# Patient Record
Sex: Female | Born: 1950 | Race: Black or African American | Hispanic: No | Smoking: Former smoker
Health system: Southern US, Community
[De-identification: ages and names within clinical notes are randomized; demographics above are authoritative.]

## PROBLEM LIST (undated history)

## (undated) DIAGNOSIS — E7800 Pure hypercholesterolemia, unspecified: Secondary | ICD-10-CM

## (undated) DIAGNOSIS — I6319 Cerebral infarction due to embolism of other precerebral artery: Secondary | ICD-10-CM

## (undated) DIAGNOSIS — IMO0001 Reserved for inherently not codable concepts without codable children: Secondary | ICD-10-CM

## (undated) DIAGNOSIS — N189 Chronic kidney disease, unspecified: Secondary | ICD-10-CM

## (undated) DIAGNOSIS — R609 Edema, unspecified: Secondary | ICD-10-CM

## (undated) DIAGNOSIS — I2510 Atherosclerotic heart disease of native coronary artery without angina pectoris: Secondary | ICD-10-CM

## (undated) DIAGNOSIS — N184 Chronic kidney disease, stage 4 (severe): Secondary | ICD-10-CM

## (undated) DIAGNOSIS — Z952 Presence of prosthetic heart valve: Secondary | ICD-10-CM

## (undated) DIAGNOSIS — I739 Peripheral vascular disease, unspecified: Secondary | ICD-10-CM

## (undated) DIAGNOSIS — I4821 Permanent atrial fibrillation: Secondary | ICD-10-CM

## (undated) DIAGNOSIS — F419 Anxiety disorder, unspecified: Secondary | ICD-10-CM

## (undated) DIAGNOSIS — I7143 Infrarenal abdominal aortic aneurysm, without rupture: Secondary | ICD-10-CM

## (undated) DIAGNOSIS — K219 Gastro-esophageal reflux disease without esophagitis: Secondary | ICD-10-CM

## (undated) DIAGNOSIS — Z8719 Personal history of other diseases of the digestive system: Secondary | ICD-10-CM

## (undated) DIAGNOSIS — Z923 Personal history of irradiation: Secondary | ICD-10-CM

## (undated) DIAGNOSIS — I719 Aortic aneurysm of unspecified site, without rupture: Secondary | ICD-10-CM

## (undated) DIAGNOSIS — D631 Anemia in chronic kidney disease: Secondary | ICD-10-CM

## (undated) DIAGNOSIS — G4730 Sleep apnea, unspecified: Secondary | ICD-10-CM

## (undated) DIAGNOSIS — M109 Gout, unspecified: Secondary | ICD-10-CM

## (undated) DIAGNOSIS — Z7901 Long term (current) use of anticoagulants: Secondary | ICD-10-CM

## (undated) DIAGNOSIS — R42 Dizziness and giddiness: Secondary | ICD-10-CM

## (undated) DIAGNOSIS — I351 Nonrheumatic aortic (valve) insufficiency: Secondary | ICD-10-CM

## (undated) DIAGNOSIS — I700 Atherosclerosis of aorta: Secondary | ICD-10-CM

## (undated) DIAGNOSIS — M1990 Unspecified osteoarthritis, unspecified site: Secondary | ICD-10-CM

## (undated) DIAGNOSIS — I10 Essential (primary) hypertension: Secondary | ICD-10-CM

## (undated) DIAGNOSIS — I714 Abdominal aortic aneurysm, without rupture: Secondary | ICD-10-CM

## (undated) DIAGNOSIS — I5030 Unspecified diastolic (congestive) heart failure: Secondary | ICD-10-CM

## (undated) DIAGNOSIS — I723 Aneurysm of iliac artery: Secondary | ICD-10-CM

## (undated) HISTORY — PX: REPLACEMENT TOTAL KNEE BILATERAL: SUR1225

## (undated) HISTORY — PX: ABDOMINAL HYSTERECTOMY: SHX81

## (undated) HISTORY — PX: CARDIAC CATHETERIZATION: SHX172

## (undated) HISTORY — DX: Chronic kidney disease, unspecified: N18.9

## (undated) HISTORY — PX: JOINT REPLACEMENT: SHX530

## (undated) HISTORY — DX: Anemia in chronic kidney disease: D63.1

## (undated) HISTORY — DX: Gastro-esophageal reflux disease without esophagitis: K21.9

## (undated) HISTORY — PX: CARDIAC VALVE REPLACEMENT: SHX585

## (undated) HISTORY — PX: AORTIC VALVE REPLACEMENT: SHX41

## (undated) SURGERY — CARDIOVERSION (CATH LAB)
Anesthesia: Monitor Anesthesia Care

---

## 2002-12-12 DIAGNOSIS — I7103 Dissection of thoracoabdominal aorta: Secondary | ICD-10-CM

## 2002-12-12 HISTORY — DX: Dissection of thoracoabdominal aorta: I71.03

## 2003-06-08 HISTORY — PX: REPAIR OF ACUTE ASCENDING THORACIC AORTIC DISSECTION: SHX6323

## 2003-09-19 HISTORY — PX: AORTIC VALVE REPLACEMENT: SHX41

## 2005-06-11 ENCOUNTER — Emergency Department: Payer: Self-pay | Admitting: Emergency Medicine

## 2005-09-09 ENCOUNTER — Ambulatory Visit: Payer: Self-pay | Admitting: Oncology

## 2005-11-23 ENCOUNTER — Ambulatory Visit: Payer: Self-pay | Admitting: Oncology

## 2006-03-31 ENCOUNTER — Ambulatory Visit: Payer: Self-pay | Admitting: *Deleted

## 2006-04-03 ENCOUNTER — Ambulatory Visit: Payer: Self-pay | Admitting: *Deleted

## 2006-06-25 ENCOUNTER — Emergency Department: Payer: Self-pay | Admitting: Internal Medicine

## 2007-04-24 ENCOUNTER — Ambulatory Visit: Payer: Self-pay | Admitting: Internal Medicine

## 2008-01-15 ENCOUNTER — Ambulatory Visit: Payer: Self-pay | Admitting: Internal Medicine

## 2008-05-29 ENCOUNTER — Ambulatory Visit: Payer: Self-pay | Admitting: Internal Medicine

## 2009-06-02 ENCOUNTER — Ambulatory Visit: Payer: Self-pay | Admitting: Internal Medicine

## 2010-07-28 ENCOUNTER — Ambulatory Visit: Payer: Self-pay | Admitting: Internal Medicine

## 2010-09-20 ENCOUNTER — Ambulatory Visit: Payer: Self-pay | Admitting: Specialist

## 2010-09-29 ENCOUNTER — Inpatient Hospital Stay: Payer: Self-pay | Admitting: Specialist

## 2010-11-10 ENCOUNTER — Encounter: Payer: Self-pay | Admitting: Specialist

## 2010-11-11 ENCOUNTER — Encounter: Payer: Self-pay | Admitting: Specialist

## 2010-12-12 ENCOUNTER — Encounter: Payer: Self-pay | Admitting: Specialist

## 2011-02-02 ENCOUNTER — Ambulatory Visit: Payer: Self-pay | Admitting: Gastroenterology

## 2011-09-13 ENCOUNTER — Ambulatory Visit: Payer: Self-pay | Admitting: Internal Medicine

## 2012-02-28 ENCOUNTER — Ambulatory Visit: Payer: Self-pay | Admitting: Specialist

## 2012-02-28 LAB — PROTIME-INR
INR: 1.8
Prothrombin Time: 21.6 secs — ABNORMAL HIGH (ref 11.5–14.7)

## 2012-02-28 LAB — BASIC METABOLIC PANEL
Anion Gap: 10 (ref 7–16)
BUN: 31 mg/dL — ABNORMAL HIGH (ref 7–18)
Calcium, Total: 9.4 mg/dL (ref 8.5–10.1)
Chloride: 107 mmol/L (ref 98–107)
Co2: 26 mmol/L (ref 21–32)
Creatinine: 1.23 mg/dL (ref 0.60–1.30)
EGFR (African American): 57 — ABNORMAL LOW

## 2012-02-28 LAB — URINALYSIS, COMPLETE
Blood: NEGATIVE
Ketone: NEGATIVE
Ph: 5 (ref 4.5–8.0)
Protein: NEGATIVE
RBC,UR: <1 /HPF (ref 0–5)
Specific Gravity: 1.014 (ref 1.003–1.030)
WBC UR: <1 /HPF (ref 0–5)

## 2012-02-28 LAB — CBC
HCT: 32.2 % — ABNORMAL LOW (ref 35.0–47.0)
HGB: 10.3 g/dL — ABNORMAL LOW (ref 12.0–16.0)
MCH: 27.7 pg (ref 26.0–34.0)
MCHC: 31.8 g/dL — ABNORMAL LOW (ref 32.0–36.0)
MCV: 87 fL (ref 80–100)
RBC: 3.7 10*6/uL — ABNORMAL LOW (ref 3.80–5.20)
WBC: 4.9 10*3/uL (ref 3.6–11.0)

## 2012-02-28 LAB — MRSA PCR SCREENING

## 2012-03-06 ENCOUNTER — Inpatient Hospital Stay: Payer: Self-pay | Admitting: Specialist

## 2012-03-07 LAB — URINALYSIS, COMPLETE
Bacteria: NONE SEEN
Ketone: NEGATIVE
Leukocyte Esterase: NEGATIVE
Ph: 5 (ref 4.5–8.0)
Protein: NEGATIVE
RBC,UR: 50 /HPF (ref 0–5)
Specific Gravity: 1.013 (ref 1.003–1.030)
Squamous Epithelial: NONE SEEN

## 2012-03-07 LAB — CBC WITH DIFFERENTIAL/PLATELET
Basophil %: 0.6 %
Eosinophil #: 0 10*3/uL (ref 0.0–0.7)
Eosinophil %: 0.2 %
Lymphocyte #: 0.9 10*3/uL — ABNORMAL LOW (ref 1.0–3.6)
MCH: 27.8 pg (ref 26.0–34.0)
MCV: 87 fL (ref 80–100)
Neutrophil #: 7.7 10*3/uL — ABNORMAL HIGH (ref 1.4–6.5)
Neutrophil %: 79.2 %
WBC: 9.7 10*3/uL (ref 3.6–11.0)

## 2012-03-07 LAB — BASIC METABOLIC PANEL
Anion Gap: 13 (ref 7–16)
Co2: 22 mmol/L (ref 21–32)
EGFR (African American): 27 — ABNORMAL LOW
Sodium: 140 mmol/L (ref 136–145)

## 2012-03-07 LAB — PROTIME-INR
INR: 1.1
Prothrombin Time: 14.4 secs (ref 11.5–14.7)

## 2012-03-08 LAB — HEMOGLOBIN: HGB: 7.4 g/dL — ABNORMAL LOW (ref 12.0–16.0)

## 2012-03-08 LAB — PROTIME-INR
INR: 1.2
Prothrombin Time: 16 secs — ABNORMAL HIGH (ref 11.5–14.7)

## 2012-03-08 LAB — BASIC METABOLIC PANEL
Anion Gap: 12 (ref 7–16)
BUN: 32 mg/dL — ABNORMAL HIGH (ref 7–18)
Calcium, Total: 8.2 mg/dL — ABNORMAL LOW (ref 8.5–10.1)
Chloride: 103 mmol/L (ref 98–107)
Glucose: 107 mg/dL — ABNORMAL HIGH (ref 65–99)
Osmolality: 279 (ref 275–301)
Potassium: 3.9 mmol/L (ref 3.5–5.1)
Sodium: 136 mmol/L (ref 136–145)

## 2012-03-09 LAB — BASIC METABOLIC PANEL
Anion Gap: 11 (ref 7–16)
BUN: 23 mg/dL — ABNORMAL HIGH (ref 7–18)
Calcium, Total: 8.6 mg/dL (ref 8.5–10.1)
Chloride: 103 mmol/L (ref 98–107)
Co2: 23 mmol/L (ref 21–32)
EGFR (African American): 51 — ABNORMAL LOW
Glucose: 94 mg/dL (ref 65–99)
Osmolality: 277 (ref 275–301)
Potassium: 4.1 mmol/L (ref 3.5–5.1)
Sodium: 137 mmol/L (ref 136–145)

## 2012-03-09 LAB — CBC WITH DIFFERENTIAL/PLATELET
Basophil #: 0 10*3/uL (ref 0.0–0.1)
Basophil %: 0.2 %
Eosinophil %: 0.1 %
HCT: 26.4 % — ABNORMAL LOW (ref 35.0–47.0)
Lymphocyte #: 0.7 10*3/uL — ABNORMAL LOW (ref 1.0–3.6)
Lymphocyte %: 5.5 %
MCH: 29.5 pg (ref 26.0–34.0)
Monocyte #: 1.9 10*3/uL — ABNORMAL HIGH (ref 0.0–0.7)
Neutrophil #: 10.9 10*3/uL — ABNORMAL HIGH (ref 1.4–6.5)
Neutrophil %: 80.2 %
Platelet: 142 10*3/uL — ABNORMAL LOW (ref 150–440)
RBC: 2.97 10*6/uL — ABNORMAL LOW (ref 3.80–5.20)
RDW: 15.5 % — ABNORMAL HIGH (ref 11.5–14.5)
WBC: 13.5 10*3/uL — ABNORMAL HIGH (ref 3.6–11.0)

## 2012-03-09 LAB — PROTIME-INR
INR: 1.3
Prothrombin Time: 16.6 secs — ABNORMAL HIGH (ref 11.5–14.7)

## 2012-03-10 LAB — CBC WITH DIFFERENTIAL/PLATELET
Basophil %: 0 %
Eosinophil #: 0 10*3/uL (ref 0.0–0.7)
HCT: 25.2 % — ABNORMAL LOW (ref 35.0–47.0)
HGB: 8.4 g/dL — ABNORMAL LOW (ref 12.0–16.0)
Lymphocyte %: 6.3 %
MCH: 29.3 pg (ref 26.0–34.0)
MCHC: 33.3 g/dL (ref 32.0–36.0)
Monocyte #: 1.2 10*3/uL — ABNORMAL HIGH (ref 0.0–0.7)
Monocyte %: 9.3 %
Platelet: 179 10*3/uL (ref 150–440)
RBC: 2.86 10*6/uL — ABNORMAL LOW (ref 3.80–5.20)
WBC: 13.2 10*3/uL — ABNORMAL HIGH (ref 3.6–11.0)

## 2012-03-10 LAB — BASIC METABOLIC PANEL
BUN: 23 mg/dL — ABNORMAL HIGH (ref 7–18)
Calcium, Total: 8.8 mg/dL (ref 8.5–10.1)
Co2: 25 mmol/L (ref 21–32)
Creatinine: 1.32 mg/dL — ABNORMAL HIGH (ref 0.60–1.30)
EGFR (Non-African Amer.): 43 — ABNORMAL LOW
Osmolality: 279 (ref 275–301)
Sodium: 138 mmol/L (ref 136–145)

## 2012-04-04 ENCOUNTER — Encounter: Payer: Self-pay | Admitting: Specialist

## 2012-04-11 ENCOUNTER — Encounter: Payer: Self-pay | Admitting: Specialist

## 2012-09-13 ENCOUNTER — Ambulatory Visit: Payer: Self-pay | Admitting: Internal Medicine

## 2013-01-15 ENCOUNTER — Ambulatory Visit: Payer: Self-pay | Admitting: Cardiovascular Disease

## 2013-01-15 LAB — PROTIME-INR
INR: 2.9
Prothrombin Time: 30.8 secs — ABNORMAL HIGH (ref 11.5–14.7)

## 2013-09-17 ENCOUNTER — Ambulatory Visit: Payer: Self-pay | Admitting: Internal Medicine

## 2013-10-04 ENCOUNTER — Ambulatory Visit: Payer: Self-pay | Admitting: Internal Medicine

## 2014-09-05 ENCOUNTER — Ambulatory Visit: Payer: Self-pay | Admitting: Internal Medicine

## 2014-09-11 ENCOUNTER — Ambulatory Visit: Payer: Self-pay | Admitting: Internal Medicine

## 2015-03-17 ENCOUNTER — Ambulatory Visit
Admit: 2015-03-17 | Disposition: A | Discharge status: Home / Self Care | Payer: Self-pay | Attending: Internal Medicine | Admitting: Internal Medicine

## 2015-04-05 NOTE — Discharge Summary (Signed)
PATIENT NAME:  Jeanette Romero, Jeanette Romero MR#:  [id number] DATE OF BIRTH:  June 07, 1951  DATE OF ADMISSION:  03/06/2012 DATE OF DISCHARGE:  03/11/2012  FINAL DIAGNOSES:  1. Osteoarthritis, right knee. 2. 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  3. Hypertension.  4. Lower extremity stent.  OPERATIONS: 03/06/2012 cemented DePuy rotating platform LCS total knee replacement.   COMPLICATIONS: None.   CONSULTATIONS: [facility] for low blood pressure postoperatively.   DISCHARGE MEDICATIONS:  1. Home medications as prior to admission except for Warfarin at 4 milligrams daily.  2. Norco 5/325 q.4-6h. p.r.n. pain   HISTORY OF PRESENT ILLNESS: The patient is a 64 year old female with advanced osteoarthritis of the right knee. She had a previous left total knee replacement. The arthritis progressed in the right knee despite bracing and injections to the point where she had constant daily pain which interfered with her activities greatly. She wished to proceed with knee replacement surgery. The patient's cardiologist Dr. Neoma Laming arrange for her to stop her Coumadin five days prior to surgery and placed her on Lovenox as a bridge prior to surgery.   PAST MEDICAL HISTORY: Illnesses as above.   MEDICATIONS: Prehospital medications included warfarin 5 mg daily, Vicodin for pain, Lipitor 40 mg daily, clonidine 0.3 mg t.i.d., Celebrex 200 mg orally daily, benazepril 1 tablet daily, atenolol 25 mg daily, 81 mg aspirin 1 daily, amlodipine 10 mg daily, alprazolam 0.25 mg b.i.d.   ALLERGIES: None.  REVIEW OF SYSTEMS: Unremarkable.   FAMILY HISTORY: Unremarkable.   SOCIAL HISTORY: The patient lives with her husband. She does not smoke or drink. She is unemployed.   PHYSICAL EXAMINATION: The patient was alert and cooperative. She had pain in the medial side of the right knee with motion from 5 to 95 degrees. She had minimal instability. Neurovascular status was good. The left knee showed  good motion without pain and good stability.   LABORATORY DATA: INR was 1.1 on the date of admission.   HOSPITAL COURSE: The patient underwent surgery on 03/06/2012 under general anesthesia. She had been given Lovenox within 12 hours of the surgery and anesthesia did not feel it was safe to perform a spinal. Post surgery she had no complications from surgery. Postoperatively she did well. She did have a somewhat low blood pressure and [facility]Doc saw her. She responded to increased fluids and remained stable. The BUN and creatinine remained stable and were 23 and 1.32 on discharge which was virtually the same as her preoperative testing. Her hemoglobin was 8.4 on discharge. She had 2 units of blood transfused during the hospitalization. Her Coumadin was restarted and her INR was 2.1 on the day of discharge so she does not need any Lovenox upon discharge. She is to get home physical therapy and be partial weight-bearing. She will be seen in my office in 10 days to two weeks.   ____________________________ Park Breed, MD hem:rbg D: 03/21/2012 16:05:14 ET T: 03/22/2012 11:05:45 ET JOB#: [id number]  cc: Park Breed, MD, <Dictator> [name], MD Park Breed MD ELECTRONICALLY SIGNED 03/22/2012 12:27

## 2015-04-05 NOTE — Consult Note (Signed)
PATIENT NAME:  Jeanette Romero, Jeanette Romero MR#:  [id number] DATE OF BIRTH:  [birth year]  DATE OF CONSULTATION:  03/07/2012  REFERRING PHYSICIAN:  Earnestine Leys, MD CONSULTING PHYSICIAN:  Mena Pauls, MD  PRIMARY CARE PHYSICIAN: Dr. Elijio Miles. CARDIOLOGIST: Dr. Neoma Laming.   REASON FOR CONSULTATION:  Low blood pressure, medical management.   CHIEF COMPLAINT:  "My right foot is numb."   HISTORY OF PRESENT ILLNESS: A 64 year old female who has history of hypertension, hyperlipidemia, and obesity. She had aortic valve replacement in 2004 with St. Jude aortic valve and repair of aortic dissection. She had a right total knee replacement done on 03/06/2012. Medicine consult was called in because her blood pressure has been running low in the range of 99 to 89 systolic. She usually has high blood pressure. Her usual blood pressure runs in the range of 132/70. She also has decreased urinary output, about 350 milliliters in the last twelve hours. She is mainly complaining of her right lower extremity being numb, but she denies any chest pain, shortness of breath, any palpitation, nausea, vomiting, or abdominal pain. She is also feeling thirsty. She was given a 500 milliliters normal saline bolus and her blood pressure has improved to 115/69. She denies any dizziness or lightheadedness.   REVIEW OF SYSTEMS: CONSTITUTIONAL: She denies any fever. HEENT: No acute change in vision. No headache. No dizziness. RESPIRATORY: No cough. No dyspnea. No chest pain. No palpitations or syncope. GASTROINTESTINAL: No nausea, vomiting, abdominal pain. No gastrointestinal bleed. No thyroid problems. HEMATOLOGIC: No anemia. INTEGUMENT: No rash. She mainly is complaining of knee pain because of postoperative knee pain. She has numbness of the right lower extremity. No anxiety or depression.   PAST MEDICAL HISTORY:  1. Hypertension. 2. Hyperlipidemia. 3. History of aortic valve replacement St. Jude aortic valve. 4. Repair of aortic  dissection in June 2004 at [facility]. 5. Aortic regurgitation.   PAST SURGICAL HISTORY:  1. Left total knee replacement in 2011, now a right total knee replacement. 2. Hysterectomy.  3. Aortic valve replacement and aortic dissection.   ALLERGIES TO MEDICATIONS: None.   MEDICATIONS:  1. Alprazolam 0.25 mg twice a day.  2. Amlodipine 10 mg daily.  3. Aspirin 81 mg daily.  4. Atenolol 25 mg daily, recently decreased. 5. Benazepril/hydrochlorothiazide 20/12.5 daily.  6. Celebrex 200 mg daily. 7. Clonidine 0.2 mg 3 times a day.  8. Lipitor 40 mg daily.  9. Warfarin 5 mg daily.   SOCIAL HISTORY: She lives with her husband. No smoking or alcohol use.   FAMILY HISTORY: Her brother has history of prostate cancer.   PHYSICAL EXAMINATION:  VITAL SIGNS: Temperature 99, heart rate 63, respiratory rate 20, blood pressure 86/55 to 98/60, saturating 98% on room air. Post bolus, her blood pressure is 107/72 to 115/69.   GENERAL: This is a middle-aged obese African American female who IS tolerating physical therapy at this time. No acute distress, not dizzy.   HEENT: Bilateral pupils are equal. Extraocular muscles intact. No scleral icterus. No conjunctivitis. Oral mucosa is slightly dry and pale.   NECK: No thyroid tenderness, enlargement or nodule. Neck is supple. No masses, nontender. No adenopathy. No JVD. No carotid bruit.   CHEST: Bilateral breath sounds are clear. No wheeze. Normal effort. No respiratory distress.   HEART: Heart sounds are mechanical. There is a murmur. Peripheral pulses 1+. There is lower extremity edema, more on the right side postoperatively.   ABDOMEN: Soft, nontender, and decreased bowel sounds. No hepatosplenomegaly. No bruit. No masses.  RECTAL: Deferred.   NEUROLOGIC: She is awake, alert, oriented to time, place, and person. Cranial nerves are intact. She is ambulating. She is status post right total knee placement. She has a drain there and she is complaining of  numbness of the right leg.   LABORATORY, RADIOLOGICAL AND DIAGNOSTIC DATA: White count 9.7, hemoglobin 8.5, platelet count 168,000. BMP: Sodium 140, potassium 4.5, BUN 33, creatinine 2.34, CO2 of 22. Her creatinine was normal at 1.23 on 02/28/2012. INR 1.1 today. The patient had a nuclear stress test preoperatively on 02/23/2012 which shows stress test with normal study, has a mild fixed anterior defect  with normal ejection fraction. She had an echocardiogram done which showed moderately dilated aortic root in ascending aorta, moderately dilated left and right atrium, normal LV function, normal wall motion, moderate left ventricular hypertrophy, status post aortic valve replacement functioning normally. Her EKG preoperatively was sinus bradycardia.   IMPRESSION:  1. Postoperative hypotension.  2. Acute renal failure, most likely prerenal with oliguria. 3. Postoperative anemia. 4. 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 5. Hyperlipidemia. 6. Obesity. 7. Status post right total knee replacement postoperative day one.   PLAN: This is a 64 year old female who is status post right total knee arthroplasty postoperative day one with history of hypertension and hyperlipidemia. She had history of aortic valve replacement. She was on Coumadin preoperatively that was stopped and she was getting a Lovenox bridge. Currently, she is started back on Coumadin, but her INR is subtherapeutic. She needs to be on therapeutic doses of Lovenox until her INR becomes therapeutic, so I am going to increase the Lovenox to a therapeutic dose. She is just on 30 b.i.d. which is her deep vein thrombosis prophylaxis dose. She is hypotensive postoperatively. Her blood pressure is running 90 systolic. Her baseline blood pressure is around 132/70. She is also oliguric. She is behind on fluids. I am going to hold all antihypertensives at this time. I have given her 500 mL normal saline bolus and I am  going to increase her fluid to half-normal saline at 125 an hour. Her urine output needs to be monitored. We can restart her blood pressure medications gradually if her blood pressure starts rising up. Her renal function has also worsened. Her creatinine went up to 2.3. She had a normal creatinine of 1.23 on 02/28/2012. We will adjust her Lovenox as per creatinine clearance. The patient had nuclear stress test which was negative preoperatively. Echo function showed a normal LV function with significant valve abnormality.   Thanks for the consultation. Will continue to follow the patient.   TIME SPENT WITH CONSULTATION: 50 minutes.     ____________________________ Mena Pauls, MD ag:ap D: 03/07/2012 15:10:34 ET T: 03/07/2012 16:19:14 ET JOB#: [id number]  cc: Mena Pauls, MD, <Dictator> Sheikh A. Elijio Miles, MD [name], MD Mena Pauls MD ELECTRONICALLY SIGNED 04/02/2012 12:09

## 2015-04-05 NOTE — Consult Note (Signed)
Brief Consult Note: Diagnosis: Post op hypotension with h/o HTN , post op Anemia, Acute renal failure, h/o aortic valve replacement with st judes valave, s/p right TKA  POD 1.   Patient was seen by consultant.   Consult note dictated.   Orders entered.   Comments: bp running around 90 systolic, will give [id number] ml NS bolus, post bolus bp improved, pt also oliguric, renal function worsened, had normal renal function on 3/19 now creat worsened to 2.34, hold all antihypertensives, advance fluids, monitor urine output,  pt has mechanical aortic valve, has been restarted on coumadin, needs therapeutic dose lovenox until INR therapeutic , increase lovenox to 1mg /kg daily dosed to creat clearance if bp improves, gradually reintroduce bp meds f/u Hb , bmp, check ua.  Electronic Signatures: Mena Pauls (MD)  (Signed 27-Mar-13 15:16)  Authored: Brief Consult Note   Last Updated: 27-Mar-13 15:16 by Mena Pauls (MD)

## 2015-04-05 NOTE — H&P (Signed)
    Subjective/Chief Complaint Right knee pain    History of Present Illness 64 year old female has had progressive osteoarthritis of the right knee for several years.  Has failed injections, exercise, rest and has dialy pain and difficulty with activities. On coumadin for aortic valve replacement.  Had successful left total knee replacement 2 years ago.  Requests surgery. Risks and benefits of surgery were discussed at length including but not limited to infection, non union, nerve or blood vessed damage, non union, need for repeat surgery, blood clots and lung emboli, and death.Jeanette Romero by her cardiologist, Dr Chancy Milroy.    Primary Physician Donne Anon   Past Med/Surgical Hx:  HTN:   aortic aneursm thoracic,states spilt:   leg stent:   artifical valve:   ALLERGIES:  No Known Allergies:   HOME MEDICATIONS: Medication Instructions Status  aspirin 81 mg oral enteric coated capsule 1  orally once a day (in the morning)  Active  clonidine 0.3 mg oral tablet 1 tab(s) orally 3 times a day  Active  Lipitor 40 mg oral tablet 1 tab(s) orally once a day (at bedtime)  Active  amlodipine 10 mg oral tablet 1 tab(s) orally once a day (in the morning)  Active  Celebrex 200 mg oral capsule 1 cap(s) orally once a day (in the morning)  Active  alprazolam 0.25 mg oral tablet 1 tab(s) orally 2 times a day  Active  warfarin 5 mg oral tablet 1 tab(s) orally once a day (in the evening)  Active  atenolol 25mg  1 tab(s) orally once a day (in the morning) Active  benazepril-hydrochlorothiazide 20 mg-12.5 mg oral tablet 1 tab(s) orally once a day (in the morning) Active   Family and Social History:   Family History Non-Contributory    Social History negative tobacco, negative ETOH    Place of Living Home   Review of Systems:   Fever/Chills No    Cough No    Sputum No    Abdominal Pain No   Physical Exam:   GEN WD, WN    HEENT pink conjunctivae    NECK supple    RESP normal resp effort    CARD  regular rate    ABD denies tenderness    LYMPH negative neck    EXTR negative edema, Right knee in varus with pain medial joint line.  range of motion 5-90*.  circulation/sensation/motor function good and skin intact.    SKIN normal to palpation    NEURO motor/sensory function intact    PSYCH alert, A+O to time, place, person     Assessment/Admission Diagnosis Advanced osteoarthritis right knee    Plan Right total knee replacement   Electronic Signatures: Park Breed (MD)  (Signed 25-Mar-13 18:58)  Authored: CHIEF COMPLAINT and HISTORY, PAST MEDICAL/SURGIAL HISTORY, ALLERGIES, HOME MEDICATIONS, FAMILY AND SOCIAL HISTORY, REVIEW OF SYSTEMS, PHYSICAL EXAM, ASSESSMENT AND PLAN   Last Updated: 25-Mar-13 18:58 by Park Breed (MD)

## 2015-04-05 NOTE — Op Note (Signed)
PATIENT NAME:  Jeanette Romero, Jeanette Romero MR#:  [id number] DATE OF BIRTH:  1951/09/28  DATE OF PROCEDURE:  03/06/2012  PREOPERATIVE DIAGNOSIS: Advanced osteoarthritis right knee with severe varus deformity.Marland Kitchen   POSTOPERATIVE DIAGNOSIS: Advanced osteoarthritis right knee with severe varus deformity.   PROCEDURE: Cemented [place] LCS rotating platform total knee replacement (standard plus femur/patella, #3 keeled tibia, 15- mm polyethylene spacer).   SURGEON: Park Breed, M.D.   ASSISTANT: Christophe Louis, M.D.   ANESTHESIA: General endotracheal.   COMPLICATIONS: None.   DRAINS: Two Autovac.   ESTIMATED BLOOD LOSS: Minimal.   REPLACEMENT: None.   DESCRIPTION OF PROCEDURE: The patient was brought to the operating room where she underwent satisfactory general endotracheal anesthesia in the supine position. A spinal was not done because she had received bridging Lovenox less than 24 hours previously. The right leg was prepped and draped in sterile fashion and an Esmarch applied. The tourniquet was inflated to 350 mmHg. Tourniquet time was 116 minutes. An anterior midline longitudinal incision was made and dissection carried out sharply through subcutaneous tissue. Medial arthrotomy was carried out and complete soft tissue resection was carried out. The proximal tibial alignment jig was inserted and pinned in place. The proximal tibial cut was made taking much more bone laterally than medially since she had erosive changes medially. The distal femur was measured as 2-3/4 inches and a standard plus prosthesis was chosen. The centering hole was made. The ligaments were balanced prior to inserting the initial femoral block. Medial release was carried out carefully.  The anterior cutting block was put back in place and the rotation guide inserted with a 5-mm shim. This provided good stability and alignment was excellent. The cutting blocks were pinned in place and the anterior and posterior cuts were made  without notching the femur at all. The distal femoral cutting guide, 4 degrees valgus, was inserted and pinned in place and the distal cuts made. This allowed for excellent extension gap of 15 mm and the flexion gap was quite stable at 15 mm. The finishing guide was applied to the femur and the cuts made. The tibia was drilled and the #3 trial inserted. The trial poly was put in place and the distal trial femur was put in place and the knee articulated very well. She had excellent motion and good stability. The patella was cut and drilled. Trial was inserted and this tracked well. The trials were removed while cement was mixed. The knee was thoroughly irrigated and final debridement carried out. The knee was dried thoroughly and a #3 keeled tibia was cemented in place along with a standard plus femur and standard plus patella. The 15-mm polyethylene was inserted and excess cement was removed. The cement was then allowed to harden for 10 minutes in extension. Final  irrigation was carried out. 0.5% Marcaine with epinephrine, Toradol, and morphine was injected into the soft tissues throughout the knee. Two Autovacs were inserted. Thrombin spray was applied to the tissues as well. The capsule was closed with #2 Ortho cord.  The subcutaneous tissue was closed with 0 and 2-0 Vicryl. The skin was closed with staples. Dry sterile dressing, Polar Care, and knee immobilizer were applied. The Autovac was activated. The tourniquet was deflated with good return of blood flow to the foot. Prior to applying dressing, TENS pads were also applied. The patient was awakened and taken to recovery in good condition.    ____________________________ Park Breed, MD hem:bjt D: 03/06/2012 10:36:23 ET T: 03/06/2012 11:44:31 ET  JOB#: [id number]  cc: Park Breed, MD, <Dictator> Park Breed MD ELECTRONICALLY SIGNED 03/07/2012 11:03

## 2015-05-05 ENCOUNTER — Ambulatory Visit: Payer: Medicare Other | Admitting: Anesthesiology

## 2015-05-05 ENCOUNTER — Inpatient Hospital Stay: Admission: RE | Admit: 2015-05-05 | Payer: Medicare Other | Source: Ambulatory Visit | Admitting: Cardiovascular Disease

## 2015-05-05 ENCOUNTER — Encounter: Payer: Self-pay | Admitting: *Deleted

## 2015-05-05 ENCOUNTER — Inpatient Hospital Stay
Admission: RE | Admit: 2015-05-05 | Discharge: 2015-05-05 | Disposition: A | Discharge status: Home / Self Care | Payer: Medicare Other | Source: Ambulatory Visit | Attending: Physician Assistant | Admitting: Physician Assistant

## 2015-05-05 ENCOUNTER — Encounter
Admission: RE | Disposition: A | Discharge status: Home / Self Care | Payer: Self-pay | Source: Ambulatory Visit | Attending: Internal Medicine

## 2015-05-05 ENCOUNTER — Inpatient Hospital Stay
Admission: RE | Admit: 2015-05-05 | Discharge: 2015-05-08 | DRG: 287 | Disposition: A | Discharge status: Home / Self Care | Payer: Medicare Other | Source: Ambulatory Visit | Attending: Internal Medicine | Admitting: Internal Medicine

## 2015-05-05 DIAGNOSIS — I129 Hypertensive chronic kidney disease with stage 1 through stage 4 chronic kidney disease, or unspecified chronic kidney disease: Secondary | ICD-10-CM | POA: Diagnosis present

## 2015-05-05 DIAGNOSIS — E780 Pure hypercholesterolemia: Secondary | ICD-10-CM | POA: Diagnosis present

## 2015-05-05 DIAGNOSIS — E785 Hyperlipidemia, unspecified: Secondary | ICD-10-CM | POA: Diagnosis present

## 2015-05-05 DIAGNOSIS — Z8249 Family history of ischemic heart disease and other diseases of the circulatory system: Secondary | ICD-10-CM

## 2015-05-05 DIAGNOSIS — Z7901 Long term (current) use of anticoagulants: Secondary | ICD-10-CM | POA: Diagnosis not present

## 2015-05-05 DIAGNOSIS — I482 Chronic atrial fibrillation: Secondary | ICD-10-CM | POA: Diagnosis present

## 2015-05-05 DIAGNOSIS — M109 Gout, unspecified: Secondary | ICD-10-CM | POA: Diagnosis present

## 2015-05-05 DIAGNOSIS — I351 Nonrheumatic aortic (valve) insufficiency: Secondary | ICD-10-CM | POA: Diagnosis present

## 2015-05-05 DIAGNOSIS — N183 Chronic kidney disease, stage 3 (moderate): Secondary | ICD-10-CM | POA: Diagnosis present

## 2015-05-05 DIAGNOSIS — Z952 Presence of prosthetic heart valve: Secondary | ICD-10-CM

## 2015-05-05 DIAGNOSIS — R600 Localized edema: Secondary | ICD-10-CM | POA: Diagnosis present

## 2015-05-05 DIAGNOSIS — I25110 Atherosclerotic heart disease of native coronary artery with unstable angina pectoris: Secondary | ICD-10-CM | POA: Diagnosis present

## 2015-05-05 DIAGNOSIS — Z87891 Personal history of nicotine dependence: Secondary | ICD-10-CM

## 2015-05-05 DIAGNOSIS — Z7982 Long term (current) use of aspirin: Secondary | ICD-10-CM | POA: Diagnosis not present

## 2015-05-05 DIAGNOSIS — R001 Bradycardia, unspecified: Principal | ICD-10-CM | POA: Diagnosis present

## 2015-05-05 HISTORY — DX: Atherosclerotic heart disease of native coronary artery without angina pectoris: I25.10

## 2015-05-05 HISTORY — DX: Chronic kidney disease, unspecified: N18.9

## 2015-05-05 HISTORY — DX: Anxiety disorder, unspecified: F41.9

## 2015-05-05 HISTORY — PX: ELECTROPHYSIOLOGIC STUDY: SHX172A

## 2015-05-05 HISTORY — DX: Nonrheumatic aortic (valve) insufficiency: I35.1

## 2015-05-05 HISTORY — DX: Pure hypercholesterolemia, unspecified: E78.00

## 2015-05-05 HISTORY — DX: Reserved for inherently not codable concepts without codable children: IMO0001

## 2015-05-05 HISTORY — DX: Edema, unspecified: R60.9

## 2015-05-05 HISTORY — DX: Dizziness and giddiness: R42

## 2015-05-05 HISTORY — DX: Gout, unspecified: M10.9

## 2015-05-05 HISTORY — DX: Essential (primary) hypertension: I10

## 2015-05-05 LAB — TSH: TSH: 2.397 u[IU]/mL (ref 0.350–4.500)

## 2015-05-05 LAB — TROPONIN I
Troponin I: <0.03 ng/mL (ref <0.031)
Troponin I: <0.03 ng/mL (ref <0.031)

## 2015-05-05 LAB — PROTIME-INR
INR: 2.34
PROTHROMBIN TIME: 25.8 s — AB (ref 11.4–15.0)

## 2015-05-05 SURGERY — CARDIOVERSION (CATH LAB)
Anesthesia: General

## 2015-05-05 MED ORDER — ATORVASTATIN CALCIUM 20 MG PO TABS
40.0000 mg | ORAL_TABLET | Freq: Every day | ORAL | Status: DC
  Administered 2015-05-05 – 2015-05-07 (×3): 40 mg via ORAL
  Filled 2015-05-05 (×5): qty 2

## 2015-05-05 MED ORDER — HYDRALAZINE HCL 50 MG PO TABS
50.0000 mg | ORAL_TABLET | Freq: Two times a day (BID) | ORAL | Status: DC
  Administered 2015-05-05 – 2015-05-08 (×5): 50 mg via ORAL
  Filled 2015-05-05 (×6): qty 1

## 2015-05-05 MED ORDER — TRAZODONE HCL 50 MG PO TABS
50.0000 mg | ORAL_TABLET | Freq: Every day | ORAL | Status: DC
  Administered 2015-05-05: 50 mg via ORAL
  Filled 2015-05-05 (×3): qty 1

## 2015-05-05 MED ORDER — PROPOFOL 10 MG/ML IV BOLUS
INTRAVENOUS | Status: DC | PRN
  Administered 2015-05-05: 90 mg via INTRAVENOUS

## 2015-05-05 MED ORDER — ONDANSETRON HCL 4 MG/2ML IJ SOLN: 4.0000 mg | Freq: Four times a day (QID) | INTRAMUSCULAR | Status: DC | PRN

## 2015-05-05 MED ORDER — SODIUM CHLORIDE 0.9 % IV SOLN
INTRAVENOUS | Status: DC
  Administered 2015-05-05 (×2): via INTRAVENOUS

## 2015-05-05 MED ORDER — AMLODIPINE BESYLATE 10 MG PO TABS
10.0000 mg | ORAL_TABLET | Freq: Every day | ORAL | Status: DC
  Administered 2015-05-06 – 2015-05-08 (×3): 10 mg via ORAL
  Filled 2015-05-05 (×3): qty 1

## 2015-05-05 MED ORDER — ACETAMINOPHEN 325 MG PO TABS: 650.0000 mg | ORAL_TABLET | Freq: Four times a day (QID) | ORAL | Status: DC | PRN

## 2015-05-05 MED ORDER — SODIUM CHLORIDE 0.9 % IV SOLN
INTRAVENOUS | Status: DC
  Administered 2015-05-05 – 2015-05-07 (×3): via INTRAVENOUS

## 2015-05-05 MED ORDER — SODIUM CHLORIDE 0.9 % IJ SOLN
3.0000 mL | Freq: Two times a day (BID) | INTRAMUSCULAR | Status: DC
  Administered 2015-05-06: 3 mL via INTRAVENOUS

## 2015-05-05 MED ORDER — HYDROCODONE-ACETAMINOPHEN 5-325 MG PO TABS: 1.0000 | ORAL_TABLET | ORAL | Status: DC | PRN

## 2015-05-05 MED ORDER — ONDANSETRON HCL 4 MG PO TABS: 4.0000 mg | ORAL_TABLET | Freq: Four times a day (QID) | ORAL | Status: DC | PRN

## 2015-05-05 MED ORDER — ASPIRIN 81 MG PO CHEW
81.0000 mg | CHEWABLE_TABLET | Freq: Every day | ORAL | Status: DC
  Administered 2015-05-06 – 2015-05-08 (×3): 81 mg via ORAL
  Filled 2015-05-05 (×3): qty 1

## 2015-05-05 MED ORDER — ALPRAZOLAM 0.25 MG PO TABS
0.2500 mg | ORAL_TABLET | Freq: Two times a day (BID) | ORAL | Status: DC
  Administered 2015-05-05 – 2015-05-08 (×6): 0.25 mg via ORAL
  Filled 2015-05-05 (×6): qty 1

## 2015-05-05 MED ORDER — ACETAMINOPHEN 650 MG RE SUPP: 650.0000 mg | Freq: Four times a day (QID) | RECTAL | Status: DC | PRN

## 2015-05-05 MED ORDER — ENOXAPARIN SODIUM 100 MG/ML ~~LOC~~ SOLN
1.0000 mg/kg | Freq: Two times a day (BID) | SUBCUTANEOUS | Status: AC
  Administered 2015-05-05 – 2015-05-06 (×4): 100 mg via SUBCUTANEOUS
  Filled 2015-05-05 (×8): qty 1

## 2015-05-05 MED ORDER — BENAZEPRIL HCL 20 MG PO TABS
40.0000 mg | ORAL_TABLET | Freq: Every day | ORAL | Status: DC
  Administered 2015-05-06 – 2015-05-08 (×3): 40 mg via ORAL
  Filled 2015-05-05 (×3): qty 2

## 2015-05-05 NOTE — Progress Notes (Signed)
Pt awake,drowsy, resting post cardioversion, rate being 34-38, although pt asymptomatic, Dr Humphrey Rolls here and aware of heart rate, with decision to admit pt for telemetry. Pt denies shortness of breath, nor chest pain, family present and aware of pt to be admitted, vss. Post ekg done,

## 2015-05-05 NOTE — Progress Notes (Signed)
Jeanette Romero is a 64 y.o. female  [id number]  Primary Cardiologist: Neoma Laming Reason for Consultation: bradycardia  HPI: Patient had severe bradycardia in the 38-47/min after cardiovesion this morning. She was seen yesterday in office and was having chest pains and is schedulalled for cath Thursday. Coumadin is on hold and is to get lovenox, since has mechanicle aortic valve (St. Jude), along with type A aortic dissection repair. If heart rate remain low will get Dr. Lavera Guise to place PPM.   Review of Systems: no dizziness or syncope   Past Medical History  Diagnosis Date  . Chronic kidney disease   . Coronary artery disease   . Shortness of breath dyspnea   . Dizziness   . Edema   . Hypertension   . Anxiety   . Gout   . Hypercholesterolemia   . Aortic valve regurgitation     Medications Prior to Admission  Medication Sig Dispense Refill  . ALPRAZolam (XANAX) 0.25 MG tablet Take 0.25 mg by mouth 2 (two) times daily.    Marland Kitchen amiodarone (PACERONE) 200 MG tablet Take 200 mg by mouth daily.    Marland Kitchen amLODipine (NORVASC) 10 MG tablet Take 10 mg by mouth daily.    Marland Kitchen aspirin EC 81 MG tablet Take 81 mg by mouth daily.    Marland Kitchen atorvastatin (LIPITOR) 40 MG tablet Take 40 mg by mouth at bedtime.     . benazepril (LOTENSIN) 40 MG tablet Take 40 mg by mouth daily.    . cloNIDine (CATAPRES) 0.3 MG tablet Take 0.3 mg by mouth 3 (three) times daily.    . diphenhydrAMINE (BENADRYL) 25 MG tablet Take 25 mg by mouth at bedtime as needed for sleep.    . hydrALAZINE (APRESOLINE) 25 MG tablet Take 25 mg by mouth 2 (two) times daily.    Marland Kitchen warfarin (COUMADIN) 1 MG tablet Take 0.5 mg by mouth at bedtime.    Marland Kitchen warfarin (COUMADIN) 3 MG tablet Take 3 mg by mouth at bedtime.       . ALPRAZolam  0.25 mg Oral BID  . amLODipine  10 mg Oral Daily  . aspirin  81 mg Oral Daily  . atorvastatin  40 mg Oral Daily  . benazepril  40 mg Oral Daily  . enoxaparin (LOVENOX) injection  1 mg/kg Subcutaneous Q12H   . hydrALAZINE  50 mg Oral BID  . sodium chloride  3 mL Intravenous Q12H  . traZODone  50 mg Oral QHS    Infusions: . sodium chloride      No Known Allergies  History   Social History  . Marital Status: Married    Spouse Name: N/A  . Number of Children: N/A  . Years of Education: N/A   Occupational History  . Not on file.   Social History Main Topics  . Smoking status: Former Research scientist (life sciences)  . Smokeless tobacco: Former Systems developer  . Alcohol Use: No  . Drug Use: Not on file  . Sexual Activity: Not on file   Other Topics Concern  . Not on file   Social History Narrative    Family History  Problem Relation Age of Onset  . Hypertension Mother     PHYSICAL EXAM: Filed Vitals:   05/05/15 1340  BP: 152/59  Pulse: 42  Temp: 97.9 F (36.6 C)  Resp: 18     Intake/Output Summary (Last 24 hours) at 05/05/15 1446 Last data filed at 05/05/15 0847  Gross per 24 hour  Intake  300 ml  Output      0 ml  Net    300 ml    General:  Well appearing. No respiratory difficulty HEENT: normal Neck: supple. no JVD. Carotids 2+ bilat; no bruits. No lymphadenopathy or thryomegaly appreciated. Cor: PMI nondisplaced. Regular rate & rhythm. No rubs, gallops or murmurs. Lungs: clear Abdomen: soft, nontender, nondistended. No hepatosplenomegaly. No bruits or masses. Good bowel sounds. Extremities: no cyanosis, clubbing, rash, edema Neuro: alert & oriented x 3, cranial nerves grossly intact. moves all 4 extremities w/o difficulty. Affect pleasant.  [id number] bradycardia post cath 40/min  Results for orders placed or performed during the hospital encounter of 05/05/15 (from the past 24 hour(s))  Protime-INR     Status: Abnormal   Collection Time: 05/05/15  7:51 AM  Result Value Ref Range   Prothrombin Time 25.8 (H) 11.4 - 15.0 seconds   INR 2.34    No results found.   ASSESSMENT AND PLAN: Unstable angina/Sinus bradycardia post cardioversion for Afib. Hold amiodrone and wean off  clonidine to see if heart rate improves, otherwise get Dr. Lavera Guise to place PPM. Give lovenox and keep coumadin on hold, and get INR daily.  Dawon Troop A

## 2015-05-05 NOTE — Progress Notes (Signed)
Pt resting at this time, Dr Posey Pronto by to see pt for admission, family at bedside. Taking po's without difficulty

## 2015-05-05 NOTE — Progress Notes (Signed)
Alert and oriented. Heart rate has consistently been in the high 30's to 40's. For around one hour the patient's heart rate was in the 60's, currently back in the low 40's. Per specials RN, both cardiology and primary MD aware of heart rate. Patient is asymptomatic and has no complaints. Patient stated she bit her tongue during the cardioversion and requested warm salt water to gargle. Will continue to monitor.

## 2015-05-05 NOTE — Op Note (Signed)
  NAME:  TIFFANYE BOURBEAU   MRN: [id number] DOB:  29-Aug-1951   ADMIT DATE: 05/05/2015  Procedure: Electrical Cardioversion Indications:  Atrial Fibrillation  Procedure Details:  Time Out: Verified patient identification, verified procedure, site/side was marked, verified correct patient position, special equipment/implants available, medications/allergies/relevent history reviewed, required imaging and test results available.    Patient placed on cardiac monitor, pulse oximetry, supplemental oxygen as necessary.  Sedation given: Etomidate Pacer pads placed anterior and posterior chest.  Cardioverted 1 time(s).  Cardioverted at 120J.  Evaluation: Findings: Post procedure EKG shows: NSR Complications: None Patient did tolerate procedure well.  Time Spent Directly with the Patient:  30minutes   [name], M.D. [facility]   05/05/2015 9:02 AM

## 2015-05-05 NOTE — Anesthesia Preprocedure Evaluation (Addendum)
Anesthesia Evaluation  Patient identified by MRN, date of birth, ID band Patient awake    Reviewed: H&P , NPO status , Patient's Chart, lab work & pertinent test results  History of Anesthesia Complications Negative for: history of anesthetic complications  Airway Mallampati: II  TM Distance: >3 FB Neck ROM: full    Dental no notable dental hx. (+) Teeth Intact   Pulmonary shortness of breath and with exertion, former smoker,  Quit in 2004   Pulmonary exam normal       Cardiovascular hypertension, + CAD Normal cardiovascular exam    Neuro/Psych negative neurological ROS  negative psych ROS   GI/Hepatic negative GI ROS, Neg liver ROS,   Endo/Other  negative endocrine ROS  Renal/GU Renal disease  negative genitourinary   Musculoskeletal   Abdominal   Peds negative pediatric ROS (+)  Hematology negative hematology ROS (+)   Anesthesia Other Findings   Reproductive/Obstetrics negative OB ROS                            Anesthesia Physical Anesthesia Plan  ASA: III  Anesthesia Plan: General   Post-op Pain Management:    Induction:   Airway Management Planned:   Additional Equipment:   Intra-op Plan:   Post-operative Plan:   Informed Consent: I have reviewed the patients History and Physical, chart, labs and discussed the procedure including the risks, benefits and alternatives for the proposed anesthesia with the patient or authorized representative who has indicated his/her understanding and acceptance.     Plan Discussed with: Anesthesiologist  Anesthesia Plan Comments:         Anesthesia Quick Evaluation

## 2015-05-05 NOTE — Progress Notes (Signed)
  SUBJECTIVE: Pt tolerated cardiac cath well.    Filed Vitals:   05/07/15 0526 05/07/15 0620 05/07/15 0720 05/07/15 1001  BP: 170/79 139/115 168/76 138/81  Pulse: 103   76  Temp: 98.3 F (36.8 C)     TempSrc: Oral     Resp: 28  20 15   Height:      Weight: 96.48 kg (212 lb 11.2 oz)     SpO2: 100%  98% 96%    Intake/Output Summary (Last 24 hours) at 05/07/15 1014 Last data filed at 05/07/15 0700  Gross per 24 hour  Intake   1200 ml  Output   1800 ml  Net   -600 ml    LABS: Basic Metabolic Panel:  Recent Labs  05/06/15 0359 05/07/15 0349  NA 141 142  K 3.6 3.5  CL 111 110  CO2 24 24  GLUCOSE 110* 90  BUN 21* 14  CREATININE 1.68* 1.35*  CALCIUM 9.1 9.5   Liver Function Tests: No results for input(s): AST, ALT, ALKPHOS, BILITOT, PROT, ALBUMIN in the last 72 hours. No results for input(s): LIPASE, AMYLASE in the last 72 hours. CBC:  Recent Labs  05/06/15 0359  WBC 4.8  HGB 9.8*  HCT 31.7*  MCV 86.9  PLT 190   Cardiac Enzymes:  Recent Labs  05/05/15 1419 05/05/15 1936 05/06/15 0145  TROPONINI <0.03 <0.03 <0.03   BNP: Invalid input(s): POCBNP D-Dimer: No results for input(s): DDIMER in the last 72 hours. Hemoglobin A1C: No results for input(s): HGBA1C in the last 72 hours. Fasting Lipid Panel: No results for input(s): CHOL, HDL, LDLCALC, TRIG, CHOLHDL, LDLDIRECT in the last 72 hours. Thyroid Function Tests:  Recent Labs  05/05/15 1419  TSH 2.397   Anemia Panel: No results for input(s): VITAMINB12, FOLATE, FERRITIN, TIBC, IRON, RETICCTPCT in the last 72 hours.   PHYSICAL EXAM General: Well developed, well nourished, in no acute distress HEENT:  Normocephalic and atramatic Neck:  No JVD.  Lungs: Clear bilaterally to auscultation and percussion. Heart: HRRR . Normal S1 and S2 without gallops or murmurs.  Abdomen: Bowel sounds are positive, abdomen soft and non-tender  Msk:  Back normal, normal gait. Normal strength and tone for  age. Extremities: No clubbing, cyanosis or edema.   Neuro: Alert and oriented X 3. Psych:  Good affect, responds appropriately  TELEMETRY: NSR 90 BPM  ASSESSMENT AND PLAN: Cardiac catheterization showed moderate disease in LAD, RCA and LCX. Advise continuation of pts home lipitor 40mg  daily, asa 81mg . Advise restarting pts amiodarone 200mg  daily and coumadin (pharmacy consult to get pts PT/INR therapeutic). Also advise adding ranexa to pts current regimen.  Active Problems:   Bradycardia    Teri Legacy A, MD, [facility] 05/07/2015 10:14 AM

## 2015-05-05 NOTE — Anesthesia Procedure Notes (Signed)
Procedure Name: MAC Date/Time: 05/05/2015 8:30 AM Performed by: Doreen Salvage Pre-anesthesia Checklist: Patient identified, Suction available, Patient being monitored and Emergency Drugs available Patient Re-evaluated:Patient Re-evaluated prior to inductionOxygen Delivery Method: Nasal cannula

## 2015-05-05 NOTE — H&P (Signed)
[facility] [place] NAME: Jeanette Romero    MR#:  [id number]  DATE OF BIRTH:  June 10, 1951  DATE OF ADMISSION:  05/05/2015  PRIMARY CARE PHYSICIAN: Volanda Napoleon, MD   REQUESTING/REFERRING PHYSICIAN:   CHIEF COMPLAINT: Bradycardia  HISTORY OF PRESENT ILLNESS: Jeanette Romero  is a 64 y.o. female with a known history of  atrial fibrillation, aortic aneurysm, aortic valve replacement was on chronic anticoagulation therapy. Who had an elective cardioversion scheduled today by Dr. Chancy Milroy. Patient in reviewing patient's records she does have history of bradycardia looking at the notes from his office. Patient was noted to be A. fib prior to the cardioversion after the cardioversion her heart rate is dropped into the 30s, therefore cardiology recommended admission to the hospital for further monitoring. Patient also has been having chest pain and is planned to have a cardiac catheter on Thursday. She is not on any beta blockers only medication that she that may be playing a role in her bradycardia is amiodarone as well as clonidine. Patient also has been having intermittent chest pain as well.  She also has chronic lower extremity swelling as well.       PAST MEDICAL HISTORY:   Past Medical History  Diagnosis Date  . Chronic kidney disease   . Coronary artery disease   . Shortness of breath dyspnea   . Dizziness   . Edema   . Hypertension   . Anxiety   . Gout   . Hypercholesterolemia   . Aortic valve regurgitation     PAST SURGICAL HISTORY:  Past Surgical History  Procedure Laterality Date  . Aortic valve replacement    . Cardiac catheterization    . Cardiac valve replacement      SOCIAL HISTORY:  History  Substance Use Topics  . Smoking status: Former Research scientist (life sciences)  . Smokeless tobacco: Former Systems developer  . Alcohol Use: No    FAMILY HISTORY:  Family History  Problem Relation Age of Onset  . Hypertension Mother     DRUG  ALLERGIES: No Known Allergies  REVIEW OF SYSTEMS:   CONSTITUTIONAL: No fever, fatigue or positive weakness.  EYES: No blurred or double vision.  EARS, NOSE, AND THROAT: No tinnitus or ear pain.  RESPIRATORY: No cough, positive shortness of breath, wheezing or hemoptysis.  CARDIOVASCULAR: Positive chest pain, orthopnea, positive edema.  GASTROINTESTINAL: No nausea, vomiting, diarrhea or abdominal pain.  GENITOURINARY: No dysuria, hematuria.  ENDOCRINE: No polyuria, nocturia,  HEMATOLOGY: No anemia, easy bruising or bleeding SKIN: No rash or lesion. MUSCULOSKELETAL: No joint pain or arthritis.   NEUROLOGIC: No tingling, numbness, weakness.  PSYCHIATRY: No anxiety or depression.   MEDICATIONS AT HOME:  Prior to Admission medications   Medication Sig Start Date End Date Taking? Authorizing Provider  ALPRAZolam (XANAX) 0.25 MG tablet Take 0.25 mg by mouth 2 (two) times daily.   Yes Historical Provider, MD  amiodarone (PACERONE) 200 MG tablet Take 200 mg by mouth daily.   Yes Historical Provider, MD  amLODipine (NORVASC) 10 MG tablet Take 10 mg by mouth daily.   Yes Historical Provider, MD  aspirin 81 MG tablet Take 81 mg by mouth daily.   Yes Historical Provider, MD  atorvastatin (LIPITOR) 40 MG tablet Take 40 mg by mouth daily.   Yes Historical Provider, MD  benazepril (LOTENSIN) 40 MG tablet Take 40 mg by mouth daily.   Yes Historical Provider, MD  cloNIDine (CATAPRES) 0.3 MG tablet Take 0.3 mg  by mouth 3 (three) times daily.   Yes Historical Provider, MD  hydrALAZINE (APRESOLINE) 50 MG tablet Take 50 mg by mouth 2 (two) times daily.   Yes Historical Provider, MD  traZODone (DESYREL) 50 MG tablet Take 50 mg by mouth at bedtime.   Yes Historical Provider, MD  warfarin (COUMADIN) 4 MG tablet Take 4 mg by mouth daily.   Yes Historical Provider, MD      PHYSICAL EXAMINATION:   VITAL SIGNS: Blood pressure 135/93, pulse 40, temperature 98 F (36.7 C), temperature source Oral, resp. rate  16, height 5\' 2"  (1.575 m), weight 99.791 kg (220 lb), SpO2 95 %.  GENERAL:  64 y.o.-year-old patient lying in the bed with no acute distress.  EYES: Pupils equal, round, reactive to light and accommodation. No scleral icterus. Extraocular muscles intact.  HEENT: Head atraumatic, normocephalic. Oropharynx and nasopharynx clear.  NECK:  Supple, no jugular venous distention. No thyroid enlargement, no tenderness.  LUNGS: Normal breath sounds bilaterally, no wheezing, rales,rhonchi or crepitation. No use of accessory muscles of respiration.  CARDIOVASCULAR: S1, S2 normal. Positive systolic murmur with a click , rubs, or gallops.  ABDOMEN: Soft, nontender, nondistended. Bowel sounds present. No organomegaly or mass.  EXTREMITIES: 2+ pedal edema, cyanosis, or clubbing.  NEUROLOGIC: Cranial nerves II through XII are intact. Muscle strength 5/5 in all extremities. Sensation intact. Gait not checked.  PSYCHIATRIC: The patient is alert and oriented x 3.  SKIN: No obvious rash, lesion, or ulcer.   LABORATORY PANEL:   CBC No results for input(s): WBC, HGB, HCT, PLT, MCV, MCH, MCHC, RDW, LYMPHSABS, MONOABS, EOSABS, BASOSABS, BANDABS in the last 168 hours.  Invalid input(s): NEUTRABS, BANDSABD ------------------------------------------------------------------------------------------------------------------  Chemistries  No results for input(s): NA, K, CL, CO2, GLUCOSE, BUN, CREATININE, CALCIUM, MG, AST, ALT, ALKPHOS, BILITOT in the last 168 hours.  Invalid input(s): GFRCGP ------------------------------------------------------------------------------------------------------------------ CrCl cannot be calculated (Patient has no serum creatinine result on file.). ------------------------------------------------------------------------------------------------------------------ No results for input(s): TSH, T4TOTAL, T3FREE, THYROIDAB in the last 72 hours.  Invalid input(s): FREET3   Coagulation  profile  Recent Labs Lab 05/05/15 0751  INR 2.34   ------------------------------------------------------------------------------------------------------------------- No results for input(s): DDIMER in the last 72 hours. -------------------------------------------------------------------------------------------------------------------  Cardiac Enzymes No results for input(s): CKMB, TROPONINI, MYOGLOBIN in the last 168 hours.  Invalid input(s): CK ------------------------------------------------------------------------------------------------------------------ Invalid input(s): POCBNP  ---------------------------------------------------------------------------------------------------------------  Urinalysis No results found for: COLORURINE, APPEARANCEUR, LABSPEC, PHURINE, GLUCOSEU, HGBUR, BILIRUBINUR, KETONESUR, PROTEINUR, UROBILINOGEN, NITRITE, LEUKOCYTESUR   RADIOLOGY: No results found.  EKG: Orders placed or performed during the hospital encounter of 05/05/15  . EKG 12-Lead pre-cardioversion  . EKG 12-Lead  . EKG 12-Lead pre-cardioversion  . EKG 12-Lead    IMPRESSION AND PLAN: Patient is a 64 year old African-American female with history of atrial fibrillation which appears to have chronic bradycardia status post cardioversion now with worsening bradycardia.  1. Bradycardia- possibly related to amiodarone therapy which we will hold, clonidine also can cause bradycardia therefore I will hold this as well. Patient is currently asymptomatic. We'll monitor her blood pressure as well as any symptoms from bradycardia.  2 . Chest pain- plan was for cardiac catheterization as outpatient however with patient's bradycardia cardiology is planning to do a cardiac catheterization on Thursday. Her Coumadin will be held she will be on Lovenox for bridging purposes  3.  Aortic valve replacement- Coumadin will be held will be on full dose Lovenox.  4. A. Fib- status post cardioversion,  due to bradycardia hold amiodarone.  5. Hypertension- I will hold  her clonidine and continue hydralazine.  6 . Hyperlipidemia- continue Lipitor as taking at home.        CODE STATUS: Full    TOTAL TIME TAKING CARE OF THIS PATIENT: 55 minutes.    Dustin Flock M.D on 05/05/2015 at 10:06 AM  Between 7am to 6pm - Pager - [phone]  After 6pm go to [email or web address] - password [email or web address] [facility]  Office  [phone]  CC: Primary care physician; Volanda Napoleon, MD

## 2015-05-05 NOTE — Progress Notes (Signed)
Pt remains totally asymptomatic regarding sinus brady 34-36, post cardioversion,bp and vitals remain stable at this time, for hearrt cath on Thursday. Family present. Report given to care nurse on 235 telemetry prior to transfer.

## 2015-05-05 NOTE — Progress Notes (Addendum)
Orders placed for cardiac cath with 5/24 date. Called Dr. Humphrey Rolls to confirm that cardiac cath is to be done on Thursday. MD confirmed and stated to either change the date of the cath orders or discontinue orders and MD will put orders back in for correct date.  MD unable to put new orders at this time, discontinued current cath orders and they will be replaced by MD for Thursday 5/26.

## 2015-05-05 NOTE — Transfer of Care (Signed)
  Immediate Anesthesia Transfer of Care Note  Patient: Jeanette Romero  Procedure(s) Performed: Procedure(s): Cardioversion (N/A)  Patient Location: PACU and Short Stay  Anesthesia Type:General  Level of Consciousness: awake, alert  and oriented  Airway & Oxygen Therapy: Patient Spontanous Breathing and Patient connected to nasal cannula oxygen  Post-op Assessment: Report given to RN and Post -op Vital signs reviewed and stable  Post vital signs: Reviewed and stable  Last Vitals:  Filed Vitals:   05/05/15 0847  BP: 116/57  Pulse:   Temp:   Resp: 16    Complications: No apparent anesthesia complications

## 2015-05-06 ENCOUNTER — Encounter: Payer: Self-pay | Admitting: Cardiovascular Disease

## 2015-05-06 LAB — PROTIME-INR
INR: 2.11
Prothrombin Time: 23.8 seconds — ABNORMAL HIGH (ref 11.4–15.0)

## 2015-05-06 LAB — CBC
HEMATOCRIT: 31.7 % — AB (ref 35.0–47.0)
Hemoglobin: 9.8 g/dL — ABNORMAL LOW (ref 12.0–16.0)
MCH: 26.9 pg (ref 26.0–34.0)
MCHC: 31 g/dL — ABNORMAL LOW (ref 32.0–36.0)
MCV: 86.9 fL (ref 80.0–100.0)
Platelets: 190 10*3/uL (ref 150–440)
RBC: 3.65 MIL/uL — ABNORMAL LOW (ref 3.80–5.20)
RDW: 16.1 % — ABNORMAL HIGH (ref 11.5–14.5)
WBC: 4.8 10*3/uL (ref 3.6–11.0)

## 2015-05-06 LAB — BASIC METABOLIC PANEL
Anion gap: 6 (ref 5–15)
BUN: 21 mg/dL — ABNORMAL HIGH (ref 6–20)
CO2: 24 mmol/L (ref 22–32)
CREATININE: 1.68 mg/dL — AB (ref 0.44–1.00)
Calcium: 9.1 mg/dL (ref 8.9–10.3)
Chloride: 111 mmol/L (ref 101–111)
GFR, EST AFRICAN AMERICAN: 36 mL/min — AB (ref >60)
GFR, EST NON AFRICAN AMERICAN: 31 mL/min — AB (ref >60)
GLUCOSE: 110 mg/dL — AB (ref 65–99)
POTASSIUM: 3.6 mmol/L (ref 3.5–5.1)
Sodium: 141 mmol/L (ref 135–145)

## 2015-05-06 LAB — TROPONIN I

## 2015-05-06 MED ORDER — ASPIRIN 81 MG PO CHEW
81.0000 mg | CHEWABLE_TABLET | ORAL | Status: AC
  Administered 2015-05-07: 81 mg via ORAL
  Filled 2015-05-06: qty 1

## 2015-05-06 MED ORDER — AMIODARONE HCL 200 MG PO TABS
200.0000 mg | ORAL_TABLET | Freq: Every day | ORAL | Status: DC
  Administered 2015-05-06 – 2015-05-08 (×3): 200 mg via ORAL
  Filled 2015-05-06 (×3): qty 1

## 2015-05-06 MED ORDER — MAGIC MOUTHWASH
5.0000 mL | Freq: Three times a day (TID) | ORAL | Status: DC
  Administered 2015-05-06: 5 mL via ORAL
  Filled 2015-05-06: qty 5
  Filled 2015-05-06 (×2): qty 10
  Filled 2015-05-06: qty 5
  Filled 2015-05-06: qty 10
  Filled 2015-05-06: qty 5
  Filled 2015-05-06 (×3): qty 10

## 2015-05-06 MED ORDER — SODIUM CHLORIDE 0.9 % IV SOLN: INTRAVENOUS | Status: DC

## 2015-05-06 MED ORDER — VITAMIN K1 10 MG/ML IJ SOLN
10.0000 mg | Freq: Once | INTRAMUSCULAR | Status: AC
  Administered 2015-05-06: 10 mg via SUBCUTANEOUS
  Filled 2015-05-06: qty 1

## 2015-05-06 MED ORDER — LIDOCAINE VISCOUS 2 % MT SOLN
15.0000 mL | OROMUCOSAL | Status: DC | PRN
  Administered 2015-05-06: 15 mL via OROMUCOSAL
  Filled 2015-05-06 (×3): qty 15

## 2015-05-06 NOTE — Progress Notes (Signed)
VSS. Pt has not reported pain except on her tongue and a mouth wash was ordered. IV was replaced in her R hand. Room air. NSR. Cath scheduled for 5/26. BUN and Cr elevated. INR high so SQ vit K given. Trop neg. Family at the bedside. Pt has no further concerns at this time.

## 2015-05-06 NOTE — Progress Notes (Addendum)
[place] [facility]                                                                                                                                                                                            Patient Demographics   Jeanette Romero, is a 64 y.o. female, DOB - 05/14/1951, [id number]  Admit date - 05/05/2015   Admitting Physician [name], MD  Outpatient Primary MD for the patient is TEJAN-SIE, Clotilde Dieter, MD  LOS - 1  Patient presented admitted with bradycardia, heart rate is improved today. Denies any weakness some shortness of breath.      Review of Systems:   CONSTITUTIONAL: No documented fever. No fatigue, weakness. No weight gain, no weight loss.  EYES: No blurry or double vision.  ENT: No tinnitus. No postnasal drip. No redness of the oropharynx.  RESPIRATORY: No cough, no wheeze, no hemoptysis. Positive dyspnea.  CARDIOVASCULAR: Positive chest pain. No orthopnea. No palpitations. No syncope.  GASTROINTESTINAL: No nausea, no vomiting or diarrhea. No abdominal pain. No melena or hematochezia.  GENITOURINARY: No dysuria or hematuria.  ENDOCRINE: No polyuria or nocturia. No heat or cold intolerance.  HEMATOLOGY: No anemia. No bruising. No bleeding.  INTEGUMENTARY: No rashes. No lesions.  MUSCULOSKELETAL: No arthritis. No swelling. No gout.  NEUROLOGIC: No numbness, tingling, or ataxia. No seizure-type activity.  PSYCHIATRIC: No anxiety. No insomnia. No ADD.    Vitals:   Filed Vitals:   05/05/15 2108 05/06/15 0546 05/06/15 0721 05/06/15 1112  BP: 136/53 135/50 144/68 131/60  Pulse: 61 77 69 57  Temp:    98.5 F (36.9 C)  TempSrc:    Oral  Resp: 18 18 18 16   Height:      Weight:      SpO2: 99% 94% 100% 97%    Wt Readings from Last 3 Encounters:  05/05/15 101.016 kg (222 lb 11.2 oz)     Intake/Output Summary (Last 24 hours) at 05/06/15 1147 Last data filed at 05/06/15 1024  Gross per 24 hour   Intake 1083.34 ml  Output   1800 ml  Net -716.66 ml    Physical Exam:   GENERAL: Pleasant-appearing in no apparent distress.  HEAD, EYES, EARS, NOSE AND THROAT: Atraumatic, normocephalic. Extraocular muscles are intact. Pupils equal and reactive to light. Sclerae anicteric. No conjunctival injection. No oro-pharyngeal erythema.  NECK: Supple. There is no jugular venous distention. No bruits, no lymphadenopathy, no thyromegaly.  HEART: Regular rate and rhythm, tachycardic. No murmurs, no rubs, no clicks.  LUNGS: Clear to auscultation bilaterally. No rales or rhonchi. No wheezes.  ABDOMEN: Soft,  flat, nontender, nondistended. Has good bowel sounds. No hepatosplenomegaly appreciated.  EXTREMITIES: No evidence of any cyanosis, clubbing, or peripheral edema.  +2 pedal and radial pulses bilaterally.  NEUROLOGIC: The patient is alert, awake, and oriented x3 with no focal motor or sensory deficits appreciated bilaterally.  SKIN: Moist and warm with no rashes appreciated.  Psych: Not anxious, depressed LN: No inguinal LN enlargement    Antibiotics   Anti-infectives    None      Medications   Scheduled Meds: . ALPRAZolam  0.25 mg Oral BID  . amiodarone  200 mg Oral Daily  . amLODipine  10 mg Oral Daily  . aspirin  81 mg Oral Daily  . atorvastatin  40 mg Oral Daily  . benazepril  40 mg Oral Daily  . enoxaparin (LOVENOX) injection  1 mg/kg Subcutaneous Q12H  . hydrALAZINE  50 mg Oral BID  . magic mouthwash  5 mL Oral TID  . sodium chloride  3 mL Intravenous Q12H  . traZODone  50 mg Oral QHS   Continuous Infusions: . sodium chloride 50 mL/hr at 05/05/15 2105   PRN Meds:.acetaminophen **OR** acetaminophen, HYDROcodone-acetaminophen, lidocaine, ondansetron **OR** ondansetron (ZOFRAN) IV   Data Review:   Micro Results No results found for this or any previous visit (from the past 240 hour(s)).  Radiology Reports No results found.   CBC  Recent Labs Lab 05/06/15 0359   WBC 4.8  HGB 9.8*  HCT 31.7*  PLT 190  MCV 86.9  MCH 26.9  MCHC 31.0*  RDW 16.1*    Chemistries   Recent Labs Lab 05/06/15 0359  NA 141  K 3.6  CL 111  CO2 24  GLUCOSE 110*  BUN 21*  CREATININE 1.68*  CALCIUM 9.1   ------------------------------------------------------------------------------------------------------------------ estimated creatinine clearance is 37.7 mL/min (by C-G formula based on Cr of 1.68). ------------------------------------------------------------------------------------------------------------------ No results for input(s): HGBA1C in the last 72 hours. ------------------------------------------------------------------------------------------------------------------ No results for input(s): CHOL, HDL, LDLCALC, TRIG, CHOLHDL, LDLDIRECT in the last 72 hours. ------------------------------------------------------------------------------------------------------------------  Recent Labs  05/05/15 1419  TSH 2.397   ------------------------------------------------------------------------------------------------------------------ No results for input(s): VITAMINB12, FOLATE, FERRITIN, TIBC, IRON, RETICCTPCT in the last 72 hours.  Coagulation profile  Recent Labs Lab 05/05/15 0751 05/06/15 1014  INR 2.34 2.11    No results for input(s): DDIMER in the last 72 hours.  Cardiac Enzymes  Recent Labs Lab 05/05/15 1419 05/05/15 1936 05/06/15 0145  TROPONINI <0.03 <0.03 <0.03   ------------------------------------------------------------------------------------------------------------------ Invalid input(s): POCBNP    Assessment & Plan   IMPRESSION AND PLAN: Patient is a 63 year old African-American female with history of atrial fibrillation which appears to have chronic bradycardia status post cardioversion now with worsening bradycardia.  1. Bradycardia-  her heart rate is improved on being off amiodarone and clonidine. She is being  followed by cardiology if heart rate continues to be a issue may need a pacemaker  2 . Chest pain- plan was for cardiac catheterization tomorrow   3.  HTN- continue benazepril and amlodipine as well as hydralazine, clonidine on hold  4.  . Hyperlipidemia- continue Lipitor as taking at home.   5.   chronic A. fib /status post aortic valve replacement - Coumadin on hold due to cath tomm. On full dose bridging Lovenox   6. CKDIII, monitor for now     Code Status Orders        Start     Ordered   05/05/15 1341  Full code   Continuous     05/05/15 1340  Family Communication: Husband   dispositioon Plan:    Home   Procedures     cardioversion   Consults   Cardiology  DVT Prophylaxis  Lovenox   Lab Results  Component Value Date   PLT 190 05/06/2015     Time Spent in minutes   35 minutes Dustin Flock M.D on 05/06/2015 at 11:47 AM  Between 7am to 6pm - Pager - [phone]  After 6pm go to [email or web address] - password [email or web address] [facility]   Office  [phone]

## 2015-05-06 NOTE — Progress Notes (Signed)
   SUBJECTIVE: Patient feeling much better this morning, no fatigue or malaise, no chest pain pressure or tightness, palpitations or shortness of breath.   Filed Vitals:   05/05/15 1406 05/05/15 2108 05/06/15 0546 05/06/15 0721  BP:  136/53 135/50 144/68  Pulse:  61 77 69  Temp:      TempSrc:      Resp:  18 18 18   Height:      Weight: 101.016 kg (222 lb 11.2 oz)     SpO2:  99% 94% 100%    Intake/Output Summary (Last 24 hours) at 05/06/15 0918 Last data filed at 05/06/15 0737  Gross per 24 hour  Intake 843.34 ml  Output   1400 ml  Net -556.66 ml    LABS: Basic Metabolic Panel:  Recent Labs  05/06/15 0359  NA 141  K 3.6  CL 111  CO2 24  GLUCOSE 110*  BUN 21*  CREATININE 1.68*  CALCIUM 9.1   Liver Function Tests: No results for input(s): AST, ALT, ALKPHOS, BILITOT, PROT, ALBUMIN in the last 72 hours. No results for input(s): LIPASE, AMYLASE in the last 72 hours. CBC:  Recent Labs  05/06/15 0359  WBC 4.8  HGB 9.8*  HCT 31.7*  MCV 86.9  PLT 190   Cardiac Enzymes:  Recent Labs  05/05/15 1419 05/05/15 1936 05/06/15 0145  TROPONINI <0.03 <0.03 <0.03   BNP: Invalid input(s): POCBNP D-Dimer: No results for input(s): DDIMER in the last 72 hours. Hemoglobin A1C: No results for input(s): HGBA1C in the last 72 hours. Fasting Lipid Panel: No results for input(s): CHOL, HDL, LDLCALC, TRIG, CHOLHDL, LDLDIRECT in the last 72 hours. Thyroid Function Tests:  Recent Labs  05/05/15 1419  TSH 2.397   Anemia Panel: No results for input(s): VITAMINB12, FOLATE, FERRITIN, TIBC, IRON, RETICCTPCT in the last 72 hours.   PHYSICAL EXAM General: Well developed, well nourished, in no acute distress HEENT:  Normocephalic and atramatic Neck:  No JVD.  Lungs: Clear bilaterally to auscultation and percussion. Heart: HRRR . Normal S1 and S2 without gallops or murmurs.  Abdomen: Bowel sounds are positive, abdomen soft and non-tender  Msk:  Back normal, normal gait.  Normal strength and tone for age. Extremities: No clubbing, cyanosis or edema.   Neuro: Alert and oriented X 3. Psych:  Good affect, responds appropriately  TELEMETRY: Reviewed telemetry pt in normal sinus rhythm:  ASSESSMENT AND PLAN: Heart rate improved with holding amiodarone. No need to consult Dr. Rebecka Apley for PPM placement at this time. Plan for cardiac catheterization tomorrow, initiate renal caution fluids, will proceed with If renal function will tolerate it. Patient states she bit her tongue yesterday during electrocardioversion, advised 15 mL and viscous lidocaine oral rinse solution every 4.   Patient and plan discussed with supervising provider, Dr. Neoma Laming, who agrees with above findings.   Jeanette Romero, [place]  05/06/2015 9:18 AM

## 2015-05-07 ENCOUNTER — Encounter
Admission: RE | Disposition: A | Discharge status: Home / Self Care | Payer: Self-pay | Source: Ambulatory Visit | Attending: Internal Medicine

## 2015-05-07 HISTORY — PX: CARDIAC CATHETERIZATION: SHX172

## 2015-05-07 LAB — BASIC METABOLIC PANEL
ANION GAP: 8 (ref 5–15)
BUN: 14 mg/dL (ref 6–20)
CO2: 24 mmol/L (ref 22–32)
CREATININE: 1.35 mg/dL — AB (ref 0.44–1.00)
Calcium: 9.5 mg/dL (ref 8.9–10.3)
Chloride: 110 mmol/L (ref 101–111)
GFR calc Af Amer: 47 mL/min — ABNORMAL LOW (ref >60)
GFR, EST NON AFRICAN AMERICAN: 41 mL/min — AB (ref >60)
GLUCOSE: 90 mg/dL (ref 65–99)
POTASSIUM: 3.5 mmol/L (ref 3.5–5.1)
SODIUM: 142 mmol/L (ref 135–145)

## 2015-05-07 LAB — PROTIME-INR
INR: 1.6
Prothrombin Time: 19.2 seconds — ABNORMAL HIGH (ref 11.4–15.0)

## 2015-05-07 SURGERY — LEFT HEART CATH
Anesthesia: Moderate Sedation

## 2015-05-07 SURGERY — LEFT HEART CATH AND CORONARY ANGIOGRAPHY
Anesthesia: Moderate Sedation

## 2015-05-07 MED ORDER — FENTANYL CITRATE (PF) 100 MCG/2ML IJ SOLN
INTRAMUSCULAR | Status: DC | PRN
  Administered 2015-05-07 (×4): 50 ug via INTRAVENOUS

## 2015-05-07 MED ORDER — SODIUM CHLORIDE 0.9 % IJ SOLN: 3.0000 mL | Freq: Two times a day (BID) | INTRAMUSCULAR | Status: DC

## 2015-05-07 MED ORDER — MIDAZOLAM HCL 2 MG/2ML IJ SOLN
INTRAMUSCULAR | Status: AC
  Filled 2015-05-07: qty 2

## 2015-05-07 MED ORDER — FENTANYL CITRATE (PF) 100 MCG/2ML IJ SOLN
INTRAMUSCULAR | Status: AC
  Filled 2015-05-07: qty 2

## 2015-05-07 MED ORDER — IOHEXOL 300 MG/ML  SOLN
INTRAMUSCULAR | Status: DC | PRN
  Administered 2015-05-07: 130 mL via INTRAVENOUS

## 2015-05-07 MED ORDER — AMIODARONE HCL 150 MG/3ML IV SOLN
INTRAVENOUS | Status: DC | PRN
  Administered 2015-05-07: 150 mg via INTRAVENOUS

## 2015-05-07 MED ORDER — ACETAMINOPHEN 325 MG PO TABS: 650.0000 mg | ORAL_TABLET | ORAL | Status: DC | PRN

## 2015-05-07 MED ORDER — ONDANSETRON HCL 4 MG/2ML IJ SOLN: 4.0000 mg | Freq: Four times a day (QID) | INTRAMUSCULAR | Status: DC | PRN

## 2015-05-07 MED ORDER — HEPARIN (PORCINE) IN NACL 2-0.9 UNIT/ML-% IJ SOLN
INTRAMUSCULAR | Status: AC
  Filled 2015-05-07: qty 1000

## 2015-05-07 MED ORDER — SODIUM CHLORIDE 0.9 % IJ SOLN: 3.0000 mL | INTRAMUSCULAR | Status: DC | PRN

## 2015-05-07 MED ORDER — WARFARIN SODIUM 2.5 MG PO TABS
10.0000 mg | ORAL_TABLET | Freq: Once | ORAL | Status: AC
  Administered 2015-05-07: 10 mg via ORAL
  Filled 2015-05-07: qty 1

## 2015-05-07 MED ORDER — SODIUM BICARBONATE 8.4 % IV SOLN
INTRAVENOUS | Status: AC
  Filled 2015-05-07: qty 850

## 2015-05-07 MED ORDER — ENOXAPARIN SODIUM 100 MG/ML ~~LOC~~ SOLN
1.0000 mg/kg | Freq: Two times a day (BID) | SUBCUTANEOUS | Status: DC
  Administered 2015-05-07 – 2015-05-08 (×2): 95 mg via SUBCUTANEOUS
  Filled 2015-05-07 (×2): qty 1

## 2015-05-07 MED ORDER — AMIODARONE HCL 150 MG/3ML IV SOLN
INTRAVENOUS | Status: AC
  Filled 2015-05-07: qty 3

## 2015-05-07 MED ORDER — SODIUM BICARBONATE BOLUS VIA INFUSION
Freq: Once | INTRAVENOUS | Status: AC
  Administered 2015-05-07: 08:00:00 via INTRAVENOUS
  Filled 2015-05-07: qty 1

## 2015-05-07 MED ORDER — SODIUM CHLORIDE 0.9 % IV SOLN: 250.0000 mL | INTRAVENOUS | Status: DC | PRN

## 2015-05-07 MED ORDER — CLONIDINE HCL 0.1 MG PO TABS
0.1000 mg | ORAL_TABLET | Freq: Two times a day (BID) | ORAL | Status: DC
  Administered 2015-05-07 – 2015-05-08 (×3): 0.1 mg via ORAL
  Filled 2015-05-07 (×3): qty 1

## 2015-05-07 MED ORDER — SODIUM CHLORIDE 0.9 % WEIGHT BASED INFUSION
1.0000 mL/kg/h | INTRAVENOUS | Status: AC
  Administered 2015-05-07: 1 mL/kg/h via INTRAVENOUS

## 2015-05-07 MED ORDER — RANOLAZINE ER 500 MG PO TB12
500.0000 mg | ORAL_TABLET | Freq: Two times a day (BID) | ORAL | Status: DC
  Administered 2015-05-07 – 2015-05-08 (×2): 500 mg via ORAL
  Filled 2015-05-07 (×2): qty 1

## 2015-05-07 MED ORDER — MIDAZOLAM HCL 2 MG/2ML IJ SOLN
INTRAMUSCULAR | Status: DC | PRN
  Administered 2015-05-07 (×4): 1 mg via INTRAVENOUS

## 2015-05-07 SURGICAL SUPPLY — 13 items
CATH INFINITI 5 FR 3DRC (CATHETERS) ×2 IMPLANT
CATH INFINITI 5FR JL4 (CATHETERS) ×2 IMPLANT
CATH INFINITI 5FR JL5 (CATHETERS) ×2 IMPLANT
CATH INFINITI JR4 5F (CATHETERS) ×2 IMPLANT
DEVICE CLOSURE MYNXGRIP 5F (Vascular Products) ×2 IMPLANT
GUIDEWIRE VERSACORE 175CM (WIRE) ×4 IMPLANT
KIT MANI 3VAL PERCEP (MISCELLANEOUS) ×2 IMPLANT
NDL PERC 18GX7CM (NEEDLE) IMPLANT
NEEDLE PERC 18GX7CM (NEEDLE) ×3 IMPLANT
PACK CARDIAC CATH (CUSTOM PROCEDURE TRAY) ×2 IMPLANT
SHEATH PINNACLE 5F 10CM (SHEATH) ×4 IMPLANT
WIRE EMERALD 3MM-J .035X150CM (WIRE) ×2 IMPLANT
WIRE EMERALD 3MM-J .035X260CM (WIRE) ×4 IMPLANT

## 2015-05-07 NOTE — Progress Notes (Signed)
Dr Darvin Neighbours notified about BP of 139/115. Ordered to monitor and give hydralazine earlier.

## 2015-05-07 NOTE — Progress Notes (Signed)
ANTICOAGULATION CONSULT NOTE - Initial Consult  Pharmacy Consult for Warfarin Indication: chest pain/ACS  No Known Allergies  Patient Measurements: Height: 5\' 2"  (157.5 cm) Weight: 212 lb 11.2 oz (96.48 kg) IBW/kg (Calculated) : 50.1  Vital Signs: Temp: 98.3 F (36.8 C) (05/26 0526) Temp Source: Oral (05/26 0526) BP: 168/76 mmHg (05/26 0720) Pulse Rate: 103 (05/26 0526)  Labs:  Recent Labs  05/05/15 0751 05/05/15 1419 05/05/15 1936 05/06/15 0145 05/06/15 0359 05/06/15 1014 05/07/15 0349  HGB  --   --   --   --  9.8*  --   --   HCT  --   --   --   --  31.7*  --   --   PLT  --   --   --   --  190  --   --   LABPROT 25.8*  --   --   --   --  23.8* 19.2*  INR 2.34  --   --   --   --  2.11 1.60  CREATININE  --   --   --   --  1.68*  --  1.35*  TROPONINI  --  <0.03 <0.03 <0.03  --   --   --     Estimated Creatinine Clearance: 45.7 mL/min (by C-G formula based on Cr of 1.35).   Medical History: Past Medical History  Diagnosis Date  . Chronic kidney disease   . Coronary artery disease   . Shortness of breath dyspnea   . Dizziness   . Edema   . Hypertension   . Anxiety   . Gout   . Hypercholesterolemia   . Aortic valve regurgitation     Medications:  Scheduled:  . ALPRAZolam  0.25 mg Oral BID  . amiodarone  200 mg Oral Daily  . amLODipine  10 mg Oral Daily  . aspirin  81 mg Oral Daily  . atorvastatin  40 mg Oral Daily  . benazepril  40 mg Oral Daily  . hydrALAZINE  50 mg Oral BID  . magic mouthwash  5 mL Oral TID  . sodium bicarbonate (CIN) infusion   Intravenous Pre-Cath  . sodium chloride  3 mL Intravenous Q12H  . sodium chloride  3 mL Intravenous Q12H  . traZODone  50 mg Oral QHS  . warfarin  10 mg Oral ONCE-1800    Assessment: Patient with unstable angina/ACS/Type A dissection repair, cardiac cath today; on treatment dose lovenox 100mg  [place] q12h to start warfarin 10 mg today per cardiology. Pt has mechanical valve.  Patient's home warfarin dose  PTA was 3.5 mg daily at bedtime.  Goal of Therapy:  INR 2-3   Plan:  Per cardiology note, will continue lovenox 100 mg [place] bid until INR 2.5. Will order warfarin 10 mg PO x 1 today @1800 . Will order follow-up INR for tomorrow AM.  Joen Laura 05/07/2015,9:58 AM

## 2015-05-07 NOTE — Progress Notes (Signed)
Report called to  tammy     Check right groin for bleeding or hematoma.  Patient will be on bedrest for 2 hours post sheath pull---out of bed at now.  Bilateral pulses are dopplered.

## 2015-05-07 NOTE — Progress Notes (Signed)
[place] at [facility]                                                                                                                                                                                            Patient Demographics   Jeanette Romero, is a 64 y.o. female, DOB - 1951-07-08, [id number]  Admit date - 05/05/2015   Admitting Physician Dustin Flock, MD  Outpatient Primary MD for the patient is TEJAN-SIE, Clotilde Dieter, MD  LOS - 2  Patient had a cardiac catheter which shows moderate LAD disease. Currently no chest pain   Review of Systems:   CONSTITUTIONAL: No documented fever. No fatigue, weakness. No weight gain, no weight loss.  EYES: No blurry or double vision.  ENT: No tinnitus. No postnasal drip. No redness of the oropharynx.  RESPIRATORY: No cough, no wheeze, no hemoptysis. Positive dyspnea.  CARDIOVASCULAR: Positive chest pain. No orthopnea. No palpitations. No syncope.  GASTROINTESTINAL: No nausea, no vomiting or diarrhea. No abdominal pain. No melena or hematochezia.  GENITOURINARY: No dysuria or hematuria.  ENDOCRINE: No polyuria or nocturia. No heat or cold intolerance.  HEMATOLOGY: No anemia. No bruising. No bleeding.  INTEGUMENTARY: No rashes. No lesions.  MUSCULOSKELETAL: No arthritis. No swelling. No gout.  NEUROLOGIC: No numbness, tingling, or ataxia. No seizure-type activity.  PSYCHIATRIC: No anxiety. No insomnia. No ADD.    Vitals:   Filed Vitals:   05/07/15 1115 05/07/15 1130 05/07/15 1145 05/07/15 1200  BP:  149/80  135/75  Pulse: 84 80 85 89  Temp:      TempSrc:      Resp: 21 17 24 18   Height:      Weight:      SpO2: 96% 97% 97% 98%    Wt Readings from Last 3 Encounters:  05/07/15 96.48 kg (212 lb 11.2 oz)     Intake/Output Summary (Last 24 hours) at 05/07/15 1237 Last data filed at 05/07/15 1000  Gross per 24 hour  Intake   1200 ml  Output   1800 ml  Net   -600 ml    Physical Exam:    GENERAL: Pleasant-appearing in no apparent distress.  HEAD, EYES, EARS, NOSE AND THROAT: Atraumatic, normocephalic. Extraocular muscles are intact. Pupils equal and reactive to light. Sclerae anicteric. No conjunctival injection. No oro-pharyngeal erythema.  NECK: Supple. There is no jugular venous distention. No bruits, no lymphadenopathy, no thyromegaly.  HEART: Regular rate and rhythm, tachycardic. No murmurs, no rubs, no clicks.  LUNGS: Clear to auscultation bilaterally. No rales or rhonchi. No wheezes.  ABDOMEN: Soft, flat, nontender, nondistended. Has good bowel  sounds. No hepatosplenomegaly appreciated.  EXTREMITIES: No evidence of any cyanosis, clubbing, or peripheral edema.  +2 pedal and radial pulses bilaterally.  NEUROLOGIC: The patient is alert, awake, and oriented x3 with no focal motor or sensory deficits appreciated bilaterally.  SKIN: Moist and warm with no rashes appreciated.  Psych: Not anxious, depressed LN: No inguinal LN enlargement    Antibiotics   Anti-infectives    None      Medications   Scheduled Meds: . ALPRAZolam  0.25 mg Oral BID  . amiodarone  200 mg Oral Daily  . amLODipine  10 mg Oral Daily  . aspirin  81 mg Oral Daily  . atorvastatin  40 mg Oral Daily  . benazepril  40 mg Oral Daily  . cloNIDine  0.1 mg Oral BID  . hydrALAZINE  50 mg Oral BID  . magic mouthwash  5 mL Oral TID  . ranolazine  500 mg Oral BID  . sodium bicarbonate (CIN) infusion   Intravenous Pre-Cath  . sodium chloride  3 mL Intravenous Q12H  . sodium chloride  3 mL Intravenous Q12H  . traZODone  50 mg Oral QHS  . warfarin  10 mg Oral ONCE-1800   Continuous Infusions: . sodium chloride 50 mL/hr at 05/06/15 1900  . sodium chloride     PRN Meds:.sodium chloride, acetaminophen, HYDROcodone-acetaminophen, lidocaine, ondansetron **OR** ondansetron (ZOFRAN) IV, sodium chloride   Data Review:   Micro Results No results found for this or any previous visit (from the past 240  hour(s)).  Radiology Reports No results found.   CBC  Recent Labs Lab 05/06/15 0359  WBC 4.8  HGB 9.8*  HCT 31.7*  PLT 190  MCV 86.9  MCH 26.9  MCHC 31.0*  RDW 16.1*    Chemistries   Recent Labs Lab 05/06/15 0359 05/07/15 0349  NA 141 142  K 3.6 3.5  CL 111 110  CO2 24 24  GLUCOSE 110* 90  BUN 21* 14  CREATININE 1.68* 1.35*  CALCIUM 9.1 9.5   ------------------------------------------------------------------------------------------------------------------ estimated creatinine clearance is 45.7 mL/min (by C-G formula based on Cr of 1.35). ------------------------------------------------------------------------------------------------------------------ No results for input(s): HGBA1C in the last 72 hours. ------------------------------------------------------------------------------------------------------------------ No results for input(s): CHOL, HDL, LDLCALC, TRIG, CHOLHDL, LDLDIRECT in the last 72 hours. ------------------------------------------------------------------------------------------------------------------  Recent Labs  05/05/15 1419  TSH 2.397   ------------------------------------------------------------------------------------------------------------------ No results for input(s): VITAMINB12, FOLATE, FERRITIN, TIBC, IRON, RETICCTPCT in the last 72 hours.  Coagulation profile  Recent Labs Lab 05/05/15 0751 05/06/15 1014 05/07/15 0349  INR 2.34 2.11 1.60    No results for input(s): DDIMER in the last 72 hours.  Cardiac Enzymes  Recent Labs Lab 05/05/15 1419 05/05/15 1936 05/06/15 0145  TROPONINI <0.03 <0.03 <0.03   ------------------------------------------------------------------------------------------------------------------ Invalid input(s): POCBNP    Assessment & Plan   IMPRESSION AND PLAN: Patient is a 64 year old African-American female with history of atrial fibrillation which appears to have chronic bradycardia  status post cardioversion now with worsening bradycardia.  1. Bradycardia-  her heart rate is improved on being off amiodarone and clonidine. Heart rate stable   2 . Chest pain- status post cardiac catheter with moderate LAD disease, medical management recommended  3.  HTN- continue benazepril and amlodipine as well as hydralazine, clonidine on hold  4.  . Hyperlipidemia- continue Lipitor as taking at home.   5.   chronic A. fib /status post aortic valve replacement - Coumadin resumed today. On full dose bridging Lovenox   6. CKDIII, monitor for now  Code Status Orders        Start     Ordered   05/05/15 1341  Full code   Continuous     05/05/15 1340      Family Communication: Husband   dispositioon Plan:    Home tomorrow   Procedures     cardioversion   Consults   Cardiology  DVT Prophylaxis  Lovenox   Lab Results  Component Value Date   PLT 190 05/06/2015     Time Spent in minutes   35 minutes Dustin Flock M.D on 05/07/2015 at 12:37 PM  Between 7am to 6pm - Pager - [phone]  After 6pm go to [email or web address] - password [email or web address] [facility]   Office  [phone]

## 2015-05-07 NOTE — Care Management (Signed)
Patient presents from home.  Independent in all alds.  Coumadin held prior to cardiac cath and is now requiring bridge with Lovenox. INR needs to be 2.5 before Lovenox can be discontinued.  She has performed lovenox injections in the past.  States that she will be able to purchase the medication.   Printed some coupons from [employer] for four 100mg  syringes (patient is currently on 95mg  bid).  No discharge needs.

## 2015-05-07 NOTE — Clinical Documentation Improvement (Signed)
MD's, NP's, and PA's    Noted Creatinine elevated (1.35, 1.68) in patient with GFR ( 47, 36) if appropriate for this admission please document significance of lab values.  Thank you   Possible Clinical Conditions?  Hypertensive Chronic Kidney disease (stg 1-5)  Acute renal Insufficiency    CKD Stage II - GFR 60-80  CKD Stage III - GFR 30-59             Other Condition                  Cannot Clinically Determine    Risk Factors: Hypertension, Atrial fibrillation, bradycardia  Treatment: evaluated with BMET, IVF @ 50 then 100 ml/hr   Thank You, Ree Kida ,RN Clinical Documentation Specialist:  [facility]

## 2015-05-07 NOTE — Progress Notes (Signed)
SUBJECTIVE: Had chest pain this morning  Filed Vitals:   05/06/15 1937 05/07/15 0526 05/07/15 0620 05/07/15 0720  BP: 151/81 170/79 139/115 168/76  Pulse: 61 103    Temp: 98.2 F (36.8 C) 98.3 F (36.8 C)    TempSrc: Oral Oral    Resp: 18 28  20   Height:      Weight:  96.48 kg (212 lb 11.2 oz)    SpO2: 100% 100%  98%    Intake/Output Summary (Last 24 hours) at 05/07/15 0931 Last data filed at 05/07/15 0700  Gross per 24 hour  Intake   1200 ml  Output   1800 ml  Net   -600 ml    LABS: Basic Metabolic Panel:  Recent Labs  05/06/15 0359 05/07/15 0349  NA 141 142  K 3.6 3.5  CL 111 110  CO2 24 24  GLUCOSE 110* 90  BUN 21* 14  CREATININE 1.68* 1.35*  CALCIUM 9.1 9.5   Liver Function Tests: No results for input(s): AST, ALT, ALKPHOS, BILITOT, PROT, ALBUMIN in the last 72 hours. No results for input(s): LIPASE, AMYLASE in the last 72 hours. CBC:  Recent Labs  05/06/15 0359  WBC 4.8  HGB 9.8*  HCT 31.7*  MCV 86.9  PLT 190   Cardiac Enzymes:  Recent Labs  05/05/15 1419 05/05/15 1936 05/06/15 0145  TROPONINI <0.03 <0.03 <0.03   BNP: Invalid input(s): POCBNP D-Dimer: No results for input(s): DDIMER in the last 72 hours. Hemoglobin A1C: No results for input(s): HGBA1C in the last 72 hours. Fasting Lipid Panel: No results for input(s): CHOL, HDL, LDLCALC, TRIG, CHOLHDL, LDLDIRECT in the last 72 hours. Thyroid Function Tests:  Recent Labs  05/05/15 1419  TSH 2.397   Anemia Panel: No results for input(s): VITAMINB12, FOLATE, FERRITIN, TIBC, IRON, RETICCTPCT in the last 72 hours.   PHYSICAL EXAM General: Well developed, well nourished, in no acute distress HEENT:  Normocephalic and atramatic Neck:  No JVD.  Lungs: Clear bilaterally to auscultation and percussion. Heart: HRRR . Normal S1 and S2 without gallops or murmurs.  Abdomen: Bowel sounds are positive, abdomen soft and non-tender  Msk:  Back normal, normal gait. Normal strength and tone  for age. Extremities: No clubbing, cyanosis or edema.   Neuro: Alert and oriented X 3. Psych:  Good affect, responds appropriately  TELEMETRY: sinus at 90/min  ASSESSMENT AND PLAN: Unstable angina/ACS/Type A dissection repair: Cardiac cath today had moderate disease in mid LAD, LCX and distal RCA. LVEF normal on echo. Advise IV amiodrone  Bolus as HR 90/min. May go home tomorrow and give lovenox today and coumadin 10 mg. She needs to take lovenox until INR 2.5 Active Problems:   Bradycardia    [name], MD, [facility] 05/07/2015 9:31 AM

## 2015-05-07 NOTE — Progress Notes (Signed)
ANTICOAGULATION CONSULT NOTE - Initial Consult  Pharmacy Consult for Lovenox Indication: chest pain/ACS  No Known Allergies  Patient Measurements: Height: 5\' 2"  (157.5 cm) Weight: 212 lb 11.2 oz (96.48 kg) IBW/kg (Calculated) : 50.1   Vital Signs: Temp: 98.3 F (36.8 C) (05/26 0526) Temp Source: Oral (05/26 0526) BP: 160/74 mmHg (05/26 1345) Pulse Rate: 89 (05/26 1345)  Labs:  Recent Labs  05/05/15 0751 05/05/15 1419 05/05/15 1936 05/06/15 0145 05/06/15 0359 05/06/15 1014 05/07/15 0349  HGB  --   --   --   --  9.8*  --   --   HCT  --   --   --   --  31.7*  --   --   PLT  --   --   --   --  190  --   --   LABPROT 25.8*  --   --   --   --  23.8* 19.2*  INR 2.34  --   --   --   --  2.11 1.60  CREATININE  --   --   --   --  1.68*  --  1.35*  TROPONINI  --  <0.03 <0.03 <0.03  --   --   --     Estimated Creatinine Clearance: 45.7 mL/min (by C-G formula based on Cr of 1.35).   Medical History: Past Medical History  Diagnosis Date  . Chronic kidney disease   . Coronary artery disease   . Shortness of breath dyspnea   . Dizziness   . Edema   . Hypertension   . Anxiety   . Gout   . Hypercholesterolemia   . Aortic valve regurgitation     Medications:  Scheduled:  . ALPRAZolam  0.25 mg Oral BID  . amiodarone  200 mg Oral Daily  . amLODipine  10 mg Oral Daily  . aspirin  81 mg Oral Daily  . atorvastatin  40 mg Oral Daily  . benazepril  40 mg Oral Daily  . cloNIDine  0.1 mg Oral BID  . enoxaparin (LOVENOX) injection  1 mg/kg Subcutaneous Q12H  . hydrALAZINE  50 mg Oral BID  . magic mouthwash  5 mL Oral TID  . ranolazine  500 mg Oral BID  . sodium bicarbonate (CIN) infusion   Intravenous Pre-Cath  . sodium chloride  3 mL Intravenous Q12H  . sodium chloride  3 mL Intravenous Q12H  . traZODone  50 mg Oral QHS  . warfarin  10 mg Oral ONCE-1800   Infusions:  . sodium chloride 50 mL/hr at 05/06/15 1900    Assessment: Patient with a h/o a mechanical valve  and ACS on Coumadin with Lovenox bridge. Per cardiology note, patient needs to be on Lovenox until INR 2.5.     Plan:  Will continue Lovenox 1 mg/kg q 12 h until INR 2.5.   Ulice Dash D 05/07/2015,3:57 PM

## 2015-05-08 ENCOUNTER — Encounter: Payer: Self-pay | Admitting: Cardiovascular Disease

## 2015-05-08 LAB — BASIC METABOLIC PANEL
ANION GAP: 8 (ref 5–15)
BUN: 13 mg/dL (ref 6–20)
CO2: 28 mmol/L (ref 22–32)
CREATININE: 1.41 mg/dL — AB (ref 0.44–1.00)
Calcium: 8.8 mg/dL — ABNORMAL LOW (ref 8.9–10.3)
Chloride: 105 mmol/L (ref 101–111)
GFR calc Af Amer: 45 mL/min — ABNORMAL LOW (ref >60)
GFR, EST NON AFRICAN AMERICAN: 38 mL/min — AB (ref >60)
Glucose, Bld: 103 mg/dL — ABNORMAL HIGH (ref 65–99)
Potassium: 3.4 mmol/L — ABNORMAL LOW (ref 3.5–5.1)
SODIUM: 141 mmol/L (ref 135–145)

## 2015-05-08 LAB — PROTIME-INR
INR: 1.29
PROTHROMBIN TIME: 16.3 s — AB (ref 11.4–15.0)

## 2015-05-08 MED ORDER — WARFARIN SODIUM 2.5 MG PO TABS: 5.0000 mg | ORAL_TABLET | Freq: Every day | ORAL | Status: DC

## 2015-05-08 MED ORDER — RANOLAZINE ER 500 MG PO TB12
500.0000 mg | ORAL_TABLET | Freq: Two times a day (BID) | ORAL | Status: DC
Start: 2015-05-08 — End: 2017-10-31

## 2015-05-08 MED ORDER — WARFARIN - PHARMACIST DOSING INPATIENT: Freq: Every day | Status: DC

## 2015-05-08 MED ORDER — ENOXAPARIN SODIUM 150 MG/ML ~~LOC~~ SOLN: 1.0000 mg/kg | Freq: Two times a day (BID) | SUBCUTANEOUS | Status: DC

## 2015-05-08 MED ORDER — CLONIDINE HCL 0.2 MG PO TABS: 0.2000 mg | ORAL_TABLET | Freq: Three times a day (TID) | ORAL | Status: DC

## 2015-05-08 MED ORDER — WARFARIN SODIUM 10 MG PO TABS: 10.0000 mg | ORAL_TABLET | Freq: Every day | ORAL | Status: DC

## 2015-05-08 NOTE — Progress Notes (Signed)
Dr Sandria Bales notified about Low BP 113/54, questioning about scheduled hydralazine. Order received to hold Hydralazine.

## 2015-05-08 NOTE — Progress Notes (Signed)
ANTICOAGULATION CONSULT NOTE - Initial Consult  Pharmacy Consult for Warfarin Indication: chest pain/ACS  No Known Allergies  Patient Measurements: Height: 5\' 2"  (157.5 cm) Weight: 212 lb 11.2 oz (96.48 kg) IBW/kg (Calculated) : 50.1  Vital Signs: Temp: 97.8 F (36.6 C) (05/27 0604) BP: 127/62 mmHg (05/27 0604) Pulse Rate: 77 (05/27 0604)  Labs:  Recent Labs  05/05/15 1419 05/05/15 1936 05/06/15 0145 05/06/15 0359 05/06/15 1014 05/07/15 0349 05/08/15 0520  HGB  --   --   --  9.8*  --   --   --   HCT  --   --   --  31.7*  --   --   --   PLT  --   --   --  190  --   --   --   LABPROT  --   --   --   --  23.8* 19.2* 16.3*  INR  --   --   --   --  2.11 1.60 1.29  CREATININE  --   --   --  1.68*  --  1.35* 1.41*  TROPONINI <0.03 <0.03 <0.03  --   --   --   --     Estimated Creatinine Clearance: 43.7 mL/min (by C-G formula based on Cr of 1.41).   Medical History: Past Medical History  Diagnosis Date  . Chronic kidney disease   . Coronary artery disease   . Shortness of breath dyspnea   . Dizziness   . Edema   . Hypertension   . Anxiety   . Gout   . Hypercholesterolemia   . Aortic valve regurgitation     Medications:  Scheduled:  . ALPRAZolam  0.25 mg Oral BID  . amiodarone  200 mg Oral Daily  . amLODipine  10 mg Oral Daily  . aspirin  81 mg Oral Daily  . atorvastatin  40 mg Oral Daily  . benazepril  40 mg Oral Daily  . cloNIDine  0.1 mg Oral BID  . enoxaparin (LOVENOX) injection  1 mg/kg Subcutaneous Q12H  . hydrALAZINE  50 mg Oral BID  . magic mouthwash  5 mL Oral TID  . ranolazine  500 mg Oral BID  . sodium chloride  3 mL Intravenous Q12H  . sodium chloride  3 mL Intravenous Q12H  . traZODone  50 mg Oral QHS  . warfarin  5 mg Oral q1800  . Warfarin - Pharmacist Dosing Inpatient   Does not apply q1800    Assessment: Patient with unstable angina/ACS/Type A dissection repair, cardiac cath today; on treatment dose lovenox 100mg  [place] q12h to start  warfarin 10 mg today per cardiology. Pt has mechanical valve.  Patient's home warfarin dose PTA was 3.5 mg daily at bedtime. Current INR: 1.29 and decreasing.   Goal of Therapy:  INR 2-3   Plan:  Per cardiology note, will continue lovenox 100 mg  bid until INR 2.5. Current INR is 1.29. Patient received 10mg  on 5/26. Will order warfarin 5mg  daily and continue to follow.   Jeanette Romero 05/08/2015,6:15 AM

## 2015-05-08 NOTE — Progress Notes (Signed)
Phlebotomist unable to collect blood from Pt this morning. Blood collected by this writer at 0520, one attempt, pt tolerated well.

## 2015-05-08 NOTE — Progress Notes (Signed)
   SUBJECTIVE: pt states she is feeling much better this morning, no CP.    Filed Vitals:   05/07/15 2228 05/08/15 0004 05/08/15 0604 05/08/15 0819  BP: 154/66 133/57 127/62 158/96  Pulse:   77   Temp:   97.8 F (36.6 C)   TempSrc:      Resp:   18   Height:      Weight:      SpO2:   100%     Intake/Output Summary (Last 24 hours) at 05/08/15 1103 Last data filed at 05/08/15 0720  Gross per 24 hour  Intake    600 ml  Output   2025 ml  Net  -1425 ml    LABS: Basic Metabolic Panel:  Recent Labs  05/07/15 0349 05/08/15 0520  NA 142 141  K 3.5 3.4*  CL 110 105  CO2 24 28  GLUCOSE 90 103*  BUN 14 13  CREATININE 1.35* 1.41*  CALCIUM 9.5 8.8*   Liver Function Tests: No results for input(s): AST, ALT, ALKPHOS, BILITOT, PROT, ALBUMIN in the last 72 hours. No results for input(s): LIPASE, AMYLASE in the last 72 hours. CBC:  Recent Labs  05/06/15 0359  WBC 4.8  HGB 9.8*  HCT 31.7*  MCV 86.9  PLT 190   Cardiac Enzymes:  Recent Labs  05/05/15 1419 05/05/15 1936 05/06/15 0145  TROPONINI <0.03 <0.03 <0.03   BNP: Invalid input(s): POCBNP D-Dimer: No results for input(s): DDIMER in the last 72 hours. Hemoglobin A1C: No results for input(s): HGBA1C in the last 72 hours. Fasting Lipid Panel: No results for input(s): CHOL, HDL, LDLCALC, TRIG, CHOLHDL, LDLDIRECT in the last 72 hours. Thyroid Function Tests:  Recent Labs  05/05/15 1419  TSH 2.397   Anemia Panel: No results for input(s): VITAMINB12, FOLATE, FERRITIN, TIBC, IRON, RETICCTPCT in the last 72 hours.   PHYSICAL EXAM General: Well developed, well nourished, in no acute distress HEENT:  Normocephalic and atramatic Neck:  No JVD.  Lungs: Clear bilaterally to auscultation and percussion. Heart: HRRR . Normal S1 and S2 without gallops or murmurs.  Abdomen: Bowel sounds are positive, abdomen soft and non-tender  Msk:  Back normal, normal gait. Normal strength and tone for age. Extremities: No  clubbing, cyanosis or edema.   Neuro: Alert and oriented X 3. Psych:  Good affect, responds appropriately  TELEMETRY: Reviewed telemetry pt in NSR 90 BPM  ASSESSMENT AND PLAN: Pt can go home with lovenox and will f/u in office Monday at 2pm for EKG and PT/INR check. Advise restarting amiodarone 200mg  daily and clonidine 0.2 TID.    Patient and plan discussed with supervising provider, Dr. Neoma Laming, who agrees with above findings.   Kelby Fam [place], [place]  05/08/2015 11:03 AM

## 2015-05-08 NOTE — Discharge Summary (Signed)
Jeanette Romero, 64 y.o., DOB [birth year], MRN [id number]. Admission date: 05/05/2015 Discharge Date 05/08/2015 Primary MD Volanda Napoleon, MD Admitting Physician Dustin Flock, MD  Admission Diagnosis  Unstable angina   CAD [place] unstable angina  Discharge Diagnosis   Active Problems:   Bradycardia   Past Medical History  Diagnosis Date  . Chronic kidney disease   . Coronary artery disease   . Shortness of breath dyspnea   . Dizziness   . Edema   . Hypertension   . Anxiety   . Gout   . Hypercholesterolemia   . Aortic valve regurgitation     Past Surgical History  Procedure Laterality Date  . Aortic valve replacement    . Cardiac catheterization    . Cardiac valve replacement    . Electrophysiologic study N/A 05/05/2015    Procedure: Cardioversion;  Surgeon: [name], MD;  Location: [facility];  Service: Cardiovascular;  Laterality: N/A;  . Cardiac catheterization N/A 05/07/2015    Procedure: Left Heart Cath;  Surgeon: [name], MD;  Location: [facility];  Service: Cardiovascular;  Laterality: N/A;      Hospital Course   64 year old African-American female, who had an elective cardioversion for atrial fibrillation. Patient's heart rate was noted to be low before and after the cardioversion. Into the 30s. Due to that she was hospitalized. Her amiodarone and clonidine were held. Patient remained in sinus rhythm. She was having trouble with chest pain as outpatient and was supposed to have a cardiac catheter, which she had done during this hospitalization. The cardiac catheterization she did show moderate heart disease. Medical management was recommended. Patient also has a history of aortic valve replacement and is on chronic anticoagulation, her Coumadin was held and she received vitamin K per cardiology for her cardiac catheterization. Her INR remains subtherapeutic therefore she started [facility] on Monday to have her INR checked.  Her heart rate and blood  pressure started increasing therefore she is resume back on the amiodarone. An lower dose clonidine per cardiology.    Consults  cardiology  Significant Tests:  See full reports for all details   Cardiac cath No results found.     Today   Subjective:   Feels well, and currently has no chest pain is very anxious to go home. Objective:   Blood pressure 158/96, pulse 77, temperature 97.8 F (36.6 C), temperature source Oral, resp. rate 18, height 5\' 2"  (1.575 m), weight 96.48 kg (212 lb 11.2 oz), SpO2 100 %.  .  Intake/Output Summary (Last 24 hours) at 05/08/15 1339 Last data filed at 05/08/15 0720  Gross per 24 hour  Intake    600 ml  Output   2025 ml  Net  -1425 ml    Exam VITAL SIGNS: Blood pressure 158/96, pulse 77, temperature 97.8 F (36.6 C), temperature source Oral, resp. rate 18, height 5\' 2"  (1.575 m), weight 96.48 kg (212 lb 11.2 oz), SpO2 100 %.  GENERAL:  64 y.o.-year-old patient lying in the bed with no acute distress.  EYES: Pupils equal, round, reactive to light and accommodation. No scleral icterus. Extraocular muscles intact.  HEENT: Head atraumatic, normocephalic. Oropharynx and nasopharynx clear.  NECK:  Supple, no jugular venous distention. No thyroid enlargement, no tenderness.  LUNGS: Normal breath sounds bilaterally, no wheezing, rales,rhonchi or crepitation. No use of accessory muscles of respiration.  CARDIOVASCULAR: S1, S2 normal. No murmurs, rubs, or gallops.  ABDOMEN: Soft, nontender, nondistended. Bowel sounds present. No organomegaly or  mass.  EXTREMITIES: No pedal edema, cyanosis, or clubbing.  NEUROLOGIC: Cranial nerves II through XII are intact. Muscle strength 5/5 in all extremities. Sensation intact. Gait not checked.  PSYCHIATRIC: The patient is alert and oriented x 3.  SKIN: No obvious rash, lesion, or ulcer.   Data Review    Cultures -   CBC w Diff: Lab Results  Component Value Date   WBC 4.8 05/06/2015   WBC 13.2* 03/10/2012    HGB 9.8* 05/06/2015   HGB 8.4* 03/10/2012   HCT 31.7* 05/06/2015   HCT 25.2* 03/10/2012   PLT 190 05/06/2015   PLT 179 03/10/2012   LYMPHOPCT 6.3 03/10/2012   MONOPCT 9.3 03/10/2012   EOSPCT 0.4 03/10/2012   BASOPCT 0.0 03/10/2012   CMP: Lab Results  Component Value Date   NA 141 05/08/2015   NA 138 03/10/2012   K 3.4* 05/08/2015   K 4.0 03/10/2012   CL 105 05/08/2015   CL 103 03/10/2012   CO2 28 05/08/2015   CO2 25 03/10/2012   BUN 13 05/08/2015   BUN 23* 03/10/2012   CREATININE 1.41* 05/08/2015   CREATININE 1.32* 03/10/2012  .  Micro Results No results found for this or any previous visit (from the past 240 hour(s)).      Code Status Orders        Start     Ordered   05/07/15 0834  Full code   Continuous     05/07/15 0834          Follow-up Information    Follow up with Neoma Laming A, MD On 05/18/2015.   Specialty:  Cardiology   Why:  at 1:45pm.    Contact information:   [place] [address] [phone]       Discharge Medications     Medication List    TAKE these medications        ALPRAZolam 0.25 MG tablet  Commonly known as:  XANAX  Take 0.25 mg by mouth 2 (two) times daily.     amiodarone 200 MG tablet  Commonly known as:  PACERONE  Take 200 mg by mouth daily.     amLODipine 10 MG tablet  Commonly known as:  NORVASC  Take 10 mg by mouth daily.     aspirin EC 81 MG tablet  Take 81 mg by mouth daily.     atorvastatin 40 MG tablet  Commonly known as:  LIPITOR  Take 40 mg by mouth at bedtime.     benazepril 40 MG tablet  Commonly known as:  LOTENSIN  Take 40 mg by mouth daily.     cloNIDine 0.2 MG tablet  Commonly known as:  CATAPRES  Take 1 tablet (0.2 mg total) by mouth 3 (three) times daily.     diphenhydrAMINE 25 MG tablet  Commonly known as:  BENADRYL  Take 25 mg by mouth at bedtime as needed for sleep.     enoxaparin 150 MG/ML injection  Commonly known as:  LOVENOX  Inject 0.64 mLs (95 mg total)  into the skin every 12 (twelve) hours.     hydrALAZINE 25 MG tablet  Commonly known as:  APRESOLINE  Take 25 mg by mouth 2 (two) times daily.     ranolazine 500 MG 12 hr tablet  Commonly known as:  RANEXA  Take 1 tablet (500 mg total) by mouth 2 (two) times daily.     warfarin 10 MG tablet  Commonly known as:  COUMADIN  Take 1  tablet (10 mg total) by mouth daily.         Total Time in preparing paper work, data evaluation and todays exam - 35 minutes  Dustin Flock M.D on 05/08/2015 at 1:39 PM  [facility]   Office  [phone]

## 2015-05-08 NOTE — Care Management (Addendum)
Lovenox 95 mg BID SQ for 5 days no refill per Dr. Dustin Flock called in to [facility] [phone]. Cost $165.29. Patient states concern with this price. RNCM checking with [facility] [phone] and will report back to patient. Follow up with Cardiology for PT/INRs per Dr. Posey Pronto. Patient states she understands how to administer injections and wasting amount for desired dose. Patient has opted to go to [facility] pharmacy to check price herself; she is eager to discharge. Case closed.

## 2015-05-08 NOTE — Progress Notes (Signed)
Discharge instructions given. Education handouts given to patient and husband: angina, bradycardia, vascular site management, how to administer lovenox injections, and ranexa medication. Patient has no further questions and will follow up with cardiology and instructed to call PCP for coumadin dosing. IV and tele discontinued. Patient's husband is taking patient home.

## 2015-05-08 NOTE — Discharge Instructions (Signed)
°  DIET:  °Cardiac diet ° °DISCHARGE CONDITION:  °Good ° °ACTIVITY:  °Activity as tolerated ° °OXYGEN:  °Home Oxygen: No. °  °Oxygen Delivery: room air ° °DISCHARGE LOCATION:  °home  ° °If you experience worsening of your admission symptoms, develop shortness of breath, life threatening emergency, suicidal or homicidal thoughts you must seek medical attention immediately by calling 911 or calling your MD immediately  if symptoms less severe. ° °You Must read complete instructions/literature along with all the possible adverse reactions/side effects for all the Medicines you take and that have been prescribed to you. Take any new Medicines after you have completely understood and accpet all the possible adverse reactions/side effects.  ° °Please note ° °You were cared for by a hospitalist during your hospital stay. If you have any questions about your discharge medications or the care you received while you were in the hospital after you are discharged, you can call the unit and asked to speak with the hospitalist on call if the hospitalist that took care of you is not available. Once you are discharged, your primary care physician will handle any further medical issues. Please note that NO REFILLS for any discharge medications will be authorized once you are discharged, as it is imperative that you return to your primary care physician (or establish a relationship with a primary care physician if you do not have one) for your aftercare needs so that they can reassess your need for medications and monitor your lab values. ° ° ° °

## 2015-05-13 NOTE — Anesthesia Postprocedure Evaluation (Signed)
  Anesthesia Post-op Note  Patient: Jeanette Romero  Procedure(s) Performed: Procedure(s): Cardioversion (N/A)  Anesthesia type:General  Patient location: PACU  Post pain: Pain level controlled  Post assessment: Post-op Vital signs reviewed, Patient's Cardiovascular Status Stable, Respiratory Function Stable, Patent Airway and No signs of Nausea or vomiting  Post vital signs: Reviewed and stable  Last Vitals:  Filed Vitals:   05/08/15 0819  BP: 158/96  Pulse:   Temp:   Resp:     Level of consciousness: awake, alert  and patient cooperative  Complications: No apparent anesthesia complications

## 2015-05-14 SURGERY — LEFT HEART CATH AND CORONARY ANGIOGRAPHY
Anesthesia: Moderate Sedation | Laterality: Left

## 2016-11-10 ENCOUNTER — Other Ambulatory Visit: Payer: Self-pay | Admitting: Internal Medicine

## 2016-11-10 DIAGNOSIS — Z1231 Encounter for screening mammogram for malignant neoplasm of breast: Secondary | ICD-10-CM

## 2016-12-19 ENCOUNTER — Other Ambulatory Visit: Payer: Self-pay | Admitting: Internal Medicine

## 2016-12-19 ENCOUNTER — Ambulatory Visit
Admission: RE | Admit: 2016-12-19 | Discharge: 2016-12-19 | Disposition: A | Discharge status: Home / Self Care | Payer: Medicare Other | Source: Ambulatory Visit | Attending: Internal Medicine | Admitting: Internal Medicine

## 2016-12-19 DIAGNOSIS — Z1231 Encounter for screening mammogram for malignant neoplasm of breast: Secondary | ICD-10-CM

## 2017-01-08 ENCOUNTER — Ambulatory Visit
Admission: EM | Admit: 2017-01-08 | Discharge: 2017-01-08 | Disposition: A | Discharge status: Home / Self Care | Payer: Medicare Other | Attending: Family Medicine | Admitting: Family Medicine

## 2017-01-08 DIAGNOSIS — S29012A Strain of muscle and tendon of back wall of thorax, initial encounter: Secondary | ICD-10-CM

## 2017-01-08 DIAGNOSIS — S161XXA Strain of muscle, fascia and tendon at neck level, initial encounter: Secondary | ICD-10-CM | POA: Diagnosis not present

## 2017-01-08 MED ORDER — CYCLOBENZAPRINE HCL 10 MG PO TABS: 10.0000 mg | ORAL_TABLET | Freq: Three times a day (TID) | ORAL | 0 refills | Status: DC | PRN

## 2017-01-08 MED ORDER — HYDROCODONE-ACETAMINOPHEN 5-325 MG PO TABS: ORAL_TABLET | ORAL | 0 refills | Status: DC

## 2017-01-08 NOTE — ED Triage Notes (Addendum)
Pt reports awaking yesterday with neck pain, soreness/stiffness across upper back and shoulders. Muscles are tender to touch. Able to lift arms but pain to shoulder and neck are worse on left side. 10/10. Denies recent URI, denies recent injury.

## 2017-01-08 NOTE — ED Provider Notes (Signed)
[facility]    CSN: [id number] Arrival date & time: 01/08/17  0912     History   Chief Complaint Chief Complaint  Patient presents with  . Muscle Pain    HPI Jeanette Romero is a 66 y.o. female.   66 yo female with a c/o 2 days of left sided upper back and neck pain. States she woke up in the morning with pain. Denies any trauma, injury, falls, fevers, chills, weakness, numbness/tingling. Pain is worse with movement and radiates down the left arm.     Muscle Pain     Past Medical History:  Diagnosis Date  . Anxiety   . Aortic valve regurgitation   . Chronic kidney disease   . Coronary artery disease   . Dizziness   . Edema   . Gout   . Hypercholesterolemia   . Hypertension   . Shortness of breath dyspnea     Patient Active Problem List   Diagnosis Date Noted  . Bradycardia 05/05/2015    Past Surgical History:  Procedure Laterality Date  . AORTIC VALVE REPLACEMENT    . CARDIAC CATHETERIZATION    . CARDIAC CATHETERIZATION N/A 05/07/2015   Procedure: Left Heart Cath;  Surgeon: [name], MD;  Location: [facility];  Service: Cardiovascular;  Laterality: N/A;  . CARDIAC VALVE REPLACEMENT    . ELECTROPHYSIOLOGIC STUDY N/A 05/05/2015   Procedure: Cardioversion;  Surgeon: [name], MD;  Location: [facility];  Service: Cardiovascular;  Laterality: N/A;    OB History    No data available       Home Medications    Prior to Admission medications   Medication Sig Start Date End Date Taking? Authorizing Provider  ALPRAZolam (XANAX) 0.25 MG tablet Take 0.25 mg by mouth 2 (two) times daily.    Historical Provider, MD  amiodarone (PACERONE) 200 MG tablet Take 200 mg by mouth daily.    Historical Provider, MD  amLODipine (NORVASC) 10 MG tablet Take 10 mg by mouth daily.    Historical Provider, MD  aspirin EC 81 MG tablet Take 81 mg by mouth daily.    Historical Provider, MD  atorvastatin (LIPITOR) 40 MG tablet Take 40 mg by mouth at  bedtime.     Historical Provider, MD  benazepril (LOTENSIN) 40 MG tablet Take 40 mg by mouth daily.    Historical Provider, MD  cloNIDine (CATAPRES) 0.2 MG tablet Take 1 tablet (0.2 mg total) by mouth 3 (three) times daily. Patient taking differently: Take 0.3 mg by mouth 3 (three) times daily. O.3mg  05/08/15   Dustin Flock, MD  cyclobenzaprine (FLEXERIL) 10 MG tablet Take 1 tablet (10 mg total) by mouth 3 (three) times daily as needed for muscle spasms. 01/08/17   Norval Gable, MD  diphenhydrAMINE (BENADRYL) 25 MG tablet Take 25 mg by mouth at bedtime as needed for sleep.    Historical Provider, MD  enoxaparin (LOVENOX) 150 MG/ML injection Inject 0.64 mLs (95 mg total) into the skin every 12 (twelve) hours. 05/08/15   Dustin Flock, MD  hydrALAZINE (APRESOLINE) 25 MG tablet Take 25 mg by mouth 2 (two) times daily.    Historical Provider, MD  HYDROcodone-acetaminophen (NORCO/VICODIN) 5-325 MG tablet 1-2 tabs po qd prn severe pain 01/08/17   Norval Gable, MD  ranolazine (RANEXA) 500 MG 12 hr tablet Take 1 tablet (500 mg total) by mouth 2 (two) times daily. 05/08/15   Dustin Flock, MD  warfarin (COUMADIN) 10 MG tablet Take 1 tablet (  10 mg total) by mouth daily. Patient taking differently: Take 10 mg by mouth daily. 3mg  4 days a week and 4mg  3 days a week 05/08/15   Dustin Flock, MD    Family History Family History  Problem Relation Age of Onset  . Hypertension Mother   . Breast cancer Neg Hx     Social History Social History  Substance Use Topics  . Smoking status: Former Research scientist (life sciences)  . Smokeless tobacco: Former Systems developer  . Alcohol use 0.0 oz/week     Comment: rare     Allergies   Patient has no known allergies.   Review of Systems Review of Systems   Physical Exam Triage Vital Signs ED Triage Vitals  Enc Vitals Group     BP 01/08/17 0925 (!) 143/77     Pulse Rate 01/08/17 0925 82     Resp 01/08/17 0925 18     Temp 01/08/17 0925 98 F (36.7 C)     Temp Source 01/08/17 0925  Oral     SpO2 01/08/17 0925 99 %     Weight 01/08/17 0925 210 lb (95.3 kg)     Height 01/08/17 0925 5' 2.5" (1.588 m)     Head Circumference --      Peak Flow --      Pain Score 01/08/17 0931 10     Pain Loc --      Pain Edu? --      Excl. in [place]? --    No data found.   Updated Vital Signs BP (!) 143/77 (BP Location: Right Arm)   Pulse 82   Temp 98 F (36.7 C) (Oral)   Resp 18   Ht 5' 2.5" (1.588 m)   Wt 210 lb (95.3 kg)   SpO2 99%   BMI 37.80 kg/m   Visual Acuity Right Eye Distance:   Left Eye Distance:   Bilateral Distance:    Right Eye Near:   Left Eye Near:    Bilateral Near:     Physical Exam  Constitutional: She is oriented to person, place, and time. She appears well-developed and well-nourished. No distress.  HENT:  Head: Normocephalic and atraumatic.  Eyes: EOM are normal. Pupils are equal, round, and reactive to light.  Neck: Normal range of motion. Neck supple. No tracheal deviation present. No thyromegaly present.  Musculoskeletal: She exhibits no edema.       Cervical back: She exhibits tenderness (over the left trapezius muscle) and spasm. She exhibits normal range of motion, no bony tenderness, no swelling, no edema, no deformity, no laceration and normal pulse.       Back:  Lymphadenopathy:    She has no cervical adenopathy.  Neurological: She is alert and oriented to person, place, and time. She has normal reflexes. No cranial nerve deficit. She exhibits normal muscle tone. Coordination normal.  Skin: She is not diaphoretic.  Nursing note and vitals reviewed.    UC Treatments / Results  Labs (all labs ordered are listed, but only abnormal results are displayed) Labs Reviewed - No data to display  EKG  EKG Interpretation None       Radiology No results found.  Procedures Procedures (including critical care time)  Medications Ordered in UC Medications - No data to display   Initial Impression / Assessment and Plan / UC Course    I have reviewed the triage vital signs and the nursing notes.  Pertinent labs & imaging results that were available during my care of the  patient were reviewed by me and considered in my medical decision making (see chart for details).       Final Clinical Impressions(s) / UC Diagnoses   Final diagnoses:  Upper back strain, initial encounter  Strain of neck muscle, initial encounter    New Prescriptions Discharge Medication List as of 01/08/2017  9:55 AM    START taking these medications   Details  cyclobenzaprine (FLEXERIL) 10 MG tablet Take 1 tablet (10 mg total) by mouth 3 (three) times daily as needed for muscle spasms., Starting Sun 01/08/2017, Normal    HYDROcodone-acetaminophen (NORCO/VICODIN) 5-325 MG tablet 1-2 tabs po qd prn severe pain, Print       1. diagnosis reviewed with patient 2. rx as per orders above; reviewed possible side effects, interactions, risks and benefits  3. Recommend supportive treatment with gentle stretching, heat 4. Follow-up prn if symptoms worsen or don't improve   Norval Gable, MD 01/08/17 1042

## 2017-08-30 ENCOUNTER — Other Ambulatory Visit: Payer: Self-pay | Admitting: Internal Medicine

## 2017-08-30 DIAGNOSIS — R7989 Other specified abnormal findings of blood chemistry: Secondary | ICD-10-CM

## 2017-09-04 ENCOUNTER — Ambulatory Visit
Admission: RE | Admit: 2017-09-04 | Discharge: 2017-09-04 | Disposition: A | Discharge status: Home / Self Care | Payer: Medicare Other | Source: Ambulatory Visit | Attending: Internal Medicine | Admitting: Internal Medicine

## 2017-09-04 DIAGNOSIS — R7989 Other specified abnormal findings of blood chemistry: Secondary | ICD-10-CM | POA: Diagnosis present

## 2017-10-31 ENCOUNTER — Other Ambulatory Visit: Payer: Self-pay

## 2017-10-31 ENCOUNTER — Encounter (INDEPENDENT_AMBULATORY_CARE_PROVIDER_SITE_OTHER): Payer: Self-pay

## 2017-10-31 ENCOUNTER — Inpatient Hospital Stay: Payer: Medicare Other

## 2017-10-31 ENCOUNTER — Encounter: Payer: Self-pay | Admitting: Oncology

## 2017-10-31 ENCOUNTER — Inpatient Hospital Stay: Payer: Medicare Other | Attending: Oncology | Admitting: Oncology

## 2017-10-31 VITALS — BP 127/61 | HR 54 | Temp 95.6°F | Wt 198.1 lb

## 2017-10-31 DIAGNOSIS — Z7901 Long term (current) use of anticoagulants: Secondary | ICD-10-CM | POA: Insufficient documentation

## 2017-10-31 DIAGNOSIS — M109 Gout, unspecified: Secondary | ICD-10-CM | POA: Diagnosis not present

## 2017-10-31 DIAGNOSIS — D649 Anemia, unspecified: Secondary | ICD-10-CM

## 2017-10-31 DIAGNOSIS — F419 Anxiety disorder, unspecified: Secondary | ICD-10-CM

## 2017-10-31 DIAGNOSIS — Z7982 Long term (current) use of aspirin: Secondary | ICD-10-CM | POA: Diagnosis not present

## 2017-10-31 DIAGNOSIS — R5383 Other fatigue: Secondary | ICD-10-CM | POA: Diagnosis not present

## 2017-10-31 DIAGNOSIS — Z79899 Other long term (current) drug therapy: Secondary | ICD-10-CM | POA: Diagnosis not present

## 2017-10-31 DIAGNOSIS — R002 Palpitations: Secondary | ICD-10-CM

## 2017-10-31 DIAGNOSIS — Z952 Presence of prosthetic heart valve: Secondary | ICD-10-CM | POA: Diagnosis not present

## 2017-10-31 DIAGNOSIS — R791 Abnormal coagulation profile: Secondary | ICD-10-CM

## 2017-10-31 DIAGNOSIS — E7800 Pure hypercholesterolemia, unspecified: Secondary | ICD-10-CM

## 2017-10-31 DIAGNOSIS — R0602 Shortness of breath: Secondary | ICD-10-CM | POA: Diagnosis not present

## 2017-10-31 DIAGNOSIS — R531 Weakness: Secondary | ICD-10-CM | POA: Diagnosis not present

## 2017-10-31 DIAGNOSIS — F329 Major depressive disorder, single episode, unspecified: Secondary | ICD-10-CM

## 2017-10-31 DIAGNOSIS — I10 Essential (primary) hypertension: Secondary | ICD-10-CM | POA: Diagnosis not present

## 2017-10-31 DIAGNOSIS — E0500 Thyrotoxicosis with diffuse goiter without thyrotoxic crisis or storm: Secondary | ICD-10-CM | POA: Diagnosis not present

## 2017-10-31 DIAGNOSIS — I2510 Atherosclerotic heart disease of native coronary artery without angina pectoris: Secondary | ICD-10-CM

## 2017-10-31 DIAGNOSIS — F32A Depression, unspecified: Secondary | ICD-10-CM | POA: Insufficient documentation

## 2017-10-31 DIAGNOSIS — I480 Paroxysmal atrial fibrillation: Secondary | ICD-10-CM

## 2017-10-31 DIAGNOSIS — D529 Folate deficiency anemia, unspecified: Secondary | ICD-10-CM | POA: Diagnosis not present

## 2017-10-31 DIAGNOSIS — E0590 Thyrotoxicosis, unspecified without thyrotoxic crisis or storm: Secondary | ICD-10-CM | POA: Insufficient documentation

## 2017-10-31 DIAGNOSIS — Z87891 Personal history of nicotine dependence: Secondary | ICD-10-CM

## 2017-10-31 LAB — COMPREHENSIVE METABOLIC PANEL
ALBUMIN: 3.3 g/dL — AB (ref 3.5–5.0)
ALK PHOS: 84 U/L (ref 38–126)
ALT: 63 U/L — AB (ref 14–54)
AST: 35 U/L (ref 15–41)
Anion gap: 8 (ref 5–15)
BUN: 68 mg/dL — AB (ref 6–20)
CHLORIDE: 107 mmol/L (ref 101–111)
CO2: 24 mmol/L (ref 22–32)
CREATININE: 2.13 mg/dL — AB (ref 0.44–1.00)
Calcium: 9.3 mg/dL (ref 8.9–10.3)
GFR calc Af Amer: 27 mL/min — ABNORMAL LOW (ref >60)
GFR calc non Af Amer: 23 mL/min — ABNORMAL LOW (ref >60)
GLUCOSE: 130 mg/dL — AB (ref 65–99)
Potassium: 4.9 mmol/L (ref 3.5–5.1)
SODIUM: 139 mmol/L (ref 135–145)
Total Bilirubin: 0.7 mg/dL (ref 0.3–1.2)
Total Protein: 6.4 g/dL — ABNORMAL LOW (ref 6.5–8.1)

## 2017-10-31 LAB — RETICULOCYTES
RBC.: 3 MIL/uL — AB (ref 3.80–5.20)
Retic Count, Absolute: 147 10*3/uL (ref 19.0–183.0)
Retic Ct Pct: 4.9 % — ABNORMAL HIGH (ref 0.4–3.1)

## 2017-10-31 LAB — CBC WITH DIFFERENTIAL/PLATELET
Basophils Absolute: 0 10*3/uL (ref 0–0.1)
Basophils Relative: 0 %
EOS ABS: 0 10*3/uL (ref 0–0.7)
EOS PCT: 0 %
HCT: 24.2 % — ABNORMAL LOW (ref 35.0–47.0)
HEMOGLOBIN: 7.8 g/dL — AB (ref 12.0–16.0)
LYMPHS ABS: 0.3 10*3/uL — AB (ref 1.0–3.6)
Lymphocytes Relative: 4 %
MCH: 27.2 pg (ref 26.0–34.0)
MCHC: 32.1 g/dL (ref 32.0–36.0)
MCV: 84.9 fL (ref 80.0–100.0)
MONO ABS: 0.4 10*3/uL (ref 0.2–0.9)
MONOS PCT: 5 %
NEUTROS PCT: 91 %
Neutro Abs: 7.5 10*3/uL — ABNORMAL HIGH (ref 1.4–6.5)
Platelets: 175 10*3/uL (ref 150–440)
RBC: 2.86 MIL/uL — ABNORMAL LOW (ref 3.80–5.20)
RDW: 17.7 % — AB (ref 11.5–14.5)
WBC: 8.2 10*3/uL (ref 3.6–11.0)

## 2017-10-31 LAB — IRON AND TIBC
Iron: 47 ug/dL (ref 28–170)
Saturation Ratios: 17 % (ref 10.4–31.8)
TIBC: 285 ug/dL (ref 250–450)
UIBC: 238 ug/dL

## 2017-10-31 LAB — FERRITIN: Ferritin: 145 ng/mL (ref 11–307)

## 2017-10-31 LAB — FOLATE: Folate: 8.2 ng/mL (ref >5.9)

## 2017-10-31 NOTE — Progress Notes (Signed)
Patient here today as a new patient for anemia  

## 2017-10-31 NOTE — Progress Notes (Signed)
Hematology/Oncology Consult note [facility] Telephone:([phone] Fax:([phone]   Patient Care Team: [name], MD as PCP - General (Internal Medicine)  REFERRING PROVIDER: [name], MD CHIEF COMPLAINTS/PURPOSE OF CONSULTATION:  Evaluation for labile INR  HISTORY OF PRESENTING ILLNESS:  Jeanette Romero is a  66 y.o.  female with PMH listed below who was referred to me for evaluation of labile INR. She has extensive cardiac comorbidities including history of aortic mechanic valve replacement, aneurysm of aorta, PAF, Coronary artery disease. She was on Amiodarone and also had drug induced hyperthyroidism.  She was sent to me for evaluation of labile INR.  Extensive review of her medical records from primary care physician's office showed  10/27/2017 INR 5.9,  10/24/2017 INR 4.9 10/12/2017 INR 1.2 10/07/2017 INR 6  10/04/2017 INR 8.1  08/11/2017 INR 7.5  05/24/2017 INR 3.4 04/26/2017 INR 3.5 03/15/2017 INR 3.4 01/31/2017 INR 2.7  08/29/2017 Folate 2.8,  B12 825 and the patient was started on folic acid supplementation. 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 Amiodarone was stopped. And patient was referred to endocrinologist for evaluation. She was found out to have Graves' disease and was started on prednisone. Last TSH that was done in the [facility] system showed a TSH of 4.8. Patient reports feeling profoundly fatigued, lack of interest of doing anything. She may be depressed. Denies any suicidal ideation. She still frustrated that she has to go from one doctor to another doctor. She is a hard stick and she feels frustrated that she has to have some any labs done. She makes phone calls during the visit. She is accompanied by her daughter in law to  today's clinic visit. Last colonoscopy was about 4 years ago per patient and was normal. She denies seeing any blood in the stool or melena. Denies any bleeding tendency or easy bruising. He also reports shortness of breath. She did not have good appetite lately until after she was put on prednisone, she started it better.   Review of Systems  Constitutional: Positive for malaise/fatigue. Negative for chills.  HENT: Negative for hearing loss.   Eyes: Negative for blurred vision.  Respiratory: Positive for shortness of breath. Negative for cough.   Cardiovascular: Positive for palpitations and leg swelling. Negative for chest pain.  Gastrointestinal: Negative for abdominal pain, blood in stool, heartburn, nausea and vomiting.  Genitourinary: Negative for dysuria, frequency and hematuria.  Musculoskeletal: Negative for myalgias.  Skin: Negative for rash.  Neurological: Negative for dizziness and headaches.  Endo/Heme/Allergies: Negative for environmental allergies. Does not bruise/bleed easily.  Psychiatric/Behavioral: Positive for depression. Negative for suicidal ideas.    MEDICAL HISTORY:  Past Medical History:  Diagnosis Date  . Anxiety   . Aortic valve regurgitation   . Chronic kidney disease   . Coronary artery disease   . Dizziness   . Edema   . Gout   . Hypercholesterolemia   . Hypertension   . Shortness of breath dyspnea     SURGICAL HISTORY: Past Surgical History:  Procedure Laterality Date  . AORTIC VALVE REPLACEMENT    . CARDIAC CATHETERIZATION    . CARDIAC VALVE REPLACEMENT    . Cardioversion N/A 05/05/2015   Performed by [name], MD at [facility] ORS  . Left Heart Cath N/A 05/07/2015   Performed by Humphrey Rolls,  Emmit Pomfret, MD at [facility] INVASIVE CV LAB    SOCIAL HISTORY: Social History   Socioeconomic History  . Marital status: Married    Spouse name: Not on file  . Number of children: Not on file  . Years of education: Not on file  . Highest education level:  Not on file  Social Needs  . Financial resource strain: Not on file  . Food insecurity - worry: Not on file  . Food insecurity - inability: Not on file  . Transportation needs - medical: Not on file  . Transportation needs - non-medical: Not on file  Occupational History  . Not on file  Tobacco Use  . Smoking status: Former Research scientist (life sciences)  . Smokeless tobacco: Former Network engineer and Sexual Activity  . Alcohol use: Yes    Alcohol/week: 0.0 oz    Comment: rare  . Drug use: No  . Sexual activity: Not on file  Other Topics Concern  . Not on file  Social History Narrative  . Not on file    FAMILY HISTORY: Family History  Problem Relation Age of Onset  . Hypertension Mother   . Breast cancer Neg Hx     ALLERGIES:  has No Known Allergies.  MEDICATIONS:  Current Outpatient Medications  Medication Sig Dispense Refill  . ALPRAZolam (XANAX) 0.25 MG tablet Take 0.25 mg by mouth 2 (two) times daily.    Marland Kitchen aspirin EC 81 MG tablet Take 81 mg by mouth daily.    . benazepril (LOTENSIN) 40 MG tablet Take 40 mg by mouth daily.    . cloNIDine (CATAPRES) 0.2 MG tablet Take 1 tablet (0.2 mg total) by mouth 3 (three) times daily. (Patient taking differently: Take 0.3 mg by mouth 3 (three) times daily. O.3mg ) 30 tablet 0   No current facility-administered medications for this visit.      PHYSICAL EXAMINATION: ECOG PERFORMANCE STATUS: 1 - Symptomatic but completely ambulatory Vitals:   10/31/17 0916  BP: 127/61  Pulse: (!) 54  Temp: (!) 95.6 F (35.3 C)   Filed Weights   10/31/17 0916  Weight: 198 lb 2 oz (89.9 kg)    GENERAL: No distress, well nourished.  SKIN:  No rashes or significant lesions  HEAD: Normocephalic, No masses, lesions, tenderness or abnormalities  EYES: Conjunctiva are pale, non icteric ENT: External ears normal ,lips , buccal mucosa, and tongue normal and mucous membranes are moist  LYMPH: No palpable cervical and axillary lymphadenopathy  LUNGS: Clear to  auscultation, no crackles or wheezes HEART:  Loud systolic murmurs, 2+ bilateral lower extremity edema ABDOMEN: Abdomen soft, non-tender, normal bowel sounds, I did not appreciate any  masses or organomegaly  MUSCULOSKELETAL: No CVA tenderness and no tenderness on percussion of the back or rib cage.  EXTREMITIES: No edema, no skin discoloration or tenderness NEURO: Alert & oriented, no focal motor/sensory deficits.    LABORATORY DATA:  I have reviewed the data as listed Lab Results  Component Value Date   WBC 4.8 05/06/2015   HGB 9.8 (L) 05/06/2015   HCT 31.7 (L) 05/06/2015   MCV 86.9 05/06/2015   PLT 190 05/06/2015   Recent Labs    10/31/17 0950  NA 139  K 4.9  CL 107  CO2 24  GLUCOSE 130*  BUN 68*  CREATININE 2.13*  CALCIUM 9.3  GFRNONAA 23*  GFRAA 27*  PROT 6.4*  ALBUMIN 3.3*  AST 35  ALT 63*  ALKPHOS 84  BILITOT 0.7  ASSESSMENT & PLAN:  1. Deviation of international normalized ratio (INR) from target range   2. Normocytic anemia   3. Fatigue associated with anemia   4. Depression, unspecified depression type   5. Hyperthyroidism    # Discussed with patient and her daughter-in-law. Patient has mechanical valve replacement and she needs chronic anticoagulation and warfarin is the best option. I think lately her labile INRs secondary to changes of her thyroid function status. I anticipate once her thyroid function stabilized, her INR readings will be more stabilized. Currently she is on Coumadin 3 mg daily.  #Regarding anemia, we'll check CBC, CMP, reticulocyte count, SPEP, free light chain ratio, immunofixation, iron TIBC and ferritin level. She reports just having stool OCCULT cards done for 3 times and submitted yesterday. She has not heard back from her primary care physician yet. She also had folate deficiency anemia and is currently on folic acid supplement. We'll recheck folate level. We discussed little bit that if her workup is negative for  monoclonal gammopathy, or vitamin deficiencies, the most likely her chronic anemia secondary to CKD and she may benefit from Procrit. Side effects of Procrit including DVT, heart attack, stroke discussed with patient. We will have a more detailed discussion if she needs it.  # During the encounter, patient appears frustrated,she says why have to have labs done again again I just have labs done. Explained to patient that today's lab work up included some detailed workup of her anemia which were not done previously. She voices understanding but still feel unhappy about that. I asked her if she felt depressed, she did not answer and does not want to discuss any details with me.. Her daughter-in-law says she is being frustrated lately because of different physician visits. Patient is made aware that if she needs help she should let us know.   All questions were answered. The patient knows to call the clinic with any problems questions or concerns. Return of visit: 2 weeks to discuss about lab results.  Thank you for this kind referral and the opportunity to participate in the care of this patient. A copy of today's note is routed to referring provider    Earlie Server, MD, PhD Hematology Oncology [facility] Pager- [phone] 10/31/2017

## 2017-11-01 LAB — PROTEIN ELECTROPHORESIS, SERUM
A/G RATIO SPE: 1.5 (ref 0.7–1.7)
ALBUMIN ELP: 3.5 g/dL (ref 2.9–4.4)
ALPHA-1-GLOBULIN: 0.2 g/dL (ref 0.0–0.4)
ALPHA-2-GLOBULIN: 0.5 g/dL (ref 0.4–1.0)
Beta Globulin: 0.9 g/dL (ref 0.7–1.3)
GLOBULIN, TOTAL: 2.4 g/dL (ref 2.2–3.9)
Gamma Globulin: 0.7 g/dL (ref 0.4–1.8)
Total Protein ELP: 5.9 g/dL — ABNORMAL LOW (ref 6.0–8.5)

## 2017-11-01 LAB — IMMUNOFIXATION ELECTROPHORESIS
IGM (IMMUNOGLOBULIN M), SRM: 44 mg/dL (ref 26–217)
IgA: 243 mg/dL (ref 87–352)
IgG (Immunoglobin G), Serum: 793 mg/dL (ref 700–1600)
TOTAL PROTEIN ELP: 5.8 g/dL — AB (ref 6.0–8.5)

## 2017-11-01 LAB — KAPPA/LAMBDA LIGHT CHAINS
KAPPA, LAMDA LIGHT CHAIN RATIO: 1.06 (ref 0.26–1.65)
Kappa free light chain: 24 mg/L — ABNORMAL HIGH (ref 3.3–19.4)
LAMDA FREE LIGHT CHAINS: 22.6 mg/L (ref 5.7–26.3)

## 2017-11-13 NOTE — Progress Notes (Addendum)
Hematology/Oncology Follow up note [facility] Telephone:([phone] Fax:([phone]) [phone]   Patient Care Team: [name], MD as PCP - General (Internal Medicine)  REFERRING PROVIDER: [name], MD CHIEF COMPLAINTS/PURPOSE OF CONSULTATION:  Evaluation for labile INR  HISTORY OF PRESENTING ILLNESS:  Jeanette Romero is a  66 y.o.  female with PMH listed below who was referred to me for evaluation of labile INR. She has extensive cardiac comorbidities including history of aortic mechanic valve replacement, aneurysm of aorta, PAF, Coronary artery disease. She was on Amiodarone and also had drug induced hyperthyroidism.  She was sent to me for evaluation of labile INR.  Extensive review of her medical records from primary care physician's office showed  10/27/2017 INR 5.9,  10/24/2017 INR 4.9 10/12/2017 INR 1.2 10/07/2017 INR 6  10/04/2017 INR 8.1   08/11/2017 INR 7.5  05/24/2017 INR 3.4 04/26/2017 INR 3.5 03/15/2017 INR 3.4 01/31/2017 INR 2.7  08/29/2017 Folate 2.8,  B12 825 and the patient was started on folic acid supplementation.  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 Amiodarone was stopped. And patient was referred to endocrinologist for evaluation. She was found out to have Graves' disease and was started on prednisone. Last TSH that was done in the [facility] system showed a TSH of 4.8.  INTERVAL HISTORY Today patient presents for follow-up to discuss lab result and the further management for her anemia. She reports that she has just had INR checked and she was informed to continue to take Coumadin 3 mg daily. She reports feeling little better today although still feelings frustrating that she has so many doctors visits. Denies chest pain, cough,  shortness of breath, abdominal pain she has chronic leg swelling which are at baseline. Denies any fever or chills..   Review of Systems  Constitutional: Positive for malaise/fatigue. Negative for chills and fever.  HENT: Negative for hearing loss and tinnitus.   Eyes: Negative for blurred vision and double vision.  Respiratory: Positive for shortness of breath. Negative for cough.   Cardiovascular: Positive for palpitations and leg swelling. Negative for chest pain and orthopnea.  Gastrointestinal: Negative for blood in stool, nausea and vomiting.  Genitourinary: Negative for dysuria.  Musculoskeletal: Negative for myalgias.  Skin: Negative for itching and rash.  Neurological: Negative for dizziness.  Endo/Heme/Allergies: Negative for environmental allergies. Does not bruise/bleed easily.  Psychiatric/Behavioral: Positive for depression. Negative for suicidal ideas.    MEDICAL HISTORY:  Past Medical History:  Diagnosis Date  . Anxiety   . Aortic valve regurgitation   . Chronic kidney disease   . Coronary artery disease   . Dizziness   . Edema   . GERD (gastroesophageal reflux disease)   . Gout   . Hypercholesterolemia   . Hypertension   . Shortness of breath dyspnea     SURGICAL HISTORY: Past Surgical History:  Procedure Laterality Date  . ABDOMINAL HYSTERECTOMY    . AORTIC VALVE REPLACEMENT    . CARDIAC CATHETERIZATION    . CARDIAC CATHETERIZATION N/A 05/07/2015   Procedure: Left Heart Cath;  Surgeon: [name], MD;  Location: [facility] CV LAB;  Service: Cardiovascular;  Laterality: N/A;  . CARDIAC VALVE REPLACEMENT    . ELECTROPHYSIOLOGIC STUDY N/A 05/05/2015   Procedure: Cardioversion;  Surgeon: [name], MD;  Location: [facility] ORS;  Service:  Cardiovascular;  Laterality: N/A;  . REPLACEMENT TOTAL KNEE BILATERAL      SOCIAL HISTORY: Social History   Socioeconomic History  . Marital status: Married    Spouse name: Not on file  . Number of children: Not  on file  . Years of education: Not on file  . Highest education level: Not on file  Social Needs  . Financial resource strain: Not on file  . Food insecurity - worry: Not on file  . Food insecurity - inability: Not on file  . Transportation needs - medical: Not on file  . Transportation needs - non-medical: Not on file  Occupational History  . Not on file  Tobacco Use  . Smoking status: Former Smoker    Packs/day: 0.50    Years: 20.00    Pack years: 10.00    Types: Cigarettes    Last attempt to quit: 2004    Years since quitting: 14.9  . Smokeless tobacco: Never Used  Substance and Sexual Activity  . Alcohol use: Yes    Alcohol/week: 0.0 oz    Comment: rare  . Drug use: No  . Sexual activity: Not on file  Other Topics Concern  . Not on file  Social History Narrative  . Not on file    FAMILY HISTORY: Family History  Problem Relation Age of Onset  . Hypertension Mother   . Colon cancer Mother   . Prostate cancer Father   . Kidney cancer Brother   . Prostate cancer Brother   . Prostate cancer Brother   . Breast cancer Neg Hx     ALLERGIES:  has No Known Allergies.  MEDICATIONS:  Current Outpatient Medications  Medication Sig Dispense Refill  . ALPRAZolam (XANAX) 0.25 MG tablet Take 0.25 mg by mouth 2 (two) times daily.    Marland Kitchen amLODipine (NORVASC) 10 MG tablet Take 10 mg by mouth daily.    Marland Kitchen aspirin EC 81 MG tablet Take 81 mg by mouth daily.    . benazepril (LOTENSIN) 40 MG tablet Take 40 mg by mouth daily.    . cloNIDine (CATAPRES) 0.3 MG tablet Take 0.3 mg 3 (three) times daily by mouth.  2  . folic acid (FOLVITE) 1 MG tablet Take 1 mg daily by mouth.  0  . furosemide (LASIX) 40 MG tablet Take 40 mg by mouth 2 (two) times daily.     . hydrALAZINE (APRESOLINE) 100 MG tablet Take 100 mg 3 (three) times daily by mouth.    . methimazole (TAPAZOLE) 10 MG tablet Take 20 mg daily by mouth.    . predniSONE (DELTASONE) 10 MG tablet Take 40 mg daily by mouth.    . SODIUM  BICARBONATE, ANTACID, PO Take 10 mg by mouth.    . warfarin (COUMADIN) 2.5 MG tablet Take 2.5 mg by mouth daily.  0  . prazosin (MINIPRESS) 1 MG capsule Take 1 mg at bedtime by mouth.     No current facility-administered medications for this visit.      PHYSICAL EXAMINATION: ECOG PERFORMANCE STATUS: 1 - Symptomatic but completely ambulatory Vitals:   11/14/17 0929  BP: 135/65  Pulse: (!) 52  Temp: (!) 96.6 F (35.9 C)   Filed Weights   11/14/17 0929  Weight: 186 lb (84.4 kg)    Physical Exam  Constitutional: She is oriented to person, place, and time and well-developed, well-nourished, and in no distress. No distress.  HENT:  Head: Normocephalic and atraumatic.  Eyes: EOM are normal. Pupils are equal, round,  and reactive to light. No scleral icterus.  Neck: Normal range of motion. Neck supple.  Cardiovascular: Normal rate and regular rhythm.  Murmur heard. Pulmonary/Chest: Effort normal and breath sounds normal. No respiratory distress.  Abdominal: Soft. Bowel sounds are normal. She exhibits no mass.  Musculoskeletal: Normal range of motion. She exhibits no edema.  Lymphadenopathy:    She has no cervical adenopathy.  Neurological: She is alert and oriented to person, place, and time. Gait normal.  Skin: Skin is warm and dry.  Psychiatric: Affect normal.     LABORATORY DATA:  I have reviewed the data as listed Lab Results  Component Value Date   WBC 8.2 10/31/2017   HGB 7.8 (L) 10/31/2017   HCT 24.2 (L) 10/31/2017   MCV 84.9 10/31/2017   PLT 175 10/31/2017   Recent Labs    10/31/17 0950  NA 139  K 4.9  CL 107  CO2 24  GLUCOSE 130*  BUN 68*  CREATININE 2.13*  CALCIUM 9.3  GFRNONAA 23*  GFRAA 27*  PROT 6.4*  ALBUMIN 3.3*  AST 35  ALT 63*  ALKPHOS 84  BILITOT 0.7       ASSESSMENT & PLAN:  1. Normocytic anemia   2. Fatigue associated with anemia   3. Chronic anticoagulation   4. Anemia in stage 4 chronic kidney disease ([place])    # Discussed  with patient and her daughter-in-law.  regarding her mechanical valve replacement she needs to be on chronic anticoagulation with INR goal between 2.5-3.5. As her thyroid function normalized, anticipate her INR will be less fluctuated. She should continue follow-up with primary care for Coumadin dosing.   # Normocytic anemia likely secondary to chronic kidney disease. Monoclonal gammopathy workup negative. Iron panel reviewed.. I suggest IV iron infusion to further increase ferritin level to be above 300 before we utilize Procrit.  Plan IV iron with Venofer 200mg  weekly 3 doses. Allergy reactions/infusion reaction including anaphylactic reaction discussed with patient. Patient voices understanding and willing to proceed. Side effects of procrit including thrombosis risk DVT, heart attack, stroke discussed with patient and she is willing to proceed.   # Repeat folate level showed improvement. I suggest patient to continue take folic acid 1 mg daily given that mechanic heart valves potentially cause RBC destructions which may lead to folate deficiency. All questions were answered. The patient knows to call the clinic with any problems questions or concerns.  Return of visit: 4 weeks, lab, MD and possible venofer or possible procrit.   Earlie Server, MD, PhD Hematology Oncology [facility] Pager- [phone]

## 2017-11-14 ENCOUNTER — Encounter: Payer: Self-pay | Admitting: Oncology

## 2017-11-14 ENCOUNTER — Other Ambulatory Visit: Payer: Self-pay

## 2017-11-14 ENCOUNTER — Other Ambulatory Visit: Payer: Self-pay | Admitting: Internal Medicine

## 2017-11-14 ENCOUNTER — Inpatient Hospital Stay: Payer: Medicare Other | Attending: Oncology | Admitting: Oncology

## 2017-11-14 VITALS — BP 135/65 | HR 52 | Temp 96.6°F | Wt 186.0 lb

## 2017-11-14 DIAGNOSIS — Z8042 Family history of malignant neoplasm of prostate: Secondary | ICD-10-CM | POA: Diagnosis not present

## 2017-11-14 DIAGNOSIS — D631 Anemia in chronic kidney disease: Secondary | ICD-10-CM | POA: Insufficient documentation

## 2017-11-14 DIAGNOSIS — Z1231 Encounter for screening mammogram for malignant neoplasm of breast: Secondary | ICD-10-CM

## 2017-11-14 DIAGNOSIS — Z7901 Long term (current) use of anticoagulants: Secondary | ICD-10-CM | POA: Insufficient documentation

## 2017-11-14 DIAGNOSIS — R0602 Shortness of breath: Secondary | ICD-10-CM | POA: Insufficient documentation

## 2017-11-14 DIAGNOSIS — I2510 Atherosclerotic heart disease of native coronary artery without angina pectoris: Secondary | ICD-10-CM | POA: Diagnosis not present

## 2017-11-14 DIAGNOSIS — K219 Gastro-esophageal reflux disease without esophagitis: Secondary | ICD-10-CM | POA: Diagnosis not present

## 2017-11-14 DIAGNOSIS — N184 Chronic kidney disease, stage 4 (severe): Secondary | ICD-10-CM | POA: Diagnosis not present

## 2017-11-14 DIAGNOSIS — N189 Chronic kidney disease, unspecified: Secondary | ICD-10-CM

## 2017-11-14 DIAGNOSIS — M109 Gout, unspecified: Secondary | ICD-10-CM | POA: Diagnosis not present

## 2017-11-14 DIAGNOSIS — R5383 Other fatigue: Secondary | ICD-10-CM | POA: Diagnosis not present

## 2017-11-14 DIAGNOSIS — Z952 Presence of prosthetic heart valve: Secondary | ICD-10-CM | POA: Insufficient documentation

## 2017-11-14 DIAGNOSIS — Z800 Family history of malignant neoplasm of digestive organs: Secondary | ICD-10-CM | POA: Diagnosis not present

## 2017-11-14 DIAGNOSIS — Z79899 Other long term (current) drug therapy: Secondary | ICD-10-CM

## 2017-11-14 DIAGNOSIS — R531 Weakness: Secondary | ICD-10-CM

## 2017-11-14 DIAGNOSIS — E7800 Pure hypercholesterolemia, unspecified: Secondary | ICD-10-CM | POA: Diagnosis not present

## 2017-11-14 DIAGNOSIS — Z87891 Personal history of nicotine dependence: Secondary | ICD-10-CM | POA: Diagnosis not present

## 2017-11-14 DIAGNOSIS — I129 Hypertensive chronic kidney disease with stage 1 through stage 4 chronic kidney disease, or unspecified chronic kidney disease: Secondary | ICD-10-CM | POA: Insufficient documentation

## 2017-11-14 DIAGNOSIS — Z7982 Long term (current) use of aspirin: Secondary | ICD-10-CM | POA: Insufficient documentation

## 2017-11-14 DIAGNOSIS — I480 Paroxysmal atrial fibrillation: Secondary | ICD-10-CM | POA: Diagnosis not present

## 2017-11-14 DIAGNOSIS — D649 Anemia, unspecified: Secondary | ICD-10-CM

## 2017-11-14 HISTORY — DX: Chronic kidney disease, unspecified: N18.9

## 2017-11-14 HISTORY — DX: Anemia in chronic kidney disease: D63.1

## 2017-11-14 MED ORDER — FOLIC ACID 1 MG PO TABS: 1.0000 mg | ORAL_TABLET | Freq: Every day | ORAL | 3 refills | Status: DC

## 2017-11-14 NOTE — Addendum Note (Signed)
Addended by: Earlie Server on: 11/14/2017 03:16 PM   Modules accepted: Orders

## 2017-11-14 NOTE — Progress Notes (Signed)
Patient here today for follow up.  Patient states no new concerns today  

## 2017-11-16 ENCOUNTER — Other Ambulatory Visit: Payer: Self-pay

## 2017-11-16 ENCOUNTER — Inpatient Hospital Stay: Payer: Medicare Other

## 2017-11-16 ENCOUNTER — Emergency Department
Admission: EM | Admit: 2017-11-16 | Discharge: 2017-11-16 | Disposition: A | Discharge status: Home / Self Care | Payer: Medicare Other | Attending: Emergency Medicine | Admitting: Emergency Medicine

## 2017-11-16 DIAGNOSIS — Z79899 Other long term (current) drug therapy: Secondary | ICD-10-CM | POA: Insufficient documentation

## 2017-11-16 DIAGNOSIS — I129 Hypertensive chronic kidney disease with stage 1 through stage 4 chronic kidney disease, or unspecified chronic kidney disease: Secondary | ICD-10-CM | POA: Insufficient documentation

## 2017-11-16 DIAGNOSIS — D631 Anemia in chronic kidney disease: Secondary | ICD-10-CM

## 2017-11-16 DIAGNOSIS — N189 Chronic kidney disease, unspecified: Secondary | ICD-10-CM | POA: Insufficient documentation

## 2017-11-16 DIAGNOSIS — Z7982 Long term (current) use of aspirin: Secondary | ICD-10-CM | POA: Diagnosis not present

## 2017-11-16 DIAGNOSIS — Z7901 Long term (current) use of anticoagulants: Secondary | ICD-10-CM | POA: Insufficient documentation

## 2017-11-16 DIAGNOSIS — Z87891 Personal history of nicotine dependence: Secondary | ICD-10-CM | POA: Insufficient documentation

## 2017-11-16 DIAGNOSIS — R040 Epistaxis: Secondary | ICD-10-CM | POA: Diagnosis present

## 2017-11-16 DIAGNOSIS — N185 Chronic kidney disease, stage 5: Principal | ICD-10-CM

## 2017-11-16 DIAGNOSIS — I2510 Atherosclerotic heart disease of native coronary artery without angina pectoris: Secondary | ICD-10-CM | POA: Diagnosis not present

## 2017-11-16 LAB — CBC
HEMATOCRIT: 25.1 % — AB (ref 35.0–47.0)
HEMOGLOBIN: 8 g/dL — AB (ref 12.0–16.0)
MCH: 27 pg (ref 26.0–34.0)
MCHC: 31.8 g/dL — ABNORMAL LOW (ref 32.0–36.0)
MCV: 84.7 fL (ref 80.0–100.0)
PLATELETS: 171 10*3/uL (ref 150–440)
RBC: 2.96 MIL/uL — ABNORMAL LOW (ref 3.80–5.20)
RDW: 17.1 % — ABNORMAL HIGH (ref 11.5–14.5)
WBC: 7.9 10*3/uL (ref 3.6–11.0)

## 2017-11-16 LAB — PROTIME-INR
INR: 2.95
Prothrombin Time: 30.5 seconds — ABNORMAL HIGH (ref 11.4–15.2)

## 2017-11-16 MED ORDER — OXYMETAZOLINE HCL 0.05 % NA SOLN: 1.0000 | Freq: Once | NASAL | Status: DC

## 2017-11-16 MED ORDER — SODIUM CHLORIDE 0.9 % IV SOLN
Freq: Once | INTRAVENOUS | Status: AC
  Administered 2017-11-16: 12:00:00 via INTRAVENOUS
  Filled 2017-11-16: qty 1000

## 2017-11-16 MED ORDER — IRON SUCROSE 20 MG/ML IV SOLN
200.0000 mg | Freq: Once | INTRAVENOUS | Status: AC
  Administered 2017-11-16: 200 mg via INTRAVENOUS
  Filled 2017-11-16: qty 10

## 2017-11-16 NOTE — ED Provider Notes (Signed)
[facility] Emergency Department Provider Note  ____________________________________________   First MD Initiated Contact with Patient 11/16/17 1630     (approximate)  I have reviewed the triage vital signs and the nursing notes.   HISTORY  Chief Complaint Epistaxis    HPI Jeanette Romero is a 66 y.o. female complains of a nosebleed from the left nare, she states it started this morning, she states it stopped after she got here and they had applied the clamps, she is on Coumadin for an artificial valve, but she states she wears a CPAP machine at night and feels like her nose is getting dried out, she denies any injury, she denies blood in the back of her throat, she denies fever or chills  Past Medical History:  Diagnosis Date  . Anemia in chronic kidney disease (CKD) 11/14/2017  . Anxiety   . Aortic valve regurgitation   . Chronic kidney disease   . Coronary artery disease   . Dizziness   . Edema   . GERD (gastroesophageal reflux disease)   . Gout   . Hypercholesterolemia   . Hypertension   . Shortness of breath dyspnea     Patient Active Problem List   Diagnosis Date Noted  . Anemia in chronic kidney disease (CKD) 11/14/2017  . Normocytic anemia 10/31/2017  . Fatigue associated with anemia 10/31/2017  . Depression 10/31/2017  . Hyperthyroidism 10/31/2017  . Chronic anticoagulation 10/31/2017  . Bradycardia 05/05/2015    Past Surgical History:  Procedure Laterality Date  . ABDOMINAL HYSTERECTOMY    . AORTIC VALVE REPLACEMENT    . CARDIAC CATHETERIZATION    . CARDIAC CATHETERIZATION N/A 05/07/2015   Procedure: Left Heart Cath;  Surgeon: [name], MD;  Location: [facility];  Service: Cardiovascular;  Laterality: N/A;  . CARDIAC VALVE REPLACEMENT    . ELECTROPHYSIOLOGIC STUDY N/A 05/05/2015   Procedure: Cardioversion;  Surgeon: [name], MD;  Location: [facility];  Service: Cardiovascular;  Laterality: N/A;  .  REPLACEMENT TOTAL KNEE BILATERAL      Prior to Admission medications   Medication Sig Start Date End Date Taking? Authorizing Provider  ALPRAZolam (XANAX) 0.25 MG tablet Take 0.25 mg by mouth 2 (two) times daily.    [provider]  amLODipine (NORVASC) 10 MG tablet Take 10 mg by mouth daily.    [provider]  aspirin EC 81 MG tablet Take 81 mg by mouth daily.    [provider]  benazepril (LOTENSIN) 40 MG tablet Take 40 mg by mouth daily.    [provider]  cloNIDine (CATAPRES) 0.3 MG tablet Take 0.3 mg 3 (three) times daily by mouth. 10/14/17   [provider]  folic acid (FOLVITE) 1 MG tablet Take 1 tablet (1 mg total) by mouth daily. 11/14/17   Earlie Server, MD  furosemide (LASIX) 40 MG tablet Take 40 mg by mouth 2 (two) times daily.  10/17/17 10/17/18  [provider]  hydrALAZINE (APRESOLINE) 100 MG tablet Take 100 mg 3 (three) times daily by mouth.    [provider]  methimazole (TAPAZOLE) 10 MG tablet Take 20 mg daily by mouth. 10/27/17 12/26/17  [provider]  prazosin (MINIPRESS) 1 MG capsule Take 1 mg at bedtime by mouth. 10/17/17 10/17/18  [provider]  predniSONE (DELTASONE) 10 MG tablet Take 40 mg daily by mouth. 10/21/17 11/20/17  [provider]  SODIUM BICARBONATE, ANTACID, PO Take 10 mg by mouth.  [provider]  warfarin (COUMADIN) 2.5 MG tablet Take 2.5 mg by mouth daily. 11/07/17   [provider]    Allergies Patient has no known allergies.  Family History  Problem Relation Age of Onset  . Hypertension Mother   . Colon cancer Mother   . Prostate cancer Father   . Kidney cancer Brother   . Prostate cancer Brother   . Prostate cancer Brother   . Breast cancer Neg Hx     Social History Social History   Tobacco Use  . Smoking status: Former Smoker    Packs/day: 0.50    Years: 20.00    Pack years: 10.00    Types: Cigarettes    Last attempt to quit:  2004    Years since quitting: 14.9  . Smokeless tobacco: Never Used  Substance Use Topics  . Alcohol use: Yes    Alcohol/week: 0.0 oz    Comment: rare  . Drug use: No    Review of Systems  Constitutional: No fever/chills Eyes: No visual changes. ENT: No sore throat.  Positive for nosebleeds Respiratory: Denies cough Genitourinary: Negative for dysuria. Musculoskeletal: Negative for back pain. Skin: Negative for rash.    ____________________________________________   PHYSICAL EXAM:  VITAL SIGNS: ED Triage Vitals  Enc Vitals Group     BP 11/16/17 1450 (!) 150/73     Pulse Rate 11/16/17 1450 77     Resp 11/16/17 1450 16     Temp 11/16/17 1450 98.4 F (36.9 C)     Temp Source 11/16/17 1450 Oral     SpO2 11/16/17 1450 98 %     Weight 11/16/17 1451 185 lb (83.9 kg)     Height 11/16/17 1451 5\' 2"  (1.575 m)     Head Circumference --      Peak Flow --      Pain Score 11/16/17 1450 0     Pain Loc --      Pain Edu? --      Excl. in [place]? --     Constitutional: Alert and oriented. Well appearing and in no acute distress. Eyes: Conjunctivae are normal.  Head: Atraumatic. Nose: No congestion/rhinnorhea.  Positive for dried blood in the left nare, patient was instructed to blow her nose gently, no clot was removed, examined and there afterwards and there is no active bleeding Mouth/Throat: Mucous membranes are moist.  No blood is noticed in the posterior of the throat Cardiovascular: Normal rate, regular rhythm. Respiratory: Normal respiratory effort.  No retractions, lungs are clear to auscultation GU: deferred Musculoskeletal: FROM all extremities, warm and well perfused Neurologic:  Normal speech and language.  Skin:  Skin is warm, dry and intact. No rash noted.  No bruising noted Psychiatric: Mood and affect are normal. Speech and behavior are normal.  ____________________________________________   LABS (all labs ordered are listed, but only abnormal results are  displayed)  Labs Reviewed  CBC - Abnormal; Notable for the following components:      Result Value   RBC 2.96 (*)    Hemoglobin 8.0 (*)    HCT 25.1 (*)    MCHC 31.8 (*)    RDW 17.1 (*)    All other components within normal limits  PROTIME-INR - Abnormal; Notable for the following components:   Prothrombin Time 30.5 (*)    All other components within normal limits   ____________________________________________   ____________________________________________  RADIOLOGY    ____________________________________________   PROCEDURES  Procedure(s) performed: No  ____________________________________________   INITIAL IMPRESSION / ASSESSMENT AND PLAN / ED COURSE  Pertinent labs & imaging results that were available during my care of the patient were reviewed by me and considered in my medical decision making (see chart for details).  Patient is a 66 year old female with a nosebleed from the left nare, she is on Coumadin, her INR was 2.9, her blood cell counts are similar to her previous results, her platelets are normal, on physical exam the left nare does not show any active bleeding, the patient states the bleeding stopped after the clamps were applied, patient was instructed to apply Vaseline in the nare especially at night when she uses her CPAP machine, she is to check her CPAP machine to make sure the humidifier is working, if she continues to have nosebleeds she should see her regular doctor, if the nosebleed lasts more than 20 minutes she should return to the emergency department, if she is worsening she should return to the emergency department, patient and her family state they understand and agree with treatment plan      ____________________________________________   FINAL CLINICAL IMPRESSION(S) / ED DIAGNOSES  Final diagnoses:  Left-sided epistaxis      NEW MEDICATIONS STARTED DURING THIS VISIT:  This SmartLink is deprecated. Use AVSMEDLIST instead  to display the medication list for a patient.   Note:  This document was prepared using Dragon voice recognition software and may include unintentional dictation errors.    Versie Starks, PA 11/16/17 1644    Harvest Dark, MD 11/16/17 Despina Pole

## 2017-11-16 NOTE — ED Triage Notes (Signed)
Pt c/o having nose bleed from the left nare this since this morning, states it stopped for a little while, while at [facility] getting a routine iron infusion and then it started back again after. States she is on coumadin. No bleeding at this time.

## 2017-11-16 NOTE — Discharge Instructions (Signed)
Follow-up with your regular doctor if you have any more problems, blow gently when you are blowing your nose, use Vaseline in the nares at night, make sure your humidifier is working on your CPAP, if your nose starts to bleed use the clamps and applied pressure for at least 20 minutes, if the bleeding does not stop please return to the emergency room , return to the emergency room if you are worsening

## 2017-11-16 NOTE — ED Notes (Signed)
AFrin discontinued by Ashok Cordia PA

## 2017-11-17 ENCOUNTER — Ambulatory Visit: Payer: Medicare Other

## 2017-11-24 ENCOUNTER — Inpatient Hospital Stay: Payer: Medicare Other

## 2017-11-24 VITALS — BP 130/83 | HR 82 | Temp 97.9°F | Resp 16 | Wt 184.7 lb

## 2017-11-24 DIAGNOSIS — D631 Anemia in chronic kidney disease: Secondary | ICD-10-CM

## 2017-11-24 DIAGNOSIS — N185 Chronic kidney disease, stage 5: Principal | ICD-10-CM

## 2017-11-24 DIAGNOSIS — I129 Hypertensive chronic kidney disease with stage 1 through stage 4 chronic kidney disease, or unspecified chronic kidney disease: Secondary | ICD-10-CM | POA: Diagnosis not present

## 2017-11-24 MED ORDER — SODIUM CHLORIDE 0.9 % IV SOLN
Freq: Once | INTRAVENOUS | Status: AC
  Administered 2017-11-24: 12:00:00 via INTRAVENOUS
  Filled 2017-11-24: qty 1000

## 2017-11-24 MED ORDER — IRON SUCROSE 20 MG/ML IV SOLN
200.0000 mg | Freq: Once | INTRAVENOUS | Status: AC
Start: 2017-11-24 — End: 2017-11-24
  Administered 2017-11-24: 200 mg via INTRAVENOUS
  Filled 2017-11-24: qty 10

## 2017-12-01 ENCOUNTER — Inpatient Hospital Stay: Payer: Medicare Other

## 2017-12-01 VITALS — BP 101/63 | HR 61 | Temp 97.7°F | Resp 20

## 2017-12-01 DIAGNOSIS — I129 Hypertensive chronic kidney disease with stage 1 through stage 4 chronic kidney disease, or unspecified chronic kidney disease: Secondary | ICD-10-CM | POA: Diagnosis not present

## 2017-12-01 DIAGNOSIS — N185 Chronic kidney disease, stage 5: Principal | ICD-10-CM

## 2017-12-01 DIAGNOSIS — D631 Anemia in chronic kidney disease: Secondary | ICD-10-CM

## 2017-12-01 MED ORDER — IRON SUCROSE 20 MG/ML IV SOLN
200.0000 mg | Freq: Once | INTRAVENOUS | Status: AC
  Administered 2017-12-01: 200 mg via INTRAVENOUS
  Filled 2017-12-01: qty 10

## 2017-12-01 MED ORDER — SODIUM CHLORIDE 0.9 % IV SOLN
Freq: Once | INTRAVENOUS | Status: AC
  Administered 2017-12-01: 12:00:00 via INTRAVENOUS
  Filled 2017-12-01: qty 1000

## 2017-12-13 ENCOUNTER — Inpatient Hospital Stay: Payer: Medicare Other | Attending: Oncology

## 2017-12-13 DIAGNOSIS — N184 Chronic kidney disease, stage 4 (severe): Secondary | ICD-10-CM | POA: Insufficient documentation

## 2017-12-13 DIAGNOSIS — I129 Hypertensive chronic kidney disease with stage 1 through stage 4 chronic kidney disease, or unspecified chronic kidney disease: Secondary | ICD-10-CM | POA: Insufficient documentation

## 2017-12-13 DIAGNOSIS — D649 Anemia, unspecified: Secondary | ICD-10-CM

## 2017-12-13 DIAGNOSIS — D631 Anemia in chronic kidney disease: Secondary | ICD-10-CM | POA: Insufficient documentation

## 2017-12-13 DIAGNOSIS — Z7901 Long term (current) use of anticoagulants: Secondary | ICD-10-CM | POA: Diagnosis not present

## 2017-12-13 LAB — CBC WITH DIFFERENTIAL/PLATELET
Basophils Absolute: 0 10*3/uL (ref 0–0.1)
Basophils Relative: 1 %
Eosinophils Absolute: 0 10*3/uL (ref 0–0.7)
Eosinophils Relative: 1 %
HEMATOCRIT: 27.2 % — AB (ref 35.0–47.0)
HEMOGLOBIN: 8.7 g/dL — AB (ref 12.0–16.0)
LYMPHS ABS: 0.6 10*3/uL — AB (ref 1.0–3.6)
Lymphocytes Relative: 9 %
MCH: 28.8 pg (ref 26.0–34.0)
MCHC: 32.1 g/dL (ref 32.0–36.0)
MCV: 89.5 fL (ref 80.0–100.0)
Monocytes Absolute: 0.6 10*3/uL (ref 0.2–0.9)
Monocytes Relative: 9 %
NEUTROS ABS: 5.1 10*3/uL (ref 1.4–6.5)
NEUTROS PCT: 80 %
Platelets: 119 10*3/uL — ABNORMAL LOW (ref 150–440)
RBC: 3.04 MIL/uL — ABNORMAL LOW (ref 3.80–5.20)
RDW: 18.2 % — ABNORMAL HIGH (ref 11.5–14.5)
WBC: 6.3 10*3/uL (ref 3.6–11.0)

## 2017-12-13 LAB — IRON AND TIBC
Iron: 52 ug/dL (ref 28–170)
Saturation Ratios: 19 % (ref 10.4–31.8)
TIBC: 271 ug/dL (ref 250–450)
UIBC: 219 ug/dL

## 2017-12-13 LAB — FERRITIN: Ferritin: 344 ng/mL — ABNORMAL HIGH (ref 11–307)

## 2017-12-14 ENCOUNTER — Other Ambulatory Visit: Payer: Self-pay | Admitting: Oncology

## 2017-12-15 ENCOUNTER — Inpatient Hospital Stay: Payer: Medicare Other | Admitting: Oncology

## 2017-12-15 ENCOUNTER — Inpatient Hospital Stay: Payer: Medicare Other

## 2017-12-17 NOTE — Progress Notes (Signed)
Hematology/Oncology Follow up note [facility] [phone] [phone]   Patient Care Team: [name], MD as PCP - General (Internal Medicine)  REFERRING PROVIDER: [name], MD CHIEF COMPLAINTS/PURPOSE OF CONSULTATION:  Evaluation for labile INR  HISTORY OF PRESENTING ILLNESS:  Jeanette Romero is a  67 y.o.  female with PMH listed below who was referred to me for evaluation of labile INR. She has extensive cardiac comorbidities including history of aortic mechanic valve replacement, aneurysm of aorta, PAF, Coronary artery disease. She was on Amiodarone and also had drug induced hyperthyroidism.  She was sent to me for evaluation of labile INR.  Extensive review of her medical records from primary care physician's office showed  10/27/2017 INR 5.9,  10/24/2017 INR 4.9 10/12/2017 INR 1.2 10/07/2017 INR 6  10/04/2017 INR 8.1   08/11/2017 INR 7.5  05/24/2017 INR 3.4 04/26/2017 INR 3.5 03/15/2017 INR 3.4 01/31/2017 INR 2.7  08/29/2017 Folate 2.8,  B12 825 and the patient was started on folic acid supplementation.  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 Amiodarone was stopped. And patient was referred to endocrinologist for evaluation. She was found out to have Graves' disease and was started on prednisone. Last TSH that was done in the [facility] system showed a TSH of 4.8.  INTERVAL HISTORY Today patient presents for follow-up to discuss lab result and the further management for her anemia. During the interval, patient had got IV iron infusion. Patient reports subjectively she is feeling much better. She continues follow up for INR checks with primary care physician and endocrinologist for titrating her thyroid function.   she has chronic  leg swelling which are at baseline. Mode also improved. Denies any fever or chills.. Today she denies any chest pain, shortness of breath, abdominal pain.   Review of Systems  Constitutional: Negative for chills, fever and malaise/fatigue.  HENT: Negative for hearing loss and tinnitus.   Eyes: Negative for blurred vision and double vision.  Respiratory: Negative for cough and shortness of breath.   Cardiovascular: Positive for leg swelling. Negative for chest pain, palpitations and orthopnea.  Gastrointestinal: Negative for blood in stool, nausea and vomiting.  Genitourinary: Negative for dysuria.  Musculoskeletal: Negative for myalgias.  Skin: Negative for itching and rash.  Neurological: Negative for dizziness.  Endo/Heme/Allergies: Negative for environmental allergies. Does not bruise/bleed easily.  Psychiatric/Behavioral: Negative for depression.    MEDICAL HISTORY:  Past Medical History:  Diagnosis Date  . Anemia in chronic kidney disease (CKD) 11/14/2017  . Anxiety   . Aortic valve regurgitation   . Chronic kidney disease   . Coronary artery disease   . Dizziness   . Edema   . GERD (gastroesophageal reflux disease)   . Gout   . Hypercholesterolemia   . Hypertension   . Shortness of breath dyspnea     SURGICAL HISTORY: Past Surgical History:  Procedure Laterality Date  . ABDOMINAL HYSTERECTOMY    . AORTIC VALVE REPLACEMENT    . CARDIAC CATHETERIZATION    . CARDIAC CATHETERIZATION N/A 05/07/2015   Procedure: Left Heart Cath;  Surgeon: [name], MD;  Location: [facility];  Service: Cardiovascular;  Laterality: N/A;  . CARDIAC VALVE REPLACEMENT    . ELECTROPHYSIOLOGIC STUDY N/A 05/05/2015   Procedure: Cardioversion;  Surgeon: [name], MD;  Location: [facility];  Service: Cardiovascular;  Laterality: N/A;  . REPLACEMENT TOTAL KNEE BILATERAL      SOCIAL HISTORY: Social History   Socioeconomic History  . Marital status: Married    Spouse name: Not  on file  . Number of children: Not on file  . Years of education: Not on file  . Highest education level: Not on file  Social Needs  . Financial resource strain: Not on file  . Food insecurity - worry: Not on file  . Food insecurity - inability: Not on file  . Transportation needs - medical: Not on file  . Transportation needs - non-medical: Not on file  Occupational History  . Not on file  Tobacco Use  . Smoking status: Former Smoker    Packs/day: 0.50    Years: 20.00    Pack years: 10.00    Types: Cigarettes    Last attempt to quit: 2004    Years since quitting: 15.0  . Smokeless tobacco: Never Used  Substance and Sexual Activity  . Alcohol use: Yes    Alcohol/week: 0.0 oz    Comment: rare  . Drug use: No  . Sexual activity: Not on file  Other Topics Concern  . Not on file  Social History Narrative  . Not on file    FAMILY HISTORY: Family History  Problem Relation Age of Onset  . Hypertension Mother   . Colon cancer Mother   . Prostate cancer Father   . Kidney cancer Brother   . Prostate cancer Brother   . Prostate cancer Brother   . Breast cancer Neg Hx     ALLERGIES:  has No Known Allergies.  MEDICATIONS:  Current Outpatient Medications  Medication Sig Dispense Refill  . ALPRAZolam (XANAX) 0.25 MG tablet Take 0.25 mg by mouth 2 (two) times daily.    Marland Kitchen amLODipine (NORVASC) 10 MG tablet Take 10 mg by mouth daily.    Marland Kitchen aspirin EC 81 MG tablet Take 81 mg by mouth daily.    . benazepril (LOTENSIN) 40 MG tablet Take 40 mg by mouth daily.    . cloNIDine (CATAPRES) 0.3 MG tablet Take 0.3 mg 3 (three) times daily by mouth.  2  . folic acid (FOLVITE) 1 MG tablet Take 1 tablet (1 mg total) by mouth daily. 30 tablet 3  . furosemide (LASIX) 40 MG tablet Take 40 mg by mouth 2 (two) times daily.     . hydrALAZINE (APRESOLINE) 100 MG tablet Take 100 mg 3 (three) times daily by mouth.    . methimazole (TAPAZOLE) 10 MG tablet Take 20 mg daily by mouth.    . prazosin  (MINIPRESS) 1 MG capsule Take 1 mg at bedtime by mouth.    . SODIUM BICARBONATE, ANTACID, PO Take 10 mg by mouth.    . warfarin (COUMADIN) 2.5 MG tablet Take 2.5 mg by mouth daily.  0   No current facility-administered medications for this visit.      PHYSICAL EXAMINATION: ECOG PERFORMANCE STATUS: 1 - Symptomatic but completely ambulatory Vitals:   12/18/17 1407 12/18/17 1411  BP:  (!) 157/74  Pulse:  (!) 52  Resp: 16   Temp:  (!) 97.2 F (36.2 C)   Filed Weights   12/18/17 1407  Weight: 188 lb 6.4 oz (85.5 kg)    Physical Exam  Constitutional: She is oriented to person, place, and time and well-developed, well-nourished, and in no distress. No distress.  HENT:  Head: Normocephalic and atraumatic.  Mouth/Throat: No  oropharyngeal exudate.  Eyes: EOM are normal. Pupils are equal, round, and reactive to light. Left eye exhibits no discharge. No scleral icterus.  Neck: Normal range of motion. Neck supple. No JVD present.  Cardiovascular: Normal rate and regular rhythm.  Murmur heard. Pulmonary/Chest: Effort normal and breath sounds normal. No respiratory distress. She exhibits no tenderness.  Abdominal: Soft. Bowel sounds are normal. She exhibits no distension and no mass. There is no tenderness.  Musculoskeletal: Normal range of motion. She exhibits no edema or tenderness.  Lymphadenopathy:    She has no cervical adenopathy.  Neurological: She is alert and oriented to person, place, and time. Gait normal.  Skin: Skin is warm and dry. No erythema.  Psychiatric: Affect and judgment normal.     LABORATORY DATA:  I have reviewed the data as listed Lab Results  Component Value Date   WBC 6.3 12/13/2017   HGB 8.7 (L) 12/13/2017   HCT 27.2 (L) 12/13/2017   MCV 89.5 12/13/2017   PLT 119 (L) 12/13/2017   Recent Labs    10/31/17 0950  NA 139  K 4.9  CL 107  CO2 24  GLUCOSE 130*  BUN 68*  CREATININE 2.13*  CALCIUM 9.3  GFRNONAA 23*  GFRAA 27*  PROT 6.4*  ALBUMIN  3.3*  AST 35  ALT 63*  ALKPHOS 84  BILITOT 0.7   Iron/TIBC/Ferritin/ %Sat    Component Value Date/Time   IRON 52 12/13/2017 0809   TIBC 271 12/13/2017 0809   FERRITIN 344 (H) 12/13/2017 0809   IRONPCTSAT 19 12/13/2017 0809      ASSESSMENT & PLAN:  1. Anemia in stage 4 chronic kidney disease ([place])   2. Normocytic anemia   3. Fatigue associated with anemia   4. Chronic anticoagulation    # The patient appears feeling better after IV iron infusion. Her hemoglobin improved slightly, still less than 10. Discussed with patient about Procrit. Rationale of using Procrit and potential side effects in including life-threatening thrombosis were discussed with patient. She voices understanding is willing to proceed with Procrit. We'll start her on low-dose today and reevaluate if any hematological response in 2-3 weeks.  #  continue following up with primary care physician for INR monitoring. Agree with long-term anticoagulation with INR goal between 2.5-3.5.    # Repeat folate level showed improvemencontinued folic acid 1 mg daily.  All questions were answered. The patient knows to call the clinic with any problems questions or concerns.  Return of visit: 3 weeks, lab, MD and possible  procrit.   Jeanette Server, MD, PhD Hematology Oncology [facility] Pager- [phone]

## 2017-12-18 ENCOUNTER — Inpatient Hospital Stay (HOSPITAL_BASED_OUTPATIENT_CLINIC_OR_DEPARTMENT_OTHER): Payer: Medicare Other | Admitting: Oncology

## 2017-12-18 ENCOUNTER — Other Ambulatory Visit: Payer: Self-pay

## 2017-12-18 ENCOUNTER — Inpatient Hospital Stay: Payer: Medicare Other

## 2017-12-18 ENCOUNTER — Encounter: Payer: Self-pay | Admitting: Oncology

## 2017-12-18 VITALS — BP 157/74 | HR 52 | Temp 97.2°F | Resp 16 | Ht 62.5 in | Wt 188.4 lb

## 2017-12-18 DIAGNOSIS — N184 Chronic kidney disease, stage 4 (severe): Secondary | ICD-10-CM | POA: Diagnosis not present

## 2017-12-18 DIAGNOSIS — Z87891 Personal history of nicotine dependence: Secondary | ICD-10-CM

## 2017-12-18 DIAGNOSIS — R5383 Other fatigue: Secondary | ICD-10-CM | POA: Diagnosis not present

## 2017-12-18 DIAGNOSIS — N185 Chronic kidney disease, stage 5: Principal | ICD-10-CM

## 2017-12-18 DIAGNOSIS — D631 Anemia in chronic kidney disease: Secondary | ICD-10-CM | POA: Diagnosis not present

## 2017-12-18 DIAGNOSIS — D649 Anemia, unspecified: Secondary | ICD-10-CM

## 2017-12-18 DIAGNOSIS — M7989 Other specified soft tissue disorders: Secondary | ICD-10-CM

## 2017-12-18 DIAGNOSIS — Z7901 Long term (current) use of anticoagulants: Secondary | ICD-10-CM

## 2017-12-18 MED ORDER — EPOETIN ALFA 20000 UNIT/ML IJ SOLN
20000.0000 [IU] | Freq: Once | INTRAMUSCULAR | Status: AC
  Administered 2017-12-18: 20000 [IU] via SUBCUTANEOUS
  Filled 2017-12-18: qty 1

## 2017-12-18 NOTE — Progress Notes (Signed)
Patient here for pre treatment check. She states she feels better now after the infusions.

## 2017-12-21 ENCOUNTER — Ambulatory Visit
Admission: RE | Admit: 2017-12-21 | Discharge: 2017-12-21 | Disposition: A | Discharge status: Home / Self Care | Payer: Medicare Other | Source: Ambulatory Visit | Attending: Internal Medicine | Admitting: Internal Medicine

## 2017-12-21 DIAGNOSIS — Z1231 Encounter for screening mammogram for malignant neoplasm of breast: Secondary | ICD-10-CM | POA: Insufficient documentation

## 2018-01-05 ENCOUNTER — Other Ambulatory Visit: Payer: Self-pay

## 2018-01-05 ENCOUNTER — Inpatient Hospital Stay: Payer: Medicare Other

## 2018-01-05 DIAGNOSIS — D649 Anemia, unspecified: Secondary | ICD-10-CM

## 2018-01-05 DIAGNOSIS — N184 Chronic kidney disease, stage 4 (severe): Secondary | ICD-10-CM | POA: Diagnosis not present

## 2018-01-05 LAB — CBC
HEMATOCRIT: 26.5 % — AB (ref 35.0–47.0)
Hemoglobin: 8.5 g/dL — ABNORMAL LOW (ref 12.0–16.0)
MCH: 28.8 pg (ref 26.0–34.0)
MCHC: 32.2 g/dL (ref 32.0–36.0)
MCV: 89.3 fL (ref 80.0–100.0)
PLATELETS: 168 10*3/uL (ref 150–440)
RBC: 2.97 MIL/uL — ABNORMAL LOW (ref 3.80–5.20)
RDW: 17.5 % — AB (ref 11.5–14.5)
WBC: 7.6 10*3/uL (ref 3.6–11.0)

## 2018-01-07 NOTE — Progress Notes (Signed)
Hematology/Oncology Follow up note [facility] Telephone:(336) [phone] Fax:(336) [phone]   Patient Care Team: [name], MD as PCP - General (Internal Medicine)  REFERRING PROVIDER: [name], MD CHIEF COMPLAINTS/PURPOSE OF CONSULTATION:  Evaluation for labile INR  HISTORY OF PRESENTING ILLNESS:  Jeanette Romero is a  67 y.o.  female with PMH listed below who was referred to me for evaluation of labile INR. She has extensive cardiac comorbidities including history of aortic mechanic valve replacement, aneurysm of aorta, PAF, Coronary artery disease. She was on Amiodarone and also had drug induced hyperthyroidism.  She was sent to me for evaluation of labile INR.  Extensive review of her medical records from primary care physician's office showed  10/27/2017 INR 5.9,  10/24/2017 INR 4.9 10/12/2017 INR 1.2 10/07/2017 INR 6  10/04/2017 INR 8.1   08/11/2017 INR 7.5  05/24/2017 INR 3.4 04/26/2017 INR 3.5 03/15/2017 INR 3.4 01/31/2017 INR 2.7  08/29/2017 Folate 2.8,  B12 825 and the patient was started on folic acid supplementation.  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 Amiodarone was stopped. And patient was referred to endocrinologist for evaluation. She was found out to have Graves' disease and was started on prednisone. Last TSH that was done in the [facility] system showed a TSH of 4.8.  INTERVAL HISTORY Today patient presents for follow-up for management for her anemia. She has received procrit 20,000 units a few weeks ago. Her hemoglobin has not responded much, and has slightly decreased.  Patient reports that her shortness of breath has been completely resolved since her thyroid function was corrected.  However 2 days ago she started to not feeling  well and yesterday she started to feel more shortness of breath with exertion. She has established care with nephrologist [place].  She was instructed to stop Lasix 40 mg daily as it was not considered to be beneficial.  Her lower extremity edema is considered to be secondary to the anasarca due to proteinuria.  She stopped Lasix about 4-5 days ago. Denies any cough, hemoptysis, chest pain.  She has chronic lower extremity edema.   Review of Systems  Constitutional: Negative for chills, fever and malaise/fatigue.  HENT: Negative for congestion, hearing loss and tinnitus.   Eyes: Negative for blurred vision and double vision.  Respiratory: Positive for shortness of breath. Negative for cough.   Cardiovascular: Positive for leg swelling. Negative for chest pain, palpitations and orthopnea.  Gastrointestinal: Negative for blood in stool and vomiting.  Musculoskeletal: Negative for myalgias.  Skin: Negative for itching and rash.  Neurological: Negative for dizziness.  Endo/Heme/Allergies: Does not bruise/bleed easily.  Psychiatric/Behavioral: The patient is not nervous/anxious.     MEDICAL HISTORY:  Past Medical History:  Diagnosis Date  . Anemia in chronic kidney disease (CKD) 11/14/2017  . Anxiety   . Aortic valve regurgitation   . Chronic kidney disease   . Coronary artery disease   . Dizziness   . Edema   . GERD (gastroesophageal reflux disease)   . Gout   . Hypercholesterolemia   . Hypertension   . Shortness of breath dyspnea     SURGICAL HISTORY: Past Surgical History:  Procedure Laterality Date  . ABDOMINAL HYSTERECTOMY    . AORTIC VALVE REPLACEMENT    . CARDIAC CATHETERIZATION    . CARDIAC CATHETERIZATION N/A 05/07/2015  Procedure: Left Heart Cath;  Surgeon: [name], MD;  Location: [facility] [place] [facility];  Service: Cardiovascular;  Laterality: N/A;  . CARDIAC VALVE REPLACEMENT    . ELECTROPHYSIOLOGIC STUDY N/A 05/05/2015   Procedure: Cardioversion;  Surgeon:  [name], MD;  Location: [facility];  Service: Cardiovascular;  Laterality: N/A;  . REPLACEMENT TOTAL KNEE BILATERAL      SOCIAL HISTORY: Social History   Socioeconomic History  . Marital status: Married    Spouse name: Not on file  . Number of children: Not on file  . Years of education: Not on file  . Highest education level: Not on file  Social Needs  . Financial resource strain: Not on file  . Food insecurity - worry: Not on file  . Food insecurity - inability: Not on file  . Transportation needs - medical: Not on file  . Transportation needs - non-medical: Not on file  Occupational History  . Not on file  Tobacco Use  . Smoking status: Former Smoker    Packs/day: 0.50    Years: 20.00    Pack years: 10.00    Types: Cigarettes    Last attempt to quit: 2004    Years since quitting: 15.0  . Smokeless tobacco: Never Used  Substance and Sexual Activity  . Alcohol use: Yes    Alcohol/week: 0.0 oz    Comment: rare  . Drug use: No  . Sexual activity: Not on file  Other Topics Concern  . Not on file  Social History Narrative  . Not on file    FAMILY HISTORY: Family History  Problem Relation Age of Onset  . Hypertension Mother   . Colon cancer Mother   . Prostate cancer Father   . Kidney cancer Brother   . Prostate cancer Brother   . Prostate cancer Brother   . Breast cancer Neg Hx     ALLERGIES:  has No Known Allergies.  MEDICATIONS:  Current Outpatient Medications  Medication Sig Dispense Refill  . ALPRAZolam (XANAX) 0.25 MG tablet Take 0.25 mg by mouth 2 (two) times daily.    Marland Kitchen amLODipine (NORVASC) 10 MG tablet Take 5 mg by mouth daily.     Marland Kitchen aspirin EC 81 MG tablet Take 81 mg by mouth daily.    . benazepril (LOTENSIN) 40 MG tablet Take 40 mg by mouth daily.    . cloNIDine (CATAPRES) 0.3 MG tablet Take 0.3 mg 3 (three) times daily by mouth.  2  . folic acid (FOLVITE) 1 MG tablet Take 1 tablet (1 mg total) by mouth daily. 30 tablet 3  . furosemide  (LASIX) 40 MG tablet Take 40 mg by mouth 2 (two) times daily.     . hydrALAZINE (APRESOLINE) 100 MG tablet Take 100 mg 3 (three) times daily by mouth.    . prazosin (MINIPRESS) 1 MG capsule Take 1 mg at bedtime by mouth.    . SODIUM BICARBONATE, ANTACID, PO Take 10 mg by mouth.    . Vitamin D, Ergocalciferol, (DRISDOL) 50000 units CAPS capsule Take 50,000 Units by mouth once a week.    . warfarin (COUMADIN) 2.5 MG tablet Take 3 mg by mouth daily.   0  . methimazole (TAPAZOLE) 10 MG tablet Take 20 mg daily by mouth.    . predniSONE (DELTASONE) 10 MG tablet Take 10 mg by mouth daily.  1   No current facility-administered medications for this visit.      PHYSICAL EXAMINATION: ECOG PERFORMANCE STATUS: 1 -  Symptomatic but completely ambulatory Vitals:   01/08/18 1149 01/08/18 1223  BP: (!) 157/73   Pulse: (!) 55   Temp: 98.7 F (37.1 C)   SpO2:  97%   Filed Weights   01/08/18 1149  Weight: 195 lb 8 oz (88.7 kg)    Physical Exam  Constitutional: She is oriented to person, place, and time and well-developed, well-nourished, and in no distress. No distress.  HENT:  Head: Normocephalic and atraumatic.  Mouth/Throat: Oropharynx is clear and moist. No oropharyngeal exudate.  Eyes: EOM are normal. Pupils are equal, round, and reactive to light. Left eye exhibits no discharge. No scleral icterus.  Neck: Normal range of motion. Neck supple. No JVD present.  Cardiovascular: Normal rate and regular rhythm.  Murmur heard. Pulmonary/Chest: Effort normal. No respiratory distress. She has no wheezes. She exhibits no tenderness.  Right lower lobe crackles.  Abdominal: Soft. Bowel sounds are normal. She exhibits no distension and no mass. There is no tenderness.  Musculoskeletal: Normal range of motion. She exhibits edema. She exhibits no tenderness.  Lymphadenopathy:    She has no cervical adenopathy.  Neurological: She is alert and oriented to person, place, and time. Gait normal.  Skin: Skin  is warm and dry. She is not diaphoretic. No erythema.  Psychiatric: Judgment normal.  Flat affect     LABORATORY DATA:  I have reviewed the data as listed Lab Results  Component Value Date   WBC 7.6 01/05/2018   HGB 8.5 (L) 01/05/2018   HCT 26.5 (L) 01/05/2018   MCV 89.3 01/05/2018   PLT 168 01/05/2018   Recent Labs    10/31/17 0950  NA 139  K 4.9  CL 107  CO2 24  GLUCOSE 130*  BUN 68*  CREATININE 2.13*  CALCIUM 9.3  GFRNONAA 23*  GFRAA 27*  PROT 6.4*  ALBUMIN 3.3*  AST 35  ALT 63*  ALKPHOS 84  BILITOT 0.7   Iron/TIBC/Ferritin/ %Sat    Component Value Date/Time   IRON 52 12/13/2017 0809   TIBC 271 12/13/2017 0809   FERRITIN 344 (H) 12/13/2017 0809   IRONPCTSAT 19 12/13/2017 0809      ASSESSMENT & PLAN:  1. Anemia in stage 4 chronic kidney disease ([place])   2. Normocytic anemia   3. Fatigue associated with anemia   4. Chronic anticoagulation   5. SOB (shortness of breath)   #We will proceed with Procrit 40,000 units today. #Etiology of shortness of breath is multifactorial.  Possibly secondary to recent discontinuation of Lasix.  Will obtain chest x-ray. Shortness of breath or count also be secondary to anemia. Advised patient to communicate with nephrologist to see if she needs to be back on Lasix, and any fluid restriction.  #  continue following up with primary care physician for INR monitoring.  She needs to have long-term anticoagulation with Coumadin and with INR goal between 2.5-3.5. New generation anticoagulation options are not suitable for her  # continued folic acid 1 mg daily.  All questions were answered. The patient knows to call the clinic with any problems questions or concerns.  Return of visit: 4 weeks, lab, MD and possible  procrit.   Earlie Server, MD, PhD Hematology Oncology [facility] at [facility] Pager- [phone] 01/08/18

## 2018-01-08 ENCOUNTER — Ambulatory Visit
Admission: RE | Admit: 2018-01-08 | Discharge: 2018-01-08 | Disposition: A | Discharge status: Home / Self Care | Payer: Medicare Other | Source: Ambulatory Visit | Attending: Oncology | Admitting: Oncology

## 2018-01-08 ENCOUNTER — Inpatient Hospital Stay: Payer: Medicare Other

## 2018-01-08 ENCOUNTER — Other Ambulatory Visit: Payer: Self-pay

## 2018-01-08 ENCOUNTER — Inpatient Hospital Stay (HOSPITAL_BASED_OUTPATIENT_CLINIC_OR_DEPARTMENT_OTHER): Payer: Medicare Other | Admitting: Oncology

## 2018-01-08 ENCOUNTER — Encounter: Payer: Self-pay | Admitting: Oncology

## 2018-01-08 VITALS — BP 157/73 | HR 55 | Temp 98.7°F | Wt 195.5 lb

## 2018-01-08 DIAGNOSIS — R0602 Shortness of breath: Secondary | ICD-10-CM

## 2018-01-08 DIAGNOSIS — I77810 Thoracic aortic ectasia: Secondary | ICD-10-CM | POA: Diagnosis not present

## 2018-01-08 DIAGNOSIS — D631 Anemia in chronic kidney disease: Secondary | ICD-10-CM | POA: Diagnosis not present

## 2018-01-08 DIAGNOSIS — N184 Chronic kidney disease, stage 4 (severe): Secondary | ICD-10-CM | POA: Diagnosis not present

## 2018-01-08 DIAGNOSIS — I517 Cardiomegaly: Secondary | ICD-10-CM | POA: Insufficient documentation

## 2018-01-08 DIAGNOSIS — Z7901 Long term (current) use of anticoagulants: Secondary | ICD-10-CM

## 2018-01-08 DIAGNOSIS — D649 Anemia, unspecified: Secondary | ICD-10-CM

## 2018-01-08 DIAGNOSIS — N185 Chronic kidney disease, stage 5: Principal | ICD-10-CM

## 2018-01-08 MED ORDER — EPOETIN ALFA 40000 UNIT/ML IJ SOLN
40000.0000 [IU] | Freq: Once | INTRAMUSCULAR | Status: AC
  Administered 2018-01-08: 40000 [IU] via SUBCUTANEOUS
  Filled 2018-01-08: qty 1

## 2018-01-08 NOTE — Progress Notes (Signed)
Patient here today for follow up.   

## 2018-02-04 NOTE — Progress Notes (Deleted)
Hematology/Oncology Follow up note [facility] Telephone:(336) [phone] Fax:(336) [phone]   Patient Care Team: [name], MD as PCP - General (Internal Medicine)  REFERRING PROVIDER: [name], MD CHIEF COMPLAINTS/PURPOSE OF CONSULTATION:  Evaluation for labile INR  HISTORY OF PRESENTING ILLNESS:  Jeanette Romero is a  67 y.o.  female with PMH listed below who was referred to me for evaluation of labile INR. She has extensive cardiac comorbidities including history of aortic mechanic valve replacement, aneurysm of aorta, PAF, Coronary artery disease. She was on Amiodarone and also had drug induced hyperthyroidism.  She was sent to me for evaluation of labile INR.  Extensive review of her medical records from primary care physician's office showed  10/27/2017 INR 5.9,  10/24/2017 INR 4.9 10/12/2017 INR 1.2 10/07/2017 INR 6  10/04/2017 INR 8.1   08/11/2017 INR 7.5  05/24/2017 INR 3.4 04/26/2017 INR 3.5 03/15/2017 INR 3.4 01/31/2017 INR 2.7  08/29/2017 Folate 2.8,  B12 825 and the patient was started on folic acid supplementation.  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 Amiodarone was stopped. And patient was referred to endocrinologist for evaluation. She was found out to have Graves' disease and was started on prednisone. Last TSH that was done in the [facility] system showed a TSH of 4.8.  INTERVAL HISTORY Today patient presents for follow-up for management for her anemia. She has received procrit 20,000 units a few weeks ago. Her hemoglobin has not responded much, and has slightly decreased.  Patient reports that her shortness of breath has been completely resolved since her thyroid function was corrected.  However 2 days ago she started to not feeling  well and yesterday she started to feel more shortness of breath with exertion. She has established care with nephrologist [place].  She was instructed to stop Lasix 40 mg daily as it was not considered to be beneficial.  Her lower extremity edema is considered to be secondary to the anasarca due to proteinuria.  She stopped Lasix about 4-5 days ago. Denies any cough, hemoptysis, chest pain.  She has chronic lower extremity edema.   Review of Systems  Constitutional: Negative for chills, fever and malaise/fatigue.  HENT: Negative for congestion, hearing loss and tinnitus.   Eyes: Negative for blurred vision and double vision.  Respiratory: Positive for shortness of breath. Negative for cough.   Cardiovascular: Positive for leg swelling. Negative for chest pain, palpitations and orthopnea.  Gastrointestinal: Negative for blood in stool and vomiting.  Musculoskeletal: Negative for myalgias.  Skin: Negative for itching and rash.  Neurological: Negative for dizziness.  Endo/Heme/Allergies: Does not bruise/bleed easily.  Psychiatric/Behavioral: The patient is not nervous/anxious.     MEDICAL HISTORY:  Past Medical History:  Diagnosis Date  . Anemia in chronic kidney disease (CKD) 11/14/2017  . Anxiety   . Aortic valve regurgitation   . Chronic kidney disease   . Coronary artery disease   . Dizziness   . Edema   . GERD (gastroesophageal reflux disease)   . Gout   . Hypercholesterolemia   . Hypertension   . Shortness of breath dyspnea     SURGICAL HISTORY: Past Surgical History:  Procedure Laterality Date  . ABDOMINAL HYSTERECTOMY    . AORTIC VALVE REPLACEMENT    . CARDIAC CATHETERIZATION    . CARDIAC CATHETERIZATION N/A 05/07/2015  Procedure: Left Heart Cath;  Surgeon: [name], MD;  Location: [place] [facility];  Service: Cardiovascular;  Laterality: N/A;  . CARDIAC VALVE REPLACEMENT    . ELECTROPHYSIOLOGIC STUDY N/A 05/05/2015   Procedure: Cardioversion;  Surgeon:  [name], MD;  Location: [facility] OR[facility];  Service: Cardiovascular;  Laterality: N/A;  . REPLACEMENT TOTAL KNEE BILATERAL      SOCIAL HISTORY: Social History   Socioeconomic History  . Marital status: Married    Spouse name: Not on file  . Number of children: Not on file  . Years of education: Not on file  . Highest education level: Not on file  Social Needs  . Financial resource strain: Not on file  . Food insecurity - worry: Not on file  . Food insecurity - inability: Not on file  . Transportation needs - medical: Not on file  . Transportation needs - non-medical: Not on file  Occupational History  . Not on file  Tobacco Use  . Smoking status: Former Smoker    Packs/day: 0.50    Years: 20.00    Pack years: 10.00    Types: Cigarettes    Last attempt to quit: 2004    Years since quitting: 15.1  . Smokeless tobacco: Never Used  Substance and Sexual Activity  . Alcohol use: Yes    Alcohol/week: 0.0 oz    Comment: rare  . Drug use: No  . Sexual activity: Not on file  Other Topics Concern  . Not on file  Social History Narrative  . Not on file    FAMILY HISTORY: Family History  Problem Relation Age of Onset  . Hypertension Mother   . Colon cancer Mother   . Prostate cancer Father   . Kidney cancer Brother   . Prostate cancer Brother   . Prostate cancer Brother   . Breast cancer Neg Hx     ALLERGIES:  is allergic to atorvastatin.  MEDICATIONS:  Current Outpatient Medications  Medication Sig Dispense Refill  . ALPRAZolam (XANAX) 0.25 MG tablet Take 0.25 mg by mouth 2 (two) times daily.    Marland Kitchen amLODipine (NORVASC) 10 MG tablet Take 5 mg by mouth daily.     Marland Kitchen aspirin EC 81 MG tablet Take 81 mg by mouth daily.    . benazepril (LOTENSIN) 40 MG tablet Take 40 mg by mouth daily.    . cloNIDine (CATAPRES) 0.3 MG tablet Take 0.3 mg 3 (three) times daily by mouth.  2  . folic acid (FOLVITE) 1 MG tablet Take 1 tablet (1 mg total) by mouth daily. 30 tablet 3  . furosemide  (LASIX) 40 MG tablet Take 40 mg by mouth 2 (two) times daily.     . hydrALAZINE (APRESOLINE) 100 MG tablet Take 100 mg 3 (three) times daily by mouth.    . methimazole (TAPAZOLE) 10 MG tablet Take 20 mg daily by mouth.    . prazosin (MINIPRESS) 1 MG capsule Take 1 mg at bedtime by mouth.    . predniSONE (DELTASONE) 10 MG tablet Take 10 mg by mouth daily.  1  . SODIUM BICARBONATE, ANTACID, PO Take 10 mg by mouth.    . Vitamin D, Ergocalciferol, (DRISDOL) 50000 units CAPS capsule Take 50,000 Units by mouth once a week.    . warfarin (COUMADIN) 2.5 MG tablet Take 3 mg by mouth daily.   0   No current facility-administered medications for this visit.      PHYSICAL EXAMINATION: ECOG PERFORMANCE STATUS: 1 -  Symptomatic but completely ambulatory There were no vitals filed for this visit. There were no vitals filed for this visit.  Physical Exam  Constitutional: She is oriented to person, place, and time and well-developed, well-nourished, and in no distress. No distress.  HENT:  Head: Normocephalic and atraumatic.  Mouth/Throat: Oropharynx is clear and moist. No oropharyngeal exudate.  Eyes: EOM are normal. Pupils are equal, round, and reactive to light. Left eye exhibits no discharge. No scleral icterus.  Neck: Normal range of motion. Neck supple. No JVD present.  Cardiovascular: Normal rate and regular rhythm.  Murmur heard. Pulmonary/Chest: Effort normal. No respiratory distress. She has no wheezes. She exhibits no tenderness.  Right lower lobe crackles.  Abdominal: Soft. Bowel sounds are normal. She exhibits no distension and no mass. There is no tenderness.  Musculoskeletal: Normal range of motion. She exhibits edema. She exhibits no tenderness.  Lymphadenopathy:    She has no cervical adenopathy.  Neurological: She is alert and oriented to person, place, and time. Gait normal.  Skin: Skin is warm and dry. She is not diaphoretic. No erythema.  Psychiatric: Judgment normal.  Flat  affect     LABORATORY DATA:  I have reviewed the data as listed Lab Results  Component Value Date   WBC 7.6 01/05/2018   HGB 8.5 (L) 01/05/2018   HCT 26.5 (L) 01/05/2018   MCV 89.3 01/05/2018   PLT 168 01/05/2018   Recent Labs    10/31/17 0950  NA 139  K 4.9  CL 107  CO2 24  GLUCOSE 130*  BUN 68*  CREATININE 2.13*  CALCIUM 9.3  GFRNONAA 23*  GFRAA 27*  PROT 6.4*  ALBUMIN 3.3*  AST 35  ALT 63*  ALKPHOS 84  BILITOT 0.7   Iron/TIBC/Ferritin/ %Sat    Component Value Date/Time   IRON 52 12/13/2017 0809   TIBC 271 12/13/2017 0809   FERRITIN 344 (H) 12/13/2017 0809   IRONPCTSAT 19 12/13/2017 0809      ASSESSMENT & PLAN:  1. Anemia in stage 4 chronic kidney disease ([place])   2. Normocytic anemia   3. Fatigue associated with anemia   4. Chronic anticoagulation   #We will proceed with Procrit 40,000 units today. #Etiology of shortness of breath is multifactorial.  Possibly secondary to recent discontinuation of Lasix.  Will obtain chest x-ray. Shortness of breath or count also be secondary to anemia. Advised patient to communicate with nephrologist to see if she needs to be back on Lasix, and any fluid restriction.  #  continue following up with primary care physician for INR monitoring.  She needs to have long-term anticoagulation with Coumadin and with INR goal between 2.5-3.5. New generation anticoagulation options are not suitable for her  # continued folic acid 1 mg daily.  All questions were answered. The patient knows to call the clinic with any problems questions or concerns.  Return of visit: 4 weeks, lab, MD and possible  procrit.   Earlie Server, MD, PhD Hematology Oncology [facility] at [facility] Pager- [phone] 02/04/18

## 2018-02-05 ENCOUNTER — Inpatient Hospital Stay: Payer: Medicare Other | Admitting: Oncology

## 2018-02-05 ENCOUNTER — Inpatient Hospital Stay: Payer: Medicare Other

## 2018-02-13 ENCOUNTER — Inpatient Hospital Stay: Payer: Medicare Other | Attending: Oncology

## 2018-02-13 ENCOUNTER — Other Ambulatory Visit: Payer: Self-pay

## 2018-02-13 ENCOUNTER — Inpatient Hospital Stay (HOSPITAL_BASED_OUTPATIENT_CLINIC_OR_DEPARTMENT_OTHER): Payer: Medicare Other | Admitting: Oncology

## 2018-02-13 ENCOUNTER — Inpatient Hospital Stay: Payer: Medicare Other

## 2018-02-13 ENCOUNTER — Encounter: Payer: Self-pay | Admitting: Oncology

## 2018-02-13 VITALS — BP 146/85 | HR 46 | Temp 96.6°F | Wt 196.4 lb

## 2018-02-13 DIAGNOSIS — D649 Anemia, unspecified: Secondary | ICD-10-CM

## 2018-02-13 DIAGNOSIS — D631 Anemia in chronic kidney disease: Secondary | ICD-10-CM | POA: Insufficient documentation

## 2018-02-13 DIAGNOSIS — N184 Chronic kidney disease, stage 4 (severe): Secondary | ICD-10-CM | POA: Diagnosis not present

## 2018-02-13 DIAGNOSIS — N185 Chronic kidney disease, stage 5: Principal | ICD-10-CM

## 2018-02-13 DIAGNOSIS — D529 Folate deficiency anemia, unspecified: Secondary | ICD-10-CM

## 2018-02-13 LAB — CBC
HEMATOCRIT: 26 % — AB (ref 35.0–47.0)
Hemoglobin: 8.3 g/dL — ABNORMAL LOW (ref 12.0–16.0)
MCH: 28.2 pg (ref 26.0–34.0)
MCHC: 31.8 g/dL — ABNORMAL LOW (ref 32.0–36.0)
MCV: 88.9 fL (ref 80.0–100.0)
Platelets: 247 10*3/uL (ref 150–440)
RBC: 2.92 MIL/uL — ABNORMAL LOW (ref 3.80–5.20)
RDW: 18.1 % — AB (ref 11.5–14.5)
WBC: 9.3 10*3/uL (ref 3.6–11.0)

## 2018-02-13 MED ORDER — EPOETIN ALFA 10000 UNIT/ML IJ SOLN
10000.0000 [IU] | Freq: Once | INTRAMUSCULAR | Status: AC
  Administered 2018-02-13: 10000 [IU] via SUBCUTANEOUS
  Filled 2018-02-13: qty 2

## 2018-02-13 MED ORDER — EPOETIN ALFA 20000 UNIT/ML IJ SOLN: 50000.0000 [IU] | Freq: Once | INTRAMUSCULAR | Status: DC

## 2018-02-13 MED ORDER — EPOETIN ALFA 40000 UNIT/ML IJ SOLN
40000.0000 [IU] | Freq: Once | INTRAMUSCULAR | Status: AC
  Administered 2018-02-13: 40000 [IU] via SUBCUTANEOUS
  Filled 2018-02-13: qty 1

## 2018-02-13 NOTE — Progress Notes (Signed)
Hematology/Oncology Follow up note [facility] Telephone:(336) [phone] [phone]   Patient Care Team: [name], MD as PCP - General (Internal Medicine)  REFERRING PROVIDER: [name], MD CHIEF COMPLAINTS/PURPOSE OF CONSULTATION:  Evaluation for labile INR  HISTORY OF PRESENTING ILLNESS:  Jeanette Romero is a  67 y.o.  female with PMH listed below who was referred to me for evaluation of labile INR. She has extensive cardiac comorbidities including history of aortic mechanic valve replacement, aneurysm of aorta, PAF, Coronary artery disease. She was on Amiodarone and also had drug induced hyperthyroidism.  She was sent to me for evaluation of labile INR.  Extensive review of her medical records from primary care physician's office showed  10/27/2017 INR 5.9,  10/24/2017 INR 4.9 10/12/2017 INR 1.2 10/07/2017 INR 6  10/04/2017 INR 8.1   08/11/2017 INR 7.5  05/24/2017 INR 3.4 04/26/2017 INR 3.5 03/15/2017 INR 3.4 01/31/2017 INR 2.7  08/29/2017 Folate 2.8,  B12 825 and the patient was started on folic acid supplementation.  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 Amiodarone was stopped. And patient was referred to endocrinologist for evaluation. She was found out to have Graves' disease and was started on prednisone. Last TSH that was done in the [place] system showed a TSH of 4.8.  INTERVAL HISTORY Today patient presents for follow-up for management for her anemia. She has received procrit 40,000 units a few weeks ago. Her hemoglobin has not responded much, and has slightly decreased.  She reports that overall she feels fatigue level is the same.  She takes Coumadin 4 mg daily with titration according to her INR.  She just had an INR tested this  morning and she does not know her results yet. Denies any chest pain, shortness of breath. Denies any cough, hemoptysis, chest pain.  She has chronic lower extremity edema.   Review of Systems  Constitutional: Negative for chills and fever.  HENT: Negative for congestion, hearing loss and tinnitus.   Eyes: Negative for blurred vision, double vision and pain.  Respiratory: Positive for shortness of breath. Negative for cough and sputum production.   Cardiovascular: Positive for leg swelling. Negative for chest pain, palpitations and orthopnea.  Gastrointestinal: Negative for blood in stool, heartburn, nausea and vomiting.  Genitourinary: Negative for dysuria.  Musculoskeletal: Negative for myalgias.  Skin: Negative for itching and rash.  Neurological: Negative for dizziness and tremors.  Endo/Heme/Allergies: Does not bruise/bleed easily.  Psychiatric/Behavioral: Negative for substance abuse. The patient is not nervous/anxious.     MEDICAL HISTORY:  Past Medical History:  Diagnosis Date  . Anemia in chronic kidney disease (CKD) 11/14/2017  . Anxiety   . Aortic valve regurgitation   . Chronic kidney disease   . Coronary artery disease   . Dizziness   . Edema   . GERD (gastroesophageal reflux disease)   . Gout   . Hypercholesterolemia   . Hypertension   . Shortness of breath dyspnea     SURGICAL HISTORY: Past Surgical History:  Procedure Laterality Date  . ABDOMINAL HYSTERECTOMY    . AORTIC VALVE REPLACEMENT    . CARDIAC CATHETERIZATION    . CARDIAC CATHETERIZATION N/A 05/07/2015   Procedure: Left Heart Cath;  Surgeon: [name], MD;  Location: [place] [facility];  Service: Cardiovascular;  Laterality: N/A;  .  CARDIAC VALVE REPLACEMENT    . ELECTROPHYSIOLOGIC STUDY N/A 05/05/2015   Procedure: Cardioversion;  Surgeon: [name], MD;  Location: [facility] OR[facility];  Service: Cardiovascular;  Laterality: N/A;  . REPLACEMENT TOTAL KNEE BILATERAL      SOCIAL HISTORY: Social  History   Socioeconomic History  . Marital status: Married    Spouse name: Not on file  . Number of children: Not on file  . Years of education: Not on file  . Highest education level: Not on file  Social Needs  . Financial resource strain: Not on file  . Food insecurity - worry: Not on file  . Food insecurity - inability: Not on file  . Transportation needs - medical: Not on file  . Transportation needs - non-medical: Not on file  Occupational History  . Not on file  Tobacco Use  . Smoking status: Former Smoker    Packs/day: 0.50    Years: 20.00    Pack years: 10.00    Types: Cigarettes    Last attempt to quit: 2004    Years since quitting: 15.1  . Smokeless tobacco: Never Used  Substance and Sexual Activity  . Alcohol use: Yes    Alcohol/week: 0.0 oz    Comment: rare  . Drug use: No  . Sexual activity: Not on file  Other Topics Concern  . Not on file  Social History Narrative  . Not on file    FAMILY HISTORY: Family History  Problem Relation Age of Onset  . Hypertension Mother   . Colon cancer Mother   . Prostate cancer Father   . Kidney cancer Brother   . Prostate cancer Brother   . Prostate cancer Brother   . Breast cancer Neg Hx     ALLERGIES:  is allergic to atorvastatin.  MEDICATIONS:  Current Outpatient Medications  Medication Sig Dispense Refill  . ALPRAZolam (XANAX) 0.25 MG tablet Take 0.25 mg by mouth 2 (two) times daily.    Marland Kitchen amLODipine (NORVASC) 10 MG tablet Take 5 mg by mouth daily.     Marland Kitchen aspirin EC 81 MG tablet Take 81 mg by mouth daily.    . benazepril (LOTENSIN) 40 MG tablet Take 40 mg by mouth daily.    . cloNIDine (CATAPRES) 0.3 MG tablet Take 0.3 mg 3 (three) times daily by mouth.  2  . folic acid (FOLVITE) 1 MG tablet Take 1 tablet (1 mg total) by mouth daily. 30 tablet 3  . furosemide (LASIX) 40 MG tablet Take 40 mg by mouth 2 (two) times daily.     . hydrALAZINE (APRESOLINE) 100 MG tablet Take 100 mg 3 (three) times daily by mouth.     . methylPREDNISolone (MEDROL DOSEPAK) 4 MG TBPK tablet Take 4 mg by mouth.    . prazosin (MINIPRESS) 1 MG capsule Take 1 mg at bedtime by mouth.    . predniSONE (DELTASONE) 10 MG tablet Take 10 mg by mouth daily.  1  . SODIUM BICARBONATE, ANTACID, PO Take 10 mg by mouth.    . Vitamin D, Ergocalciferol, (DRISDOL) 50000 units CAPS capsule Take 50,000 Units by mouth once a week.    . warfarin (COUMADIN) 2 MG tablet Take 4 mg by mouth daily.    . methimazole (TAPAZOLE) 10 MG tablet Take 20 mg daily by mouth.     No current facility-administered medications for this visit.      PHYSICAL EXAMINATION: ECOG PERFORMANCE STATUS: 1 - Symptomatic but completely ambulatory Vitals:   02/13/18 1100  BP: (!) 146/85  Pulse: (!) 46  Temp: (!) 96.6 F (35.9 C)   Filed Weights   02/13/18 1100  Weight: 196 lb 7 oz (89.1 kg)    Physical Exam  Constitutional: She is oriented to person, place, and time and well-developed, well-nourished, and in no distress. No distress.  HENT:  Head: Normocephalic and atraumatic.  Mouth/Throat: Oropharynx is clear and moist. No oropharyngeal exudate.  Eyes: EOM are normal. Pupils are equal, round, and reactive to light. Left eye exhibits no discharge. No scleral icterus.  Neck: Normal range of motion. Neck supple. No JVD present.  Cardiovascular: Normal rate and regular rhythm.  Murmur heard. Pulmonary/Chest: Effort normal. No respiratory distress. She has no wheezes. She exhibits no tenderness.  Right lower lobe crackles.  Abdominal: Soft. Bowel sounds are normal. She exhibits no distension and no mass. There is no tenderness.  Musculoskeletal: Normal range of motion. She exhibits edema. She exhibits no tenderness.  Lymphadenopathy:    She has no cervical adenopathy.  Neurological: She is alert and oriented to person, place, and time. No cranial nerve deficit. Gait normal.  Skin: Skin is warm and dry. She is not diaphoretic. No erythema.  Psychiatric: Judgment  normal.  Flat affect     LABORATORY DATA:  I have reviewed the data as listed Lab Results  Component Value Date   WBC 9.3 02/13/2018   HGB 8.3 (L) 02/13/2018   HCT 26.0 (L) 02/13/2018   MCV 88.9 02/13/2018   PLT 247 02/13/2018   Recent Labs    10/31/17 0950  NA 139  K 4.9  CL 107  CO2 24  GLUCOSE 130*  BUN 68*  CREATININE 2.13*  CALCIUM 9.3  GFRNONAA 23*  GFRAA 27*  PROT 6.4*  ALBUMIN 3.3*  AST 35  ALT 63*  ALKPHOS 84  BILITOT 0.7   Iron/TIBC/Ferritin/ %Sat    Component Value Date/Time   IRON 52 12/13/2017 0809   TIBC 271 12/13/2017 0809   FERRITIN 344 (H) 12/13/2017 0809   IRONPCTSAT 19 12/13/2017 0809    08/29/2017 Folate 2.8,  B12 825               01/02/2018 TSH 3.05  ASSESSMENT & PLAN:  1. Normocytic anemia   2. Fatigue associated with anemia   3. Anemia due to folic acid deficiency, unspecified deficiency type     #Patient's hemoglobin has not improved after Procrit 40,000 units, will increase to 25% and proceed with Procrit 50,000 units today.  #  continue following up with primary care physician for INR monitoring.  She needs to have long-term anticoagulation with Coumadin and with INR goal between 2.5-3.5. New generation anticoagulation options are not suitable for her  # continued folic acid 1 mg daily. Will recheck folate level at next visit.   # Patient appears to be always in a depressed mood. After being told that her hemoglobin has not respond to the  treatment, she says" so I have just waste my time coming here and getting treatments! ". Explain to patient that her procrit dose will be adjusted today and hopefully be more effective. And there is always an adjusting/titrating process. She appears not interested to discuss more.   Check cbc, folate, vitamin B12, iron and ferritin All questions were answered. The patient knows to call the clinic with any problems questions or concerns.  Return of visit: 4 weeks, lab, MD and possible  procrit.   Earlie Server, MD, PhD Hematology Oncology [facility] at [facility] Pager- [phone] 02/13/18

## 2018-03-07 ENCOUNTER — Other Ambulatory Visit: Payer: Self-pay | Admitting: Oncology

## 2018-03-08 ENCOUNTER — Other Ambulatory Visit: Payer: Self-pay | Admitting: Oncology

## 2018-03-13 ENCOUNTER — Inpatient Hospital Stay: Payer: Medicare Other

## 2018-03-13 ENCOUNTER — Inpatient Hospital Stay: Payer: Medicare Other | Attending: Oncology

## 2018-03-13 ENCOUNTER — Inpatient Hospital Stay (HOSPITAL_BASED_OUTPATIENT_CLINIC_OR_DEPARTMENT_OTHER): Payer: Medicare Other | Admitting: Oncology

## 2018-03-13 ENCOUNTER — Encounter: Payer: Self-pay | Admitting: Oncology

## 2018-03-13 VITALS — BP 187/84 | HR 57 | Temp 97.9°F | Resp 18 | Wt 197.0 lb

## 2018-03-13 DIAGNOSIS — I129 Hypertensive chronic kidney disease with stage 1 through stage 4 chronic kidney disease, or unspecified chronic kidney disease: Secondary | ICD-10-CM

## 2018-03-13 DIAGNOSIS — N184 Chronic kidney disease, stage 4 (severe): Secondary | ICD-10-CM

## 2018-03-13 DIAGNOSIS — D529 Folate deficiency anemia, unspecified: Secondary | ICD-10-CM

## 2018-03-13 DIAGNOSIS — D631 Anemia in chronic kidney disease: Secondary | ICD-10-CM | POA: Diagnosis not present

## 2018-03-13 DIAGNOSIS — Z7901 Long term (current) use of anticoagulants: Secondary | ICD-10-CM | POA: Diagnosis not present

## 2018-03-13 DIAGNOSIS — D649 Anemia, unspecified: Secondary | ICD-10-CM

## 2018-03-13 LAB — COMPREHENSIVE METABOLIC PANEL
ALT: 9 U/L — ABNORMAL LOW (ref 14–54)
AST: 14 U/L — AB (ref 15–41)
Albumin: 3.2 g/dL — ABNORMAL LOW (ref 3.5–5.0)
Alkaline Phosphatase: 93 U/L (ref 38–126)
Anion gap: 10 (ref 5–15)
BUN: 18 mg/dL (ref 6–20)
CHLORIDE: 108 mmol/L (ref 101–111)
CO2: 23 mmol/L (ref 22–32)
Calcium: 9.2 mg/dL (ref 8.9–10.3)
Creatinine, Ser: 2.18 mg/dL — ABNORMAL HIGH (ref 0.44–1.00)
GFR calc Af Amer: 26 mL/min — ABNORMAL LOW (ref >60)
GFR, EST NON AFRICAN AMERICAN: 22 mL/min — AB (ref >60)
Glucose, Bld: 102 mg/dL — ABNORMAL HIGH (ref 65–99)
POTASSIUM: 3.4 mmol/L — AB (ref 3.5–5.1)
SODIUM: 141 mmol/L (ref 135–145)
Total Bilirubin: 0.6 mg/dL (ref 0.3–1.2)
Total Protein: 6.5 g/dL (ref 6.5–8.1)

## 2018-03-13 LAB — CBC
HEMATOCRIT: 25.3 % — AB (ref 35.0–47.0)
Hemoglobin: 8.1 g/dL — ABNORMAL LOW (ref 12.0–16.0)
MCH: 27.7 pg (ref 26.0–34.0)
MCHC: 31.9 g/dL — ABNORMAL LOW (ref 32.0–36.0)
MCV: 86.8 fL (ref 80.0–100.0)
Platelets: 248 10*3/uL (ref 150–440)
RBC: 2.92 MIL/uL — AB (ref 3.80–5.20)
RDW: 18.1 % — AB (ref 11.5–14.5)
WBC: 5 10*3/uL (ref 3.6–11.0)

## 2018-03-13 LAB — FERRITIN: Ferritin: 150 ng/mL (ref 11–307)

## 2018-03-13 LAB — IRON AND TIBC
Iron: 27 ug/dL — ABNORMAL LOW (ref 28–170)
SATURATION RATIOS: 11 % (ref 10.4–31.8)
TIBC: 248 ug/dL — ABNORMAL LOW (ref 250–450)
UIBC: 221 ug/dL

## 2018-03-13 LAB — VITAMIN B12: VITAMIN B 12: 496 pg/mL (ref 180–914)

## 2018-03-13 LAB — FOLATE: FOLATE: 87 ng/mL (ref >5.9)

## 2018-03-13 NOTE — Progress Notes (Signed)
Hematology/Oncology Follow up note [facility] Telephone:(336) [phone] Fax:(336) [phone]   Patient Care Team: [name], MD as PCP - General (Internal Medicine)  REFERRING PROVIDER: [name], MD CHIEF COMPLAINTS/PURPOSE OF CONSULTATION:  Evaluation for labile INR  HISTORY OF PRESENTING ILLNESS:  Jeanette Romero is a  67 y.o.  female with PMH listed below who was referred to me for evaluation of labile INR. She has extensive cardiac comorbidities including history of aortic mechanic valve replacement, aneurysm of aorta, PAF, Coronary artery disease. She was on Amiodarone and also had drug induced hyperthyroidism.  She was sent to me for evaluation of labile INR.  Extensive review of her medical records from primary care physician's office showed  10/27/2017 INR 5.9,  10/24/2017 INR 4.9 10/12/2017 INR 1.2 10/07/2017 INR 6  10/04/2017 INR 8.1   08/11/2017 INR 7.5  05/24/2017 INR 3.4 04/26/2017 INR 3.5 03/15/2017 INR 3.4 01/31/2017 INR 2.7  08/29/2017 Folate 2.8,  B12 825 and the patient was started on folic acid supplementation.  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 Amiodarone was stopped. And patient was referred to endocrinologist for evaluation. She was found out to have Graves' disease and was started on prednisone. Last TSH that was done in the [place] system showed a TSH of 4.8.  INTERVAL HISTORY Today patient presents for follow-up for management for her anemia. She has received procrit 50,000 units 4 weeks ago.  Her hemoglobin has not responded much, and has slightly decreased.  Reports feeling fatigue and congested today.  Denies any fever or chills.  Blood pressure has been elevated despite patient reports being compliant with her medication.   Denies any double vision or headache..  She takes Coumadin daily with titration according to her INR.  Denies any cough, hemoptysis, chest pain.  Denies seeing any blood in the stool or having black tarry stool.  Reports that she has had a colonoscopy 3 years ago with [facility] (no records in [place]), and reports having normal results.  Review of Systems  Constitutional: Negative for chills and fever.  HENT: Negative for congestion, hearing loss and tinnitus.        Nasal congestion.   Eyes: Negative for blurred vision, double vision and pain.  Respiratory: Positive for shortness of breath. Negative for cough and sputum production.   Cardiovascular: Negative for chest pain, palpitations, orthopnea and leg swelling.  Gastrointestinal: Negative for blood in stool, heartburn, nausea and vomiting.  Genitourinary: Negative for dysuria.  Musculoskeletal: Negative for myalgias.  Skin: Negative for itching and rash.  Neurological: Negative for dizziness, tremors and speech change.  Endo/Heme/Allergies: Does not bruise/bleed easily.  Psychiatric/Behavioral: Negative for substance abuse. The patient is not nervous/anxious.     MEDICAL HISTORY:  Past Medical History:  Diagnosis Date  . Anemia in chronic kidney disease (CKD) 11/14/2017  . Anxiety   . Aortic valve regurgitation   . Chronic kidney disease   . Coronary artery disease   . Dizziness   . Edema   . GERD (gastroesophageal reflux disease)   . Gout   . Hypercholesterolemia   . Hypertension   . Shortness of breath dyspnea     SURGICAL HISTORY: Past Surgical History:  Procedure Laterality Date  . ABDOMINAL HYSTERECTOMY    . AORTIC VALVE REPLACEMENT    . CARDIAC  CATHETERIZATION    . CARDIAC CATHETERIZATION N/A 05/07/2015   Procedure: Left Heart Cath;  Surgeon: [name], MD;  Location: [place] [facility];  Service: Cardiovascular;  Laterality: N/A;  . CARDIAC VALVE REPLACEMENT    . ELECTROPHYSIOLOGIC STUDY  N/A 05/05/2015   Procedure: Cardioversion;  Surgeon: [name], MD;  Location: [facility] ORS;  Service: Cardiovascular;  Laterality: N/A;  . REPLACEMENT TOTAL KNEE BILATERAL      SOCIAL HISTORY: Social History   Socioeconomic History  . Marital status: Married    Spouse name: Not on file  . Number of children: Not on file  . Years of education: Not on file  . Highest education level: Not on file  Occupational History  . Not on file  Social Needs  . Financial resource strain: Not on file  . Food insecurity:    Worry: Not on file    Inability: Not on file  . Transportation needs:    Medical: Not on file    Non-medical: Not on file  Tobacco Use  . Smoking status: Former Smoker    Packs/day: 0.50    Years: 20.00    Pack years: 10.00    Types: Cigarettes    Last attempt to quit: 2004    Years since quitting: 15.2  . Smokeless tobacco: Never Used  Substance and Sexual Activity  . Alcohol use: Yes    Alcohol/week: 0.0 oz    Comment: rare  . Drug use: No  . Sexual activity: Not on file  Lifestyle  . Physical activity:    Days per week: Not on file    Minutes per session: Not on file  . Stress: Not on file  Relationships  . Social connections:    Talks on phone: Not on file    Gets together: Not on file    Attends religious service: Not on file    Active member of club or organization: Not on file    Attends meetings of clubs or organizations: Not on file    Relationship status: Not on file  . Intimate partner violence:    Fear of current or ex partner: Not on file    Emotionally abused: Not on file    Physically abused: Not on file    Forced sexual activity: Not on file  Other Topics Concern  . Not on file  Social History Narrative  . Not on file    FAMILY HISTORY: Family History  Problem Relation Age of Onset  . Hypertension Mother   . Colon cancer Mother   . Prostate cancer Father   . Kidney cancer Brother   . Prostate cancer Brother   . Prostate cancer  Brother   . Breast cancer Neg Hx     ALLERGIES:  is allergic to atorvastatin.  MEDICATIONS:  Current Outpatient Medications  Medication Sig Dispense Refill  . ALPRAZolam (XANAX) 0.25 MG tablet Take 0.25 mg by mouth 2 (two) times daily.    Marland Kitchen amLODipine (NORVASC) 10 MG tablet Take 5 mg by mouth daily.     Marland Kitchen aspirin EC 81 MG tablet Take 81 mg by mouth daily.    . benazepril (LOTENSIN) 40 MG tablet Take 40 mg by mouth daily.    . cloNIDine (CATAPRES) 0.3 MG tablet Take 0.3 mg 3 (three) times daily by mouth.  2  . Dextromethorphan-guaiFENesin (CORICIDIN HBP CONGESTION/COUGH PO) Take by mouth.    . folic acid (FOLVITE) 1 MG tablet TAKE 1 TABLET(1 MG) BY MOUTH  DAILY 30 tablet 2  . folic acid (FOLVITE) 1 MG tablet TAKE 1 TABLET(1 MG) BY MOUTH DAILY 30 tablet 0  . furosemide (LASIX) 40 MG tablet Take 40 mg by mouth 2 (two) times daily.     . hydrALAZINE (APRESOLINE) 100 MG tablet Take 100 mg 3 (three) times daily by mouth.    . methylPREDNISolone (MEDROL DOSEPAK) 4 MG TBPK tablet Take 4 mg by mouth.    . prazosin (MINIPRESS) 1 MG capsule Take 1 mg at bedtime by mouth.    . predniSONE (DELTASONE) 10 MG tablet Take 10 mg by mouth daily.  1  . SODIUM BICARBONATE, ANTACID, PO Take 10 mg by mouth.    . warfarin (COUMADIN) 2 MG tablet Take 4 mg by mouth daily.    . methimazole (TAPAZOLE) 10 MG tablet Take 20 mg daily by mouth.     No current facility-administered medications for this visit.      PHYSICAL EXAMINATION: ECOG PERFORMANCE STATUS: 1 - Symptomatic but completely ambulatory Vitals:   03/13/18 1055 03/13/18 1102  BP: (!) 187/84   Pulse:  (!) 57  Resp: 18   Temp: 97.9 F (36.6 C)    Filed Weights   03/13/18 1055  Weight: 197 lb (89.4 kg)    Physical Exam  Constitutional: She is oriented to person, place, and time and well-developed, well-nourished, and in no distress. No distress.  HENT:  Head: Normocephalic and atraumatic.  Mouth/Throat: Oropharynx is clear and moist. No  oropharyngeal exudate.  Eyes: Pupils are equal, round, and reactive to light. EOM are normal. Left eye exhibits no discharge. No scleral icterus.  pallor  Neck: Normal range of motion. Neck supple. No JVD present.  Cardiovascular: Normal rate and regular rhythm.  Murmur heard. Pulmonary/Chest: Effort normal. No respiratory distress. She has no wheezes. She exhibits no tenderness.  Right lower lobe crackles.  Abdominal: Soft. Bowel sounds are normal. She exhibits no distension and no mass. There is no tenderness.  Musculoskeletal: Normal range of motion. She exhibits no edema or tenderness.  Lymphadenopathy:    She has no cervical adenopathy.  Neurological: She is alert and oriented to person, place, and time. No cranial nerve deficit. Gait normal.  Skin: Skin is warm and dry. She is not diaphoretic. No erythema.  Psychiatric: Affect and judgment normal.  Flat affect     LABORATORY DATA:  I have reviewed the data as listed Lab Results  Component Value Date   WBC 5.0 03/13/2018   HGB 8.1 (L) 03/13/2018   HCT 25.3 (L) 03/13/2018   MCV 86.8 03/13/2018   PLT 248 03/13/2018   Recent Labs    10/31/17 0950 03/13/18 1040  NA 139 141  K 4.9 3.4*  CL 107 108  CO2 24 23  GLUCOSE 130* 102*  BUN 68* 18  CREATININE 2.13* 2.18*  CALCIUM 9.3 9.2  GFRNONAA 23* 22*  GFRAA 27* 26*  PROT 6.4* 6.5  ALBUMIN 3.3* 3.2*  AST 35 14*  ALT 63* 9*  ALKPHOS 84 93  BILITOT 0.7 0.6   Iron/TIBC/Ferritin/ %Sat    Component Value Date/Time   IRON 27 (L) 03/13/2018 1040   TIBC 248 (L) 03/13/2018 1040   FERRITIN 150 03/13/2018 1040   IRONPCTSAT 11 03/13/2018 1040    08/29/2017 Folate 2.8,  B12 825               01/02/2018 TSH 3.05  ASSESSMENT & PLAN:  1. Anemia in stage 4 chronic kidney disease (HCC)     #Patient's hemoglobin has not improved after Procrit 50,000 units, plan increase to 25% and proceed with Procrit 60,000 units.  #Today patient's blood pressure is not well controlled.  He is compliant with patient that she can come another day and have blood pressure measured and if it is acceptable we will proceed to give her Procrit shot 60,000 units.  Holding parameters will retain the Procrit order  #  continue following up with primary care physician for INR monitoring.  She needs to have long-term anticoagulation with Coumadin and with INR goal between 2.5-3.5. New generation anticoagulation options are not suitable for her  # continued folic acid 1 mg daily.  She has normal folate acid level. #Patient's ferritin improved after IV iron however gradually decrease, currently at 150.  Obtain occult stool card x3.  Need to rule out chronic blood loss from GI tract.  Recommend patient to follow-up with gastroenterologist for further evaluation.  All questions were answered. The patient knows to call the clinic with any problems questions or concerns.  Return of visit: 4 weeks,  Earlie Server, MD, PhD Hematology Oncology [facility] at [facility] Pager- [phone] 03/13/18

## 2018-03-23 ENCOUNTER — Inpatient Hospital Stay: Payer: Medicare Other

## 2018-03-23 VITALS — BP 155/76

## 2018-03-23 DIAGNOSIS — D631 Anemia in chronic kidney disease: Secondary | ICD-10-CM

## 2018-03-23 DIAGNOSIS — N185 Chronic kidney disease, stage 5: Principal | ICD-10-CM

## 2018-03-23 DIAGNOSIS — N184 Chronic kidney disease, stage 4 (severe): Secondary | ICD-10-CM | POA: Diagnosis not present

## 2018-03-23 MED ORDER — EPOETIN ALFA 40000 UNIT/ML IJ SOLN
40000.0000 [IU] | Freq: Once | INTRAMUSCULAR | Status: AC
  Administered 2018-03-23: 40000 [IU] via SUBCUTANEOUS

## 2018-03-23 MED ORDER — EPOETIN ALFA 20000 UNIT/ML IJ SOLN
20000.0000 [IU] | Freq: Once | INTRAMUSCULAR | Status: AC
  Administered 2018-03-23: 20000 [IU] via SUBCUTANEOUS

## 2018-03-23 MED ORDER — EPOETIN ALFA 20000 UNIT/ML IJ SOLN: 60000.0000 [IU] | Freq: Once | INTRAMUSCULAR | Status: DC

## 2018-03-23 NOTE — Progress Notes (Unsigned)
Patient here for procrit injection Checked bp 166/78 rechecked 155/76

## 2018-03-26 ENCOUNTER — Other Ambulatory Visit: Payer: Self-pay | Admitting: Oncology

## 2018-04-01 ENCOUNTER — Emergency Department
Admission: EM | Admit: 2018-04-01 | Discharge: 2018-04-01 | Disposition: A | Discharge status: Other Acute Inpatient Hospital | Payer: Medicare Other | Attending: Emergency Medicine | Admitting: Emergency Medicine

## 2018-04-01 DIAGNOSIS — T2220XA Burn of second degree of shoulder and upper limb, except wrist and hand, unspecified site, initial encounter: Secondary | ICD-10-CM

## 2018-04-01 DIAGNOSIS — Y929 Unspecified place or not applicable: Secondary | ICD-10-CM | POA: Diagnosis not present

## 2018-04-01 DIAGNOSIS — Y999 Unspecified external cause status: Secondary | ICD-10-CM | POA: Diagnosis not present

## 2018-04-01 DIAGNOSIS — I2510 Atherosclerotic heart disease of native coronary artery without angina pectoris: Secondary | ICD-10-CM | POA: Diagnosis not present

## 2018-04-01 DIAGNOSIS — Z7982 Long term (current) use of aspirin: Secondary | ICD-10-CM | POA: Insufficient documentation

## 2018-04-01 DIAGNOSIS — T22031A Burn of unspecified degree of right upper arm, initial encounter: Secondary | ICD-10-CM | POA: Diagnosis present

## 2018-04-01 DIAGNOSIS — I129 Hypertensive chronic kidney disease with stage 1 through stage 4 chronic kidney disease, or unspecified chronic kidney disease: Secondary | ICD-10-CM | POA: Insufficient documentation

## 2018-04-01 DIAGNOSIS — T22291A Burn of second degree of multiple sites of right shoulder and upper limb, except wrist and hand, initial encounter: Secondary | ICD-10-CM | POA: Insufficient documentation

## 2018-04-01 DIAGNOSIS — Z79899 Other long term (current) drug therapy: Secondary | ICD-10-CM | POA: Diagnosis not present

## 2018-04-01 DIAGNOSIS — Z23 Encounter for immunization: Secondary | ICD-10-CM | POA: Diagnosis not present

## 2018-04-01 DIAGNOSIS — Z7901 Long term (current) use of anticoagulants: Secondary | ICD-10-CM | POA: Diagnosis not present

## 2018-04-01 DIAGNOSIS — X102XXA Contact with fats and cooking oils, initial encounter: Secondary | ICD-10-CM | POA: Insufficient documentation

## 2018-04-01 DIAGNOSIS — N189 Chronic kidney disease, unspecified: Secondary | ICD-10-CM | POA: Insufficient documentation

## 2018-04-01 DIAGNOSIS — Y93G3 Activity, cooking and baking: Secondary | ICD-10-CM | POA: Diagnosis not present

## 2018-04-01 LAB — CBC WITH DIFFERENTIAL/PLATELET
Basophils Absolute: 0.1 10*3/uL (ref 0–0.1)
Basophils Relative: 1 %
Eosinophils Absolute: 0 10*3/uL (ref 0–0.7)
Eosinophils Relative: 0 %
HCT: 26.5 % — ABNORMAL LOW (ref 35.0–47.0)
Hemoglobin: 8.5 g/dL — ABNORMAL LOW (ref 12.0–16.0)
Lymphocytes Relative: 9 %
Lymphs Abs: 0.6 10*3/uL — ABNORMAL LOW (ref 1.0–3.6)
MCH: 27.4 pg (ref 26.0–34.0)
MCHC: 32.2 g/dL (ref 32.0–36.0)
MCV: 85.2 fL (ref 80.0–100.0)
Monocytes Absolute: 0.5 10*3/uL (ref 0.2–0.9)
Monocytes Relative: 8 %
Neutro Abs: 5.1 10*3/uL (ref 1.4–6.5)
Neutrophils Relative %: 82 %
Platelets: 261 10*3/uL (ref 150–440)
RBC: 3.11 MIL/uL — ABNORMAL LOW (ref 3.80–5.20)
RDW: 18.5 % — ABNORMAL HIGH (ref 11.5–14.5)
WBC: 6.2 10*3/uL (ref 3.6–11.0)

## 2018-04-01 LAB — PROTIME-INR
INR: 1.7
Prothrombin Time: 19.8 seconds — ABNORMAL HIGH (ref 11.4–15.2)

## 2018-04-01 LAB — BASIC METABOLIC PANEL
Anion gap: 9 (ref 5–15)
BUN: 21 mg/dL — ABNORMAL HIGH (ref 6–20)
CALCIUM: 9.6 mg/dL (ref 8.9–10.3)
CHLORIDE: 105 mmol/L (ref 101–111)
CO2: 26 mmol/L (ref 22–32)
CREATININE: 1.94 mg/dL — AB (ref 0.44–1.00)
GFR calc Af Amer: 30 mL/min — ABNORMAL LOW (ref >60)
GFR calc non Af Amer: 26 mL/min — ABNORMAL LOW (ref >60)
GLUCOSE: 99 mg/dL (ref 65–99)
Potassium: 3 mmol/L — ABNORMAL LOW (ref 3.5–5.1)
Sodium: 140 mmol/L (ref 135–145)

## 2018-04-01 MED ORDER — SILVER SULFADIAZINE 1 % EX CREA: TOPICAL_CREAM | Freq: Once | CUTANEOUS | Status: DC

## 2018-04-01 MED ORDER — OXYCODONE-ACETAMINOPHEN 5-325 MG PO TABS: 1.0000 | ORAL_TABLET | Freq: Once | ORAL | Status: DC

## 2018-04-01 MED ORDER — TETANUS-DIPHTH-ACELL PERTUSSIS 5-2.5-18.5 LF-MCG/0.5 IM SUSP
0.5000 mL | Freq: Once | INTRAMUSCULAR | Status: AC
  Administered 2018-04-01: 0.5 mL via INTRAMUSCULAR
  Filled 2018-04-01: qty 0.5

## 2018-04-01 NOTE — Discharge Instructions (Addendum)
Go directly to [address] [facility] neuroscience building, [place].

## 2018-04-01 NOTE — ED Notes (Signed)
emtala reviewed. Ok to transfer.

## 2018-04-01 NOTE — ED Notes (Signed)
Dry dressing applied by this RN to pt's chest and R arm.

## 2018-04-01 NOTE — ED Notes (Signed)
Report to megan, rn.  
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
## 2018-04-20 ENCOUNTER — Other Ambulatory Visit: Payer: Self-pay

## 2018-04-20 ENCOUNTER — Encounter: Payer: Self-pay | Admitting: Oncology

## 2018-04-20 ENCOUNTER — Inpatient Hospital Stay (HOSPITAL_BASED_OUTPATIENT_CLINIC_OR_DEPARTMENT_OTHER): Payer: Medicare Other | Admitting: Oncology

## 2018-04-20 ENCOUNTER — Inpatient Hospital Stay: Payer: Medicare Other

## 2018-04-20 ENCOUNTER — Inpatient Hospital Stay: Payer: Medicare Other | Attending: Oncology

## 2018-04-20 VITALS — BP 159/82 | HR 54 | Temp 96.1°F | Wt 191.5 lb

## 2018-04-20 DIAGNOSIS — D631 Anemia in chronic kidney disease: Secondary | ICD-10-CM | POA: Diagnosis not present

## 2018-04-20 DIAGNOSIS — N184 Chronic kidney disease, stage 4 (severe): Secondary | ICD-10-CM

## 2018-04-20 DIAGNOSIS — R5383 Other fatigue: Secondary | ICD-10-CM | POA: Diagnosis not present

## 2018-04-20 DIAGNOSIS — N185 Chronic kidney disease, stage 5: Principal | ICD-10-CM

## 2018-04-20 DIAGNOSIS — Z7901 Long term (current) use of anticoagulants: Secondary | ICD-10-CM

## 2018-04-20 DIAGNOSIS — D649 Anemia, unspecified: Secondary | ICD-10-CM

## 2018-04-20 LAB — CBC
HEMATOCRIT: 29.6 % — AB (ref 35.0–47.0)
HEMOGLOBIN: 9.7 g/dL — AB (ref 12.0–16.0)
MCH: 29 pg (ref 26.0–34.0)
MCHC: 32.7 g/dL (ref 32.0–36.0)
MCV: 88.8 fL (ref 80.0–100.0)
Platelets: 259 10*3/uL (ref 150–440)
RBC: 3.33 MIL/uL — ABNORMAL LOW (ref 3.80–5.20)
RDW: 18.1 % — ABNORMAL HIGH (ref 11.5–14.5)
WBC: 6.6 10*3/uL (ref 3.6–11.0)

## 2018-04-20 MED ORDER — EPOETIN ALFA 40000 UNIT/ML IJ SOLN
40000.0000 [IU] | Freq: Once | INTRAMUSCULAR | Status: AC
  Administered 2018-04-20: 40000 [IU] via SUBCUTANEOUS
  Filled 2018-04-20: qty 1

## 2018-04-20 MED ORDER — EPOETIN ALFA 20000 UNIT/ML IJ SOLN: 60000.0000 [IU] | Freq: Once | INTRAMUSCULAR | Status: DC

## 2018-04-20 MED ORDER — EPOETIN ALFA 20000 UNIT/ML IJ SOLN
20000.0000 [IU] | Freq: Once | INTRAMUSCULAR | Status: AC
  Administered 2018-04-20: 20000 [IU] via SUBCUTANEOUS
  Filled 2018-04-20: qty 1
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
## 2018-04-24 ENCOUNTER — Other Ambulatory Visit: Payer: Self-pay | Admitting: Oncology

## 2018-05-18 ENCOUNTER — Ambulatory Visit: Payer: Medicare Other | Admitting: Oncology

## 2018-05-18 ENCOUNTER — Ambulatory Visit: Payer: Medicare Other

## 2018-05-18 ENCOUNTER — Other Ambulatory Visit: Payer: Medicare Other

## 2018-05-21 ENCOUNTER — Inpatient Hospital Stay: Payer: Medicare Other

## 2018-05-21 ENCOUNTER — Other Ambulatory Visit: Payer: Self-pay

## 2018-05-21 ENCOUNTER — Inpatient Hospital Stay: Payer: Medicare Other | Attending: Oncology | Admitting: Oncology

## 2018-05-21 ENCOUNTER — Encounter: Payer: Self-pay | Admitting: Oncology

## 2018-05-21 VITALS — BP 150/73 | HR 59 | Temp 96.6°F | Resp 18 | Wt 181.4 lb

## 2018-05-21 DIAGNOSIS — E538 Deficiency of other specified B group vitamins: Secondary | ICD-10-CM | POA: Insufficient documentation

## 2018-05-21 DIAGNOSIS — D509 Iron deficiency anemia, unspecified: Secondary | ICD-10-CM

## 2018-05-21 DIAGNOSIS — D631 Anemia in chronic kidney disease: Secondary | ICD-10-CM

## 2018-05-21 DIAGNOSIS — N185 Chronic kidney disease, stage 5: Principal | ICD-10-CM

## 2018-05-21 DIAGNOSIS — R5382 Chronic fatigue, unspecified: Secondary | ICD-10-CM | POA: Insufficient documentation

## 2018-05-21 DIAGNOSIS — D649 Anemia, unspecified: Secondary | ICD-10-CM

## 2018-05-21 DIAGNOSIS — Z7901 Long term (current) use of anticoagulants: Secondary | ICD-10-CM

## 2018-05-21 DIAGNOSIS — Z87891 Personal history of nicotine dependence: Secondary | ICD-10-CM

## 2018-05-21 DIAGNOSIS — E638 Other specified nutritional deficiencies: Secondary | ICD-10-CM

## 2018-05-21 DIAGNOSIS — F329 Major depressive disorder, single episode, unspecified: Secondary | ICD-10-CM | POA: Diagnosis not present

## 2018-05-21 DIAGNOSIS — R5383 Other fatigue: Secondary | ICD-10-CM

## 2018-05-21 DIAGNOSIS — F32A Depression, unspecified: Secondary | ICD-10-CM

## 2018-05-21 DIAGNOSIS — E611 Iron deficiency: Secondary | ICD-10-CM | POA: Insufficient documentation

## 2018-05-21 DIAGNOSIS — N184 Chronic kidney disease, stage 4 (severe): Secondary | ICD-10-CM

## 2018-05-21 LAB — CBC
HEMATOCRIT: 27.7 % — AB (ref 35.0–47.0)
Hemoglobin: 9.1 g/dL — ABNORMAL LOW (ref 12.0–16.0)
MCH: 28.2 pg (ref 26.0–34.0)
MCHC: 32.8 g/dL (ref 32.0–36.0)
MCV: 86.2 fL (ref 80.0–100.0)
Platelets: 249 10*3/uL (ref 150–440)
RBC: 3.21 MIL/uL — ABNORMAL LOW (ref 3.80–5.20)
RDW: 16.7 % — ABNORMAL HIGH (ref 11.5–14.5)
WBC: 6.4 10*3/uL (ref 3.6–11.0)

## 2018-05-21 MED ORDER — EPOETIN ALFA 40000 UNIT/ML IJ SOLN
40000.0000 [IU] | Freq: Once | INTRAMUSCULAR | Status: AC
  Administered 2018-05-21: 40000 [IU] via SUBCUTANEOUS
  Filled 2018-05-21: qty 1

## 2018-05-21 MED ORDER — EPOETIN ALFA 20000 UNIT/ML IJ SOLN
20000.0000 [IU] | Freq: Once | INTRAMUSCULAR | Status: AC
  Administered 2018-05-21: 20000 [IU] via SUBCUTANEOUS
  Filled 2018-05-21: qty 1

## 2018-05-21 MED ORDER — EPOETIN ALFA 20000 UNIT/ML IJ SOLN
60000.0000 [IU] | Freq: Once | INTRAMUSCULAR | Status: DC
  Filled 2018-05-21: qty 3
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
## 2018-05-21 NOTE — Progress Notes (Signed)
Patient here today for follow up.   

## 2018-05-24 ENCOUNTER — Other Ambulatory Visit: Payer: Self-pay | Admitting: Oncology

## 2018-05-28 ENCOUNTER — Inpatient Hospital Stay: Payer: Medicare Other

## 2018-05-28 VITALS — BP 137/72 | HR 58 | Temp 97.7°F | Resp 18

## 2018-05-28 DIAGNOSIS — N185 Chronic kidney disease, stage 5: Principal | ICD-10-CM

## 2018-05-28 DIAGNOSIS — N184 Chronic kidney disease, stage 4 (severe): Secondary | ICD-10-CM | POA: Diagnosis not present

## 2018-05-28 DIAGNOSIS — D631 Anemia in chronic kidney disease: Secondary | ICD-10-CM

## 2018-05-28 MED ORDER — IRON SUCROSE 20 MG/ML IV SOLN
200.0000 mg | Freq: Once | INTRAVENOUS | Status: AC
  Administered 2018-05-28: 200 mg via INTRAVENOUS
  Filled 2018-05-28: qty 10

## 2018-05-28 MED ORDER — SODIUM CHLORIDE 0.9 % IV SOLN
Freq: Once | INTRAVENOUS | Status: AC
  Administered 2018-05-28: 14:00:00 via INTRAVENOUS
  Filled 2018-05-28: qty 1000

## 2018-06-04 ENCOUNTER — Inpatient Hospital Stay: Payer: Medicare Other

## 2018-06-04 VITALS — BP 116/52 | HR 53 | Temp 97.3°F | Resp 20

## 2018-06-04 DIAGNOSIS — D631 Anemia in chronic kidney disease: Secondary | ICD-10-CM

## 2018-06-04 DIAGNOSIS — N184 Chronic kidney disease, stage 4 (severe): Secondary | ICD-10-CM | POA: Diagnosis not present

## 2018-06-04 DIAGNOSIS — N185 Chronic kidney disease, stage 5: Principal | ICD-10-CM

## 2018-06-04 MED ORDER — SODIUM CHLORIDE 0.9 % IV SOLN
INTRAVENOUS | Status: DC
  Administered 2018-06-04: 14:00:00 via INTRAVENOUS
  Filled 2018-06-04: qty 1000

## 2018-06-04 MED ORDER — IRON SUCROSE 20 MG/ML IV SOLN
200.0000 mg | Freq: Once | INTRAVENOUS | Status: AC
  Administered 2018-06-04: 200 mg via INTRAVENOUS
  Filled 2018-06-04: qty 10

## 2018-06-11 ENCOUNTER — Inpatient Hospital Stay: Payer: Medicare Other | Attending: Oncology

## 2018-06-11 VITALS — BP 145/72 | HR 53 | Temp 97.8°F | Resp 18

## 2018-06-11 DIAGNOSIS — N184 Chronic kidney disease, stage 4 (severe): Secondary | ICD-10-CM | POA: Insufficient documentation

## 2018-06-11 DIAGNOSIS — D649 Anemia, unspecified: Secondary | ICD-10-CM | POA: Diagnosis not present

## 2018-06-11 DIAGNOSIS — Z7901 Long term (current) use of anticoagulants: Secondary | ICD-10-CM | POA: Diagnosis not present

## 2018-06-11 DIAGNOSIS — Z87891 Personal history of nicotine dependence: Secondary | ICD-10-CM | POA: Diagnosis not present

## 2018-06-11 DIAGNOSIS — D631 Anemia in chronic kidney disease: Secondary | ICD-10-CM | POA: Diagnosis present

## 2018-06-11 DIAGNOSIS — R5382 Chronic fatigue, unspecified: Secondary | ICD-10-CM | POA: Diagnosis not present

## 2018-06-11 DIAGNOSIS — F329 Major depressive disorder, single episode, unspecified: Secondary | ICD-10-CM | POA: Diagnosis not present

## 2018-06-11 DIAGNOSIS — E538 Deficiency of other specified B group vitamins: Secondary | ICD-10-CM | POA: Diagnosis not present

## 2018-06-11 DIAGNOSIS — N185 Chronic kidney disease, stage 5: Secondary | ICD-10-CM

## 2018-06-11 MED ORDER — IRON SUCROSE 20 MG/ML IV SOLN
200.0000 mg | Freq: Once | INTRAVENOUS | Status: AC
  Administered 2018-06-11: 200 mg via INTRAVENOUS
  Filled 2018-06-11: qty 10

## 2018-06-11 MED ORDER — SODIUM CHLORIDE 0.9 % IV SOLN
Freq: Once | INTRAVENOUS | Status: AC
  Administered 2018-06-11: 14:00:00 via INTRAVENOUS
  Filled 2018-06-11: qty 1000

## 2018-06-15 ENCOUNTER — Inpatient Hospital Stay: Payer: Medicare Other

## 2018-06-15 DIAGNOSIS — D649 Anemia, unspecified: Secondary | ICD-10-CM

## 2018-06-15 DIAGNOSIS — F32A Depression, unspecified: Secondary | ICD-10-CM

## 2018-06-15 DIAGNOSIS — N184 Chronic kidney disease, stage 4 (severe): Secondary | ICD-10-CM | POA: Diagnosis not present

## 2018-06-15 DIAGNOSIS — Z7901 Long term (current) use of anticoagulants: Secondary | ICD-10-CM

## 2018-06-15 DIAGNOSIS — D631 Anemia in chronic kidney disease: Secondary | ICD-10-CM

## 2018-06-15 DIAGNOSIS — F329 Major depressive disorder, single episode, unspecified: Secondary | ICD-10-CM

## 2018-06-15 LAB — FERRITIN: Ferritin: 467 ng/mL — ABNORMAL HIGH (ref 11–307)

## 2018-06-15 LAB — CBC
HCT: 26.7 % — ABNORMAL LOW (ref 35.0–47.0)
Hemoglobin: 8.7 g/dL — ABNORMAL LOW (ref 12.0–16.0)
MCH: 28.2 pg (ref 26.0–34.0)
MCHC: 32.6 g/dL (ref 32.0–36.0)
MCV: 86.4 fL (ref 80.0–100.0)
PLATELETS: 234 10*3/uL (ref 150–440)
RBC: 3.09 MIL/uL — AB (ref 3.80–5.20)
RDW: 16.3 % — ABNORMAL HIGH (ref 11.5–14.5)
WBC: 6.2 10*3/uL (ref 3.6–11.0)

## 2018-06-15 LAB — IRON AND TIBC
Iron: 45 ug/dL (ref 28–170)
Saturation Ratios: 16 % (ref 10.4–31.8)
TIBC: 274 ug/dL (ref 250–450)
UIBC: 229 ug/dL

## 2018-06-18 ENCOUNTER — Inpatient Hospital Stay: Payer: Medicare Other

## 2018-06-18 ENCOUNTER — Inpatient Hospital Stay (HOSPITAL_BASED_OUTPATIENT_CLINIC_OR_DEPARTMENT_OTHER): Payer: Medicare Other | Admitting: Oncology

## 2018-06-18 ENCOUNTER — Encounter: Payer: Self-pay | Admitting: Oncology

## 2018-06-18 VITALS — BP 160/78 | HR 62 | Temp 96.3°F | Resp 18 | Wt 175.1 lb

## 2018-06-18 DIAGNOSIS — F329 Major depressive disorder, single episode, unspecified: Secondary | ICD-10-CM

## 2018-06-18 DIAGNOSIS — D631 Anemia in chronic kidney disease: Secondary | ICD-10-CM

## 2018-06-18 DIAGNOSIS — Z87891 Personal history of nicotine dependence: Secondary | ICD-10-CM

## 2018-06-18 DIAGNOSIS — F32A Depression, unspecified: Secondary | ICD-10-CM

## 2018-06-18 DIAGNOSIS — R5382 Chronic fatigue, unspecified: Secondary | ICD-10-CM | POA: Diagnosis not present

## 2018-06-18 DIAGNOSIS — Z7901 Long term (current) use of anticoagulants: Secondary | ICD-10-CM | POA: Diagnosis not present

## 2018-06-18 DIAGNOSIS — N184 Chronic kidney disease, stage 4 (severe): Secondary | ICD-10-CM

## 2018-06-18 DIAGNOSIS — N185 Chronic kidney disease, stage 5: Principal | ICD-10-CM

## 2018-06-18 DIAGNOSIS — E538 Deficiency of other specified B group vitamins: Secondary | ICD-10-CM

## 2018-06-18 DIAGNOSIS — D649 Anemia, unspecified: Secondary | ICD-10-CM

## 2018-06-18 MED ORDER — EPOETIN ALFA 40000 UNIT/ML IJ SOLN
40000.0000 [IU] | Freq: Once | INTRAMUSCULAR | Status: AC
  Administered 2018-06-18: 40000 [IU] via SUBCUTANEOUS
  Filled 2018-06-18: qty 1

## 2018-06-18 MED ORDER — EPOETIN ALFA 20000 UNIT/ML IJ SOLN: 60000.0000 [IU] | Freq: Once | INTRAMUSCULAR | Status: DC

## 2018-06-18 MED ORDER — EPOETIN ALFA 20000 UNIT/ML IJ SOLN
20000.0000 [IU] | Freq: Once | INTRAMUSCULAR | Status: AC
  Administered 2018-06-18: 20000 [IU] via SUBCUTANEOUS
  Filled 2018-06-18: qty 1

## 2018-06-18 NOTE — Progress Notes (Signed)
Hematology/Oncology Follow up note [facility] Telephone:([phone] Fax:([phone]) [phone]   Patient Care Team: [name], MD as PCP - General (Internal Medicine)  REFERRING PROVIDER: [name], MD  REASON FOR VISIT Follow up for treatment of anemia of chronic kidney disease.  HISTORY OF PRESENTING ILLNESS:  Jeanette Romero is a  67 y.o.  female with PMH listed below who was referred to me for evaluation of labile INR. She has extensive cardiac comorbidities including history of aortic mechanic valve replacement, aneurysm of aorta, PAF, Coronary artery disease. She was on Amiodarone and also had drug induced hyperthyroidism.  She was sent to me for evaluation of labile INR.  Extensive review of her medical records from primary care physician's office showed  10/27/2017 INR 5.9,  10/24/2017 INR 4.9 10/12/2017 INR 1.2 10/07/2017 INR 6  10/04/2017 INR 8.1   08/11/2017 INR 7.5  05/24/2017 INR 3.4 04/26/2017 INR 3.5 03/15/2017 INR 3.4 01/31/2017 INR 2.7  08/29/2017 Folate 2.8,  B12 825 and the patient was started on folic acid supplementation.  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 Amiodarone was stopped. And patient was referred to endocrinologist for evaluation. She was found out to have Graves' disease and was started on prednisone. Last TSH that was done in the [facility] system showed a TSH of 4.8.  INTERVAL HISTORY Today patient presents for follow-up for management of anemia secondary to chronic kidney disease.  #For her anemia, she has received Procrit 60,000 units 4 weeks ago.  Hemoglobin slightly decreased to 8.7. #Fatigue level, chronic.  Report feeling at baseline today.    management for her anemia.  Of chronic kidney disease.  #For her  anemia, she has received Procrit 60,000 units 4 weeks ago.  Denies any cough swelling, shortness of breath more than her chronic baseline..  Hemoglobin stable at 9.1. #Depressive mood: Today she appears to be in a better mood.  There is some eye interactions with me during the conversation.  She previously declined referral to see psychiatrist.  No suicide ideation.  Review of Systems  Constitutional: Positive for malaise/fatigue. Negative for chills, fever and weight loss.  HENT: Negative for congestion, ear discharge, ear pain, hearing loss, nosebleeds, sinus pain, sore throat and tinnitus.   Eyes: Negative for blurred vision, double vision, photophobia, pain, discharge and redness.  Respiratory: Positive for shortness of breath. Negative for cough, hemoptysis, sputum production and wheezing.   Cardiovascular: Negative for chest pain, palpitations, orthopnea, claudication and leg swelling.  Gastrointestinal: Negative for abdominal pain, blood in stool, constipation, diarrhea, heartburn, melena, nausea and vomiting.  Genitourinary: Negative for dysuria, flank pain, frequency and hematuria.  Musculoskeletal: Negative for back pain, myalgias and neck pain.  Skin: Negative for itching and rash.  Neurological: Negative for dizziness, tingling, tremors, speech change, focal weakness, weakness and headaches.  Endo/Heme/Allergies: Negative for environmental allergies. Does not bruise/bleed easily.  Psychiatric/Behavioral: Positive for depression. Negative for hallucinations and substance abuse. The patient is not nervous/anxious.     MEDICAL HISTORY:  Past Medical History:  Diagnosis Date  . Anemia in chronic kidney disease (CKD) 11/14/2017  . Anxiety   . Aortic valve regurgitation   . Chronic kidney disease   . Coronary artery disease   . Dizziness   . Edema   . GERD (gastroesophageal reflux disease)   .  Gout   . Hypercholesterolemia   . Hypertension   . Shortness of breath dyspnea      SURGICAL HISTORY: Past Surgical History:  Procedure Laterality Date  . ABDOMINAL HYSTERECTOMY    . AORTIC VALVE REPLACEMENT    . CARDIAC CATHETERIZATION    . CARDIAC CATHETERIZATION N/A 05/07/2015   Procedure: Left Heart Cath;  Surgeon: [name], MD;  Location: [place] [facility];  Service: Cardiovascular;  Laterality: N/A;  . CARDIAC VALVE REPLACEMENT    . ELECTROPHYSIOLOGIC STUDY N/A 05/05/2015   Procedure: Cardioversion;  Surgeon: [name], MD;  Location: [facility] ORS;  Service: Cardiovascular;  Laterality: N/A;  . REPLACEMENT TOTAL KNEE BILATERAL      SOCIAL HISTORY: Social History   Socioeconomic History  . Marital status: Married    Spouse name: Not on file  . Number of children: Not on file  . Years of education: Not on file  . Highest education level: Not on file  Occupational History  . Not on file  Social Needs  . Financial resource strain: Not on file  . Food insecurity:    Worry: Not on file    Inability: Not on file  . Transportation needs:    Medical: Not on file    Non-medical: Not on file  Tobacco Use  . Smoking status: Former Smoker    Packs/day: 0.50    Years: 20.00    Pack years: 10.00    Types: Cigarettes    Last attempt to quit: 2004    Years since quitting: 15.5  . Smokeless tobacco: Never Used  Substance and Sexual Activity  . Alcohol use: Yes    Alcohol/week: 0.0 oz    Comment: rare  . Drug use: No  . Sexual activity: Not on file  Lifestyle  . Physical activity:    Days per week: Not on file    Minutes per session: Not on file  . Stress: Not on file  Relationships  . Social connections:    Talks on phone: Not on file    Gets together: Not on file    Attends religious service: Not on file    Active member of club or organization: Not on file    Attends meetings of clubs or organizations: Not on file    Relationship status: Not on file  . Intimate partner violence:    Fear of current or ex partner: Not on file     Emotionally abused: Not on file    Physically abused: Not on file    Forced sexual activity: Not on file  Other Topics Concern  . Not on file  Social History Narrative  . Not on file    FAMILY HISTORY: Family History  Problem Relation Age of Onset  . Hypertension Mother   . Colon cancer Mother   . Prostate cancer Father   . Kidney cancer Brother   . Prostate cancer Brother   . Prostate cancer Brother   . Breast cancer Neg Hx     ALLERGIES:  is allergic to atorvastatin.  MEDICATIONS:  Current Outpatient Medications  Medication Sig Dispense Refill  . ALPRAZolam (XANAX) 0.25 MG tablet Take 0.25 mg by mouth 2 (two) times daily.    Marland Kitchen amLODipine (NORVASC) 10 MG tablet Take 5 mg by mouth daily.     Marland Kitchen aspirin EC 81 MG tablet Take 81 mg by mouth daily.    . benazepril (LOTENSIN) 40 MG tablet Take 40 mg by mouth daily.    Marland Kitchen  cloNIDine (CATAPRES) 0.3 MG tablet Take 0.3 mg 3 (three) times daily by mouth.  2  . folic acid (FOLVITE) 1 MG tablet TAKE 1 TABLET(1 MG) BY MOUTH DAILY 30 tablet 0  . hydrALAZINE (APRESOLINE) 100 MG tablet Take 100 mg 3 (three) times daily by mouth.    . potassium chloride (K-DUR) 10 MEQ tablet Take by mouth.    . rosuvastatin (CRESTOR) 5 MG tablet Take 5 mg by mouth at bedtime.  2  . SODIUM BICARBONATE, ANTACID, PO Take 10 mg by mouth.    . Vitamin D, Ergocalciferol, (DRISDOL) 50000 units CAPS capsule Take 50,000 Units by mouth once a week.    . warfarin (COUMADIN) 2 MG tablet Take 4 mg by mouth daily. Taking 4mg  x 3 days this week 06/18/18    . methimazole (TAPAZOLE) 10 MG tablet Take 20 mg daily by mouth.    . oxyCODONE (OXY IR/ROXICODONE) 5 MG immediate release tablet Take 5 mg by mouth every 4 (four) hours as needed.     No current facility-administered medications for this visit.      PHYSICAL EXAMINATION: ECOG PERFORMANCE STATUS: 1 - Symptomatic but completely ambulatory Vitals:   06/18/18 1044  BP: (!) 160/78  Pulse: 62  Resp: 18  Temp: (!) 96.3 F  (35.7 C)  SpO2: 99%   Filed Weights   06/18/18 1044  Weight: 175 lb 1 oz (79.4 kg)    Physical Exam  Constitutional: She is oriented to person, place, and time and well-developed, well-nourished, and in no distress. No distress.  HENT:  Head: Normocephalic and atraumatic.  Nose: Nose normal.  Mouth/Throat: Oropharynx is clear and moist. No oropharyngeal exudate.  Eyes: Pupils are equal, round, and reactive to light. EOM are normal. Left eye exhibits no discharge. No scleral icterus.  pallor  Neck: Normal range of motion. Neck supple. No JVD present.  Cardiovascular: Normal rate and regular rhythm.  Murmur heard. Pulmonary/Chest: Effort normal. No respiratory distress. She has no wheezes. She has no rales. She exhibits no tenderness.  Decreased breath sounds bilaterally.  No crackles  Abdominal: Soft. Bowel sounds are normal. She exhibits no distension and no mass. There is no tenderness. There is no rebound.  Musculoskeletal: Normal range of motion. She exhibits edema. She exhibits no tenderness.  Lymphadenopathy:    She has no cervical adenopathy.  Neurological: She is alert and oriented to person, place, and time. No cranial nerve deficit. She exhibits normal muscle tone. Gait normal. Coordination normal.  Skin: Skin is warm and dry. No rash noted. She is not diaphoretic. No erythema.  Psychiatric: Affect and judgment normal.  Patient appears to have a depressed mood at every visits.  She has improved eye interaction during our conversation.       LABORATORY DATA:  I have reviewed the data as listed Lab Results  Component Value Date   WBC 6.2 06/15/2018   HGB 8.7 (L) 06/15/2018   HCT 26.7 (L) 06/15/2018   MCV 86.4 06/15/2018   PLT 234 06/15/2018   Recent Labs    10/31/17 0950 03/13/18 1040 04/01/18 0831  NA 139 141 140  K 4.9 3.4* 3.0*  CL 107 108 105  CO2 24 23 26   GLUCOSE 130* 102* 99  BUN 68* 18 21*  CREATININE 2.13* 2.18* 1.94*  CALCIUM 9.3 9.2 9.6    GFRNONAA 23* 22* 26*  GFRAA 27* 26* 30*  PROT 6.4* 6.5  --   ALBUMIN 3.3* 3.2*  --   AST 35  14*  --   ALT 63* 9*  --   ALKPHOS 84 93  --   BILITOT 0.7 0.6  --    Iron/TIBC/Ferritin/ %Sat    Component Value Date/Time   IRON 45 06/15/2018 1101   TIBC 274 06/15/2018 1101   FERRITIN 467 (H) 06/15/2018 1101   IRONPCTSAT 16 06/15/2018 1101    08/29/2017 Folate 2.8,  B12 825               01/02/2018 TSH 3.05                                                                                                                                                                                                                                                                                                                                                                                                                                                                                                                 ASSESSMENT & PLAN:  1. Normocytic anemia   2. Fatigue associated with anemia   3. 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 Chronic anticoagulation   5. Depression, unspecified depression type    #Anemia of chronic kidney disease.  Hemoglobin slightly decreased at 8.7.  Tolerates Procrit 60,000 units every 4 weeks.  Proceed with today's dose.  If hemoglobin continues to drop, will increase Procrit and next visit.  # Functional iron deficiency: Improved.  Iron levels reviewed and discussed with patient.  Status post Venofer weekly x4.  Her iron saturation is 16, which is acceptable.  Ferritin is above 400s which can be secondary to chronic inflammatory process in CKD.  Continue monitor iron panel.  She may need additional IV iron in the future.  #Given that she is on chronic anticoagulation and recommend patient to follow-up with her GI doctor for assessment if she has ongoing GI blood loss.  Patient said that she is going to discuss with primary care physician and to reestablish care with  GI physician.  #History of folic acid deficiency: Continue folic acid 1 mg daily.  #Chronic anticoagulation, follow-up with PCP for Coumadin titrating. #Depression:   She denies any suicide ideation.  Previously declined psychiatry evaluation.  Clinically appear better today.  Has some eye interaction with me during the conversation.  All questions were answered. The patient knows to call the clinic with any problems questions or concerns.  Return of visit: 4 weeks for Procrit and repeat CBC, iron TIBC ferritin.  Earlie Server, MD, PhD Hematology Oncology [facility] at [facility] Pager- [phone] 06/18/18

## 2018-06-18 NOTE — Progress Notes (Signed)
Pt in for follow up states "doing well'.  Denies any concerns today .

## 2018-06-23 ENCOUNTER — Other Ambulatory Visit: Payer: Self-pay | Admitting: Oncology

## 2018-06-28 ENCOUNTER — Other Ambulatory Visit: Payer: Self-pay

## 2018-06-28 ENCOUNTER — Emergency Department
Admission: EM | Admit: 2018-06-28 | Discharge: 2018-06-28 | Disposition: A | Discharge status: Home / Self Care | Payer: Medicare Other | Attending: Emergency Medicine | Admitting: Emergency Medicine

## 2018-06-28 ENCOUNTER — Emergency Department: Payer: Medicare Other

## 2018-06-28 DIAGNOSIS — Z87891 Personal history of nicotine dependence: Secondary | ICD-10-CM | POA: Diagnosis not present

## 2018-06-28 DIAGNOSIS — I2510 Atherosclerotic heart disease of native coronary artery without angina pectoris: Secondary | ICD-10-CM | POA: Diagnosis not present

## 2018-06-28 DIAGNOSIS — Z7982 Long term (current) use of aspirin: Secondary | ICD-10-CM | POA: Insufficient documentation

## 2018-06-28 DIAGNOSIS — Z7901 Long term (current) use of anticoagulants: Secondary | ICD-10-CM | POA: Diagnosis not present

## 2018-06-28 DIAGNOSIS — R222 Localized swelling, mass and lump, trunk: Secondary | ICD-10-CM | POA: Diagnosis present

## 2018-06-28 DIAGNOSIS — R1903 Right lower quadrant abdominal swelling, mass and lump: Secondary | ICD-10-CM | POA: Insufficient documentation

## 2018-06-28 DIAGNOSIS — N189 Chronic kidney disease, unspecified: Secondary | ICD-10-CM | POA: Diagnosis not present

## 2018-06-28 DIAGNOSIS — Z79899 Other long term (current) drug therapy: Secondary | ICD-10-CM | POA: Diagnosis not present

## 2018-06-28 DIAGNOSIS — E0590 Thyrotoxicosis, unspecified without thyrotoxic crisis or storm: Secondary | ICD-10-CM | POA: Insufficient documentation

## 2018-06-28 DIAGNOSIS — I129 Hypertensive chronic kidney disease with stage 1 through stage 4 chronic kidney disease, or unspecified chronic kidney disease: Secondary | ICD-10-CM | POA: Diagnosis not present

## 2018-06-28 LAB — COMPREHENSIVE METABOLIC PANEL
ALBUMIN: 3.9 g/dL (ref 3.5–5.0)
ALT: 10 U/L (ref 0–44)
AST: 18 U/L (ref 15–41)
Alkaline Phosphatase: 156 U/L — ABNORMAL HIGH (ref 38–126)
Anion gap: 10 (ref 5–15)
BUN: 30 mg/dL — AB (ref 8–23)
CHLORIDE: 110 mmol/L (ref 98–111)
CO2: 24 mmol/L (ref 22–32)
CREATININE: 2.08 mg/dL — AB (ref 0.44–1.00)
Calcium: 10.1 mg/dL (ref 8.9–10.3)
GFR calc Af Amer: 27 mL/min — ABNORMAL LOW (ref >60)
GFR, EST NON AFRICAN AMERICAN: 24 mL/min — AB (ref >60)
GLUCOSE: 92 mg/dL (ref 70–99)
Potassium: 4.5 mmol/L (ref 3.5–5.1)
SODIUM: 144 mmol/L (ref 135–145)
Total Bilirubin: 0.5 mg/dL (ref 0.3–1.2)
Total Protein: 7.5 g/dL (ref 6.5–8.1)

## 2018-06-28 LAB — CBC WITH DIFFERENTIAL/PLATELET
BASOS PCT: 1 %
Basophils Absolute: 0 10*3/uL (ref 0–0.1)
EOS PCT: 0 %
Eosinophils Absolute: 0 10*3/uL (ref 0–0.7)
HCT: 31 % — ABNORMAL LOW (ref 35.0–47.0)
HEMOGLOBIN: 9.8 g/dL — AB (ref 12.0–16.0)
Lymphocytes Relative: 15 %
Lymphs Abs: 0.8 10*3/uL — ABNORMAL LOW (ref 1.0–3.6)
MCH: 27.7 pg (ref 26.0–34.0)
MCHC: 31.4 g/dL — ABNORMAL LOW (ref 32.0–36.0)
MCV: 88.2 fL (ref 80.0–100.0)
Monocytes Absolute: 0.4 10*3/uL (ref 0.2–0.9)
Monocytes Relative: 8 %
NEUTROS PCT: 76 %
Neutro Abs: 4 10*3/uL (ref 1.4–6.5)
PLATELETS: 222 10*3/uL (ref 150–440)
RBC: 3.52 MIL/uL — AB (ref 3.80–5.20)
RDW: 17.7 % — ABNORMAL HIGH (ref 11.5–14.5)
WBC: 5.3 10*3/uL (ref 3.6–11.0)

## 2018-06-28 LAB — PROTIME-INR
INR: 1.35
PROTHROMBIN TIME: 16.6 s — AB (ref 11.4–15.2)

## 2018-06-28 LAB — LIPASE, BLOOD: LIPASE: 50 U/L (ref 11–51)

## 2018-06-28 MED ORDER — SODIUM CHLORIDE 0.9 % IV BOLUS: 1000.0000 mL | Freq: Once | INTRAVENOUS | Status: DC

## 2018-06-28 NOTE — ED Notes (Addendum)
Report off to [employer]

## 2018-06-28 NOTE — ED Notes (Signed)
This RN went into room to start IV for Protime INR and give fluids however pt and family declined this and would like an update from [address], [place] notified and states will follow up.

## 2018-06-28 NOTE — ED Notes (Signed)
Pt reports she has swelling to right side of abdomen where a drain is from an aneurysm.  Pt is concerned the swelling is from the drain.  No abd pain.  No n/v/d.  Pt alert  Speech clear.

## 2018-06-28 NOTE — ED Notes (Signed)
Per EDP, no IV at this time

## 2018-06-28 NOTE — ED Provider Notes (Addendum)
[facility] Emergency Department Provider Note ____________________________________________   First MD Initiated Contact with Patient 06/28/18 1725     (approximate)  I have reviewed the triage vital signs and the nursing notes.   HISTORY  Chief Complaint Abdominal Pain   HPI Jeanette Romero is a 67 y.o. 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  Says that she can usually feel a graft to the right flank but she has now developed a mass over the right hip which he thinks may be related to her stenting.  Otherwise, she denies any complaints.  No weakness or numbness to the bilateral lower extremities.  No nausea vomiting or diarrhea.  No abdominal pain. Past Medical History:  Diagnosis Date  . Anemia in chronic kidney disease (CKD) 11/14/2017  . Anxiety   . Aortic valve regurgitation   . Chronic kidney disease   . Coronary artery disease   . Dizziness   . Edema   . GERD (gastroesophageal reflux disease)   . Gout   . Hypercholesterolemia   . Hypertension   . Shortness of breath dyspnea     Patient Active Problem List   Diagnosis Date Noted  . Anemia in chronic kidney disease (CKD) 11/14/2017  . Normocytic anemia 10/31/2017  . Fatigue associated with anemia 10/31/2017  . Depression 10/31/2017  . Hyperthyroidism 10/31/2017  . Chronic anticoagulation 10/31/2017  . Bradycardia 05/05/2015    Past Surgical History:  Procedure Laterality Date  . ABDOMINAL HYSTERECTOMY    . AORTIC VALVE REPLACEMENT    . CARDIAC CATHETERIZATION    . CARDIAC CATHETERIZATION N/A 05/07/2015   Procedure: Left Heart Cath;  Surgeon: Dionisio Archie Shea, MD;  Location: [place] [facility];  Service: Cardiovascular;  Laterality: N/A;  . CARDIAC VALVE REPLACEMENT    . ELECTROPHYSIOLOGIC STUDY N/A 05/05/2015   Procedure:  Cardioversion;  Surgeon: Dionisio Rayan Dyal, MD;  Location: [facility] ORS;  Service: Cardiovascular;  Laterality: N/A;  . REPLACEMENT TOTAL KNEE BILATERAL      Prior to Admission medications   Medication Sig Start Date End Date Taking? Authorizing Provider  ALPRAZolam (XANAX) 0.25 MG tablet Take 0.25 mg by mouth 2 (two) times daily.    [provider]  amLODipine (NORVASC) 10 MG tablet Take 5 mg by mouth daily.     [provider]  aspirin EC 81 MG tablet Take 81 mg by mouth daily.    [provider]  benazepril (LOTENSIN) 40 MG tablet Take 40 mg by mouth daily.    [provider]  cloNIDine (CATAPRES) 0.3 MG tablet Take 0.3 mg 3 (three) times daily by mouth. 10/14/17   [provider]  folic acid (FOLVITE) 1 MG tablet TAKE 1 TABLET(1 MG) BY MOUTH DAILY 03/11/18   Earlie Server, MD  folic acid (FOLVITE) 1 MG tablet TAKE 1 TABLET(1 MG) BY MOUTH DAILY 06/28/18   Earlie Server, MD  hydrALAZINE (APRESOLINE) 100 MG tablet Take 100 mg 3 (three) times daily by mouth.    [provider]  methimazole (TAPAZOLE) 10 MG tablet Take 20 mg daily by mouth. 10/27/17 12/26/17  [provider]  oxyCODONE (OXY IR/ROXICODONE) 5 MG immediate release tablet Take 5 mg by mouth every 4 (four) hours as needed. 04/15/18   [provider]  potassium chloride (K-DUR) 10 MEQ tablet Take by mouth. 05/04/18  05/04/19  [provider]  rosuvastatin (CRESTOR) 5 MG tablet Take 5 mg by mouth at bedtime. 02/20/18   [provider]  SODIUM BICARBONATE, ANTACID, PO Take 10 mg by mouth.    [provider]  Vitamin D, Ergocalciferol, (DRISDOL) 50000 units CAPS capsule Take 50,000 Units by mouth once a week. 03/01/18   [provider]  warfarin (COUMADIN) 2 MG tablet Take 4 mg by mouth daily. Taking 4mg  x 3 days this week 06/18/18    [provider]    Allergies Atorvastatin  Family History  Problem Relation Age of Onset  . Hypertension Mother     . Colon cancer Mother   . Prostate cancer Father   . Kidney cancer Brother   . Prostate cancer Brother   . Prostate cancer Brother   . Breast cancer Neg Hx     Social History Social History   Tobacco Use  . Smoking status: Former Smoker    Packs/day: 0.50    Years: 20.00    Pack years: 10.00    Types: Cigarettes    Last attempt to quit: 2004    Years since quitting: 15.5  . Smokeless tobacco: Never Used  Substance Use Topics  . Alcohol use: Yes    Alcohol/week: 0.0 oz    Comment: rare  . Drug use: No    Review of Systems  Constitutional: No fever/chills Eyes: No visual changes. ENT: No sore throat. Cardiovascular: Denies chest pain. Respiratory: Denies shortness of breath. Gastrointestinal: No abdominal pain.  No nausea, no vomiting.  No diarrhea.  No constipation. Genitourinary: Negative for dysuria. Musculoskeletal: Negative for back pain. Skin: Negative for rash. Neurological: Negative for headaches, focal weakness or numbness.   ____________________________________________   PHYSICAL EXAM:  VITAL SIGNS: ED Triage Vitals [06/28/18 1615]  Enc Vitals Group     BP (!) 149/69     Pulse Rate 62     Resp 18     Temp 98 F (36.7 C)     Temp Source Oral     SpO2 99 %     Weight 176 lb (79.8 kg)     Height 5' 2.5" (1.588 m)     Head Circumference      Peak Flow      Pain Score 0     Pain Loc      Pain Edu?      Excl. in [place]?     Constitutional: Alert and oriented. Well appearing and in no acute distress. Eyes: Conjunctivae are normal.  Head: Atraumatic. Nose: No congestion/rhinnorhea. Mouth/Throat: Mucous membranes are moist.  Neck: No stridor.   Cardiovascular: Normal rate, regular rhythm. Grossly normal heart sounds.  Good peripheral circulation with equal and bilateral dorsalis pedis pulses. Respiratory: Normal respiratory effort.  No retractions. Lungs CTAB. Gastrointestinal: Soft and nontender. No distention. No CVA tenderness.  Just lateral  to the right ASIS there is an approximately 4 cm, spherical mass palpated.  It appears to be connected to the patient's palpable graft, more proximal in the flank.  No pulsatile mass.  Musculoskeletal: No lower extremity tenderness nor edema.  No joint effusions. Neurologic:  Normal speech and language. No gross focal neurologic deficits are appreciated. Skin:  Skin is warm, dry and intact. No rash noted. Psychiatric: Mood and affect are normal. Speech and behavior are normal.  ____________________________________________   LABS (all labs ordered are listed, but only abnormal results are displayed)  Labs Reviewed  CBC WITH DIFFERENTIAL/PLATELET - Abnormal; Notable  for the following components:      Result Value   RBC 3.52 (*)    Hemoglobin 9.8 (*)    HCT 31.0 (*)    MCHC 31.4 (*)    RDW 17.7 (*)    Lymphs Abs 0.8 (*)    All other components within normal limits  COMPREHENSIVE METABOLIC PANEL - Abnormal; Notable for the following components:   BUN 30 (*)    Creatinine, Ser 2.08 (*)    Alkaline Phosphatase 156 (*)    GFR calc non Af Amer 24 (*)    GFR calc Af Amer 27 (*)    All other components within normal limits  LIPASE, BLOOD  PROTIME-INR   ____________________________________________  EKG   ____________________________________________  RADIOLOGY  Extensive vascular disease with evidence of chronic calcified dissection in the visualized distal thoracic aorta.  Femorofemoral crossover graft and right axillofemoral bypass graft noted.  Rounded fluid collection surrounds the distal aspect of the axillofemoral bypass graft.  Presumably postoperative seroma.  2 cm soft tissue nodule in the left posterior mediastinum.  Recommend repeat CT in 6 months to assess stability. ____________________________________________   PROCEDURES  Procedure(s) performed:   Procedures  Critical Care performed:   ____________________________________________   INITIAL IMPRESSION /  ASSESSMENT AND PLAN / ED COURSE  Pertinent labs & imaging results that were available during my care of the patient were reviewed by me and considered in my medical decision making (see chart for details).  DDX: Displaced hardware, abdominal mass, medical screening exam As part of my medical decision making, I reviewed the following data within the [place] note from urgent care earlier today.  ----------------------------------------- 9:07 PM on 06/28/2018 -----------------------------------------  Discussed the case with [facility] radiology.  He states that the Hounsfield units are much lower than what would be found if this was blood.  Also discussed the case with Dr. Lucky Cowboy of vascular surgery.  He says it would be highly unlikely for this to be a leak the bypass graft.  He recommends that be followed in the office with ultrasound and possibly drained a later time.  I discussed the CT results with the patient as well as family.  We also reviewed the CT images on the computer.  Patient is saying that the mass has not increased in size over the course of the day.  I reexamined it and there is no ecchymosis.  She is amenable to the plan to follow-up with vascular at [facility] where she has been seen in the past.  We discussed return precautions to come back immediately for any increase in size or evidence of bruising over the mass.  She is understanding of the diagnosis as well as treatment plan willing to comply. ____________________________________________   FINAL CLINICAL IMPRESSION(S) / ED DIAGNOSES  Abdominal wall mass.   NEW MEDICATIONS STARTED DURING THIS VISIT:  New Prescriptions   No medications on file     Note:  This document was prepared using Dragon voice recognition software and may include unintentional dictation errors.     Bianney Rockwood, Randall An, MD 06/28/18 2109  I called the patient on the phone to update her about the INR of 1.35.  She is  also aware of the soft tissue nodule in the need for six-month follow-up.  She is understanding of these diagnoses and treatment plans willing to comply.    Orbie Pyo, MD 06/28/18 2214

## 2018-06-28 NOTE — ED Notes (Signed)
Patient transported to CT 

## 2018-06-28 NOTE — ED Triage Notes (Addendum)
Pt comes via POV with c/o right sided knot that are not painful. Pt states she noticed it this morning.   Pt denies pain, fever, chills, N/V. Pt is alert and oriented.

## 2018-06-28 NOTE — ED Notes (Signed)

## 2018-07-13 ENCOUNTER — Inpatient Hospital Stay: Payer: Medicare Other | Attending: Oncology

## 2018-07-13 ENCOUNTER — Other Ambulatory Visit: Payer: Medicare Other

## 2018-07-13 DIAGNOSIS — E538 Deficiency of other specified B group vitamins: Secondary | ICD-10-CM | POA: Diagnosis not present

## 2018-07-13 DIAGNOSIS — D649 Anemia, unspecified: Secondary | ICD-10-CM

## 2018-07-13 DIAGNOSIS — N185 Chronic kidney disease, stage 5: Secondary | ICD-10-CM | POA: Insufficient documentation

## 2018-07-13 DIAGNOSIS — Z87891 Personal history of nicotine dependence: Secondary | ICD-10-CM | POA: Diagnosis not present

## 2018-07-13 DIAGNOSIS — Z7901 Long term (current) use of anticoagulants: Secondary | ICD-10-CM | POA: Diagnosis not present

## 2018-07-13 DIAGNOSIS — D509 Iron deficiency anemia, unspecified: Secondary | ICD-10-CM | POA: Insufficient documentation

## 2018-07-13 DIAGNOSIS — D631 Anemia in chronic kidney disease: Secondary | ICD-10-CM | POA: Diagnosis present

## 2018-07-13 DIAGNOSIS — F329 Major depressive disorder, single episode, unspecified: Secondary | ICD-10-CM | POA: Diagnosis not present

## 2018-07-13 LAB — CBC
HEMATOCRIT: 27.3 % — AB (ref 35.0–47.0)
Hemoglobin: 8.8 g/dL — ABNORMAL LOW (ref 12.0–16.0)
MCH: 27.5 pg (ref 26.0–34.0)
MCHC: 32.5 g/dL (ref 32.0–36.0)
MCV: 84.8 fL (ref 80.0–100.0)
Platelets: 215 10*3/uL (ref 150–440)
RBC: 3.21 MIL/uL — ABNORMAL LOW (ref 3.80–5.20)
RDW: 16.2 % — AB (ref 11.5–14.5)
WBC: 4.7 10*3/uL (ref 3.6–11.0)

## 2018-07-16 ENCOUNTER — Other Ambulatory Visit: Payer: Self-pay

## 2018-07-16 ENCOUNTER — Inpatient Hospital Stay: Payer: Medicare Other

## 2018-07-16 ENCOUNTER — Inpatient Hospital Stay (HOSPITAL_BASED_OUTPATIENT_CLINIC_OR_DEPARTMENT_OTHER): Payer: Medicare Other | Admitting: Oncology

## 2018-07-16 ENCOUNTER — Ambulatory Visit: Payer: Medicare Other | Admitting: Oncology

## 2018-07-16 ENCOUNTER — Ambulatory Visit: Payer: Medicare Other

## 2018-07-16 ENCOUNTER — Encounter: Payer: Self-pay | Admitting: Oncology

## 2018-07-16 VITALS — BP 147/67 | HR 54 | Temp 97.7°F | Resp 18 | Wt 175.5 lb

## 2018-07-16 DIAGNOSIS — D509 Iron deficiency anemia, unspecified: Secondary | ICD-10-CM | POA: Diagnosis not present

## 2018-07-16 DIAGNOSIS — E538 Deficiency of other specified B group vitamins: Secondary | ICD-10-CM | POA: Diagnosis not present

## 2018-07-16 DIAGNOSIS — D631 Anemia in chronic kidney disease: Secondary | ICD-10-CM

## 2018-07-16 DIAGNOSIS — D649 Anemia, unspecified: Secondary | ICD-10-CM

## 2018-07-16 DIAGNOSIS — R5382 Chronic fatigue, unspecified: Secondary | ICD-10-CM

## 2018-07-16 DIAGNOSIS — N185 Chronic kidney disease, stage 5: Secondary | ICD-10-CM

## 2018-07-16 DIAGNOSIS — Z87891 Personal history of nicotine dependence: Secondary | ICD-10-CM

## 2018-07-16 DIAGNOSIS — F329 Major depressive disorder, single episode, unspecified: Secondary | ICD-10-CM

## 2018-07-16 DIAGNOSIS — F32A Depression, unspecified: Secondary | ICD-10-CM

## 2018-07-16 DIAGNOSIS — Z7901 Long term (current) use of anticoagulants: Secondary | ICD-10-CM

## 2018-07-16 MED ORDER — EPOETIN ALFA 20000 UNIT/ML IJ SOLN
20000.0000 [IU] | Freq: Once | INTRAMUSCULAR | Status: AC
  Administered 2018-07-16: 20000 [IU] via SUBCUTANEOUS
  Filled 2018-07-16: qty 1

## 2018-07-16 MED ORDER — EPOETIN ALFA 40000 UNIT/ML IJ SOLN
40000.0000 [IU] | Freq: Once | INTRAMUSCULAR | Status: AC
  Administered 2018-07-16: 40000 [IU] via SUBCUTANEOUS
  Filled 2018-07-16: qty 1

## 2018-07-16 MED ORDER — EPOETIN ALFA 20000 UNIT/ML IJ SOLN: 60000.0000 [IU] | Freq: Once | INTRAMUSCULAR | Status: DC

## 2018-07-16 NOTE — Progress Notes (Signed)
Hematology/Oncology Follow up note [name] Telephone:([phone]) [phone] Fax:([phone]) [phone]   Patient Care Team: [name], MD as PCP - General (Internal Medicine)  REFERRING PROVIDER: [name], MD  REASON FOR VISIT Follow up for treatment of anemia of chronic kidney disease.  HISTORY OF PRESENTING ILLNESS:  Jeanette Romero is a  67 y.o.  female with PMH listed below who was referred to me for evaluation of labile INR. She has extensive cardiac comorbidities including history of aortic mechanic valve replacement, aneurysm of aorta, PAF, Coronary artery disease. She was on Amiodarone and also had drug induced hyperthyroidism.  She was sent to me for evaluation of labile INR.  Extensive review of her medical records from primary care physician's office showed  10/27/2017 INR 5.9,  10/24/2017 INR 4.9 10/12/2017 INR 1.2 10/07/2017 INR 6  10/04/2017 INR 8.1   08/11/2017 INR 7.5  05/24/2017 INR 3.4 04/26/2017 INR 3.5 03/15/2017 INR 3.4 01/31/2017 INR 2.7  08/29/2017 Folate 2.8,  B12 825 and the patient was started on folic acid supplementation.  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 Amiodarone was stopped. And patient was referred to endocrinologist for evaluation. She was found out to have Graves' disease and was started on prednisone. Last TSH that was done in the [place] system showed a TSH of 4.8.  INTERVAL HISTORY Today patient presents for follow-up for management of anemia secondary to chronic kidney disease. She has been received Procrit 60,000 every 4 weeks.  Hemoglobin stable between 8.529. Chronic fatigue, report feeling at baseline today.  #Depressive mood: Today she appears to be good mood.  There is eye interaction with me during  conversation.  She previously declined referral to see psychiatrist.  No suicidal ideation.   Review of Systems  Constitutional: Positive for malaise/fatigue. Negative for chills, fever and weight loss.  HENT: Negative for congestion, ear discharge, ear pain, hearing loss, nosebleeds, sinus pain, sore throat and tinnitus.   Eyes: Negative for blurred vision, double vision, photophobia, pain, discharge and redness.  Respiratory: Positive for shortness of breath. Negative for cough, hemoptysis, sputum production and wheezing.   Cardiovascular: Negative for chest pain, palpitations, orthopnea, claudication and leg swelling.  Gastrointestinal: Negative for abdominal pain, blood in stool, constipation, diarrhea, heartburn, melena, nausea and vomiting.  Genitourinary: Negative for dysuria, flank pain, frequency and hematuria.  Musculoskeletal: Negative for back pain, myalgias and neck pain.  Skin: Negative for itching and rash.  Neurological: Negative for dizziness, tingling, tremors, speech change, focal weakness, weakness and headaches.  Endo/Heme/Allergies: Negative for environmental allergies. Does not bruise/bleed easily.  Psychiatric/Behavioral: Positive for depression. Negative for hallucinations and substance abuse. The patient is not nervous/anxious.     MEDICAL HISTORY:  Past Medical History:  Diagnosis Date  . Anemia in chronic kidney disease (CKD) 11/14/2017  . Anxiety   . Aortic valve regurgitation   . Chronic kidney disease   . Coronary artery disease   . Dizziness   . Edema   . GERD (gastroesophageal reflux disease)   . Gout   . Hypercholesterolemia   . Hypertension   . Shortness of breath dyspnea     SURGICAL HISTORY: Past Surgical History:  Procedure Laterality Date  . ABDOMINAL HYSTERECTOMY    . AORTIC VALVE REPLACEMENT    . CARDIAC CATHETERIZATION    . CARDIAC CATHETERIZATION  N/A 05/07/2015   Procedure: Left Heart Cath;  Surgeon: [name], MD;  Location:  [facility];  Service: Cardiovascular;  Laterality: N/A;  . CARDIAC VALVE REPLACEMENT    . ELECTROPHYSIOLOGIC STUDY N/A 05/05/2015   Procedure: Cardioversion;  Surgeon: [name], MD;  Location: [facility];  Service: Cardiovascular;  Laterality: N/A;  . REPLACEMENT TOTAL KNEE BILATERAL      SOCIAL HISTORY: Social History   Socioeconomic History  . Marital status: Married    Spouse name: Not on file  . Number of children: Not on file  . Years of education: Not on file  . Highest education level: Not on file  Occupational History  . Not on file  Social Needs  . Financial resource strain: Not on file  . Food insecurity:    Worry: Not on file    Inability: Not on file  . Transportation needs:    Medical: Not on file    Non-medical: Not on file  Tobacco Use  . Smoking status: Former Smoker    Packs/day: 0.50    Years: 20.00    Pack years: 10.00    Types: Cigarettes    Last attempt to quit: 2004    Years since quitting: 15.6  . Smokeless tobacco: Never Used  Substance and Sexual Activity  . Alcohol use: Yes    Alcohol/week: 0.0 oz    Comment: rare  . Drug use: No  . Sexual activity: Not on file  Lifestyle  . Physical activity:    Days per week: Not on file    Minutes per session: Not on file  . Stress: Not on file  Relationships  . Social connections:    Talks on phone: Not on file    Gets together: Not on file    Attends religious service: Not on file    Active member of club or organization: Not on file    Attends meetings of clubs or organizations: Not on file    Relationship status: Not on file  . Intimate partner violence:    Fear of current or ex partner: Not on file    Emotionally abused: Not on file    Physically abused: Not on file    Forced sexual activity: Not on file  Other Topics Concern  . Not on file  Social History Narrative  . Not on file    FAMILY HISTORY: Family History  Problem Relation Age of Onset  . Hypertension Mother     . Colon cancer Mother   . Prostate cancer Father   . Kidney cancer Brother   . Prostate cancer Brother   . Prostate cancer Brother   . Breast cancer Neg Hx     ALLERGIES:  is allergic to atorvastatin.  MEDICATIONS:  Current Outpatient Medications  Medication Sig Dispense Refill  . ALPRAZolam (XANAX) 0.25 MG tablet Take 0.25 mg by mouth 2 (two) times daily.    Marland Kitchen amLODipine (NORVASC) 10 MG tablet Take 5 mg by mouth daily.     Marland Kitchen aspirin EC 81 MG tablet Take 81 mg by mouth daily.    . benazepril (LOTENSIN) 40 MG tablet Take 40 mg by mouth daily.    . cloNIDine (CATAPRES) 0.3 MG tablet Take 0.3 mg 3 (three) times daily by mouth.  2  . folic acid (FOLVITE) 1 MG tablet TAKE 1 TABLET(1 MG) BY MOUTH DAILY 30 tablet 0  . hydrALAZINE (APRESOLINE) 100 MG tablet Take 100 mg 3 (three) times daily  by mouth.    . methimazole (TAPAZOLE) 5 MG tablet   11  . potassium chloride (K-DUR) 10 MEQ tablet Take by mouth.    . rosuvastatin (CRESTOR) 5 MG tablet Take 5 mg by mouth at bedtime.  2  . SODIUM BICARBONATE, ANTACID, PO Take 10 mg by mouth.    . warfarin (COUMADIN) 2 MG tablet Take 4 mg by mouth daily. Taking 4mg  x 3 days this week 06/18/18    . folic acid (FOLVITE) 1 MG tablet TAKE 1 TABLET(1 MG) BY MOUTH DAILY (Patient not taking: Reported on 07/16/2018) 30 tablet 0  . methimazole (TAPAZOLE) 10 MG tablet Take 20 mg daily by mouth.    . oxyCODONE (OXY IR/ROXICODONE) 5 MG immediate release tablet Take 5 mg by mouth every 4 (four) hours as needed.    . Vitamin D, Ergocalciferol, (DRISDOL) 50000 units CAPS capsule Take 50,000 Units by mouth once a week.     No current facility-administered medications for this visit.      PHYSICAL EXAMINATION: ECOG PERFORMANCE STATUS: 1 - Symptomatic but completely ambulatory Vitals:   07/16/18 0942  BP: (!) 147/67  Pulse: (!) 54  Resp: 18  Temp: 97.7 F (36.5 C)  SpO2: 98%   Filed Weights   07/16/18 0942  Weight: 175 lb 8 oz (79.6 kg)    Physical Exam   Constitutional: She is oriented to person, place, and time and well-developed, well-nourished, and in no distress. No distress.  HENT:  Head: Normocephalic and atraumatic.  Nose: Nose normal.  Mouth/Throat: Oropharynx is clear and moist. No oropharyngeal exudate.  Eyes: Pupils are equal, round, and reactive to light. EOM are normal. Left eye exhibits no discharge. No scleral icterus.  pallor  Neck: Normal range of motion. Neck supple. No JVD present.  Cardiovascular: Normal rate and regular rhythm.  Murmur heard. Pulmonary/Chest: Effort normal. No respiratory distress. She has no wheezes. She has no rales. She exhibits no tenderness.  Decreased breath sounds bilaterally.  No crackles  Abdominal: Soft. Bowel sounds are normal. She exhibits no distension and no mass. There is no tenderness. There is no rebound.  Musculoskeletal: Normal range of motion. She exhibits edema. She exhibits no tenderness.  Lymphadenopathy:    She has no cervical adenopathy.  Neurological: She is alert and oriented to person, place, and time. No cranial nerve deficit. She exhibits normal muscle tone. Gait normal. Coordination normal.  Skin: Skin is warm and dry. No rash noted. She is not diaphoretic. No erythema.  Psychiatric: Affect and judgment normal.  Patient appears to have a depressed mood at every visits.  She has improved eye interaction during our conversation.       LABORATORY DATA:  I have reviewed the data as listed Lab Results  Component Value Date   WBC 4.7 07/13/2018   HGB 8.8 (L) 07/13/2018   HCT 27.3 (L) 07/13/2018   MCV 84.8 07/13/2018   PLT 215 07/13/2018   Recent Labs    10/31/17 0950 03/13/18 1040 04/01/18 0831 06/28/18 1841  NA 139 141 140 144  K 4.9 3.4* 3.0* 4.5  CL 107 108 105 110  CO2 24 23 26 24   GLUCOSE 130* 102* 99 92  BUN 68* 18 21* 30*  CREATININE 2.13* 2.18* 1.94* 2.08*  CALCIUM 9.3 9.2 9.6 10.1  GFRNONAA 23* 22* 26* 24*  GFRAA 27* 26* 30* 27*  PROT 6.4* 6.5   --  7.5  ALBUMIN 3.3* 3.2*  --  3.9  AST 35 14*  --  18  ALT 63* 9*  --  10  ALKPHOS 84 93  --  156*  BILITOT 0.7 0.6  --  0.5   Iron/TIBC/Ferritin/ %Sat    Component Value Date/Time   IRON 45 06/15/2018 1101   TIBC 274 06/15/2018 1101   FERRITIN 467 (H) 06/15/2018 1101   IRONPCTSAT 16 06/15/2018 1101    08/29/2017 Folate 2.8,  B12 825               01/02/2018 TSH 3.05                                                                                                                                                                                                                                                                                                                                                                                                                                                                                                                 ASSESSMENT & PLAN:  1. Anemia in stage 5 chronic kidney disease, not on chronic dialysis ([place])  2. Normocytic anemia   3. Fatigue associated with anemia   4. Chronic anticoagulation   5. Depression, unspecified depression type   6. Iron deficiency anemia, unspecified iron deficiency anemia type    #Anemia of chronic kidney disease.  Hemoglobin stable at 8.8, Labs reviewed and discussed with patient proceed with Procrit 60,000 unit every 4 weeks.  Proceed with today's dose. I have discussed with patient to increase Procrit injection to every 2 weeks.  Patient reports that she has lot of doctor's appointment and she also want to keep her job.  Prefers not to increase frequencies at this moment.  #Functional iron deficiency.  Ferritin 467, saturation 16.  Patient is on chronic anticoagulation may have functional iron deficiency  given that patient has chronic kidney disease, with borderline iron saturation of 16.  We will proceed with Venofer 200 mg x 1 before next visit.    #Given that she is on chronic anticoagulation: I have  previously discussed with patient for GI referral.  Patient prefers to talk to primary care doctor for further discussion. #History of folic acid deficiency: Continue folic acid 1 mg daily. #Depression:   Symptoms stable.  Previously declined psychiatry referral.  All questions were answered. The patient knows to call the clinic with any problems questions or concerns. Orders Placed This Encounter  Procedures  . Iron and TIBC    Standing Status:   Future    Standing Expiration Date:   07/17/2019  . Ferritin    Standing Status:   Future    Standing Expiration Date:   07/17/2019    Return of visit: 4 weeks for Procrit and repeat CBC, iron TIBC ferritin.  Earlie Server, MD, PhD Hematology Oncology [facility] at [facility] Pager- [phone] 07/16/18

## 2018-07-23 ENCOUNTER — Other Ambulatory Visit: Payer: Self-pay | Admitting: Oncology

## 2018-08-17 ENCOUNTER — Other Ambulatory Visit: Payer: Self-pay

## 2018-08-17 ENCOUNTER — Inpatient Hospital Stay: Payer: Medicare Other | Attending: Oncology

## 2018-08-17 ENCOUNTER — Inpatient Hospital Stay: Payer: Medicare Other

## 2018-08-17 VITALS — BP 124/72 | HR 62 | Temp 97.0°F | Resp 20

## 2018-08-17 DIAGNOSIS — D631 Anemia in chronic kidney disease: Secondary | ICD-10-CM

## 2018-08-17 DIAGNOSIS — N185 Chronic kidney disease, stage 5: Secondary | ICD-10-CM | POA: Insufficient documentation

## 2018-08-17 DIAGNOSIS — F329 Major depressive disorder, single episode, unspecified: Secondary | ICD-10-CM | POA: Diagnosis not present

## 2018-08-17 DIAGNOSIS — D649 Anemia, unspecified: Secondary | ICD-10-CM | POA: Insufficient documentation

## 2018-08-17 DIAGNOSIS — Z7901 Long term (current) use of anticoagulants: Secondary | ICD-10-CM | POA: Diagnosis not present

## 2018-08-17 DIAGNOSIS — R5382 Chronic fatigue, unspecified: Secondary | ICD-10-CM | POA: Diagnosis not present

## 2018-08-17 DIAGNOSIS — Z87891 Personal history of nicotine dependence: Secondary | ICD-10-CM | POA: Insufficient documentation

## 2018-08-17 DIAGNOSIS — D509 Iron deficiency anemia, unspecified: Secondary | ICD-10-CM | POA: Diagnosis not present

## 2018-08-17 LAB — CBC
HEMATOCRIT: 25.8 % — AB (ref 35.0–47.0)
Hemoglobin: 8.1 g/dL — ABNORMAL LOW (ref 12.0–16.0)
MCH: 26.6 pg (ref 26.0–34.0)
MCHC: 31.5 g/dL — AB (ref 32.0–36.0)
MCV: 84.5 fL (ref 80.0–100.0)
Platelets: 240 10*3/uL (ref 150–440)
RBC: 3.05 MIL/uL — ABNORMAL LOW (ref 3.80–5.20)
RDW: 17.6 % — AB (ref 11.5–14.5)
WBC: 5.2 10*3/uL (ref 3.6–11.0)

## 2018-08-17 LAB — IRON AND TIBC
IRON: 35 ug/dL (ref 28–170)
Saturation Ratios: 13 % (ref 10.4–31.8)
TIBC: 276 ug/dL (ref 250–450)
UIBC: 241 ug/dL

## 2018-08-17 LAB — FERRITIN: FERRITIN: 207 ng/mL (ref 11–307)

## 2018-08-17 MED ORDER — IRON SUCROSE 20 MG/ML IV SOLN
200.0000 mg | Freq: Once | INTRAVENOUS | Status: AC
  Administered 2018-08-17: 200 mg via INTRAVENOUS
  Filled 2018-08-17: qty 10

## 2018-08-17 MED ORDER — SODIUM CHLORIDE 0.9 % IV SOLN
INTRAVENOUS | Status: DC
  Administered 2018-08-17: 14:00:00 via INTRAVENOUS
  Filled 2018-08-17: qty 250

## 2018-08-17 MED ORDER — SODIUM CHLORIDE 0.9 % IV SOLN: 200.0000 mg | Freq: Once | INTRAVENOUS | Status: DC

## 2018-08-20 ENCOUNTER — Inpatient Hospital Stay: Payer: Medicare Other

## 2018-08-20 ENCOUNTER — Encounter: Payer: Self-pay | Admitting: Oncology

## 2018-08-20 ENCOUNTER — Inpatient Hospital Stay (HOSPITAL_BASED_OUTPATIENT_CLINIC_OR_DEPARTMENT_OTHER): Payer: Medicare Other | Admitting: Oncology

## 2018-08-20 ENCOUNTER — Telehealth: Payer: Self-pay | Admitting: *Deleted

## 2018-08-20 ENCOUNTER — Other Ambulatory Visit: Payer: Self-pay | Admitting: *Deleted

## 2018-08-20 ENCOUNTER — Other Ambulatory Visit: Payer: Self-pay

## 2018-08-20 VITALS — BP 133/67 | HR 51 | Temp 95.9°F | Resp 16 | Wt 175.2 lb

## 2018-08-20 DIAGNOSIS — N185 Chronic kidney disease, stage 5: Principal | ICD-10-CM

## 2018-08-20 DIAGNOSIS — F329 Major depressive disorder, single episode, unspecified: Secondary | ICD-10-CM

## 2018-08-20 DIAGNOSIS — D631 Anemia in chronic kidney disease: Secondary | ICD-10-CM | POA: Diagnosis not present

## 2018-08-20 DIAGNOSIS — D509 Iron deficiency anemia, unspecified: Secondary | ICD-10-CM | POA: Diagnosis not present

## 2018-08-20 DIAGNOSIS — D649 Anemia, unspecified: Secondary | ICD-10-CM

## 2018-08-20 DIAGNOSIS — Z7901 Long term (current) use of anticoagulants: Secondary | ICD-10-CM

## 2018-08-20 DIAGNOSIS — Z87891 Personal history of nicotine dependence: Secondary | ICD-10-CM

## 2018-08-20 MED ORDER — EPOETIN ALFA 20000 UNIT/ML IJ SOLN
60000.0000 [IU] | Freq: Once | INTRAMUSCULAR | Status: DC
  Filled 2018-08-20: qty 3

## 2018-08-20 MED ORDER — EPOETIN ALFA 20000 UNIT/ML IJ SOLN
20000.0000 [IU] | Freq: Once | INTRAMUSCULAR | Status: DC
  Filled 2018-08-20: qty 1

## 2018-08-20 MED ORDER — EPOETIN ALFA 40000 UNIT/ML IJ SOLN
40000.0000 [IU] | Freq: Once | INTRAMUSCULAR | Status: DC
  Filled 2018-08-20: qty 1

## 2018-08-20 NOTE — Progress Notes (Signed)
Patient here today for follow up and procrit injection

## 2018-08-20 NOTE — Telephone Encounter (Signed)
Jeanette Romero claimed POA called asking for return call [phone] to discuss lab values and need for blood transfusion

## 2018-08-20 NOTE — Telephone Encounter (Signed)
Attempted to call pt's daughter, Domingo Sep, no answer and vm is full, unable to leave a message.

## 2018-08-20 NOTE — Telephone Encounter (Signed)
This is a Dr. Tasia Catchings patient.

## 2018-08-20 NOTE — Progress Notes (Signed)
Hematology/Oncology Follow up note [facility] [phone] [phone]   Patient Care Team: [name], MD as PCP - General (Internal Medicine)  REFERRING PROVIDER: [name], MD  REASON FOR VISIT Follow up for treatment of anemia of chronic kidney disease.  HISTORY OF PRESENTING ILLNESS:  Jeanette Romero is a  67 y.o.  female with PMH listed below who was referred to me for evaluation of labile INR. She has extensive cardiac comorbidities including history of aortic mechanic valve replacement, aneurysm of aorta, PAF, Coronary artery disease. She was on Amiodarone and also had drug induced hyperthyroidism.  She was sent to me for evaluation of labile INR.  Extensive review of her medical records from primary care physician's office showed  10/27/2017 INR 5.9,  10/24/2017 INR 4.9 10/12/2017 INR 1.2 10/07/2017 INR 6  10/04/2017 INR 8.1   08/11/2017 INR 7.5  05/24/2017 INR 3.4 04/26/2017 INR 3.5 03/15/2017 INR 3.4 01/31/2017 INR 2.7  08/29/2017 Folate 2.8,  B12 825 and the patient was started on folic acid supplementation.  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 Amiodarone was stopped. And patient was referred to endocrinologist for evaluation. She was found out to have Graves' disease and was started on prednisone. Last TSH that was done in the [facility] system showed a TSH of 4.8.  INTERVAL HISTORY Today patient presents for follow-up for management of anemia secondary to chronic kidney disease. She has been on Procrit 60,000 every 4 weeks.  Hemoglobin 8.1.  # Fatigue: reports worsening fatigue. Chronic onset, perisistent, no aggravating or improving factors, no associated symptoms.  #Patient also appears to be extremely unhappy and  upset today.  She complained" I need to go to work, my appointment is at 8:30AM, and I am not seen until 9AM". She looked at door when I talk to her. There was no eye interaction when I talk to her. No suicidal ideas.   Review of Systems  Constitutional: Positive for malaise/fatigue. Negative for chills, fever and weight loss.  HENT: Negative for congestion, ear discharge, ear pain, hearing loss, nosebleeds, sinus pain, sore throat and tinnitus.   Eyes: Negative for blurred vision, double vision, photophobia, pain, discharge and redness.  Respiratory: Positive for shortness of breath. Negative for cough, hemoptysis, sputum production and wheezing.   Cardiovascular: Negative for chest pain, palpitations, orthopnea, claudication and leg swelling.  Gastrointestinal: Negative for abdominal pain, blood in stool, constipation, diarrhea, heartburn, melena, nausea and vomiting.  Genitourinary: Negative for dysuria, flank pain, frequency and hematuria.  Musculoskeletal: Negative for back pain, myalgias and neck pain.  Skin: Negative for itching and rash.  Neurological: Negative for dizziness, tingling, tremors, speech change, focal weakness, weakness and headaches.  Endo/Heme/Allergies: Negative for environmental allergies. Does not bruise/bleed easily.  Psychiatric/Behavioral: Positive for depression. Negative for hallucinations and substance abuse. The patient is not nervous/anxious.     MEDICAL HISTORY:  Past Medical History:  Diagnosis Date  . Anemia in chronic kidney disease (CKD) 11/14/2017  . Anxiety   . Aortic valve regurgitation   . Chronic kidney disease   . Coronary artery disease   . Dizziness   . Edema   . GERD (gastroesophageal reflux disease)   . Gout   . Hypercholesterolemia   . Hypertension   . Shortness of breath dyspnea     SURGICAL  HISTORY: Past Surgical History:  Procedure Laterality Date  . ABDOMINAL HYSTERECTOMY    . AORTIC VALVE REPLACEMENT    . CARDIAC  CATHETERIZATION    . CARDIAC CATHETERIZATION N/A 05/07/2015   Procedure: Left Heart Cath;  Surgeon: [name], MD;  Location: [facility];  Service: Cardiovascular;  Laterality: N/A;  . CARDIAC VALVE REPLACEMENT    . ELECTROPHYSIOLOGIC STUDY N/A 05/05/2015   Procedure: Cardioversion;  Surgeon: [name], MD;  Location: [facility];  Service: Cardiovascular;  Laterality: N/A;  . REPLACEMENT TOTAL KNEE BILATERAL      SOCIAL HISTORY: Social History   Socioeconomic History  . Marital status: Married    Spouse name: Not on file  . Number of children: Not on file  . Years of education: Not on file  . Highest education level: Not on file  Occupational History  . Not on file  Social Needs  . Financial resource strain: Not on file  . Food insecurity:    Worry: Not on file    Inability: Not on file  . Transportation needs:    Medical: Not on file    Non-medical: Not on file  Tobacco Use  . Smoking status: Former Smoker    Packs/day: 0.50    Years: 20.00    Pack years: 10.00    Types: Cigarettes    Last attempt to quit: 2004    Years since quitting: 15.6  . Smokeless tobacco: Never Used  Substance and Sexual Activity  . Alcohol use: Yes    Alcohol/week: 0.0 standard drinks    Comment: rare  . Drug use: No  . Sexual activity: Not on file  Lifestyle  . Physical activity:    Days per week: Not on file    Minutes per session: Not on file  . Stress: Not on file  Relationships  . Social connections:    Talks on phone: Not on file    Gets together: Not on file    Attends religious service: Not on file    Active member of club or organization: Not on file    Attends meetings of clubs or organizations: Not on file    Relationship status: Not on file  . Intimate partner violence:    Fear of current or ex partner: Not on file    Emotionally abused: Not on file    Physically abused: Not on file    Forced sexual activity: Not on file  Other Topics Concern  . Not on  file  Social History Narrative  . Not on file    FAMILY HISTORY: Family History  Problem Relation Age of Onset  . Hypertension Mother   . Colon cancer Mother   . Prostate cancer Father   . Kidney cancer Brother   . Prostate cancer Brother   . Prostate cancer Brother   . Breast cancer Neg Hx     ALLERGIES:  is allergic to atorvastatin.  MEDICATIONS:  Current Outpatient Medications  Medication Sig Dispense Refill  . ALPRAZolam (XANAX) 0.25 MG tablet Take 0.25 mg by mouth 2 (two) times daily.    Marland Kitchen amLODipine (NORVASC) 10 MG tablet Take 5 mg by mouth daily.     Marland Kitchen aspirin EC 81 MG tablet Take 81 mg by mouth daily.    . benazepril (LOTENSIN) 40 MG tablet Take 40 mg by mouth daily.    . cloNIDine (CATAPRES) 0.3 MG tablet Take 0.3 mg 3 (three) times daily by mouth.  2  .  folic acid (FOLVITE) 1 MG tablet TAKE 1 TABLET(1 MG) BY MOUTH DAILY 30 tablet 0  . hydrALAZINE (APRESOLINE) 100 MG tablet Take 100 mg 3 (three) times daily by mouth.    . methimazole (TAPAZOLE) 5 MG tablet   11  . oxyCODONE (OXY IR/ROXICODONE) 5 MG immediate release tablet Take 5 mg by mouth every 4 (four) hours as needed.    . potassium chloride (K-DUR) 10 MEQ tablet Take by mouth.    . rosuvastatin (CRESTOR) 5 MG tablet Take 5 mg by mouth at bedtime.  2  . SODIUM BICARBONATE, ANTACID, PO Take 10 mg by mouth.    . warfarin (COUMADIN) 2 MG tablet Take 4 mg by mouth daily.      No current facility-administered medications for this visit.    Facility-Administered Medications Ordered in Other Visits  Medication Dose Route Frequency Provider Last Rate Last Dose  . epoetin alfa (EPOGEN,PROCRIT) injection 40,000 Units  40,000 Units Subcutaneous Once Earlie Server, MD       And  . epoetin alfa (EPOGEN,PROCRIT) injection 20,000 Units  20,000 Units Subcutaneous Once Earlie Server, MD         PHYSICAL EXAMINATION: ECOG PERFORMANCE STATUS: 1 - Symptomatic but completely ambulatory Vitals:   08/20/18 0902  BP: 133/67  Pulse: (!)  51  Resp: 16  Temp: (!) 95.9 F (35.5 C)   Filed Weights   08/20/18 0902  Weight: 175 lb 4 oz (79.5 kg)    Physical Exam  Constitutional: She is oriented to person, place, and time and well-developed, well-nourished, and in no distress. No distress.  HENT:  Head: Normocephalic and atraumatic.  Nose: Nose normal.  Mouth/Throat: Oropharynx is clear and moist. No oropharyngeal exudate.  Eyes: Pupils are equal, round, and reactive to light. EOM are normal. Left eye exhibits no discharge. No scleral icterus.  pallor  Neck: Normal range of motion. Neck supple. No JVD present.  Cardiovascular: Normal rate and regular rhythm.  Murmur heard. Pulmonary/Chest: Effort normal. No respiratory distress. She has no wheezes. She has no rales. She exhibits no tenderness.  Decreased breath sounds bilaterally.  No crackles  Abdominal: Soft. Bowel sounds are normal. She exhibits no distension and no mass. There is no tenderness. There is no rebound.  Musculoskeletal: Normal range of motion. She exhibits edema. She exhibits no tenderness.  Lymphadenopathy:    She has no cervical adenopathy.  Neurological: She is alert and oriented to person, place, and time. No cranial nerve deficit. She exhibits normal muscle tone. Gait normal. Coordination normal.  Skin: Skin is warm and dry. No rash noted. She is not diaphoretic. No erythema.  Psychiatric: Affect and judgment normal.  Patient appears to have a depressed mood at every visits.  Today she appears really unhappy and talks in a very rude way.        LABORATORY DATA:  I have reviewed the data as listed Lab Results  Component Value Date   WBC 5.2 08/17/2018   HGB 8.1 (L) 08/17/2018   HCT 25.8 (L) 08/17/2018   MCV 84.5 08/17/2018   PLT 240 08/17/2018   Recent Labs    10/31/17 0950 03/13/18 1040 04/01/18 0831 06/28/18 1841  NA 139 141 140 144  K 4.9 3.4* 3.0* 4.5  CL 107 108 105 110  CO2 24 23 26 24   GLUCOSE 130* 102* 99 92  BUN 68* 18 21*  30*  CREATININE 2.13* 2.18* 1.94* 2.08*  CALCIUM 9.3 9.2 9.6 10.1  GFRNONAA 23* 22* 26*  24*  GFRAA 27* 26* 30* 27*  PROT 6.4* 6.5  --  7.5  ALBUMIN 3.3* 3.2*  --  3.9  AST 35 14*  --  18  ALT 63* 9*  --  10  ALKPHOS 84 93  --  156*  BILITOT 0.7 0.6  --  0.5   Iron/TIBC/Ferritin/ %Sat    Component Value Date/Time   IRON 35 08/17/2018 1334   TIBC 276 08/17/2018 1334   FERRITIN 207 08/17/2018 1334   IRONPCTSAT 13 08/17/2018 1334    08/29/2017 Folate 2.8,  B12 825               01/02/2018 TSH 3.05  ASSESSMENT & PLAN:  1. Anemia in stage 5 chronic kidney disease, not on chronic dialysis ([place])   2. Normocytic anemia   3. Fatigue associated with anemia   4. Chronic anticoagulation   5. Iron deficiency anemia, unspecified iron deficiency anemia type    #Anemia of chronic kidney disease.  Hemoglobin stable at 8.1 She has been on Procrit and unfortunately hemoglobin has not respond well.  I suspect she has  underlying blood loss from GI tract while on blood thinner. I would still offer her procrit today and she declined.  # Iron deficiency: I have previously discussed with her about GI work up and she declined. She says she will discuss with pcp in the future.  Discussed about that additional IV infusion can be helpful in her case.  Patient says she have got letters saying that her IV iron infusion are not covered. Not interested in IV Iron infusion.   For her symptomatic anemia, I recommend 1 unit PRBC  which can improved her fatigue and also be a good iron source.    #History of folic acid deficiency: continuel folic acid daily. #Depression:   She appears to have low mood today. Not willing to discuss with me. She says" Get my blood transfusion ready!" " How long is blood transfusion going to take? . I explained to her that that she has to come sometimes this week and have type and screen done and get blood transfusion. So the time of blood transfusion depends a lot on when her blood will be ready and I can not give her an exact estimate today. She can discuss further with our scheduler if she has questions/prefenrence of the dates.  I also apologized to her that she has to wait a little longer today.  All questions were answered. The patient knows to call the clinic with any problems questions or concerns. Orders Placed This Encounter  Procedures  . Iron and TIBC    Standing Status:   Future    Standing Expiration Date:   08/21/2019  . Ferritin    Standing Status:   Future    Standing Expiration Date:   08/21/2019  . Sample to Blood Bank    Standing Status:   Future    Standing Expiration Date:   08/20/2019    Return of visit: 4 weeks for Procrit and repeat CBC, iron TIBC ferritin. Total face to face encounter time for this patient visit was 25 min. >50% of the time was  spent in counseling and coordination of care.  Earlie Server, MD, PhD Hematology Oncology [facility] Pager- [phone] 08/20/18

## 2018-08-21 ENCOUNTER — Telehealth: Payer: Self-pay | Admitting: Oncology

## 2018-08-21 NOTE — Telephone Encounter (Signed)
Patient's daughter Jeanette Romero called concerned about patients hemoglobin/ Hct level. I discussed doctors office visit note with her and she wants to know "what is the next step" since she has gotten iron infusions, blood transfusions and procrit shots and H&H continues to be decreased. Notified her that iron infusion and procrit injections were refused by patient and blood transfusion was recommended by Dr.Yu. I told her that I would forward her concern to Dr. Tasia Catchings and return her call with any additional information.

## 2018-08-22 ENCOUNTER — Other Ambulatory Visit: Payer: Self-pay | Admitting: Oncology

## 2018-08-23 ENCOUNTER — Ambulatory Visit: Payer: Medicare Other

## 2018-08-23 ENCOUNTER — Other Ambulatory Visit: Payer: Medicare Other

## 2018-08-24 ENCOUNTER — Inpatient Hospital Stay: Payer: Medicare Other

## 2018-08-24 ENCOUNTER — Other Ambulatory Visit: Payer: Self-pay | Admitting: Oncology

## 2018-08-24 DIAGNOSIS — D649 Anemia, unspecified: Secondary | ICD-10-CM

## 2018-08-24 DIAGNOSIS — N185 Chronic kidney disease, stage 5: Secondary | ICD-10-CM | POA: Diagnosis not present

## 2018-08-24 LAB — ABO/RH: ABO/RH(D): B POS

## 2018-08-24 LAB — PREPARE RBC (CROSSMATCH)

## 2018-08-24 MED ORDER — ACETAMINOPHEN 325 MG PO TABS
650.0000 mg | ORAL_TABLET | Freq: Once | ORAL | Status: AC
  Administered 2018-08-24: 650 mg via ORAL
  Filled 2018-08-24: qty 2

## 2018-08-24 MED ORDER — SODIUM CHLORIDE 0.9% IV SOLUTION
250.0000 mL | Freq: Once | INTRAVENOUS | Status: AC
  Administered 2018-08-24: 250 mL via INTRAVENOUS
  Filled 2018-08-24: qty 250

## 2018-08-24 MED ORDER — DIPHENHYDRAMINE HCL 25 MG PO CAPS
25.0000 mg | ORAL_CAPSULE | Freq: Once | ORAL | Status: AC
  Administered 2018-08-24: 25 mg via ORAL
  Filled 2018-08-24: qty 1

## 2018-08-25 LAB — BPAM RBC
BLOOD PRODUCT EXPIRATION DATE: 201909232359
ISSUE DATE / TIME: 201909131156
UNIT TYPE AND RH: 7300

## 2018-08-25 LAB — TYPE AND SCREEN
ABO/RH(D): B POS
ANTIBODY SCREEN: NEGATIVE
UNIT DIVISION: 0

## 2018-09-17 ENCOUNTER — Other Ambulatory Visit: Payer: Self-pay | Admitting: Oncology

## 2018-09-17 DIAGNOSIS — D631 Anemia in chronic kidney disease: Secondary | ICD-10-CM

## 2018-09-17 DIAGNOSIS — N184 Chronic kidney disease, stage 4 (severe): Principal | ICD-10-CM

## 2018-09-17 NOTE — Telephone Encounter (Signed)
Patient has no follow up appts with you. She was last seen 08/20/18 then got transfusion 9/17. Please advise

## 2018-09-18 NOTE — Addendum Note (Signed)
Addended by: Betti Cruz on: 09/18/2018 02:31 PM   Modules accepted: Orders

## 2018-09-24 ENCOUNTER — Other Ambulatory Visit: Payer: Self-pay

## 2018-09-24 ENCOUNTER — Encounter: Payer: Self-pay | Admitting: Oncology

## 2018-09-24 ENCOUNTER — Inpatient Hospital Stay: Payer: Medicare Other | Attending: Oncology

## 2018-09-24 ENCOUNTER — Inpatient Hospital Stay: Payer: Medicare Other

## 2018-09-24 ENCOUNTER — Inpatient Hospital Stay (HOSPITAL_BASED_OUTPATIENT_CLINIC_OR_DEPARTMENT_OTHER): Payer: Medicare Other | Admitting: Oncology

## 2018-09-24 VITALS — BP 120/66 | HR 67 | Temp 97.1°F | Resp 18 | Wt 167.2 lb

## 2018-09-24 DIAGNOSIS — R5383 Other fatigue: Secondary | ICD-10-CM

## 2018-09-24 DIAGNOSIS — D649 Anemia, unspecified: Secondary | ICD-10-CM

## 2018-09-24 DIAGNOSIS — Z952 Presence of prosthetic heart valve: Secondary | ICD-10-CM | POA: Insufficient documentation

## 2018-09-24 DIAGNOSIS — D631 Anemia in chronic kidney disease: Secondary | ICD-10-CM

## 2018-09-24 DIAGNOSIS — Z87891 Personal history of nicotine dependence: Secondary | ICD-10-CM | POA: Diagnosis not present

## 2018-09-24 DIAGNOSIS — N184 Chronic kidney disease, stage 4 (severe): Secondary | ICD-10-CM | POA: Insufficient documentation

## 2018-09-24 DIAGNOSIS — D509 Iron deficiency anemia, unspecified: Secondary | ICD-10-CM

## 2018-09-24 DIAGNOSIS — I2510 Atherosclerotic heart disease of native coronary artery without angina pectoris: Secondary | ICD-10-CM | POA: Diagnosis not present

## 2018-09-24 DIAGNOSIS — Z7901 Long term (current) use of anticoagulants: Secondary | ICD-10-CM | POA: Diagnosis not present

## 2018-09-24 DIAGNOSIS — N185 Chronic kidney disease, stage 5: Principal | ICD-10-CM

## 2018-09-24 LAB — CBC WITH DIFFERENTIAL/PLATELET
ABS IMMATURE GRANULOCYTES: 0.01 10*3/uL (ref 0.00–0.07)
BASOS ABS: 0 10*3/uL (ref 0.0–0.1)
Basophils Relative: 1 %
EOS PCT: 1 %
Eosinophils Absolute: 0 10*3/uL (ref 0.0–0.5)
HEMATOCRIT: 30.2 % — AB (ref 36.0–46.0)
HEMOGLOBIN: 9 g/dL — AB (ref 12.0–15.0)
IMMATURE GRANULOCYTES: 0 %
LYMPHS ABS: 0.8 10*3/uL (ref 0.7–4.0)
LYMPHS PCT: 14 %
MCH: 26.6 pg (ref 26.0–34.0)
MCHC: 29.8 g/dL — ABNORMAL LOW (ref 30.0–36.0)
MCV: 89.3 fL (ref 80.0–100.0)
Monocytes Absolute: 0.5 10*3/uL (ref 0.1–1.0)
Monocytes Relative: 9 %
NEUTROS ABS: 3.9 10*3/uL (ref 1.7–7.7)
NEUTROS PCT: 75 %
NRBC: 0 % (ref 0.0–0.2)
Platelets: 237 10*3/uL (ref 150–400)
RBC: 3.38 MIL/uL — AB (ref 3.87–5.11)
RDW: 15.7 % — ABNORMAL HIGH (ref 11.5–15.5)
WBC: 5.2 10*3/uL (ref 4.0–10.5)

## 2018-09-24 LAB — SAMPLE TO BLOOD BANK

## 2018-09-24 LAB — IRON AND TIBC
IRON: 44 ug/dL (ref 28–170)
Saturation Ratios: 14 % (ref 10.4–31.8)
TIBC: 307 ug/dL (ref 250–450)
UIBC: 263 ug/dL

## 2018-09-24 LAB — FERRITIN: FERRITIN: 294 ng/mL (ref 11–307)

## 2018-09-24 MED ORDER — EPOETIN ALFA 40000 UNIT/ML IJ SOLN
40000.0000 [IU] | Freq: Once | INTRAMUSCULAR | Status: AC
  Administered 2018-09-24: 40000 [IU] via SUBCUTANEOUS
  Filled 2018-09-24: qty 1

## 2018-09-24 MED ORDER — EPOETIN ALFA 20000 UNIT/ML IJ SOLN
20000.0000 [IU] | Freq: Once | INTRAMUSCULAR | Status: AC
  Administered 2018-09-24: 20000 [IU] via SUBCUTANEOUS
  Filled 2018-09-24: qty 1

## 2018-09-24 MED ORDER — EPOETIN ALFA 20000 UNIT/ML IJ SOLN: 60000.0000 [IU] | Freq: Once | INTRAMUSCULAR | Status: DC

## 2018-09-24 NOTE — Progress Notes (Addendum)
Hematology/Oncology Follow up note [facility] Telephone:(336) [phone] Fax:(336) [phone]   Patient Care Team: [name], MD as PCP - General (Internal Medicine)  REFERRING PROVIDER: [name], MD  REASON FOR VISIT Follow up for treatment of anemia of chronic kidney disease.  HISTORY OF PRESENTING ILLNESS:  Jeanette Romero is a  67 y.o.  female with PMH listed below who was referred to me for evaluation of labile INR. She has extensive cardiac comorbidities including history of aortic mechanic valve replacement, aneurysm of aorta, PAF, Coronary artery disease. She was on Amiodarone and also had drug induced hyperthyroidism.  She was sent to me for evaluation of labile INR.  Extensive review of her medical records from primary care physician's office showed  10/27/2017 INR 5.9,  10/24/2017 INR 4.9 10/12/2017 INR 1.2 10/07/2017 INR 6  10/04/2017 INR 8.1   08/11/2017 INR 7.5  05/24/2017 INR 3.4 04/26/2017 INR 3.5 03/15/2017 INR 3.4 01/31/2017 INR 2.7  08/29/2017 Folate 2.8,  B12 825 and the patient was started on folic acid supplementation.  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 Amiodarone was stopped. And patient was referred to endocrinologist for evaluation. She was found out to have Graves' disease and was started on prednisone. Last TSH that was done in the [place] system showed a TSH of 4.8.  INTERVAL HISTORY Today patient presents for follow-up for management of anemia secondary to chronic kidney disease.  Patient has been on Procrit 60,000 every 4 weeks.  Hemoglobin 9.  Status post 1 PRBC transfusion of the last visit.  # Fatigue: Reports fatigue has been better since transfusion. #She has extensive cardiac history of aortic mechanic valve  replacement, aneurysm of aorta, PAF, Coronary artery disease.  Reports being diagnosed with atrial fibrillation.  She has been on chronic anticoagulation. Denies any acute bleeding events.  Denies any melena, blood in the stool. #Patient is accompanied by her family member today.  She always had a flat affect, minimal eye interaction when I talked to her.  Feeling depressed, previously discussed seeing psychiatrist and she is not interested.  Denies any suicidal ideas.  Review of Systems  Constitutional: Positive for malaise/fatigue. Negative for chills, fever and weight loss.  HENT: Negative for congestion, ear discharge, ear pain, hearing loss, nosebleeds, sinus pain, sore throat and tinnitus.   Eyes: Negative for blurred vision, double vision, photophobia, pain, discharge and redness.  Respiratory: Positive for shortness of breath. Negative for cough, hemoptysis, sputum production and wheezing.   Cardiovascular: Negative for chest pain, palpitations, orthopnea, claudication and leg swelling.  Gastrointestinal: Negative for abdominal pain, blood in stool, constipation, diarrhea, heartburn, melena, nausea and vomiting.  Genitourinary: Negative for dysuria, flank pain, frequency and hematuria.  Musculoskeletal: Negative for back pain, myalgias and neck pain.  Skin: Negative for itching and rash.  Neurological: Negative for dizziness, tingling, tremors, speech change, focal weakness, weakness and headaches.  Endo/Heme/Allergies: Negative for environmental allergies. Does not bruise/bleed easily.  Psychiatric/Behavioral: Positive for depression. Negative for hallucinations and substance abuse. The patient is not nervous/anxious.     MEDICAL HISTORY:  Past Medical History:  Diagnosis Date  . Anemia in chronic kidney disease (CKD) 11/14/2017  . Anxiety   . Aortic valve regurgitation   . Chronic kidney disease   . Coronary artery disease   . Dizziness   .  Edema   . GERD (gastroesophageal  reflux disease)   . Gout   . Hypercholesterolemia   . Hypertension   . Shortness of breath dyspnea     SURGICAL HISTORY: Past Surgical History:  Procedure Laterality Date  . ABDOMINAL HYSTERECTOMY    . AORTIC VALVE REPLACEMENT    . CARDIAC CATHETERIZATION    . CARDIAC CATHETERIZATION N/A 05/07/2015   Procedure: Left Heart Cath;  Surgeon: [name], MD;  Location: [facility];  Service: Cardiovascular;  Laterality: N/A;  . CARDIAC VALVE REPLACEMENT    . ELECTROPHYSIOLOGIC STUDY N/A 05/05/2015   Procedure: Cardioversion;  Surgeon: [name], MD;  Location: [facility];  Service: Cardiovascular;  Laterality: N/A;  . REPLACEMENT TOTAL KNEE BILATERAL      SOCIAL HISTORY: Social History   Socioeconomic History  . Marital status: Married    Spouse name: Not on file  . Number of children: Not on file  . Years of education: Not on file  . Highest education level: Not on file  Occupational History  . Not on file  Social Needs  . Financial resource strain: Not on file  . Food insecurity:    Worry: Not on file    Inability: Not on file  . Transportation needs:    Medical: Not on file    Non-medical: Not on file  Tobacco Use  . Smoking status: Former Smoker    Packs/day: 0.50    Years: 20.00    Pack years: 10.00    Types: Cigarettes    Last attempt to quit: 2004    Years since quitting: 15.7  . Smokeless tobacco: Never Used  Substance and Sexual Activity  . Alcohol use: Yes    Alcohol/week: 0.0 standard drinks    Comment: rare  . Drug use: No  . Sexual activity: Not on file  Lifestyle  . Physical activity:    Days per week: Not on file    Minutes per session: Not on file  . Stress: Not on file  Relationships  . Social connections:    Talks on phone: Not on file    Gets together: Not on file    Attends religious service: Not on file    Active member of club or organization: Not on file    Attends meetings of clubs or organizations: Not on file     Relationship status: Not on file  . Intimate partner violence:    Fear of current or ex partner: Not on file    Emotionally abused: Not on file    Physically abused: Not on file    Forced sexual activity: Not on file  Other Topics Concern  . Not on file  Social History Narrative  . Not on file    FAMILY HISTORY: Family History  Problem Relation Age of Onset  . Hypertension Mother   . Colon cancer Mother   . Prostate cancer Father   . Kidney cancer Brother   . Prostate cancer Brother   . Prostate cancer Brother   . Breast cancer Neg Hx     ALLERGIES:  is allergic to atorvastatin.  MEDICATIONS:  Current Outpatient Medications  Medication Sig Dispense Refill  . ALPRAZolam (XANAX) 0.25 MG tablet Take 0.25 mg by mouth 2 (two) times daily.    Marland Kitchen amLODipine (NORVASC) 10 MG tablet Take 5 mg by mouth daily.     Marland Kitchen aspirin EC 81 MG tablet Take 81 mg by mouth daily.    Marland Kitchen  benazepril (LOTENSIN) 40 MG tablet Take 40 mg by mouth daily.    . cloNIDine (CATAPRES) 0.3 MG tablet Take 0.3 mg 3 (three) times daily by mouth.  2  . folic acid (FOLVITE) 1 MG tablet TAKE 1 TABLET(1 MG) BY MOUTH DAILY 30 tablet 0  . furosemide (LASIX) 40 MG tablet Take 40 mg by mouth daily.    . hydrALAZINE (APRESOLINE) 100 MG tablet Take 100 mg 3 (three) times daily by mouth.    . methimazole (TAPAZOLE) 5 MG tablet   11  . potassium chloride (K-DUR) 10 MEQ tablet Take by mouth.    . rosuvastatin (CRESTOR) 5 MG tablet Take 5 mg by mouth at bedtime.  2  . SODIUM BICARBONATE, ANTACID, PO Take 10 mg by mouth.    . warfarin (COUMADIN) 2 MG tablet Take 4 mg by mouth daily.     Marland Kitchen oxyCODONE (OXY IR/ROXICODONE) 5 MG immediate release tablet Take 5 mg by mouth every 4 (four) hours as needed.     No current facility-administered medications for this visit.      PHYSICAL EXAMINATION: ECOG PERFORMANCE STATUS: 1 - Symptomatic but completely ambulatory Vitals:   09/24/18 1402  BP: 120/66  Pulse: 67  Resp: 18  Temp: (!)  97.1 F (36.2 C)   Filed Weights   09/24/18 1402  Weight: 167 lb 3.2 oz (75.8 kg)    Physical Exam  Constitutional: She is oriented to person, place, and time. No distress.  HENT:  Head: Normocephalic and atraumatic.  Nose: Nose normal.  Mouth/Throat: Oropharynx is clear and moist. No oropharyngeal exudate.  Eyes: Pupils are equal, round, and reactive to light. EOM are normal. Left eye exhibits no discharge. No scleral icterus.  pallor  Neck: Normal range of motion. Neck supple. No JVD present.  Cardiovascular: Normal rate and regular rhythm.  Murmur heard. Pulmonary/Chest: Effort normal. No respiratory distress. She has no wheezes. She has no rales. She exhibits no tenderness.  Decreased breath sounds bilaterally.  No crackles  Abdominal: Soft. Bowel sounds are normal. She exhibits no distension and no mass. There is no tenderness. There is no rebound.  Musculoskeletal: Normal range of motion. She exhibits edema. She exhibits no tenderness.  Lymphadenopathy:    She has no cervical adenopathy.  Neurological: She is alert and oriented to person, place, and time. No cranial nerve deficit. She exhibits normal muscle tone. Gait normal. Coordination normal.  Skin: Skin is warm and dry. No rash noted. She is not diaphoretic. No erythema.  Psychiatric:  Patient appears to have a depressed mood at every visits.       LABORATORY DATA:  I have reviewed the data as listed Lab Results  Component Value Date   WBC 5.2 09/24/2018   HGB 9.0 (L) 09/24/2018   HCT 30.2 (L) 09/24/2018   MCV 89.3 09/24/2018   PLT 237 09/24/2018   Recent Labs    10/31/17 0950 03/13/18 1040 04/01/18 0831 06/28/18 1841  NA 139 141 140 144  K 4.9 3.4* 3.0* 4.5  CL 107 108 105 110  CO2 24 23 26 24   GLUCOSE 130* 102* 99 92  BUN 68* 18 21* 30*  CREATININE 2.13* 2.18* 1.94* 2.08*  CALCIUM 9.3 9.2 9.6 10.1  GFRNONAA 23* 22* 26* 24*  GFRAA 27* 26* 30* 27*  PROT 6.4* 6.5  --  7.5  ALBUMIN 3.3* 3.2*  --   3.9  AST 35 14*  --  18  ALT 63* 9*  --  10  ALKPHOS 84 93  --  156*  BILITOT 0.7 0.6  --  0.5   Iron/TIBC/Ferritin/ %Sat    Component Value Date/Time   IRON 35 08/17/2018 1334   TIBC 276 08/17/2018 1334   FERRITIN 207 08/17/2018 1334   IRONPCTSAT 13 08/17/2018 1334    08/29/2017 Folate 2.8,  B12 825               01/02/2018 TSH 3.05                                                                                                                                                                                                                                                                                                                                                                                                                                                                                                                 ASSESSMENT & PLAN:  1. Anemia in stage 4 chronic kidney disease ([place])   2. Fatigue associated with anemia   3. Chronic anticoagulation    #Anemia  of chronic kidney disease.  She has been on Procrit 60,000 unit every 4 weeks.  Hemoglobin has not improved much.  Status post 1 unit of PRBC transfusion during interval. We rechecked her iron panel today. Serum iron 44, TIBC 307, saturation ratio 14  Ferritin 294  We have previously discussed about that she needs a GI work-up to rule out any chronic GI blood loss which may contribute to her persistent anemia.  She informs me that she is seeing Dr. Alice Reichert next week. Iron panel reviewed.  Ferritin improved after blood transfusion still less than 300.  We will arrange patient to receive IV Venofer 200 mg x 2. #History of folic acid deficiency: Continue folic acid daily.  All questions were answered. The patient knows to call the clinic with any problems questions or concerns.   Return of visit: 4 weeks for Procrit and repeat CBC, iron TIBC ferritin. Total face to face encounter time for this patient visit was 25 min. >50% of  the time was  spent in counseling and coordination of care.  Earlie Server, MD, PhD Hematology Oncology [facility] at [facility] Pager- [phone] 09/24/18

## 2018-09-24 NOTE — Progress Notes (Signed)
Patient here for follow up. Patient has new dx of A.Fib, she is being followed by [facility] @ [facility].

## 2018-09-26 ENCOUNTER — Telehealth: Payer: Self-pay | Admitting: *Deleted

## 2018-09-26 HISTORY — PX: TEE WITH CARDIOVERSION: SHX5442

## 2018-09-26 NOTE — Telephone Encounter (Signed)
I called patient to see about getting her scheduled. Per Almyra Free 09/24/18 scheduling message to schedule IV Venofer x2. Patient stated that she has doctor's appts for the next 3 weeks and that  she would call the office back at a later date and that she doesn't want anything scheduled as of right now.

## 2018-09-27 ENCOUNTER — Telehealth: Payer: Self-pay | Admitting: *Deleted

## 2018-09-27 NOTE — Telephone Encounter (Signed)
-----   Message from Earlie Server, MD sent at 09/26/2018  6:34 PM EDT ----- Regarding: RE: Patient refused Ok thank you .   ----- Message ----- From: Maryann Conners, NT Sent: 09/26/2018   5:03 PM EDT To: Vernetta Honey, CMA, Earlie Server, MD Subject: Patient refused                                FYI... I called patient to see about getting her scheduled. Per Almyra Free 09/24/18 scheduling message to schedule IV Venofer x2. Patient stated that she has doctor's appts for the next 3 weeks and that she would call the office back at a later date and that she doesn't want anything scheduled as of right now.  Thanks, Ellison Hughs.

## 2018-10-05 ENCOUNTER — Inpatient Hospital Stay: Payer: Medicare Other

## 2018-10-05 VITALS — BP 121/68 | HR 50 | Temp 96.0°F | Resp 20

## 2018-10-05 DIAGNOSIS — N184 Chronic kidney disease, stage 4 (severe): Secondary | ICD-10-CM | POA: Diagnosis not present

## 2018-10-05 DIAGNOSIS — D631 Anemia in chronic kidney disease: Secondary | ICD-10-CM

## 2018-10-05 DIAGNOSIS — N185 Chronic kidney disease, stage 5: Principal | ICD-10-CM

## 2018-10-05 MED ORDER — SODIUM CHLORIDE 0.9 % IV SOLN
INTRAVENOUS | Status: DC
  Administered 2018-10-05: 14:00:00 via INTRAVENOUS
  Filled 2018-10-05: qty 250

## 2018-10-05 MED ORDER — SODIUM CHLORIDE 0.9 % IV SOLN: 200.0000 mg | Freq: Once | INTRAVENOUS | Status: DC

## 2018-10-05 MED ORDER — IRON SUCROSE 20 MG/ML IV SOLN
200.0000 mg | Freq: Once | INTRAVENOUS | Status: AC
  Administered 2018-10-05: 200 mg via INTRAVENOUS
  Filled 2018-10-05: qty 10

## 2018-10-12 ENCOUNTER — Inpatient Hospital Stay: Payer: Medicare Other | Attending: Oncology

## 2018-10-12 DIAGNOSIS — N185 Chronic kidney disease, stage 5: Secondary | ICD-10-CM | POA: Insufficient documentation

## 2018-10-12 DIAGNOSIS — Z79899 Other long term (current) drug therapy: Secondary | ICD-10-CM | POA: Insufficient documentation

## 2018-10-12 DIAGNOSIS — Z87891 Personal history of nicotine dependence: Secondary | ICD-10-CM | POA: Insufficient documentation

## 2018-10-12 DIAGNOSIS — Z7901 Long term (current) use of anticoagulants: Secondary | ICD-10-CM | POA: Insufficient documentation

## 2018-10-12 DIAGNOSIS — D631 Anemia in chronic kidney disease: Secondary | ICD-10-CM | POA: Insufficient documentation

## 2018-10-12 NOTE — Progress Notes (Signed)
After 4 unsuccessful IV attempts (by two different nurses) pt denies any more attempts, pt states she will call to reschedule. MD aware.

## 2018-10-17 ENCOUNTER — Other Ambulatory Visit: Payer: Self-pay | Admitting: Oncology

## 2018-10-19 ENCOUNTER — Other Ambulatory Visit: Payer: Self-pay

## 2018-10-19 DIAGNOSIS — D631 Anemia in chronic kidney disease: Secondary | ICD-10-CM

## 2018-10-19 DIAGNOSIS — N185 Chronic kidney disease, stage 5: Principal | ICD-10-CM

## 2018-10-22 ENCOUNTER — Inpatient Hospital Stay: Payer: Medicare Other

## 2018-10-22 ENCOUNTER — Encounter: Payer: Self-pay | Admitting: Oncology

## 2018-10-22 ENCOUNTER — Other Ambulatory Visit: Payer: Self-pay

## 2018-10-22 ENCOUNTER — Inpatient Hospital Stay (HOSPITAL_BASED_OUTPATIENT_CLINIC_OR_DEPARTMENT_OTHER): Payer: Medicare Other | Admitting: Oncology

## 2018-10-22 VITALS — BP 131/75 | HR 62 | Temp 96.2°F | Resp 18 | Wt 174.3 lb

## 2018-10-22 DIAGNOSIS — N185 Chronic kidney disease, stage 5: Principal | ICD-10-CM

## 2018-10-22 DIAGNOSIS — D631 Anemia in chronic kidney disease: Secondary | ICD-10-CM

## 2018-10-22 DIAGNOSIS — Z7901 Long term (current) use of anticoagulants: Secondary | ICD-10-CM | POA: Diagnosis not present

## 2018-10-22 DIAGNOSIS — E538 Deficiency of other specified B group vitamins: Secondary | ICD-10-CM | POA: Diagnosis not present

## 2018-10-22 DIAGNOSIS — Z79899 Other long term (current) drug therapy: Secondary | ICD-10-CM | POA: Diagnosis not present

## 2018-10-22 DIAGNOSIS — Z87891 Personal history of nicotine dependence: Secondary | ICD-10-CM | POA: Diagnosis not present

## 2018-10-22 DIAGNOSIS — N184 Chronic kidney disease, stage 4 (severe): Secondary | ICD-10-CM

## 2018-10-22 DIAGNOSIS — D649 Anemia, unspecified: Secondary | ICD-10-CM

## 2018-10-22 DIAGNOSIS — R5382 Chronic fatigue, unspecified: Secondary | ICD-10-CM | POA: Diagnosis not present

## 2018-10-22 LAB — RETICULOCYTES
IMMATURE RETIC FRACT: 10.4 % (ref 2.3–15.9)
RBC.: 3.18 MIL/uL — ABNORMAL LOW (ref 3.87–5.11)
RETIC CT PCT: 1.8 % (ref 0.4–3.1)
Retic Count, Absolute: 57.9 10*3/uL (ref 19.0–186.0)

## 2018-10-22 LAB — CBC
HEMATOCRIT: 29 % — AB (ref 36.0–46.0)
HEMOGLOBIN: 8.9 g/dL — AB (ref 12.0–15.0)
MCH: 28 pg (ref 26.0–34.0)
MCHC: 30.7 g/dL (ref 30.0–36.0)
MCV: 91.2 fL (ref 80.0–100.0)
Platelets: 194 10*3/uL (ref 150–400)
RBC: 3.18 MIL/uL — ABNORMAL LOW (ref 3.87–5.11)
RDW: 14.9 % (ref 11.5–15.5)
WBC: 4.7 10*3/uL (ref 4.0–10.5)
nRBC: 0 % (ref 0.0–0.2)

## 2018-10-22 LAB — IRON AND TIBC
Iron: 48 ug/dL (ref 28–170)
Saturation Ratios: 17 % (ref 10.4–31.8)
TIBC: 287 ug/dL (ref 250–450)
UIBC: 239 ug/dL

## 2018-10-22 LAB — FERRITIN: FERRITIN: 296 ng/mL (ref 11–307)

## 2018-10-22 MED ORDER — EPOETIN ALFA 20000 UNIT/ML IJ SOLN: 60000.0000 [IU] | Freq: Once | INTRAMUSCULAR | Status: DC

## 2018-10-22 MED ORDER — EPOETIN ALFA 20000 UNIT/ML IJ SOLN
20000.0000 [IU] | Freq: Once | INTRAMUSCULAR | Status: AC
  Administered 2018-10-22: 20000 [IU] via SUBCUTANEOUS
  Filled 2018-10-22: qty 1

## 2018-10-22 MED ORDER — EPOETIN ALFA 40000 UNIT/ML IJ SOLN
40000.0000 [IU] | Freq: Once | INTRAMUSCULAR | Status: AC
  Administered 2018-10-22: 40000 [IU] via SUBCUTANEOUS
  Filled 2018-10-22: qty 1

## 2018-10-22 NOTE — Progress Notes (Signed)
Hematology/Oncology Follow up note [facility] Telephone:(336) [phone] Fax:(336) [phone]   Patient Care Team: [name], MD as PCP - General (Internal Medicine)  REFERRING PROVIDER: [name], MD  REASON FOR VISIT Follow up for treatment of anemia of chronic kidney disease.  HISTORY OF PRESENTING ILLNESS:  Jeanette Romero is a  67 y.o.  female with PMH listed below who was referred to me for evaluation of labile INR. She has extensive cardiac comorbidities including history of aortic mechanic valve replacement, aneurysm of aorta, PAF, Coronary artery disease. She was on Amiodarone and also had drug induced hyperthyroidism.  She was sent to me for evaluation of labile INR.  Extensive review of her medical records from primary care physician's office showed  10/27/2017 INR 5.9,  10/24/2017 INR 4.9 10/12/2017 INR 1.2 10/07/2017 INR 6  10/04/2017 INR 8.1   08/11/2017 INR 7.5  05/24/2017 INR 3.4 04/26/2017 INR 3.5 03/15/2017 INR 3.4 01/31/2017 INR 2.7  08/29/2017 Folate 2.8,  B12 825 and the patient was started on folic acid supplementation.  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 Amiodarone was stopped. And patient was referred to endocrinologist for evaluation. She was found out to have Graves' disease and was started on prednisone. Last TSH that was done in the [place] system showed a TSH of 4.8.  INTERVAL HISTORY Today patient presents for follow-up for management of anemia of CKD.  Has been on erythropoietin replacement treatment monthly.   She was scheduled to get IV Venofer. She was able to receive one dose. IV access was not able to be established and patient cancel rest of her appointments.  Fatigue is chronic, at baseline.   Reports that  recently she has atrial fibrillation, which needs ablation.  She also has established care with GI and plan to have colonoscopy done.  Denies any acute bleeding events.  Denies any melena, blood in the stool.   Review of Systems  Constitutional: Positive for malaise/fatigue. Negative for chills, fever and weight loss.  HENT: Negative for congestion, ear discharge, ear pain, hearing loss, nosebleeds, sinus pain, sore throat and tinnitus.   Eyes: Negative for blurred vision, double vision, photophobia, pain, discharge and redness.  Respiratory: Positive for shortness of breath. Negative for cough, hemoptysis, sputum production and wheezing.   Cardiovascular: Negative for chest pain, palpitations, orthopnea, claudication and leg swelling.  Gastrointestinal: Negative for abdominal pain, blood in stool, constipation, diarrhea, heartburn, melena, nausea and vomiting.  Genitourinary: Negative for dysuria, flank pain, frequency and hematuria.  Musculoskeletal: Negative for back pain, myalgias and neck pain.  Skin: Negative for itching and rash.  Neurological: Negative for dizziness, tingling, tremors, speech change, focal weakness, weakness and headaches.  Endo/Heme/Allergies: Negative for environmental allergies. Does not bruise/bleed easily.  Psychiatric/Behavioral: Positive for depression. Negative for hallucinations and substance abuse. The patient is not nervous/anxious.     MEDICAL HISTORY:  Past Medical History:  Diagnosis Date  . Anemia in chronic kidney disease (CKD) 11/14/2017  . Anxiety   . Aortic valve regurgitation   . Chronic kidney disease   . Coronary artery disease   . Dizziness   . Edema   . GERD (gastroesophageal reflux disease)   . Gout   . Hypercholesterolemia   . Hypertension   . Shortness of breath dyspnea     SURGICAL HISTORY:  Past Surgical History:  Procedure Laterality Date  . ABDOMINAL HYSTERECTOMY    . AORTIC VALVE REPLACEMENT    . CARDIAC CATHETERIZATION     . CARDIAC CATHETERIZATION N/A 05/07/2015   Procedure: Left Heart Cath;  Surgeon: [name], MD;  Location: [facility];  Service: Cardiovascular;  Laterality: N/A;  . CARDIAC VALVE REPLACEMENT    . ELECTROPHYSIOLOGIC STUDY N/A 05/05/2015   Procedure: Cardioversion;  Surgeon: [name], MD;  Location: [facility] ORS;  Service: Cardiovascular;  Laterality: N/A;  . REPLACEMENT TOTAL KNEE BILATERAL      SOCIAL HISTORY: Social History   Socioeconomic History  . Marital status: Married    Spouse name: Not on file  . Number of children: Not on file  . Years of education: Not on file  . Highest education level: Not on file  Occupational History  . Not on file  Social Needs  . Financial resource strain: Not on file  . Food insecurity:    Worry: Not on file    Inability: Not on file  . Transportation needs:    Medical: Not on file    Non-medical: Not on file  Tobacco Use  . Smoking status: Former Smoker    Packs/day: 0.50    Years: 20.00    Pack years: 10.00    Types: Cigarettes    Last attempt to quit: 2004    Years since quitting: 15.8  . Smokeless tobacco: Never Used  Substance and Sexual Activity  . Alcohol use: Yes    Alcohol/week: 0.0 standard drinks    Comment: rare  . Drug use: No  . Sexual activity: Not on file  Lifestyle  . Physical activity:    Days per week: Not on file    Minutes per session: Not on file  . Stress: Not on file  Relationships  . Social connections:    Talks on phone: Not on file    Gets together: Not on file    Attends religious service: Not on file    Active member of club or organization: Not on file    Attends meetings of clubs or organizations: Not on file    Relationship status: Not on file  . Intimate partner violence:    Fear of current or ex partner: Not on file    Emotionally abused: Not on file    Physically abused: Not on file    Forced sexual activity: Not on file  Other Topics Concern  . Not on file  Social  History Narrative  . Not on file    FAMILY HISTORY: Family History  Problem Relation Age of Onset  . Hypertension Mother   . Colon cancer Mother   . Prostate cancer Father   . Kidney cancer Brother   . Prostate cancer Brother   . Prostate cancer Brother   . Breast cancer Neg Hx     ALLERGIES:  is allergic to atorvastatin.  MEDICATIONS:  Current Outpatient Medications  Medication Sig Dispense Refill  . ALPRAZolam (XANAX) 0.25 MG tablet Take 0.25 mg by mouth 2 (two) times daily.    Marland Kitchen amLODipine (NORVASC) 10 MG tablet Take 5 mg by mouth daily.     Marland Kitchen aspirin EC 81 MG tablet Take 81 mg by mouth daily.    . benazepril (LOTENSIN) 40 MG tablet Take 40 mg by mouth daily.    . cloNIDine (CATAPRES) 0.3 MG tablet Take 0.3 mg 3 (three) times daily by mouth.  2  .  folic acid (FOLVITE) 1 MG tablet TAKE 1 TABLET(1 MG) BY MOUTH DAILY 30 tablet 0  . furosemide (LASIX) 40 MG tablet Take 40 mg by mouth daily.    . hydrALAZINE (APRESOLINE) 100 MG tablet Take 100 mg 3 (three) times daily by mouth.    . methimazole (TAPAZOLE) 5 MG tablet   11  . potassium chloride (K-DUR) 10 MEQ tablet Take by mouth.    . rosuvastatin (CRESTOR) 5 MG tablet Take 5 mg by mouth at bedtime.  2  . SODIUM BICARBONATE, ANTACID, PO Take 10 mg by mouth.    . warfarin (COUMADIN) 2 MG tablet Take 4 mg by mouth daily.     Marland Kitchen oxyCODONE (OXY IR/ROXICODONE) 5 MG immediate release tablet Take 5 mg by mouth every 4 (four) hours as needed.     No current facility-administered medications for this visit.      PHYSICAL EXAMINATION: ECOG PERFORMANCE STATUS: 1 - Symptomatic but completely ambulatory Vitals:   10/22/18 1404  BP: 131/75  Pulse: 62  Resp: 18  Temp: (!) 96.2 F (35.7 C)   Filed Weights   10/22/18 1404  Weight: 174 lb 4.8 oz (79.1 kg)    Physical Exam  Constitutional: She is oriented to person, place, and time. No distress.  HENT:  Head: Normocephalic and atraumatic.  Nose: Nose normal.  Mouth/Throat:  Oropharynx is clear and moist. No oropharyngeal exudate.  Eyes: Pupils are equal, round, and reactive to light. EOM are normal. Left eye exhibits no discharge. No scleral icterus.  pallor  Neck: Normal range of motion. Neck supple. No JVD present.  Cardiovascular: Normal rate.  Murmur heard. Irregular beats  Pulmonary/Chest: Effort normal. No respiratory distress. She has no wheezes. She has no rales. She exhibits no tenderness.  Decreased breath sounds bilaterally.  No crackles  Abdominal: Soft. Bowel sounds are normal. She exhibits no distension and no mass. There is no tenderness. There is no rebound.  Musculoskeletal: Normal range of motion. She exhibits no tenderness.  Trace edema.  Lymphadenopathy:    She has no cervical adenopathy.  Neurological: She is alert and oriented to person, place, and time. No cranial nerve deficit. She exhibits normal muscle tone. Gait normal. Coordination normal.  Skin: Skin is warm and dry. No rash noted. She is not diaphoretic. No erythema.  Psychiatric: Mood normal.     LABORATORY DATA:  I have reviewed the data as listed Lab Results  Component Value Date   WBC 4.7 10/22/2018   HGB 8.9 (L) 10/22/2018   HCT 29.0 (L) 10/22/2018   MCV 91.2 10/22/2018   PLT 194 10/22/2018   Recent Labs    10/31/17 0950 03/13/18 1040 04/01/18 0831 06/28/18 1841  NA 139 141 140 144  K 4.9 3.4* 3.0* 4.5  CL 107 108 105 110  CO2 24 23 26 24   GLUCOSE 130* 102* 99 92  BUN 68* 18 21* 30*  CREATININE 2.13* 2.18* 1.94* 2.08*  CALCIUM 9.3 9.2 9.6 10.1  GFRNONAA 23* 22* 26* 24*  GFRAA 27* 26* 30* 27*  PROT 6.4* 6.5  --  7.5  ALBUMIN 3.3* 3.2*  --  3.9  AST 35 14*  --  18  ALT 63* 9*  --  10  ALKPHOS 84 93  --  156*  BILITOT 0.7 0.6  --  0.5   Iron/TIBC/Ferritin/ %Sat    Component Value Date/Time   IRON 48 10/22/2018 1350   TIBC 287 10/22/2018 1350   FERRITIN 296 10/22/2018 1350  IRONPCTSAT 17 10/22/2018 1350    08/29/2017 Folate 2.8,  B12 825                01/02/2018 TSH 3.05                                                                                                                                                                                                                                                                                                                                                                                                                                                                                                                 ASSESSMENT & PLAN:  1. Anemia in stage 5 chronic kidney disease, not on chronic dialysis ([place])   2. Fatigue associated with anemia   3. Chronic anticoagulation    #Anemia of chronic kidney disease.  Hemoglobin remains stable,8.9.  Proceed with Procrit 60,000 today.  Continue follow up with GI for colonoscopy.   #History of folic acid deficiency: Continue folic acid daily.  All questions were answered. The patient  knows to call the clinic with any problems questions or concerns.   Return of visit: 4 weeks for Procrit  Total face to face encounter time for this patient visit was 15 min. >50% of the time was  spent in counseling and coordination of care.  Earlie Server, MD, PhD Hematology Oncology [facility] at [facility] Pager- [phone] 10/22/18

## 2018-10-22 NOTE — Progress Notes (Signed)
Patient here for follow up. Patient concerned about weight gain. She states she was around 166 lbs last week at another doctor's office. Reweighted x3 with our scale.

## 2018-11-08 IMAGING — MG MM DIGITAL SCREENING BILAT W/ CAD
4 series · 4 of 4 positions shown · non-contrast
Comparison: Previous exam(s).

CLINICAL DATA: Screening.

EXAM:
DIGITAL SCREENING BILATERAL MAMMOGRAM WITH CAD

[R MLO]
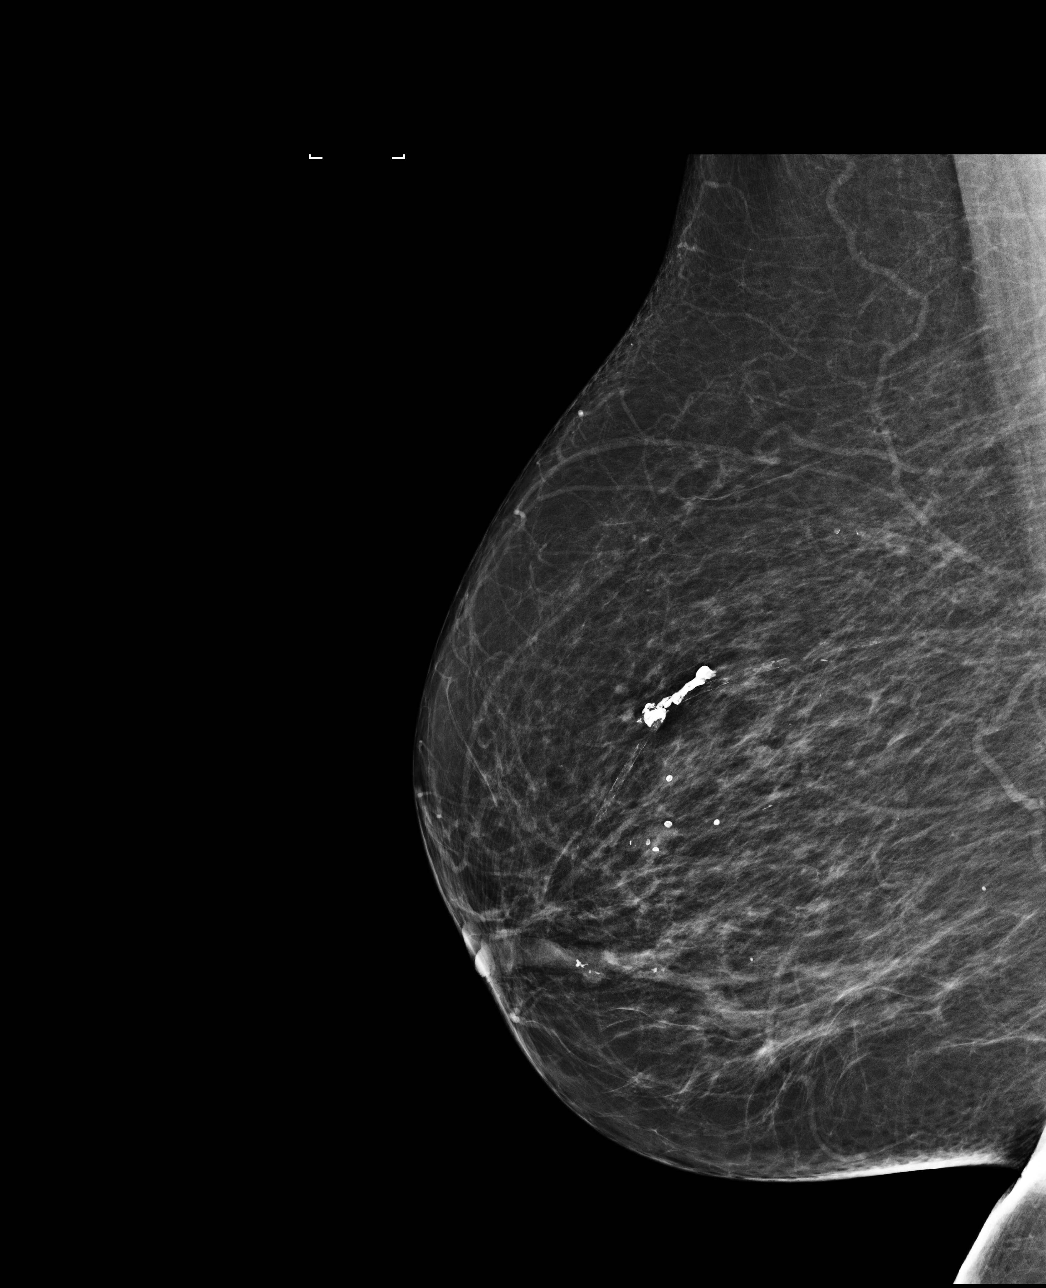

[L CC]
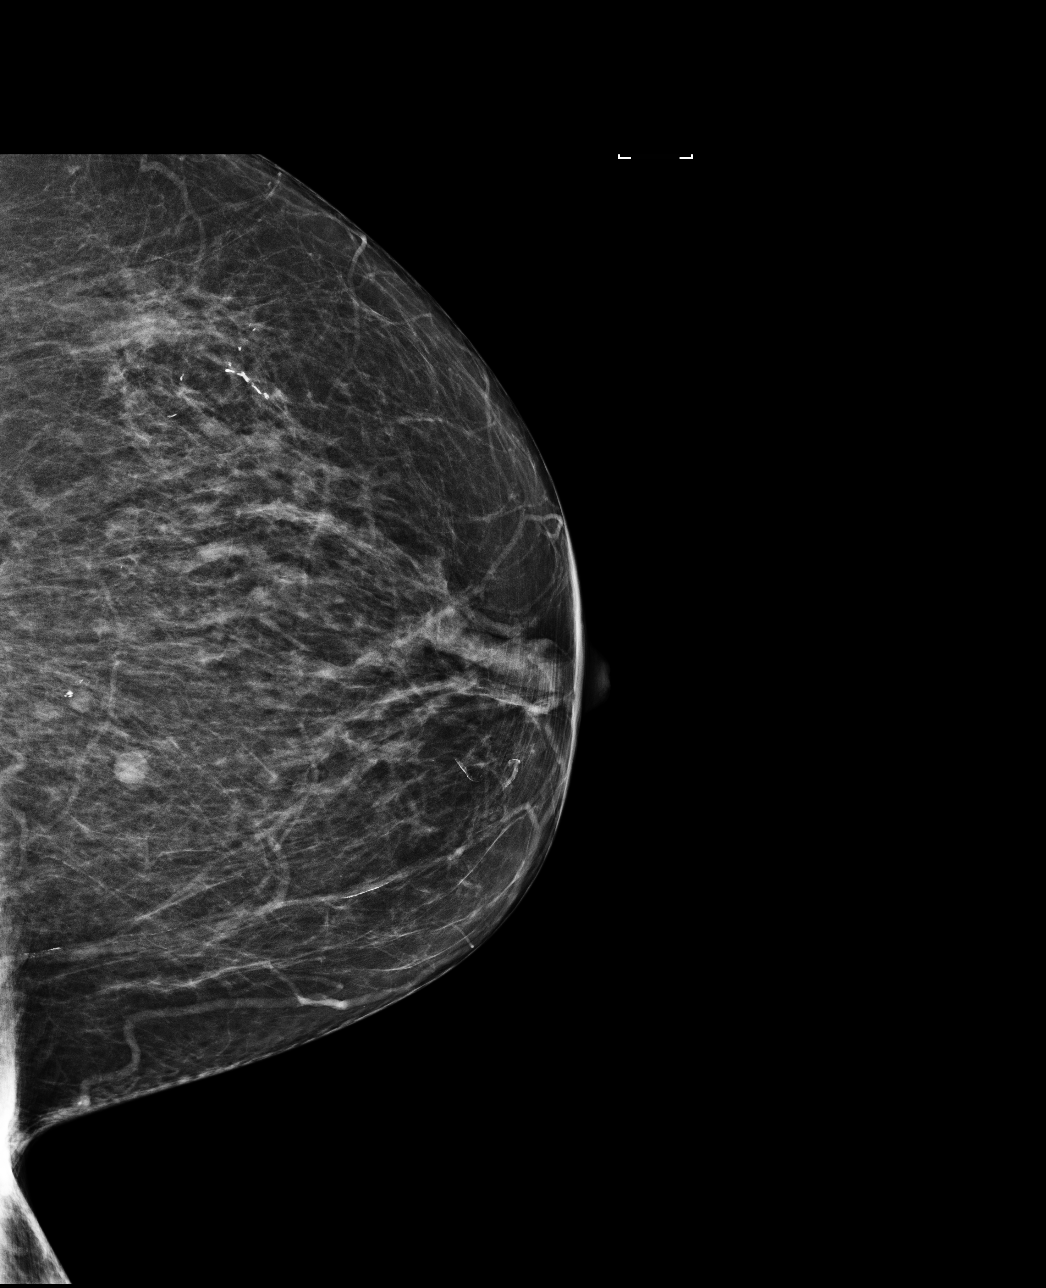

[R CC]
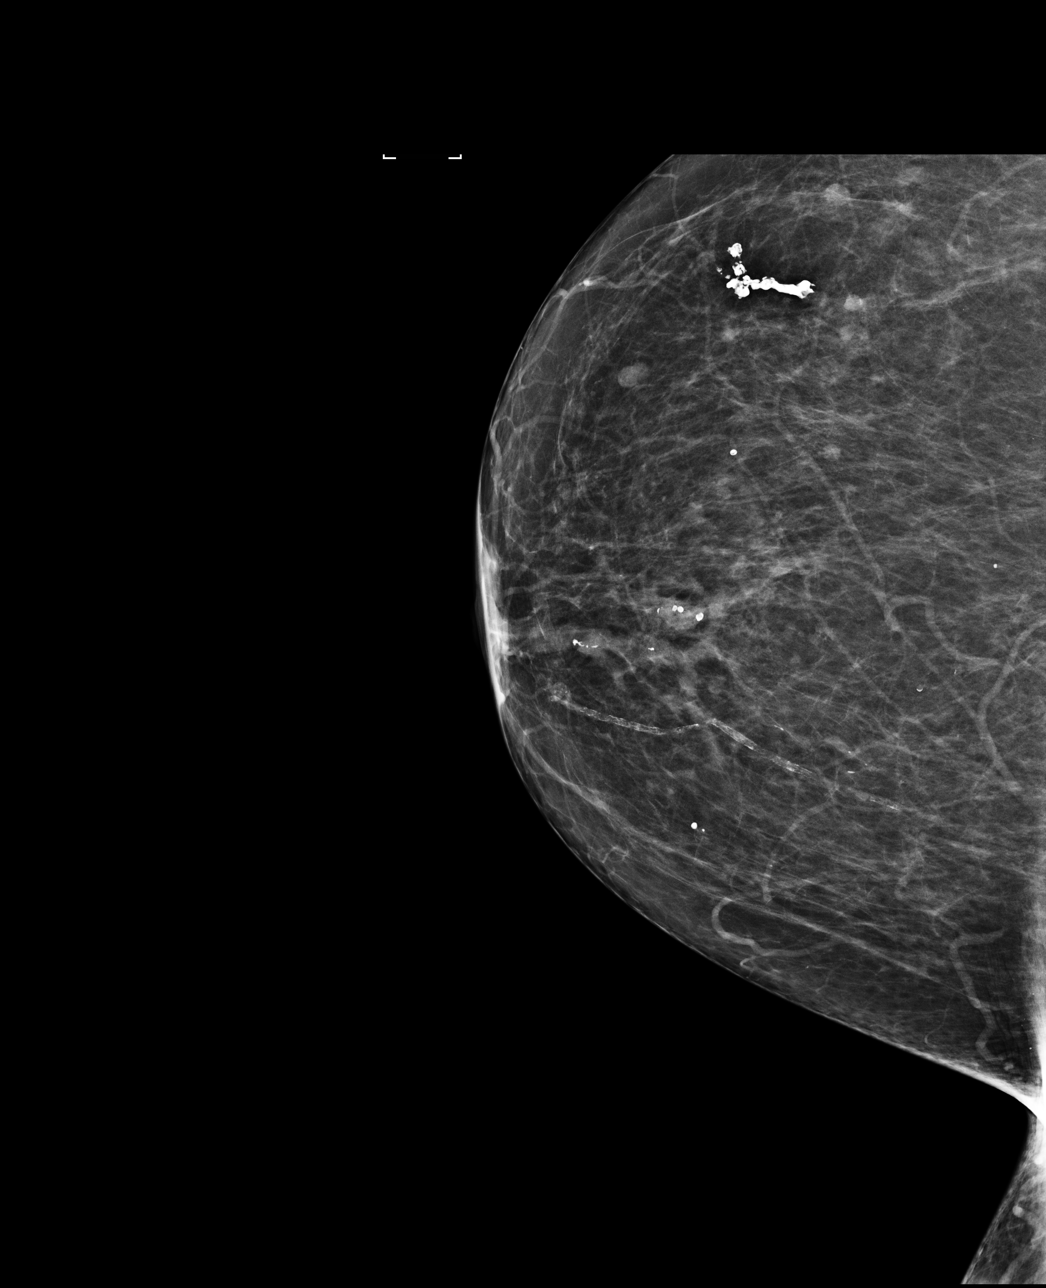

[L MLO]
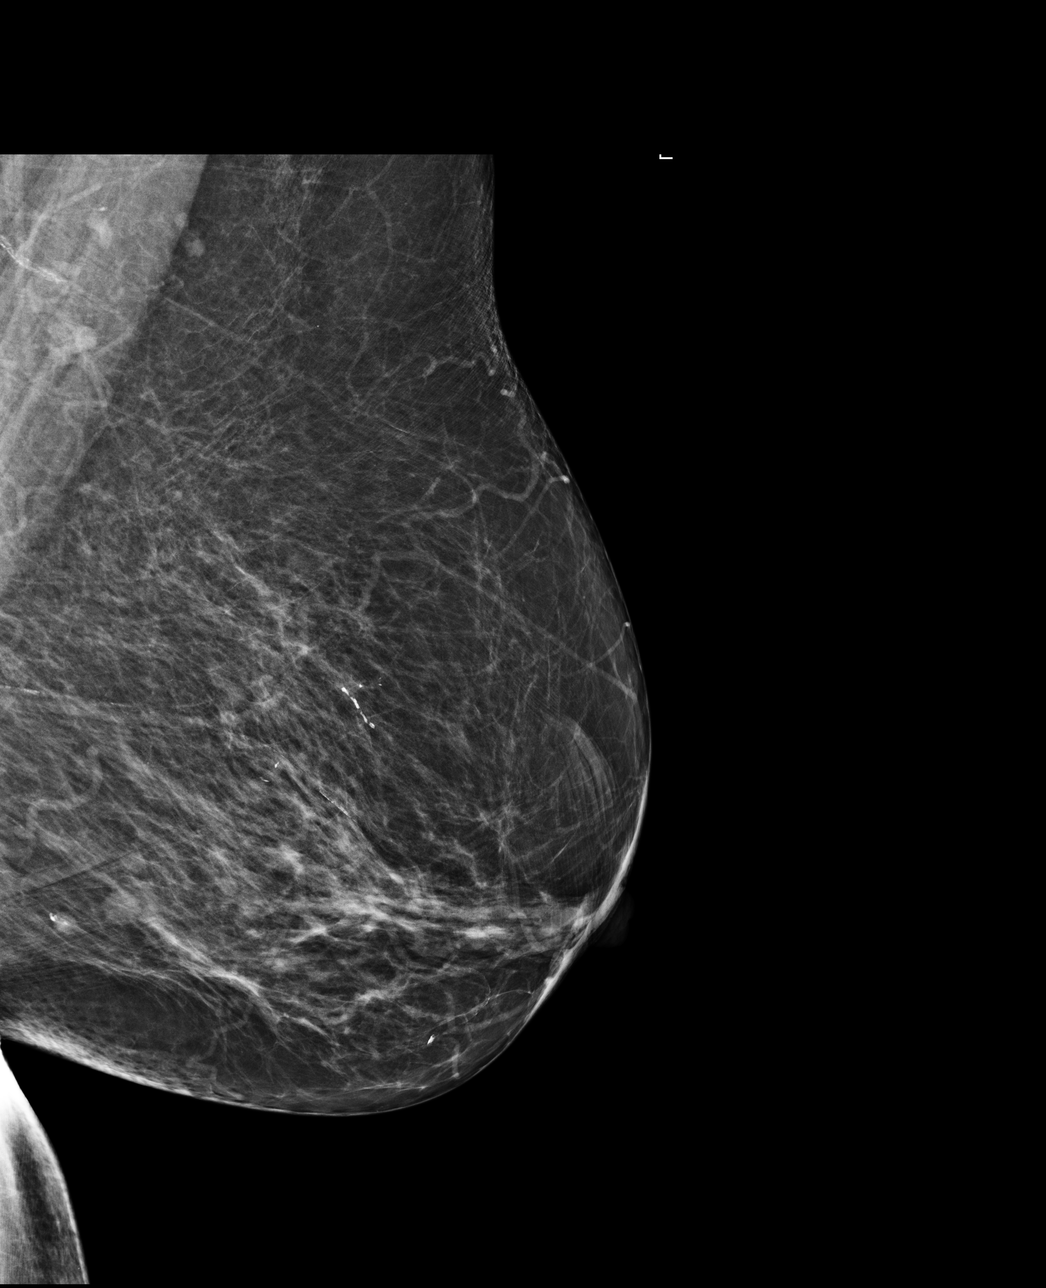

[4 of 4 positions shown; findings below may reference images not displayed]

ACR Breast Density Category b: There are scattered areas of
fibroglandular density.
FINDINGS: There are no findings suspicious for malignancy. Images were
processed with CAD.
IMPRESSION: No mammographic evidence of malignancy. A result letter of this
screening mammogram will be mailed directly to the patient.

RECOMMENDATION:
Screening mammogram in one year. (Code:AS-G-LCT)

BI-RADS CATEGORY  1: Negative.

## 2018-11-16 ENCOUNTER — Other Ambulatory Visit: Payer: Self-pay | Admitting: Family Medicine

## 2018-11-16 DIAGNOSIS — Z1231 Encounter for screening mammogram for malignant neoplasm of breast: Secondary | ICD-10-CM

## 2018-11-21 ENCOUNTER — Other Ambulatory Visit: Payer: Self-pay

## 2018-11-21 ENCOUNTER — Inpatient Hospital Stay (HOSPITAL_BASED_OUTPATIENT_CLINIC_OR_DEPARTMENT_OTHER): Payer: Medicare Other | Admitting: Oncology

## 2018-11-21 ENCOUNTER — Inpatient Hospital Stay: Payer: Medicare Other | Attending: Oncology

## 2018-11-21 ENCOUNTER — Inpatient Hospital Stay: Payer: Medicare Other

## 2018-11-21 ENCOUNTER — Encounter: Payer: Self-pay | Admitting: Oncology

## 2018-11-21 VITALS — BP 135/90 | HR 85 | Temp 97.9°F | Resp 18 | Wt 169.1 lb

## 2018-11-21 DIAGNOSIS — D631 Anemia in chronic kidney disease: Secondary | ICD-10-CM | POA: Diagnosis present

## 2018-11-21 DIAGNOSIS — E538 Deficiency of other specified B group vitamins: Secondary | ICD-10-CM | POA: Diagnosis not present

## 2018-11-21 DIAGNOSIS — N185 Chronic kidney disease, stage 5: Principal | ICD-10-CM

## 2018-11-21 DIAGNOSIS — Z87891 Personal history of nicotine dependence: Secondary | ICD-10-CM | POA: Insufficient documentation

## 2018-11-21 DIAGNOSIS — N184 Chronic kidney disease, stage 4 (severe): Secondary | ICD-10-CM | POA: Diagnosis present

## 2018-11-21 DIAGNOSIS — Z7901 Long term (current) use of anticoagulants: Secondary | ICD-10-CM

## 2018-11-21 DIAGNOSIS — D649 Anemia, unspecified: Secondary | ICD-10-CM

## 2018-11-21 LAB — CBC WITH DIFFERENTIAL/PLATELET
Abs Immature Granulocytes: 0 10*3/uL (ref 0.00–0.07)
Basophils Absolute: 0 10*3/uL (ref 0.0–0.1)
Basophils Relative: 1 %
EOS ABS: 0.1 10*3/uL (ref 0.0–0.5)
EOS PCT: 2 %
HCT: 34.8 % — ABNORMAL LOW (ref 36.0–46.0)
Hemoglobin: 10.2 g/dL — ABNORMAL LOW (ref 12.0–15.0)
Immature Granulocytes: 0 %
Lymphocytes Relative: 20 %
Lymphs Abs: 0.8 10*3/uL (ref 0.7–4.0)
MCH: 27.6 pg (ref 26.0–34.0)
MCHC: 29.3 g/dL — ABNORMAL LOW (ref 30.0–36.0)
MCV: 94.3 fL (ref 80.0–100.0)
MONO ABS: 0.3 10*3/uL (ref 0.1–1.0)
MONOS PCT: 8 %
NEUTROS ABS: 2.8 10*3/uL (ref 1.7–7.7)
Neutrophils Relative %: 69 %
PLATELETS: 178 10*3/uL (ref 150–400)
RBC: 3.69 MIL/uL — ABNORMAL LOW (ref 3.87–5.11)
RDW: 14.6 % (ref 11.5–15.5)
WBC: 4.1 10*3/uL (ref 4.0–10.5)
nRBC: 0 % (ref 0.0–0.2)

## 2018-11-21 LAB — RETIC PANEL
Immature Retic Fract: 9 % (ref 2.3–15.9)
RBC.: 3.69 MIL/uL — AB (ref 3.87–5.11)
RETIC COUNT ABSOLUTE: 56.8 10*3/uL (ref 19.0–186.0)
RETIC CT PCT: 1.5 % (ref 0.4–3.1)
RETICULOCYTE HEMOGLOBIN: 31 pg (ref >27.9)

## 2018-11-21 NOTE — Progress Notes (Signed)
Pt here for follow up. Pt seeing a cardiologist because her "heart is out of rhythm."

## 2018-11-21 NOTE — Progress Notes (Signed)
Hematology/Oncology Follow up note [facility] [phone] Fax:([phone]   Patient Care Team: [name], MD as PCP - General (Internal Medicine)  REFERRING PROVIDER: [name], MD  REASON FOR VISIT Follow up for treatment of anemia of chronic kidney disease.  HISTORY OF PRESENTING ILLNESS:  Jeanette Romero is a  67 y.o.  female with PMH listed below who was referred to me for evaluation of labile INR. She has extensive cardiac comorbidities including history of aortic mechanic valve replacement, aneurysm of aorta, PAF, Coronary artery disease. She was on Amiodarone and also had drug induced hyperthyroidism.  She was sent to me for evaluation of labile INR.  Extensive review of her medical records from primary care physician's office showed  10/27/2017 INR 5.9,  10/24/2017 INR 4.9 10/12/2017 INR 1.2 10/07/2017 INR 6  10/04/2017 INR 8.1   08/11/2017 INR 7.5  05/24/2017 INR 3.4 04/26/2017 INR 3.5 03/15/2017 INR 3.4 01/31/2017 INR 2.7  08/29/2017 Folate 2.8,  B12 825 and the patient was started on folic acid supplementation.  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 Amiodarone was stopped. And patient was referred to endocrinologist for evaluation. She was found out to have Graves' disease and was started on prednisone. Last TSH that was done in the [place] system showed a TSH of 4.8.  INTERVAL HISTORY Today patient presents for follow-up for management of anemia of CKD.  Has been on erythropoietin replacement treatment monthly.  Chronic fatigue has improved during the interval.  She established care with GI. Awaiting cardiology procedure [decision for catheter ablation  ]  to be done before colonoscopy.  Denies hematochezia, hematuria,  hematemesis, epistaxis, black tarry stool or easy bruising.   .   Review of Systems  Constitutional: Positive for malaise/fatigue. Negative for chills, fever and weight loss.  HENT: Negative for sore throat.   Eyes: Negative for redness.  Respiratory: Negative for cough, shortness of breath and wheezing.   Cardiovascular: Negative for chest pain, palpitations and leg swelling.       Irregular heart beats  Gastrointestinal: Negative for abdominal pain, blood in stool, nausea and vomiting.  Genitourinary: Negative for dysuria.  Musculoskeletal: Negative for myalgias.  Skin: Negative for rash.  Neurological: Negative for dizziness, tingling and tremors.  Endo/Heme/Allergies: Does not bruise/bleed easily.  Psychiatric/Behavioral: Negative for hallucinations.    MEDICAL HISTORY:  Past Medical History:  Diagnosis Date  . Anemia in chronic kidney disease (CKD) 11/14/2017  . Anxiety   . Aortic valve regurgitation   . Chronic kidney disease   . Coronary artery disease   . Dizziness   . Edema   . GERD (gastroesophageal reflux disease)   . Gout   . Hypercholesterolemia   . Hypertension   . Shortness of breath dyspnea     SURGICAL HISTORY: Past Surgical History:  Procedure Laterality Date  . ABDOMINAL HYSTERECTOMY    . AORTIC VALVE REPLACEMENT    . CARDIAC CATHETERIZATION    . CARDIAC CATHETERIZATION N/A 05/07/2015   Procedure: Left Heart Cath;  Surgeon: [name], MD;  Location: [place] [facility];  Service: Cardiovascular;  Laterality: N/A;  . CARDIAC VALVE REPLACEMENT    . ELECTROPHYSIOLOGIC STUDY N/A 05/05/2015   Procedure: Cardioversion;  Surgeon: [name], MD;  Location: [facility];  Service: Cardiovascular;  Laterality: N/A;  .  REPLACEMENT TOTAL KNEE BILATERAL      SOCIAL HISTORY: Social History   Socioeconomic History  . Marital status: Married    Spouse name: Not on file  . Number of children: Not on file  . Years of education: Not on file  . Highest  education level: Not on file  Occupational History  . Not on file  Social Needs  . Financial resource strain: Not on file  . Food insecurity:    Worry: Not on file    Inability: Not on file  . Transportation needs:    Medical: Not on file    Non-medical: Not on file  Tobacco Use  . Smoking status: Former Smoker    Packs/day: 0.50    Years: 20.00    Pack years: 10.00    Types: Cigarettes    Last attempt to quit: 2004    Years since quitting: 15.9  . Smokeless tobacco: Never Used  Substance and Sexual Activity  . Alcohol use: Yes    Alcohol/week: 0.0 standard drinks    Comment: rare  . Drug use: No  . Sexual activity: Not on file  Lifestyle  . Physical activity:    Days per week: Not on file    Minutes per session: Not on file  . Stress: Not on file  Relationships  . Social connections:    Talks on phone: Not on file    Gets together: Not on file    Attends religious service: Not on file    Active member of club or organization: Not on file    Attends meetings of clubs or organizations: Not on file    Relationship status: Not on file  . Intimate partner violence:    Fear of current or ex partner: Not on file    Emotionally abused: Not on file    Physically abused: Not on file    Forced sexual activity: Not on file  Other Topics Concern  . Not on file  Social History Narrative  . Not on file    FAMILY HISTORY: Family History  Problem Relation Age of Onset  . Hypertension Mother   . Colon cancer Mother   . Prostate cancer Father   . Kidney cancer Brother   . Prostate cancer Brother   . Prostate cancer Brother   . Breast cancer Neg Hx     ALLERGIES:  is allergic to atorvastatin.  MEDICATIONS:  Current Outpatient Medications  Medication Sig Dispense Refill  . ALPRAZolam (XANAX) 0.25 MG tablet Take 0.25 mg by mouth 2 (two) times daily.    Marland Kitchen amLODipine (NORVASC) 10 MG tablet Take 5 mg by mouth daily.     Marland Kitchen aspirin EC 81 MG tablet Take 81 mg by mouth daily.     . benazepril (LOTENSIN) 40 MG tablet Take 40 mg by mouth daily.    . cloNIDine (CATAPRES) 0.3 MG tablet Take 0.3 mg 3 (three) times daily by mouth.  2  . folic acid (FOLVITE) 1 MG tablet TAKE 1 TABLET(1 MG) BY MOUTH DAILY 30 tablet 0  . furosemide (LASIX) 40 MG tablet Take 40 mg by mouth daily.    . hydrALAZINE (APRESOLINE) 100 MG tablet Take 100 mg 3 (three) times daily by mouth.    . methimazole (TAPAZOLE) 5 MG tablet   11  . oxyCODONE (OXY IR/ROXICODONE) 5 MG immediate release tablet Take 5 mg by mouth every 4 (four) hours as needed.    . potassium chloride (K-DUR) 10 MEQ tablet Take  by mouth.    . rosuvastatin (CRESTOR) 5 MG tablet Take 5 mg by mouth at bedtime.  2  . SODIUM BICARBONATE, ANTACID, PO Take 10 mg by mouth.    . warfarin (COUMADIN) 2 MG tablet Take 4 mg by mouth daily.      No current facility-administered medications for this visit.      PHYSICAL EXAMINATION: ECOG PERFORMANCE STATUS: 1 - Symptomatic but completely ambulatory Vitals:   11/21/18 1422  BP: 135/90  Pulse: 85  Resp: 18  Temp: 97.9 F (36.6 C)   Filed Weights   11/21/18 1422  Weight: 169 lb 1.6 oz (76.7 kg)    Physical Exam  Constitutional: She is oriented to person, place, and time. No distress.  HENT:  Head: Normocephalic and atraumatic.  Nose: Nose normal.  Mouth/Throat: Oropharynx is clear and moist. No oropharyngeal exudate.  Eyes: Pupils are equal, round, and reactive to light. EOM are normal. Left eye exhibits no discharge. No scleral icterus.  pallor  Neck: Normal range of motion. Neck supple. No JVD present.  Cardiovascular: Normal rate and regular rhythm.  Murmur heard. Irregular beats  Pulmonary/Chest: Effort normal. No respiratory distress. She has no wheezes. She has no rales. She exhibits no tenderness.  Decreased breath sounds bilaterally.  No crackles  Abdominal: Soft. Bowel sounds are normal. She exhibits no distension and no mass. There is no tenderness. There is no  rebound.  Musculoskeletal: Normal range of motion. She exhibits no edema or tenderness.  Lymphadenopathy:    She has no cervical adenopathy.  Neurological: She is alert and oriented to person, place, and time. No cranial nerve deficit. She exhibits normal muscle tone. Gait normal. Coordination normal.  Skin: Skin is warm and dry. No rash noted. She is not diaphoretic. No erythema.  Psychiatric: Mood and affect normal.     LABORATORY DATA:  I have reviewed the data as listed Lab Results  Component Value Date   WBC 4.1 11/21/2018   HGB 10.2 (L) 11/21/2018   HCT 34.8 (L) 11/21/2018   MCV 94.3 11/21/2018   PLT 178 11/21/2018   Recent Labs    03/13/18 1040 04/01/18 0831 06/28/18 1841  NA 141 140 144  K 3.4* 3.0* 4.5  CL 108 105 110  CO2 23 26 24   GLUCOSE 102* 99 92  BUN 18 21* 30*  CREATININE 2.18* 1.94* 2.08*  CALCIUM 9.2 9.6 10.1  GFRNONAA 22* 26* 24*  GFRAA 26* 30* 27*  PROT 6.5  --  7.5  ALBUMIN 3.2*  --  3.9  AST 14*  --  18  ALT 9*  --  10  ALKPHOS 93  --  156*  BILITOT 0.6  --  0.5   Iron/TIBC/Ferritin/ %Sat    Component Value Date/Time   IRON 48 10/22/2018 1350   TIBC 287 10/22/2018 1350   FERRITIN 296 10/22/2018 1350   IRONPCTSAT 17 10/22/2018 1350    08/29/2017 Folate 2.8,  B12 825               01/02/2018 TSH 3.05  ASSESSMENT & PLAN:  1. Anemia in stage 4 chronic kidney disease (HCC)    #Anemia of chronic kidney disease.  Hemoglobin has improved to 10.2 Hold procrit  today.  Continue monitor Hemoglobin monthly.  Marland Kitchen  #History of folic acid deficiency: continue folic acid supplements daily.  On chronic anticoagualtion with coumadin.   All questions were answered. The patient knows to call the clinic with any problems questions or concerns.   Return of visit: 4 weeks for Procrit  Total face to face encounter time for this patient visit was 32min. >50% of the time was  spent in counseling and coordination of care.  Earlie Server, MD, PhD Hematology Oncology [facility] at [facility] Pager- [phone] 11/21/18

## 2018-11-26 ENCOUNTER — Other Ambulatory Visit: Payer: Self-pay | Admitting: Oncology

## 2018-12-21 ENCOUNTER — Other Ambulatory Visit: Payer: Self-pay

## 2018-12-21 ENCOUNTER — Inpatient Hospital Stay: Payer: Medicare Other | Attending: Oncology

## 2018-12-21 ENCOUNTER — Ambulatory Visit: Payer: Medicare Other | Admitting: Oncology

## 2018-12-21 ENCOUNTER — Ambulatory Visit: Payer: Medicare Other

## 2018-12-21 ENCOUNTER — Inpatient Hospital Stay: Payer: Medicare Other

## 2018-12-21 ENCOUNTER — Encounter: Payer: Self-pay | Admitting: Oncology

## 2018-12-21 ENCOUNTER — Inpatient Hospital Stay (HOSPITAL_BASED_OUTPATIENT_CLINIC_OR_DEPARTMENT_OTHER): Payer: Medicare Other | Admitting: Oncology

## 2018-12-21 ENCOUNTER — Other Ambulatory Visit: Payer: Medicare Other

## 2018-12-21 VITALS — BP 125/72 | HR 75 | Temp 96.4°F | Resp 18 | Wt 169.7 lb

## 2018-12-21 DIAGNOSIS — N184 Chronic kidney disease, stage 4 (severe): Secondary | ICD-10-CM | POA: Diagnosis present

## 2018-12-21 DIAGNOSIS — E039 Hypothyroidism, unspecified: Secondary | ICD-10-CM | POA: Diagnosis not present

## 2018-12-21 DIAGNOSIS — D631 Anemia in chronic kidney disease: Secondary | ICD-10-CM | POA: Diagnosis present

## 2018-12-21 DIAGNOSIS — D509 Iron deficiency anemia, unspecified: Secondary | ICD-10-CM | POA: Insufficient documentation

## 2018-12-21 DIAGNOSIS — Z87891 Personal history of nicotine dependence: Secondary | ICD-10-CM

## 2018-12-21 DIAGNOSIS — R011 Cardiac murmur, unspecified: Secondary | ICD-10-CM

## 2018-12-21 DIAGNOSIS — R9389 Abnormal findings on diagnostic imaging of other specified body structures: Secondary | ICD-10-CM

## 2018-12-21 DIAGNOSIS — D649 Anemia, unspecified: Secondary | ICD-10-CM

## 2018-12-21 DIAGNOSIS — N185 Chronic kidney disease, stage 5: Principal | ICD-10-CM

## 2018-12-21 DIAGNOSIS — Z7901 Long term (current) use of anticoagulants: Secondary | ICD-10-CM | POA: Diagnosis not present

## 2018-12-21 DIAGNOSIS — R5382 Chronic fatigue, unspecified: Secondary | ICD-10-CM

## 2018-12-21 LAB — CBC WITH DIFFERENTIAL/PLATELET
Abs Immature Granulocytes: 0 10*3/uL (ref 0.00–0.07)
Basophils Absolute: 0.1 10*3/uL (ref 0.0–0.1)
Basophils Relative: 1 %
Eosinophils Absolute: 0.1 10*3/uL (ref 0.0–0.5)
Eosinophils Relative: 2 %
HEMATOCRIT: 31.2 % — AB (ref 36.0–46.0)
HEMOGLOBIN: 9.1 g/dL — AB (ref 12.0–15.0)
Immature Granulocytes: 0 %
LYMPHS ABS: 0.8 10*3/uL (ref 0.7–4.0)
LYMPHS PCT: 16 %
MCH: 27.7 pg (ref 26.0–34.0)
MCHC: 29.2 g/dL — AB (ref 30.0–36.0)
MCV: 94.8 fL (ref 80.0–100.0)
MONO ABS: 0.5 10*3/uL (ref 0.1–1.0)
MONOS PCT: 11 %
Neutro Abs: 3.5 10*3/uL (ref 1.7–7.7)
Neutrophils Relative %: 70 %
Platelets: 212 10*3/uL (ref 150–400)
RBC: 3.29 MIL/uL — ABNORMAL LOW (ref 3.87–5.11)
RDW: 15 % (ref 11.5–15.5)
WBC: 5 10*3/uL (ref 4.0–10.5)
nRBC: 0 % (ref 0.0–0.2)

## 2018-12-21 LAB — RETIC PANEL
Immature Retic Fract: 10.3 % (ref 2.3–15.9)
RBC.: 3.29 MIL/uL — AB (ref 3.87–5.11)
RETIC COUNT ABSOLUTE: 84.2 10*3/uL (ref 19.0–186.0)
Retic Ct Pct: 2.6 % (ref 0.4–3.1)
Reticulocyte Hemoglobin: 28.9 pg (ref >27.9)

## 2018-12-21 MED ORDER — EPOETIN ALFA 40000 UNIT/ML IJ SOLN
40000.0000 [IU] | Freq: Once | INTRAMUSCULAR | Status: AC
  Administered 2018-12-21: 40000 [IU] via SUBCUTANEOUS
  Filled 2018-12-21: qty 1

## 2018-12-21 MED ORDER — EPOETIN ALFA 20000 UNIT/ML IJ SOLN
20000.0000 [IU] | Freq: Once | INTRAMUSCULAR | Status: AC
  Administered 2018-12-21: 20000 [IU] via SUBCUTANEOUS
  Filled 2018-12-21: qty 1

## 2018-12-21 MED ORDER — EPOETIN ALFA 20000 UNIT/ML IJ SOLN: 60000.0000 [IU] | Freq: Once | INTRAMUSCULAR | Status: DC

## 2018-12-21 NOTE — Progress Notes (Signed)
Patient here for follow up. No concerns voiced.  °

## 2018-12-21 NOTE — Progress Notes (Signed)
Hematology/Oncology Follow up note [facility] Telephone:(336) [phone] Fax:(336) [phone]   Patient Care Team: [name], MD as PCP - General (Internal Medicine)  REFERRING PROVIDER: [name], MD  REASON FOR VISIT Follow up for treatment of anemia of chronic kidney disease.  HISTORY OF PRESENTING ILLNESS:  Jeanette Romero is a  68 y.o.  female with PMH listed below who was referred to me for evaluation of labile INR. She has extensive cardiac comorbidities including history of aortic mechanic valve replacement, aneurysm of aorta, PAF, Coronary artery disease. She was on Amiodarone and also had drug induced hyperthyroidism.  She was sent to me for evaluation of labile INR.  Extensive review of her medical records from primary care physician's office showed  10/27/2017 INR 5.9,  10/24/2017 INR 4.9 10/12/2017 INR 1.2 10/07/2017 INR 6  10/04/2017 INR 8.1   08/11/2017 INR 7.5  05/24/2017 INR 3.4 04/26/2017 INR 3.5 03/15/2017 INR 3.4 01/31/2017 INR 2.7  08/29/2017 Folate 2.8,  B12 825 and the patient was started on folic acid supplementation.  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 Amiodarone was stopped. And patient was referred to endocrinologist for evaluation. She was found out to have Graves' disease and was started on prednisone. Last TSH that was done in the [place] system showed a TSH of 4.8.  INTERVAL HISTORY Today patient presents for follow-up for management of anemia of CKD.  Patient has been on erythropoietin replacement treatment monthly. Received 60,000 unit on 10/22/2018.  Hemoglobin went up to 10.2 on 11/21/2018. Chronic fatigue, slightly worse than last months.  Establish care with GI.  Awaiting cardiology procedure[decision for catheter  ablation] to be done before colonoscopy. Denies hematochezia, hematuria, hematemesis, epistaxis, black tarry stool or easy bruising.  Chronic anticoagulation with Coumadin for her cardiology problems.  .   Review of Systems  Constitutional: Positive for malaise/fatigue. Negative for chills, fever and weight loss.  HENT: Negative for sore throat.   Eyes: Negative for redness.  Respiratory: Negative for cough, shortness of breath and wheezing.   Cardiovascular: Negative for chest pain, palpitations and leg swelling.       Irregular heart beats  Gastrointestinal: Negative for abdominal pain, blood in stool, nausea and vomiting.  Genitourinary: Negative for dysuria.  Musculoskeletal: Negative for myalgias.  Skin: Negative for rash.  Neurological: Negative for dizziness, tingling and tremors.  Endo/Heme/Allergies: Does not bruise/bleed easily.  Psychiatric/Behavioral: Negative for hallucinations.    MEDICAL HISTORY:  Past Medical History:  Diagnosis Date  . Anemia in chronic kidney disease (CKD) 11/14/2017  . Anxiety   . Aortic valve regurgitation   . Chronic kidney disease   . Coronary artery disease   . Dizziness   . Edema   . GERD (gastroesophageal reflux disease)   . Gout   . Hypercholesterolemia   . Hypertension   . Shortness of breath dyspnea     SURGICAL HISTORY: Past Surgical History:  Procedure Laterality Date  . ABDOMINAL HYSTERECTOMY    . AORTIC VALVE REPLACEMENT    . CARDIAC CATHETERIZATION    . CARDIAC CATHETERIZATION N/A 05/07/2015   Procedure: Left Heart Cath;  Surgeon: [name], MD;  Location: [place] [facility];  Service: Cardiovascular;  Laterality: N/A;  . CARDIAC VALVE REPLACEMENT    . ELECTROPHYSIOLOGIC STUDY N/A 05/05/2015   Procedure: Cardioversion;  Surgeon: [name], MD;  Location: [facility] OR[facility];  Service: Cardiovascular;  Laterality: N/A;  . REPLACEMENT TOTAL KNEE BILATERAL      SOCIAL HISTORY: Social History   Socioeconomic History    . Marital status: Married    Spouse name: Not on file  . Number of children: Not on file  . Years of education: Not on file  . Highest education level: Not on file  Occupational History  . Not on file  Social Needs  . Financial resource strain: Not on file  . Food insecurity:    Worry: Not on file    Inability: Not on file  . Transportation needs:    Medical: Not on file    Non-medical: Not on file  Tobacco Use  . Smoking status: Former Smoker    Packs/day: 0.50    Years: 20.00    Pack years: 10.00    Types: Cigarettes    Last attempt to quit: 2004    Years since quitting: 16.0  . Smokeless tobacco: Never Used  Substance and Sexual Activity  . Alcohol use: Yes    Alcohol/week: 0.0 standard drinks    Comment: rare  . Drug use: No  . Sexual activity: Not on file  Lifestyle  . Physical activity:    Days per week: Not on file    Minutes per session: Not on file  . Stress: Not on file  Relationships  . Social connections:    Talks on phone: Not on file    Gets together: Not on file    Attends religious service: Not on file    Active member of club or organization: Not on file    Attends meetings of clubs or organizations: Not on file    Relationship status: Not on file  . Intimate partner violence:    Fear of current or ex partner: Not on file    Emotionally abused: Not on file    Physically abused: Not on file    Forced sexual activity: Not on file  Other Topics Concern  . Not on file  Social History Narrative  . Not on file    FAMILY HISTORY: Family History  Problem Relation Age of Onset  . Hypertension Mother   . Colon cancer Mother   . Prostate cancer Father   . Kidney cancer Brother   . Prostate cancer Brother   . Prostate cancer Brother   . Breast cancer Neg Hx     ALLERGIES:  is allergic to atorvastatin.  MEDICATIONS:  Current Outpatient Medications  Medication Sig Dispense Refill  . ALPRAZolam (XANAX) 0.25 MG tablet Take 0.25 mg by mouth 2  (two) times daily.    Marland Kitchen amLODipine (NORVASC) 10 MG tablet Take 5 mg by mouth daily.     Marland Kitchen aspirin EC 81 MG tablet Take 81 mg by mouth daily.    . benazepril (LOTENSIN) 40 MG tablet Take 40 mg by mouth daily.    . cloNIDine (CATAPRES) 0.3 MG tablet Take 0.3 mg 3 (three) times daily by mouth.  2  . folic acid (FOLVITE) 1 MG tablet TAKE 1 TABLET(1 MG) BY MOUTH DAILY 30 tablet 0  . furosemide (LASIX) 40 MG tablet Take 40 mg by mouth daily.    . hydrALAZINE (APRESOLINE) 100 MG tablet Take 100 mg 3 (three) times daily by mouth.    . methimazole (TAPAZOLE) 5 MG tablet   11  . oxyCODONE (OXY IR/ROXICODONE) 5 MG immediate release tablet Take 5 mg by  mouth every 4 (four) hours as needed.    . potassium chloride (K-DUR) 10 MEQ tablet Take by mouth.    . rosuvastatin (CRESTOR) 5 MG tablet Take 5 mg by mouth at bedtime.  2  . SODIUM BICARBONATE, ANTACID, PO Take 10 mg by mouth.    . warfarin (COUMADIN) 2 MG tablet Take 4 mg by mouth daily.      No current facility-administered medications for this visit.      PHYSICAL EXAMINATION: ECOG PERFORMANCE STATUS: 1 - Symptomatic but completely ambulatory Vitals:   12/21/18 0831  BP: 125/72  Pulse: 75  Resp: 18  Temp: (!) 96.4 F (35.8 C)  SpO2: 99%   Filed Weights   12/21/18 0831  Weight: 169 lb 11.2 oz (77 kg)    Physical Exam  Constitutional: She is oriented to person, place, and time. No distress.  HENT:  Head: Normocephalic and atraumatic.  Nose: Nose normal.  Mouth/Throat: Oropharynx is clear and moist. No oropharyngeal exudate.  Eyes: Pupils are equal, round, and reactive to light. EOM are normal. Left eye exhibits no discharge. No scleral icterus.  pallor  Neck: Normal range of motion. Neck supple. No JVD present.  Cardiovascular: Normal rate and regular rhythm.  Murmur heard. Irregular beats  Pulmonary/Chest: Effort normal. No respiratory distress. She has no wheezes. She has no rales. She exhibits no tenderness.  Decreased breath  sounds bilaterally.  No crackles  Abdominal: Soft. Bowel sounds are normal. She exhibits no distension and no mass. There is no abdominal tenderness. There is no rebound.  Musculoskeletal: Normal range of motion.        General: No tenderness or edema.  Lymphadenopathy:    She has no cervical adenopathy.  Neurological: She is alert and oriented to person, place, and time. No cranial nerve deficit. She exhibits normal muscle tone. Gait normal. Coordination normal.  Skin: Skin is warm and dry. No rash noted. She is not diaphoretic. No erythema.  Psychiatric: Mood and affect normal.     LABORATORY DATA:  I have reviewed the data as listed Lab Results  Component Value Date   WBC 5.0 12/21/2018   HGB 9.1 (L) 12/21/2018   HCT 31.2 (L) 12/21/2018   MCV 94.8 12/21/2018   PLT 212 12/21/2018   Recent Labs    03/13/18 1040 04/01/18 0831 06/28/18 1841  NA 141 140 144  K 3.4* 3.0* 4.5  CL 108 105 110  CO2 23 26 24   GLUCOSE 102* 99 92  BUN 18 21* 30*  CREATININE 2.18* 1.94* 2.08*  CALCIUM 9.2 9.6 10.1  GFRNONAA 22* 26* 24*  GFRAA 26* 30* 27*  PROT 6.5  --  7.5  ALBUMIN 3.2*  --  3.9  AST 14*  --  18  ALT 9*  --  10  ALKPHOS 93  --  156*  BILITOT 0.6  --  0.5   Iron/TIBC/Ferritin/ %Sat    Component Value Date/Time   IRON 48 10/22/2018 1350   TIBC 287 10/22/2018 1350   FERRITIN 296 10/22/2018 1350   IRONPCTSAT 17 10/22/2018 1350    08/29/2017 Folate 2.8,  B12 825              01/02/2018 TSH 3.05  RADIOGRAPHIC STUDIES: I have personally reviewed the radiological images as listed and agreed with the findings in the report. 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  Mild aneurysmal  dilation of the proximal abdominal aorta 3.7 cm.  Femorofemoral crossover graft and right axillo femoral bypass graft noted.  Rounded fluid collection surrounds the distal aspect of the axillofemoral bypass graft.  Presumably postoperative seroma.  Moderate stool burden in the colon.  2 cm soft tissue nodule in the left posterior mediastinum presumably mildly enlarged lymph node.  This could be followed with a repeat CT chest in 6 months to assess stability.  Cardiomegaly.  CAD.                                                                                                                                                                                                                                                                                                                                          ASSESSMENT & PLAN:  1. Anemia in stage 4 chronic kidney disease ([place])   2. Chronic anticoagulation   3. Fatigue associated with anemia   4. Heart murmur   5. Abnormal CT scan   6. Iron deficiency anemia, unspecified iron deficiency anemia type    #Anemia of chronic kidney disease Hemoglobin 9.1 today.  We will proceed with Procrit 60,000 units today. Plan H&H every 4 weeks prior to Procrit 60,000 unit injections with holding parameters as below: Hold if SBP> 170 or DBP> 100; hold if hemoglobin> 10  #Functional iron deficiency. Iron panel 10/22/2018 showed ferritin level of 296. Retic panel showed reticulocyte hemoglobin 20.8 0.9.  Continue monitoring. I have referred patient to gastroenterology and she establish care.  Awaiting cardiology procedure before colonoscopy.  She is on chronic anticoagulation with Coumadin for cardiology  problems.  No clinical signs of acute bleeding.  Need to rule out chronic GI bleeding.  #History of folic acid deficiency, loud heart murmur, recommend patient to continue folic acid supplements daily.:  #Hypothyroidism, patient is on methimazole.  Normal white  count. #Abnormal CT finding: Patient had CT abdomen pelvis without contrast on 06/28/2018 for evaluation of right lower abdomen swelling.  There were incidental findings of 2 cm soft tissue nodule in the left posterior mediastinum.  Questionable enlarged lymph nodes.  I discussed with patient about the need of repeat a CT chest to evaluate the stability.  Patient would like to discuss with her primary care physician.  I printed a copy of patient's CT result and provide to her.  Look forward  All questions were answered. The patient knows to call the clinic with any problems questions or concerns.   Return of visit: H&H every 4 weeks prior to receiving Procrit  with holding parameters.  Lab MD Procrit in 3 months -lab CBC, ferritin and iron TIBC to be done prior to the visit.    Earlie Server, MD, PhD Hematology Oncology [facility] at [facility] Pager- [phone] 12/21/18

## 2018-12-26 ENCOUNTER — Ambulatory Visit: Admission: RE | Admit: 2018-12-26 | Payer: Self-pay | Source: Home / Self Care | Admitting: Internal Medicine

## 2018-12-26 ENCOUNTER — Encounter: Admission: RE | Payer: Self-pay | Source: Home / Self Care

## 2018-12-26 ENCOUNTER — Ambulatory Visit
Admission: RE | Admit: 2018-12-26 | Discharge: 2018-12-26 | Disposition: A | Discharge status: Home / Self Care | Payer: Medicare Other | Source: Ambulatory Visit | Attending: Family Medicine | Admitting: Family Medicine

## 2018-12-26 ENCOUNTER — Ambulatory Visit: Admission: RE | Admit: 2018-12-26 | Payer: Medicare Other | Source: Home / Self Care | Admitting: Internal Medicine

## 2018-12-26 DIAGNOSIS — Z1231 Encounter for screening mammogram for malignant neoplasm of breast: Secondary | ICD-10-CM | POA: Insufficient documentation

## 2018-12-26 SURGERY — COLONOSCOPY WITH PROPOFOL
Anesthesia: General

## 2019-01-02 ENCOUNTER — Other Ambulatory Visit: Payer: Self-pay | Admitting: Oncology

## 2019-01-21 ENCOUNTER — Inpatient Hospital Stay: Payer: Medicare Other

## 2019-01-21 ENCOUNTER — Inpatient Hospital Stay: Payer: Medicare Other | Attending: Oncology

## 2019-01-21 VITALS — BP 108/71 | HR 73

## 2019-01-21 DIAGNOSIS — D631 Anemia in chronic kidney disease: Secondary | ICD-10-CM | POA: Insufficient documentation

## 2019-01-21 DIAGNOSIS — N184 Chronic kidney disease, stage 4 (severe): Secondary | ICD-10-CM | POA: Insufficient documentation

## 2019-01-21 DIAGNOSIS — N185 Chronic kidney disease, stage 5: Principal | ICD-10-CM

## 2019-01-21 LAB — HEMOGLOBIN: Hemoglobin: 8.7 g/dL — ABNORMAL LOW (ref 12.0–15.0)

## 2019-01-21 LAB — HEMATOCRIT: HCT: 29.3 % — ABNORMAL LOW (ref 36.0–46.0)

## 2019-01-21 MED ORDER — EPOETIN ALFA 40000 UNIT/ML IJ SOLN
40000.0000 [IU] | Freq: Once | INTRAMUSCULAR | Status: AC
  Administered 2019-01-21: 40000 [IU] via SUBCUTANEOUS

## 2019-01-21 MED ORDER — EPOETIN ALFA 20000 UNIT/ML IJ SOLN: 60000.0000 [IU] | Freq: Once | INTRAMUSCULAR | Status: DC

## 2019-01-21 MED ORDER — EPOETIN ALFA 20000 UNIT/ML IJ SOLN
20000.0000 [IU] | Freq: Once | INTRAMUSCULAR | Status: AC
  Administered 2019-01-21: 20000 [IU] via SUBCUTANEOUS

## 2019-02-17 ENCOUNTER — Emergency Department: Payer: Medicare Other

## 2019-02-17 ENCOUNTER — Other Ambulatory Visit: Payer: Self-pay

## 2019-02-17 ENCOUNTER — Inpatient Hospital Stay
Admission: EM | Admit: 2019-02-17 | Discharge: 2019-02-22 | DRG: 378 | Disposition: A | Discharge status: Home / Self Care | Payer: Medicare Other | Attending: Internal Medicine | Admitting: Internal Medicine

## 2019-02-17 DIAGNOSIS — Z8042 Family history of malignant neoplasm of prostate: Secondary | ICD-10-CM

## 2019-02-17 DIAGNOSIS — I2489 Other forms of acute ischemic heart disease: Secondary | ICD-10-CM

## 2019-02-17 DIAGNOSIS — N189 Chronic kidney disease, unspecified: Secondary | ICD-10-CM

## 2019-02-17 DIAGNOSIS — I2510 Atherosclerotic heart disease of native coronary artery without angina pectoris: Secondary | ICD-10-CM | POA: Diagnosis present

## 2019-02-17 DIAGNOSIS — Z7982 Long term (current) use of aspirin: Secondary | ICD-10-CM | POA: Diagnosis not present

## 2019-02-17 DIAGNOSIS — R7989 Other specified abnormal findings of blood chemistry: Secondary | ICD-10-CM | POA: Diagnosis not present

## 2019-02-17 DIAGNOSIS — D120 Benign neoplasm of cecum: Secondary | ICD-10-CM | POA: Diagnosis not present

## 2019-02-17 DIAGNOSIS — N184 Chronic kidney disease, stage 4 (severe): Secondary | ICD-10-CM | POA: Diagnosis present

## 2019-02-17 DIAGNOSIS — I447 Left bundle-branch block, unspecified: Secondary | ICD-10-CM | POA: Diagnosis present

## 2019-02-17 DIAGNOSIS — D62 Acute posthemorrhagic anemia: Secondary | ICD-10-CM

## 2019-02-17 DIAGNOSIS — D631 Anemia in chronic kidney disease: Secondary | ICD-10-CM | POA: Diagnosis present

## 2019-02-17 DIAGNOSIS — K259 Gastric ulcer, unspecified as acute or chronic, without hemorrhage or perforation: Secondary | ICD-10-CM

## 2019-02-17 DIAGNOSIS — Z87891 Personal history of nicotine dependence: Secondary | ICD-10-CM

## 2019-02-17 DIAGNOSIS — K2289 Other specified disease of esophagus: Secondary | ICD-10-CM

## 2019-02-17 DIAGNOSIS — K648 Other hemorrhoids: Secondary | ICD-10-CM | POA: Diagnosis not present

## 2019-02-17 DIAGNOSIS — Z952 Presence of prosthetic heart valve: Secondary | ICD-10-CM

## 2019-02-17 DIAGNOSIS — Z7901 Long term (current) use of anticoagulants: Secondary | ICD-10-CM

## 2019-02-17 DIAGNOSIS — Z8249 Family history of ischemic heart disease and other diseases of the circulatory system: Secondary | ICD-10-CM

## 2019-02-17 DIAGNOSIS — K921 Melena: Secondary | ICD-10-CM | POA: Diagnosis not present

## 2019-02-17 DIAGNOSIS — Z8051 Family history of malignant neoplasm of kidney: Secondary | ICD-10-CM

## 2019-02-17 DIAGNOSIS — N2581 Secondary hyperparathyroidism of renal origin: Secondary | ICD-10-CM | POA: Diagnosis present

## 2019-02-17 DIAGNOSIS — I248 Other forms of acute ischemic heart disease: Secondary | ICD-10-CM | POA: Diagnosis present

## 2019-02-17 DIAGNOSIS — N179 Acute kidney failure, unspecified: Secondary | ICD-10-CM | POA: Diagnosis present

## 2019-02-17 DIAGNOSIS — R0600 Dyspnea, unspecified: Secondary | ICD-10-CM

## 2019-02-17 DIAGNOSIS — M109 Gout, unspecified: Secondary | ICD-10-CM | POA: Diagnosis present

## 2019-02-17 DIAGNOSIS — D649 Anemia, unspecified: Secondary | ICD-10-CM | POA: Diagnosis not present

## 2019-02-17 DIAGNOSIS — K228 Other specified diseases of esophagus: Secondary | ICD-10-CM | POA: Diagnosis not present

## 2019-02-17 DIAGNOSIS — E0590 Thyrotoxicosis, unspecified without thyrotoxic crisis or storm: Secondary | ICD-10-CM | POA: Diagnosis present

## 2019-02-17 DIAGNOSIS — I4819 Other persistent atrial fibrillation: Secondary | ICD-10-CM | POA: Diagnosis present

## 2019-02-17 DIAGNOSIS — Z800 Family history of malignant neoplasm of digestive organs: Secondary | ICD-10-CM

## 2019-02-17 DIAGNOSIS — E785 Hyperlipidemia, unspecified: Secondary | ICD-10-CM | POA: Diagnosis present

## 2019-02-17 DIAGNOSIS — Z888 Allergy status to other drugs, medicaments and biological substances status: Secondary | ICD-10-CM

## 2019-02-17 DIAGNOSIS — I77810 Thoracic aortic ectasia: Secondary | ICD-10-CM | POA: Diagnosis present

## 2019-02-17 DIAGNOSIS — K219 Gastro-esophageal reflux disease without esophagitis: Secondary | ICD-10-CM | POA: Diagnosis present

## 2019-02-17 DIAGNOSIS — K2270 Barrett's esophagus without dysplasia: Secondary | ICD-10-CM | POA: Diagnosis present

## 2019-02-17 DIAGNOSIS — E7800 Pure hypercholesterolemia, unspecified: Secondary | ICD-10-CM | POA: Diagnosis present

## 2019-02-17 DIAGNOSIS — K922 Gastrointestinal hemorrhage, unspecified: Secondary | ICD-10-CM

## 2019-02-17 DIAGNOSIS — K5521 Angiodysplasia of colon with hemorrhage: Principal | ICD-10-CM | POA: Diagnosis present

## 2019-02-17 DIAGNOSIS — R778 Other specified abnormalities of plasma proteins: Secondary | ICD-10-CM

## 2019-02-17 DIAGNOSIS — R0602 Shortness of breath: Secondary | ICD-10-CM | POA: Diagnosis present

## 2019-02-17 DIAGNOSIS — Z96653 Presence of artificial knee joint, bilateral: Secondary | ICD-10-CM | POA: Diagnosis present

## 2019-02-17 DIAGNOSIS — D122 Benign neoplasm of ascending colon: Secondary | ICD-10-CM

## 2019-02-17 DIAGNOSIS — I081 Rheumatic disorders of both mitral and tricuspid valves: Secondary | ICD-10-CM | POA: Diagnosis present

## 2019-02-17 HISTORY — DX: Presence of prosthetic heart valve: Z95.2

## 2019-02-17 LAB — PROTIME-INR
INR: 1.9 — ABNORMAL HIGH (ref 0.8–1.2)
Prothrombin Time: 21.8 seconds — ABNORMAL HIGH (ref 11.4–15.2)

## 2019-02-17 LAB — CBC
HCT: 20.7 % — ABNORMAL LOW (ref 36.0–46.0)
Hemoglobin: 6.1 g/dL — ABNORMAL LOW (ref 12.0–15.0)
MCH: 29.3 pg (ref 26.0–34.0)
MCHC: 29.5 g/dL — AB (ref 30.0–36.0)
MCV: 99.5 fL (ref 80.0–100.0)
Platelets: 228 10*3/uL (ref 150–400)
RBC: 2.08 MIL/uL — ABNORMAL LOW (ref 3.87–5.11)
RDW: 16.7 % — ABNORMAL HIGH (ref 11.5–15.5)
WBC: 5.6 10*3/uL (ref 4.0–10.5)
nRBC: 0.4 % — ABNORMAL HIGH (ref 0.0–0.2)

## 2019-02-17 LAB — BASIC METABOLIC PANEL
Anion gap: 8 (ref 5–15)
BUN: 90 mg/dL — AB (ref 8–23)
CO2: 22 mmol/L (ref 22–32)
Calcium: 8.7 mg/dL — ABNORMAL LOW (ref 8.9–10.3)
Chloride: 111 mmol/L (ref 98–111)
Creatinine, Ser: 2.68 mg/dL — ABNORMAL HIGH (ref 0.44–1.00)
GFR calc Af Amer: 20 mL/min — ABNORMAL LOW (ref >60)
GFR calc non Af Amer: 18 mL/min — ABNORMAL LOW (ref >60)
Glucose, Bld: 101 mg/dL — ABNORMAL HIGH (ref 70–99)
Potassium: 4.7 mmol/L (ref 3.5–5.1)
Sodium: 141 mmol/L (ref 135–145)

## 2019-02-17 LAB — TROPONIN I: TROPONIN I: 1.35 ng/mL — AB (ref <0.03)

## 2019-02-17 LAB — PREPARE RBC (CROSSMATCH)

## 2019-02-17 MED ORDER — PANTOPRAZOLE SODIUM 40 MG IV SOLR
40.0000 mg | Freq: Once | INTRAVENOUS | Status: AC
  Administered 2019-02-17: 40 mg via INTRAVENOUS
  Filled 2019-02-17: qty 40

## 2019-02-17 MED ORDER — SODIUM CHLORIDE 0.9 % IV SOLN
10.0000 mL/h | Freq: Once | INTRAVENOUS | Status: AC
  Administered 2019-02-17: 10 mL/h via INTRAVENOUS

## 2019-02-17 NOTE — Progress Notes (Signed)
Advanced Care Plan.  Purpose of Encounter: CODE STATUS. Parties in Attendance: The patient, her daughter and brother, me. Patient's Decisional Capacity: Yes. Medical Story: Jeanette Romero  is a 68 y.o. female with a known history of anemia, CKD, anxiety, aortic valve regurgitation, CAD, GERD, gout, hypertension, hyperlipidemia etc.   the patient is being admitted for symptomatic anemia due to GI bleeding, ARF on CKD and elevated troponin.  I discussed with patient about her current condition, prognosis and CODE STATUS.  Initially, the patient did not want to be resuscitated or intubated if she has cardiopulmonary arrest.  Her daughter and brother want her to be resuscitated and intubated.  After discussion with her family, she decided full code. Plan:  Code Status: Full code  Time spent discussing advance care planning: 18 minutes.

## 2019-02-17 NOTE — ED Provider Notes (Signed)
[facility] Emergency Department Provider Note  ____________________________________________  Time seen: Approximately 6:33 PM  I have reviewed the triage vital signs and the nursing notes.   HISTORY  Chief Complaint Shortness of Breath    HPI Jeanette Romero is a 68 y.o. female with a history of CKD associated with anemia, GERD, hypertension, atrial fibrillation, status post mechanical aortic valve in 2004 who complains of malaise, generalized weakness and fatigue, and dyspnea on exertion for the past 2 days.  Also was noted to have a low blood pressure at home when sitting and taking her pressure with a home device.  Symptoms have been ongoing for the past 2 days.  Worse with movement, no alleviating factors.  Denies PND or orthopnea.  Denies peripheral edema.  Endorses black stool, denies bloody diarrhea.  She is on warfarin chronically, reports being in therapeutic range when checked.  Review of EMR shows that INR was 2.2 about a month ago.     Past Medical History:  Diagnosis Date  . Anemia in chronic kidney disease (CKD) 11/14/2017  . Anxiety   . Aortic valve regurgitation   . Chronic kidney disease   . Coronary artery disease   . Dizziness   . Edema   . GERD (gastroesophageal reflux disease)   . Gout   . Hypercholesterolemia   . Hypertension   . Shortness of breath dyspnea      Patient Active Problem List   Diagnosis Date Noted  . Anemia in chronic kidney disease (CKD) 11/14/2017  . Normocytic anemia 10/31/2017  . Fatigue associated with anemia 10/31/2017  . Depression 10/31/2017  . Hyperthyroidism 10/31/2017  . Chronic anticoagulation 10/31/2017  . Bradycardia 05/05/2015     Past Surgical History:  Procedure Laterality Date  . ABDOMINAL HYSTERECTOMY    . AORTIC VALVE REPLACEMENT    . CARDIAC CATHETERIZATION    . CARDIAC CATHETERIZATION N/A 05/07/2015   Procedure: Left Heart Cath;  Surgeon: [name], MD;  Location: [place] [facility];  Service: Cardiovascular;  Laterality: N/A;  . CARDIAC VALVE REPLACEMENT    . ELECTROPHYSIOLOGIC STUDY N/A 05/05/2015   Procedure: Cardioversion;  Surgeon: [name], MD;  Location: [facility] ORS;  Service: Cardiovascular;  Laterality: N/A;  . REPLACEMENT TOTAL KNEE BILATERAL       Prior to Admission medications   Medication Sig Start Date End Date Taking? Authorizing Provider  ALPRAZolam (XANAX) 0.25 MG tablet Take 0.25 mg by mouth 2 (two) times daily.   Yes [provider]  amLODipine (NORVASC) 10 MG tablet Take 5 mg by mouth daily.    Yes [provider]  aspirin EC 81 MG tablet Take 81 mg by mouth daily.   Yes [provider]  benazepril (LOTENSIN) 40 MG tablet Take 40 mg by mouth daily.   Yes [provider]  cloNIDine (CATAPRES) 0.3 MG tablet Take 0.3 mg 3 (three) times daily by mouth. 10/14/17  Yes [provider]  folic acid (FOLVITE) 1 MG tablet TAKE 1 TABLET(1 MG) BY MOUTH DAILY 01/02/19  Yes Earlie Server, MD  furosemide (LASIX) 20 MG tablet Take 20-40 mg by mouth daily. Take one tablet on odd days and two tablets on even days 12/14/18  Yes [provider]  hydrALAZINE (APRESOLINE) 100 MG tablet Take 100 mg 3 (three) times daily by mouth.   Yes [provider]  methimazole (TAPAZOLE) 5 MG tablet Take 5 mg by mouth daily.  07/06/18  Yes [provider]  potassium chloride (K-DUR) 10 MEQ tablet Take 10 mEq by mouth daily.  05/04/18 05/04/19 Yes [provider]  rosuvastatin (CRESTOR) 5 MG tablet Take 5 mg by mouth at bedtime. 02/20/18  Yes [provider]  warfarin (COUMADIN) 2 MG tablet Take 4 mg by mouth daily.    Yes [provider]  warfarin (COUMADIN) 4 MG tablet Take 4 mg by mouth daily. 01/13/19  Yes [provider]  amoxicillin (AMOXIL) 500 MG tablet Take 2,000 mg by mouth as needed. 01/17/19   [provider]  SODIUM BICARBONATE, ANTACID, PO Take 10 mg by  mouth.    [provider]     Allergies Atorvastatin   Family History  Problem Relation Age of Onset  . Hypertension Mother   . Colon cancer Mother   . Prostate cancer Father   . Kidney cancer Brother   . Prostate cancer Brother   . Prostate cancer Brother   . Breast cancer Neg Hx     Social History Social History   Tobacco Use  . Smoking status: Former Smoker    Packs/day: 0.50    Years: 20.00    Pack years: 10.00    Types: Cigarettes    Last attempt to quit: 2004    Years since quitting: 16.1  . Smokeless tobacco: Never Used  Substance Use Topics  . Alcohol use: Yes    Alcohol/week: 0.0 standard drinks    Comment: rare  . Drug use: No    Review of Systems  Constitutional:   No fever or chills.  ENT:   No sore throat. No rhinorrhea. Cardiovascular:   No chest pain or syncope. Respiratory: Positive dyspnea on exertion, no cough. Gastrointestinal:   Negative for abdominal pain, vomiting and diarrhea.  Musculoskeletal:   Negative for focal pain or swelling All other systems reviewed and are negative except as documented above in ROS and HPI.  ____________________________________________   PHYSICAL EXAM:  VITAL SIGNS: ED Triage Vitals  Enc Vitals Group     BP 02/17/19 1534 (!) 107/51     Pulse Rate 02/17/19 1534 66     Resp 02/17/19 1534 16     Temp 02/17/19 1534 98 F (36.7 C)     Temp Source 02/17/19 1534 Oral     SpO2 02/17/19 1534 96 %     Weight 02/17/19 1535 172 lb (78 kg)     Height 02/17/19 1535 5\' 2"  (1.575 m)     Head Circumference --      Peak Flow --      Pain Score 02/17/19 1535 0     Pain Loc --      Pain Edu? --      Excl. in [place]? --     Vital signs reviewed, nursing assessments reviewed.   Constitutional:   Alert and oriented. Non-toxic appearance. Eyes:   Conjunctivae are normal. EOMI. PERRL. ENT      Head:   Normocephalic and atraumatic.      Nose:   No congestion/rhinnorhea.       Mouth/Throat:   MMM, no  pharyngeal erythema. No peritonsillar mass.       Neck:   No meningismus. Full ROM. Hematological/Lymphatic/Immunilogical:   No cervical lymphadenopathy. Cardiovascular:   RRR. Symmetric bilateral radial and DP pulses.  No murmurs. Cap refill less than 2 seconds. Respiratory:   Normal respiratory effort without tachypnea/retractions. Breath sounds are clear and equal bilaterally. No wheezes/rales/rhonchi. Gastrointestinal:   Soft and nontender. Non distended. There  is no CVA tenderness.  No rebound, rigidity, or guarding.  Rectal exam shows formed black stool, strongly Hemoccult positive. Musculoskeletal:   Normal range of motion in all extremities. No joint effusions.  No lower extremity tenderness.  No edema. Neurologic:   Normal speech and language.  Motor grossly intact. No acute focal neurologic deficits are appreciated.  Skin:    Skin is warm, dry and intact. No rash noted.  No petechiae, purpura, or bullae.  ____________________________________________    LABS (pertinent positives/negatives) (all labs ordered are listed, but only abnormal results are displayed) Labs Reviewed  BASIC METABOLIC PANEL - Abnormal; Notable for the following components:      Result Value   Glucose, Bld 101 (*)    BUN 90 (*)    Creatinine, Ser 2.68 (*)    Calcium 8.7 (*)    GFR calc non Af Amer 18 (*)    GFR calc Af Amer 20 (*)    All other components within normal limits  CBC - Abnormal; Notable for the following components:   RBC 2.08 (*)    Hemoglobin 6.1 (*)    HCT 20.7 (*)    MCHC 29.5 (*)    RDW 16.7 (*)    nRBC 0.4 (*)    All other components within normal limits  TROPONIN I - Abnormal; Notable for the following components:   Troponin I 1.35 (*)    All other components within normal limits  PROTIME-INR  PREPARE RBC (CROSSMATCH)  TYPE AND SCREEN   ____________________________________________   EKG  Interpreted by me Atrial fibrillation rate of 67, normal axis, left bundle branch  block/LVH with repolarization abnormality.  1 PVC on the strip.  Slight ST depression in lead II and V5 V6.  Repeat EKG interpreted by me Atrial fibrillation rate of 77, normal axis, 1 PVC, slightly worsened ST depression in lead II, V5 and V6.  ____________________________________________    RADIOLOGY  Dg Chest 2 View  Result Date: 02/17/2019 CLINICAL DATA:  Shortness of breath. Nausea. Blurred vision. Hypotension. EXAM: CHEST - 2 VIEW COMPARISON:  Two-view chest x-ray 01/08/2018 FINDINGS: Heart is enlarged. Ectasia of the thoracic aorta is again seen. There is no edema or effusion. No focal airspace disease is present. The visualized soft tissues and bony thorax are unremarkable. IMPRESSION: 1. Cardiomegaly without failure. 2. 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 Patient has a known chronic dissection. Electronically Signed   By: San Morelle M.D.   On: 02/17/2019 18:33    ____________________________________________   PROCEDURES .Critical Care Performed by: Carrie Mew, MD Authorized by: Carrie Mew, MD   Critical care provider statement:    Critical care time (minutes):  35   Critical care time was exclusive of:  Separately billable procedures and treating other patients   Critical care was necessary to treat or prevent imminent or life-threatening deterioration of the following conditions:  Circulatory failure and cardiac failure   Critical care was time spent personally by me on the following activities:  Development of treatment plan with patient or surrogate, discussions with consultants, evaluation of patient's response to treatment, examination of patient, obtaining history from patient or surrogate, ordering and performing treatments and interventions, ordering and review of laboratory studies, ordering and review of radiographic studies, pulse oximetry, re-evaluation of patient's condition and review of  old charts    ____________________________________________  DIFFERENTIAL DIAGNOSIS   GI bleed with acute anemia causing demand ischemia, primary non-STEMI, dehydration  CLINICAL IMPRESSION / ASSESSMENT AND PLAN / ED COURSE  Medications ordered in the ED: Medications  0.9 %  sodium chloride infusion (has no administration in time range)  pantoprazole (PROTONIX) injection 40 mg (has no administration in time range)    Pertinent labs & imaging results that were available during my care of the patient were reviewed by me and considered in my medical decision making (see chart for details).      Clinical Course as of Feb 17 1848  Nancy Fetter Feb 17, 2019  1717 Patient presents with dyspnea on exertion and generalized weakness.  Labs show slight worsening of her CKD compared to previous.  Hemoglobin is 6.1 today compared to a baseline of 9, and exam does show Hemoccult positive black stool without frank melena.  Troponin is 1.35 which is new and I suspect due to demand ischemia from her acute on chronic anemia although possibly from non-STEMI.  EKG shows slight isolated ST depression in lead II without other acute ischemic changes.  Plan to start red blood cell transfusion to mitigate myocardial injury and alleviate symptomatic anemia.  Check INR.  She has a mechanical heart valve from 2004, so complete reversal of anticoagulation is not possible.  She will need to be hospitalized.   [PS]  1802 EMR review: warfarin Indication: PAF, Aortic valve replacement with [employer] valve on 09/19/03   [PS]  1803 Repeat EKG shows   [PS]  1803 Worsened ST depression in the lateral leads, possibly evolving ischemic changes.  Still waiting for INR.  Will discuss with cardiology.  Still no chest pain at present, no shortness of breath at rest.   [PS]  1834 Case discussed with Dr. Rockey Situ who recommends holding anticoagulation for now until bleeding is stopped and hemoglobin is stabilized to alleviate the likely  demand ischemia that is causing cardiac injury.   [PS]    Clinical Course User Index [PS] Carrie Mew, MD     ----------------------------------------- 6:50 PM on 02/17/2019 -----------------------------------------  Case discussed with hospitalist.  ____________________________________________   FINAL CLINICAL IMPRESSION(S) / ED DIAGNOSES    Final diagnoses:  Acute upper GI bleed  Acute blood loss anemia  Demand ischemia of myocardium (HCC)  Elevated troponin     ED Discharge Orders    None      Portions of this note were generated with dragon dictation software. Dictation errors may occur despite best attempts at proofreading.   Carrie Mew, MD 02/17/19 1850

## 2019-02-17 NOTE — ED Notes (Signed)
ED TO INPATIENT HANDOFF REPORT  ED Nurse Name and Phone #: Gershon Mussel RN   [phone]  S Name/Age/Gender Jeanette Romero 68 y.o. female Room/Bed: ED09A/ED09A  Code Status   Code Status: Prior  Home/SNF/Other Home Patient oriented to: self, place, time and situation Is this baseline? Yes   Triage Complete: Triage complete  Chief Complaint Blood Pressure Drop  Triage Note Pt states "I just don't feel good." state gets SOB, nausea, "my sight went blurry so my husband checked my BP and it was low." hx HTN. States vision has returned. Symptoms since Friday. A&O, talking in complete sentences. Moving all extremities. No facial droop noted. No pain. Denies weight gain. Takes warfarin.    Allergies Allergies  Allergen Reactions  . Atorvastatin     Other reaction(s): Liver Disorder Elevated liver enzymes    Level of Care/Admitting Diagnosis ED Disposition    ED Disposition Condition [facility] Area: [place] [[id number]]  Level of Care: Telemetry [5]  Diagnosis: Symptomatic anemia [[id number]]  Admitting Physician: Demetrios Loll [[id number]]  Attending Physician: Demetrios Loll [[id number]]  Estimated length of stay: past midnight tomorrow  Certification:: I certify this patient will need inpatient services for at least 2 midnights  PT Class (Do Not Modify): Inpatient [101]  PT Acc Code (Do Not Modify): Private [1]       B Medical/Surgery History Past Medical History:  Diagnosis Date  . Anemia in chronic kidney disease (CKD) 11/14/2017  . Anxiety   . Aortic valve regurgitation   . Chronic kidney disease   . Coronary artery disease   . Dizziness   . Edema   . GERD (gastroesophageal reflux disease)   . Gout   . Hypercholesterolemia   . Hypertension   . Shortness of breath dyspnea    Past Surgical History:  Procedure Laterality Date  . ABDOMINAL HYSTERECTOMY    . AORTIC VALVE REPLACEMENT    . CARDIAC CATHETERIZATION    . CARDIAC CATHETERIZATION N/A  05/07/2015   Procedure: Left Heart Cath;  Surgeon: [name], MD;  Location: [place] [facility];  Service: Cardiovascular;  Laterality: N/A;  . CARDIAC VALVE REPLACEMENT    . ELECTROPHYSIOLOGIC STUDY N/A 05/05/2015   Procedure: Cardioversion;  Surgeon: [name], MD;  Location: [facility] ORS;  Service: Cardiovascular;  Laterality: N/A;  . REPLACEMENT TOTAL KNEE BILATERAL       A IV Location/Drains/Wounds Patient Lines/Drains/Airways Status   Active Line/Drains/Airways    Name:   Placement date:   Placement time:   Site:   Days:   Peripheral IV 10/05/18 Left Forearm   10/05/18    1354    Forearm   135   Peripheral IV 02/17/19 Right Antecubital   02/17/19    1827    Antecubital   less than 1          Intake/Output Last 24 hours No intake or output data in the 24 hours ending 02/17/19 2047  Labs/Imaging Results for orders placed or performed during the hospital encounter of 02/17/19 (from the past 48 hour(s))  Basic metabolic panel     Status: Abnormal   Collection Time: 02/17/19  3:37 PM  Result Value Ref Range   Sodium 141 135 - 145 mmol/L   Potassium 4.7 3.5 - 5.1 mmol/L   Chloride 111 98 - 111 mmol/L   CO2 22 22 - 32 mmol/L   Glucose, Bld 101 (H) 70 - 99 mg/dL   BUN 90 (  H) 8 - 23 mg/dL   Creatinine, Ser 2.68 (H) 0.44 - 1.00 mg/dL   Calcium 8.7 (L) 8.9 - 10.3 mg/dL   GFR calc non Af Amer 18 (L) >60 mL/min   GFR calc Af Amer 20 (L) >60 mL/min   Anion gap 8 5 - 15    Comment: Performed at [facility], [address] [place], [place] [address]  CBC     Status: Abnormal   Collection Time: 02/17/19  3:37 PM  Result Value Ref Range   WBC 5.6 4.0 - 10.5 K/uL   RBC 2.08 (L) 3.87 - 5.11 MIL/uL   Hemoglobin 6.1 (L) 12.0 - 15.0 g/dL   HCT 20.7 (L) 36.0 - 46.0 %   MCV 99.5 80.0 - 100.0 fL   MCH 29.3 26.0 - 34.0 pg   MCHC 29.5 (L) 30.0 - 36.0 g/dL   RDW 16.7 (H) 11.5 - 15.5 %   Platelets 228 150 - 400 K/uL   nRBC 0.4 (H) 0.0 - 0.2 %    Comment: Performed at  [facility], [address] [place], [place] [address]  Troponin I - ONCE - STAT     Status: Abnormal   Collection Time: 02/17/19  3:37 PM  Result Value Ref Range   Troponin I 1.35 (HH) <0.03 ng/mL    Comment: CRITICAL RESULT CALLED TO, READ BACK BY AND VERIFIED WITH SONJIA WEAVER AT 8295 02/17/2019.PMF Performed at [facility], [address] [place], [place] [address]   Protime-INR     Status: Abnormal   Collection Time: 02/17/19  6:28 PM  Result Value Ref Range   Prothrombin Time 21.8 (H) 11.4 - 15.2 seconds   INR 1.9 (H) 0.8 - 1.2    Comment: (NOTE) INR goal varies based on device and disease states. Performed at [facility], [address] [place], [place] [address]   Prepare RBC     Status: None   Collection Time: 02/17/19  6:28 PM  Result Value Ref Range   Order Confirmation      ORDER PROCESSED BY BLOOD BANK Performed at [facility], [address] [place], [place] [address]   Type and screen [place]     Status: None (Preliminary result)   Collection Time: 02/17/19  6:28 PM  Result Value Ref Range   ABO/RH(D) B POS    Antibody Screen NEG    Sample Expiration 02/20/2019    Unit Number [id number]    Blood Component Type RED CELLS,LR    Unit division 00    Status of Unit ALLOCATED    Transfusion Status OK TO TRANSFUSE    Crossmatch Result Compatible    Unit Number [id number]    Blood Component Type RED CELLS,LR    Unit division 00    Status of Unit ISSUED    Transfusion Status OK TO TRANSFUSE    Crossmatch Result      Compatible Performed at [facility], [address], [place], [place] [address]    Dg Chest 2 View  Result Date: 02/17/2019 CLINICAL DATA:  Shortness of breath. Nausea. Blurred vision. Hypotension. EXAM: CHEST - 2 VIEW COMPARISON:  Two-view chest x-ray 01/08/2018 FINDINGS: Heart is enlarged. Ectasia of the thoracic aorta is again seen. There is no edema or  effusion. No focal airspace disease is present. The visualized soft tissues and bony thorax are unremarkable. IMPRESSION: 1. Cardiomegaly without failure. 2. Ectasias or aneurysmal dilation of the descending thoracic aorta  is similar the prior exam. Patient has a known chronic dissection. Electronically Signed   By: San Morelle M.D.   On: 02/17/2019 18:33    Pending Labs [employer] (From admission, onward)    Start     Ordered   Signed and Held  HIV antibody (Routine Testing)  Once,   R     Signed and Held   Signed and Occupational hygienist morning,   R     Signed and Held   Signed and Held  CBC  Tomorrow morning,   R     Signed and Held   Signed and Held  Troponin I - Now Then Q6H  Now then every 6 hours,   R     Signed and Held          Vitals/Pain Today's Vitals   02/17/19 1534 02/17/19 1535 02/17/19 1730  BP: (!) 107/51  121/88  Pulse: 66  73  Resp: 16  16  Temp: 98 F (36.7 C)  98.1 F (36.7 C)  TempSrc: Oral  Oral  SpO2: 96%  100%  Weight:  78 kg   Height:  5\' 2"  (1.575 m)   PainSc:  0-No pain 0-No pain    Isolation Precautions No active isolations  Medications Medications  0.9 %  sodium chloride infusion (10 mL/hr Intravenous New Bag/Given 02/17/19 1906)  pantoprazole (PROTONIX) injection 40 mg (40 mg Intravenous Given 02/17/19 1929)    Mobility walks Low fall risk   Focused Assessments Cardiac Assessment Handoff:    Lab Results  Component Value Date   TROPONINI 1.35 ([place]) 02/17/2019   No results found for: DDIMER Does the Patient currently have chest pain? No     R Recommendations: See Admitting Provider Note  Report given to:   Additional Notes:

## 2019-02-17 NOTE — ED Notes (Signed)
Admitting Dr at bedside

## 2019-02-17 NOTE — H&P (Signed)
[facility] [place] NAME: Jeanette Romero    MR#:  [id number]  DATE OF BIRTH:  1951/02/22  DATE OF ADMISSION:  02/17/2019  PRIMARY CARE PHYSICIAN: Maryland Pink, MD   REQUESTING/REFERRING PHYSICIAN: Dr. Joni Fears.  CHIEF COMPLAINT:   Chief Complaint  Patient presents with  . Shortness of Breath   Melena for 1 week, shortness of breath on exertion. HISTORY OF PRESENT ILLNESS:  Jeanette Romero  is a 68 y.o. female with a known history of anemia, CKD, anxiety, aortic valve regurgitation, CAD, GERD, gout, hypertension, hyperlipidemia etc.  The patient is sent to ED from home due to above chief complaint.  She has had melena for 1 week, she also complains of dizziness, generalized weakness, shortness of breath on exertion.  She denies any nausea, vomiting, abdominal pain or bloody stool.  She is taking Coumadin for several years without GI bleeding history.  Her hemoglobin decreased from 8.7 last month to 6.1.  ED physician discussed with cardiologist, who suggested hold Coumadin, PRBC transfusion up to 10. PAST MEDICAL HISTORY:   Past Medical History:  Diagnosis Date  . Anemia in chronic kidney disease (CKD) 11/14/2017  . Anxiety   . Aortic valve regurgitation   . Chronic kidney disease   . Coronary artery disease   . Dizziness   . Edema   . GERD (gastroesophageal reflux disease)   . Gout   . Hypercholesterolemia   . Hypertension   . Shortness of breath dyspnea     PAST SURGICAL HISTORY:   Past Surgical History:  Procedure Laterality Date  . ABDOMINAL HYSTERECTOMY    . AORTIC VALVE REPLACEMENT    . CARDIAC CATHETERIZATION    . CARDIAC CATHETERIZATION N/A 05/07/2015   Procedure: Left Heart Cath;  Surgeon: [name], MD;  Location: [facility];  Service: Cardiovascular;  Laterality: N/A;  . CARDIAC VALVE REPLACEMENT    . ELECTROPHYSIOLOGIC STUDY N/A 05/05/2015   Procedure: Cardioversion;  Surgeon: [name], MD;   Location: [facility];  Service: Cardiovascular;  Laterality: N/A;  . REPLACEMENT TOTAL KNEE BILATERAL      SOCIAL HISTORY:   Social History   Tobacco Use  . Smoking status: Former Smoker    Packs/day: 0.50    Years: 20.00    Pack years: 10.00    Types: Cigarettes    Last attempt to quit: 2004    Years since quitting: 16.1  . Smokeless tobacco: Never Used  Substance Use Topics  . Alcohol use: Yes    Alcohol/week: 0.0 standard drinks    Comment: rare    FAMILY HISTORY:   Family History  Problem Relation Age of Onset  . Hypertension Mother   . Colon cancer Mother   . Prostate cancer Father   . Kidney cancer Brother   . Prostate cancer Brother   . Prostate cancer Brother   . Breast cancer Neg Hx     DRUG ALLERGIES:   Allergies  Allergen Reactions  . Atorvastatin     Other reaction(s): Liver Disorder Elevated liver enzymes    REVIEW OF SYSTEMS:   Review of Systems  Constitutional: Positive for malaise/fatigue. Negative for chills and fever.  HENT: Negative for sore throat.   Eyes: Negative for blurred vision and double vision.  Respiratory: Positive for shortness of breath. Negative for cough, hemoptysis, sputum production, wheezing and stridor.   Cardiovascular: Negative for chest pain, palpitations, orthopnea and leg swelling.  Gastrointestinal: Positive for  melena. Negative for abdominal pain, blood in stool, diarrhea, nausea and vomiting.  Genitourinary: Negative for dysuria, flank pain and hematuria.  Musculoskeletal: Negative for back pain and joint pain.  Skin: Negative for rash.  Neurological: Negative for dizziness, sensory change, focal weakness, seizures, loss of consciousness, weakness and headaches.  Endo/Heme/Allergies: Negative for polydipsia.  Psychiatric/Behavioral: Negative for depression. The patient is not nervous/anxious.     MEDICATIONS AT HOME:   Prior to Admission medications   Medication Sig Start Date End Date Taking? Authorizing  Provider  ALPRAZolam (XANAX) 0.25 MG tablet Take 0.25 mg by mouth 2 (two) times daily.   Yes [provider]  amLODipine (NORVASC) 10 MG tablet Take 5 mg by mouth daily.    Yes [provider]  aspirin EC 81 MG tablet Take 81 mg by mouth daily.   Yes [provider]  benazepril (LOTENSIN) 40 MG tablet Take 40 mg by mouth daily.   Yes [provider]  cloNIDine (CATAPRES) 0.3 MG tablet Take 0.3 mg 3 (three) times daily by mouth. 10/14/17  Yes [provider]  folic acid (FOLVITE) 1 MG tablet TAKE 1 TABLET(1 MG) BY MOUTH DAILY 01/02/19  Yes Earlie Server, MD  furosemide (LASIX) 20 MG tablet Take 20-40 mg by mouth daily. Take one tablet on odd days and two tablets on even days 12/14/18  Yes [provider]  hydrALAZINE (APRESOLINE) 100 MG tablet Take 100 mg 3 (three) times daily by mouth.   Yes [provider]  methimazole (TAPAZOLE) 5 MG tablet Take 5 mg by mouth daily.  07/06/18  Yes [provider]  potassium chloride (K-DUR) 10 MEQ tablet Take 10 mEq by mouth daily.  05/04/18 05/04/19 Yes [provider]  rosuvastatin (CRESTOR) 5 MG tablet Take 5 mg by mouth at bedtime. 02/20/18  Yes [provider]  warfarin (COUMADIN) 2 MG tablet Take 4 mg by mouth daily.    Yes [provider]  warfarin (COUMADIN) 4 MG tablet Take 4 mg by mouth daily. 01/13/19  Yes [provider]  amoxicillin (AMOXIL) 500 MG tablet Take 2,000 mg by mouth as needed. 01/17/19   [provider]  SODIUM BICARBONATE, ANTACID, PO Take 10 mg by mouth.    [provider]      VITAL SIGNS:  Blood pressure 121/88, pulse 73, temperature 98.1 F (36.7 C), temperature source Oral, resp. rate 16, height 5\' 2"  (1.575 m), weight 78 kg, SpO2 100 %.  PHYSICAL EXAMINATION:  Physical Exam  GENERAL:  68 y.o.-year-old patient lying in the bed with no acute distress.  EYES: Pupils equal, round, reactive to light and accommodation. No  scleral icterus. Extraocular muscles intact.  HEENT: Head atraumatic, normocephalic. Oropharynx and nasopharynx clear.  NECK:  Supple, no jugular venous distention. No thyroid enlargement, no tenderness.  LUNGS: Normal breath sounds bilaterally, no wheezing, rales,rhonchi or crepitation. No use of accessory muscles of respiration.  CARDIOVASCULAR: S1, S2 normal. No murmurs, rubs, or gallops.  ABDOMEN: Soft, nontender, nondistended. Bowel sounds present. No organomegaly or mass.  EXTREMITIES: No pedal edema, cyanosis, or clubbing.  NEUROLOGIC: Cranial nerves II through XII are intact. Muscle strength 5/5 in all extremities. Sensation intact. Gait not checked.  PSYCHIATRIC: The patient is alert and oriented x 3.  SKIN: No obvious rash, lesion, or ulcer.   LABORATORY PANEL:   CBC Recent Labs  Lab 02/17/19 1537  WBC 5.6  HGB 6.1*  HCT 20.7*  PLT 228   ------------------------------------------------------------------------------------------------------------------  Chemistries  Recent Labs  Lab 02/17/19 1537  NA 141  K 4.7  CL 111  CO2 22  GLUCOSE 101*  BUN 90*  CREATININE 2.68*  CALCIUM 8.7*   ------------------------------------------------------------------------------------------------------------------  Cardiac Enzymes Recent Labs  Lab 02/17/19 1537  TROPONINI 1.35*   ------------------------------------------------------------------------------------------------------------------  RADIOLOGY:  Dg Chest 2 View  Result Date: 02/17/2019 CLINICAL DATA:  Shortness of breath. Nausea. Blurred vision. Hypotension. EXAM: CHEST - 2 VIEW COMPARISON:  Two-view chest x-ray 01/08/2018 FINDINGS: Heart is enlarged. Ectasia of the thoracic aorta is again seen. There is no edema or effusion. No focal airspace disease is present. The visualized soft tissues and bony thorax are unremarkable. IMPRESSION: 1. Cardiomegaly without failure. 2. 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 Patient has a known chronic dissection. Electronically Signed   By: San Morelle M.D.   On: 02/17/2019 18:33      IMPRESSION AND PLAN:   Symptomatic anemia due to GI bleeding. The patient will be admitted to medical floor with telemetry monitor. Hold Coumadin, start Protonix IV twice daily, PRBC transfusion 2 unit, follow-up hemoglobin.  Follow-up GI and oncology consult.  Acute renal failure on CKD stage III.  Hold Lasix and losartan, IV fluid support and nephrology consult.  Follow-up BMP.  Elevated troponin, possible demanding ischemia secondary to above.  Follow-up troponin level and cardiology consult.  Hold Coumadin due to GI bleeding and symptomatic anemia.  S/p aortic valve surgery.  Hold Coumadin for now. Hypertension.  Hold hypertension medication due to low side blood pressure.  All the records are reviewed and case discussed with ED provider. Management plans discussed with the patient, her daughter and brother and they are in agreement.  CODE STATUS: Full code  TOTAL TIME TAKING CARE OF THIS PATIENT: 38  minutes.    Demetrios Loll M.D on 02/17/2019 at 6:58 PM  Between 7am to 6pm - Pager - [phone]  After 6pm go to [email or web address] - Proofreader  [facility]  Office  [phone]  CC: Primary care physician; Maryland Pink, MD   Note: This dictation was prepared with Dragon dictation along with smaller phrase technology. Any transcriptional errors that result from this process are unin

## 2019-02-17 NOTE — ED Triage Notes (Signed)
Pt states "I just don't feel good." state gets SOB, nausea, "my sight went blurry so my husband checked my BP and it was low." hx HTN. States vision has returned. Symptoms since Friday. A&O, talking in complete sentences. Moving all extremities. No facial droop noted. No pain. Denies weight gain. Takes warfarin.

## 2019-02-18 ENCOUNTER — Other Ambulatory Visit: Payer: Self-pay

## 2019-02-18 ENCOUNTER — Inpatient Hospital Stay: Payer: Medicare Other | Attending: Oncology

## 2019-02-18 ENCOUNTER — Inpatient Hospital Stay: Payer: Medicare Other

## 2019-02-18 DIAGNOSIS — N184 Chronic kidney disease, stage 4 (severe): Secondary | ICD-10-CM | POA: Insufficient documentation

## 2019-02-18 DIAGNOSIS — D631 Anemia in chronic kidney disease: Secondary | ICD-10-CM

## 2019-02-18 DIAGNOSIS — D649 Anemia, unspecified: Secondary | ICD-10-CM

## 2019-02-18 DIAGNOSIS — D62 Acute posthemorrhagic anemia: Secondary | ICD-10-CM

## 2019-02-18 DIAGNOSIS — I248 Other forms of acute ischemic heart disease: Secondary | ICD-10-CM

## 2019-02-18 LAB — IRON AND TIBC
Iron: 74 ug/dL (ref 28–170)
Saturation Ratios: 23 % (ref 10.4–31.8)
TIBC: 325 ug/dL (ref 250–450)
UIBC: 251 ug/dL

## 2019-02-18 LAB — BASIC METABOLIC PANEL
Anion gap: 10 (ref 5–15)
BUN: 84 mg/dL — ABNORMAL HIGH (ref 8–23)
CO2: 19 mmol/L — ABNORMAL LOW (ref 22–32)
Calcium: 9.3 mg/dL (ref 8.9–10.3)
Chloride: 112 mmol/L — ABNORMAL HIGH (ref 98–111)
Creatinine, Ser: 2.36 mg/dL — ABNORMAL HIGH (ref 0.44–1.00)
GFR calc Af Amer: 24 mL/min — ABNORMAL LOW (ref >60)
GFR, EST NON AFRICAN AMERICAN: 20 mL/min — AB (ref >60)
GLUCOSE: 92 mg/dL (ref 70–99)
Potassium: 4.4 mmol/L (ref 3.5–5.1)
Sodium: 141 mmol/L (ref 135–145)

## 2019-02-18 LAB — TROPONIN I
Troponin I: 1.35 ng/mL (ref <0.03)
Troponin I: 1.47 ng/mL (ref <0.03)

## 2019-02-18 LAB — TSH: TSH: 1.421 u[IU]/mL (ref 0.350–4.500)

## 2019-02-18 LAB — RETIC PANEL
Immature Retic Fract: 32.6 % — ABNORMAL HIGH (ref 2.3–15.9)
RBC.: 2.76 MIL/uL — ABNORMAL LOW (ref 3.87–5.11)
Retic Count, Absolute: 151.5 10*3/uL (ref 19.0–186.0)
Retic Ct Pct: 5.5 % — ABNORMAL HIGH (ref 0.4–3.1)
Reticulocyte Hemoglobin: 32.1 pg (ref >27.9)

## 2019-02-18 LAB — CBC
HCT: 26.5 % — ABNORMAL LOW (ref 36.0–46.0)
Hemoglobin: 8 g/dL — ABNORMAL LOW (ref 12.0–15.0)
MCH: 29 pg (ref 26.0–34.0)
MCHC: 30.2 g/dL (ref 30.0–36.0)
MCV: 96 fL (ref 80.0–100.0)
Platelets: 226 10*3/uL (ref 150–400)
RBC: 2.76 MIL/uL — ABNORMAL LOW (ref 3.87–5.11)
RDW: 16 % — ABNORMAL HIGH (ref 11.5–15.5)
WBC: 7.7 10*3/uL (ref 4.0–10.5)
nRBC: 0.4 % — ABNORMAL HIGH (ref 0.0–0.2)

## 2019-02-18 LAB — FERRITIN: Ferritin: 125 ng/mL (ref 11–307)

## 2019-02-18 LAB — T4, FREE: Free T4: 0.83 ng/dL (ref 0.82–1.77)

## 2019-02-18 MED ORDER — BISACODYL 5 MG PO TBEC
5.0000 mg | DELAYED_RELEASE_TABLET | Freq: Every day | ORAL | Status: DC | PRN
  Filled 2019-02-18 (×2): qty 1

## 2019-02-18 MED ORDER — ACETAMINOPHEN 650 MG RE SUPP: 650.0000 mg | Freq: Four times a day (QID) | RECTAL | Status: DC | PRN

## 2019-02-18 MED ORDER — SENNOSIDES-DOCUSATE SODIUM 8.6-50 MG PO TABS: 1.0000 | ORAL_TABLET | Freq: Every evening | ORAL | Status: DC | PRN

## 2019-02-18 MED ORDER — DIPHENHYDRAMINE HCL 25 MG PO CAPS
50.0000 mg | ORAL_CAPSULE | Freq: Every evening | ORAL | Status: DC | PRN
  Administered 2019-02-18: 50 mg via ORAL
  Filled 2019-02-18: qty 2

## 2019-02-18 MED ORDER — METHIMAZOLE 5 MG PO TABS
5.0000 mg | ORAL_TABLET | Freq: Every day | ORAL | Status: DC
  Administered 2019-02-18 – 2019-02-22 (×5): 5 mg via ORAL
  Filled 2019-02-18 (×5): qty 1

## 2019-02-18 MED ORDER — ADULT MULTIVITAMIN W/MINERALS CH
1.0000 | ORAL_TABLET | Freq: Every day | ORAL | Status: DC
  Administered 2019-02-18 – 2019-02-22 (×5): 1 via ORAL
  Filled 2019-02-18 (×5): qty 1

## 2019-02-18 MED ORDER — BOOST / RESOURCE BREEZE PO LIQD CUSTOM
1.0000 | Freq: Three times a day (TID) | ORAL | Status: DC
  Administered 2019-02-18: 1 via ORAL

## 2019-02-18 MED ORDER — ONDANSETRON HCL 4 MG/2ML IJ SOLN
4.0000 mg | Freq: Four times a day (QID) | INTRAMUSCULAR | Status: DC | PRN
  Administered 2019-02-19: 4 mg via INTRAVENOUS
  Filled 2019-02-18: qty 2

## 2019-02-18 MED ORDER — PANTOPRAZOLE SODIUM 40 MG IV SOLR
40.0000 mg | Freq: Two times a day (BID) | INTRAVENOUS | Status: DC
  Administered 2019-02-18 – 2019-02-19 (×3): 40 mg via INTRAVENOUS
  Filled 2019-02-18 (×3): qty 40

## 2019-02-18 MED ORDER — SODIUM CHLORIDE 0.9 % IV SOLN
INTRAVENOUS | Status: DC
  Administered 2019-02-18 (×2): via INTRAVENOUS

## 2019-02-18 MED ORDER — ACETAMINOPHEN 325 MG PO TABS
650.0000 mg | ORAL_TABLET | Freq: Four times a day (QID) | ORAL | Status: DC | PRN
  Administered 2019-02-18: 650 mg via ORAL
  Filled 2019-02-18: qty 2

## 2019-02-18 MED ORDER — ALPRAZOLAM 0.25 MG PO TABS
0.2500 mg | ORAL_TABLET | Freq: Two times a day (BID) | ORAL | Status: DC
  Administered 2019-02-18 – 2019-02-22 (×10): 0.25 mg via ORAL
  Filled 2019-02-18 (×10): qty 1

## 2019-02-18 MED ORDER — HYDROCODONE-ACETAMINOPHEN 5-325 MG PO TABS
1.0000 | ORAL_TABLET | ORAL | Status: DC | PRN
  Administered 2019-02-19: 2 via ORAL
  Filled 2019-02-18: qty 2

## 2019-02-18 MED ORDER — FOLIC ACID 1 MG PO TABS
1.0000 mg | ORAL_TABLET | Freq: Every day | ORAL | Status: DC
  Administered 2019-02-18 – 2019-02-22 (×5): 1 mg via ORAL
  Filled 2019-02-18 (×5): qty 1

## 2019-02-18 MED ORDER — ALBUTEROL SULFATE (2.5 MG/3ML) 0.083% IN NEBU: 2.5000 mg | INHALATION_SOLUTION | RESPIRATORY_TRACT | Status: DC | PRN

## 2019-02-18 MED ORDER — ROSUVASTATIN CALCIUM 5 MG PO TABS
5.0000 mg | ORAL_TABLET | Freq: Every day | ORAL | Status: DC
  Administered 2019-02-18 – 2019-02-21 (×3): 5 mg via ORAL
  Filled 2019-02-18 (×4): qty 1

## 2019-02-18 MED ORDER — ONDANSETRON HCL 4 MG PO TABS: 4.0000 mg | ORAL_TABLET | Freq: Four times a day (QID) | ORAL | Status: DC | PRN

## 2019-02-18 NOTE — Plan of Care (Signed)
  Problem: Activity: Goal: Will identify at least one activity in which they can participate Outcome: Progressing   Problem: Coping: Goal: Ability to identify and develop effective coping behavior will improve Outcome: Progressing   Problem: Education: Goal: Ability to identify signs and symptoms of gastrointestinal bleeding will improve Outcome: Progressing

## 2019-02-18 NOTE — Consult Note (Signed)
Vonda Antigua, MD [address] [place], [place], [place], [address] [address], [place], [place], [place], [address] Phone: [phone]  Fax: [phone]  Consultation  Referring Provider:     Dr. Bridgett Larsson Primary Care Physician:  Maryland Pink, MD Reason for Consultation:     Melena, anemia  Date of Admission:  02/17/2019 Date of Consultation:  02/18/2019         HPI:   Jeanette Romero is a 68 y.o. female presented to the hospital with dizziness, weakness, and melanotic stool for 1 week, found to be anemic with hemoglobin around 5.  Last episode of melena was 2 days ago.  No further bowel movement since then.  No nausea or vomiting or abdominal pain.  Denies any NSAID use besides aspirin 81 mg daily.  Is also on Coumadin at home with INR of 1.9 on presentation.  No active GI bleeding since presentation.  Patient also noted to have an elevated troponin of 1.35 on admission, increased to 1.47 today.  Patient was recently evaluated by Dr. Alice Reichert for screening colonoscopy, but cardiology clearance was requested due to "significant unremitting atrial fibrillation that is been recurrent despite 3 attempts of cardioversion".  Therefore colonoscopy was not scheduled yet.  Patient reports last colonoscopy was about 7 years ago, and when I do not have the records of this.  Patient states it was normal.  Patient also reports family history of colon cancer in her mother at 22 years of age.  No prior EGDs.  Patient denies any dysphagia or heartburn.  Past Medical History:  Diagnosis Date  . Anemia in chronic kidney disease (CKD) 11/14/2017  . Anxiety   . Aortic valve regurgitation   . Chronic kidney disease   . Coronary artery disease   . Dizziness   . Edema   . GERD (gastroesophageal reflux disease)   . Gout   . Hypercholesterolemia   . Hypertension   . Shortness of breath dyspnea     Past Surgical History:  Procedure Laterality Date  . ABDOMINAL HYSTERECTOMY    . AORTIC VALVE  REPLACEMENT    . CARDIAC CATHETERIZATION    . CARDIAC CATHETERIZATION N/A 05/07/2015   Procedure: Left Heart Cath;  Surgeon: [name], MD;  Location: [facility];  Service: Cardiovascular;  Laterality: N/A;  . CARDIAC VALVE REPLACEMENT    . ELECTROPHYSIOLOGIC STUDY N/A 05/05/2015   Procedure: Cardioversion;  Surgeon: [name], MD;  Location: [facility];  Service: Cardiovascular;  Laterality: N/A;  . REPLACEMENT TOTAL KNEE BILATERAL      Prior to Admission medications   Medication Sig Start Date End Date Taking? Authorizing Provider  ALPRAZolam (XANAX) 0.25 MG tablet Take 0.25 mg by mouth 2 (two) times daily.   Yes [provider]  amLODipine (NORVASC) 10 MG tablet Take 5 mg by mouth daily.    Yes [provider]  aspirin EC 81 MG tablet Take 81 mg by mouth daily.   Yes [provider]  benazepril (LOTENSIN) 40 MG tablet Take 40 mg by mouth daily.   Yes [provider]  cloNIDine (CATAPRES) 0.3 MG tablet Take 0.3 mg 3 (three) times daily by mouth. 10/14/17  Yes [provider]  folic acid (FOLVITE) 1 MG tablet TAKE 1 TABLET(1 MG) BY MOUTH DAILY 01/02/19  Yes Earlie Server, MD  furosemide (LASIX) 20 MG tablet Take 20-40 mg by mouth daily. Take one tablet on odd days and two tablets on even days  12/14/18  Yes [provider]  hydrALAZINE (APRESOLINE) 100 MG tablet Take 100 mg 3 (three) times daily by mouth.   Yes [provider]  methimazole (TAPAZOLE) 5 MG tablet Take 5 mg by mouth daily.  07/06/18  Yes [provider]  potassium chloride (K-DUR) 10 MEQ tablet Take 10 mEq by mouth daily.  05/04/18 05/04/19 Yes [provider]  rosuvastatin (CRESTOR) 5 MG tablet Take 5 mg by mouth at bedtime. 02/20/18  Yes [provider]  warfarin (COUMADIN) 2 MG tablet Take 4 mg by mouth daily.    Yes [provider]  warfarin (COUMADIN) 4 MG tablet Take 4 mg by mouth daily. 01/13/19  Yes [provider]  amoxicillin (AMOXIL) 500 MG tablet Take 2,000 mg by mouth as needed. 01/17/19   [provider]  SODIUM BICARBONATE, ANTACID, PO Take 10 mg by mouth.    [provider]    Family History  Problem Relation Age of Onset  . Hypertension Mother   . Colon cancer Mother   . Prostate cancer Father   . Kidney cancer Brother   . Prostate cancer Brother   . Prostate cancer Brother   . Breast cancer Neg Hx      Social History   Tobacco Use  . Smoking status: Former Smoker    Packs/day: 0.50    Years: 20.00    Pack years: 10.00    Types: Cigarettes    Last attempt to quit: 2004    Years since quitting: 16.1  . Smokeless tobacco: Never Used  Substance Use Topics  . Alcohol use: Yes    Alcohol/week: 0.0 standard drinks    Comment: rare  . Drug use: No    Allergies as of 02/17/2019 - Review Complete 02/17/2019  Allergen Reaction Noted  . Atorvastatin  01/04/2018    Review of Systems:    All systems reviewed and negative except where noted in HPI.   Physical Exam:  Vital signs in last 24 hours: Vitals:   02/18/19 0245 02/18/19 0311 02/18/19 0313 02/18/19 0741  BP: 129/60 (!) 133/119 134/71 (!) 142/93  Pulse: 69 85 76 91  Resp: 18     Temp: 98.9 F (37.2 C) 98.4 F (36.9 C)  98.5 F (36.9 C)  TempSrc: Oral Oral  Oral  SpO2: 97% 100% 100% 93%  Weight:      Height:       Last BM Date: 02/16/19 General:   Pleasant, cooperative in NAD Head:  Normocephalic and atraumatic. Eyes:   No icterus.   Conjunctiva pink. PERRLA. Ears:  Normal auditory acuity. Neck:  Supple; no masses or thyroidomegaly Lungs: Respirations even and unlabored. Lungs clear to auscultation bilaterally.   No wheezes, crackles, or rhonchi.  Abdomen:  Soft, nondistended, nontender. Normal bowel sounds. No appreciable masses or hepatomegaly.  No rebound or guarding.  Neurologic:  Alert and oriented x3;  grossly normal neurologically. Skin:  Intact without significant lesions or  rashes. Cervical Nodes:  No significant cervical adenopathy. Psych:  Alert and cooperative. Normal affect.  LAB RESULTS: Recent Labs    02/17/19 1537 02/18/19 0550  WBC 5.6 7.7  HGB 6.1* 8.0*  HCT 20.7* 26.5*  PLT 228 226   BMET Recent Labs    02/17/19 1537 02/18/19 0550  NA 141 141  K 4.7 4.4  CL 111 112*  CO2 22 19*  GLUCOSE 101* 92  BUN 90* 84*  CREATININE 2.68* 2.36*  CALCIUM 8.7* 9.3   LFT  No results for input(s): PROT, ALBUMIN, AST, ALT, ALKPHOS, BILITOT, BILIDIR, IBILI in the last 72 hours. PT/INR Recent Labs    02/17/19 1828  LABPROT 21.8*  INR 1.9*    STUDIES: Dg Chest 2 View  Result Date: 02/17/2019 CLINICAL DATA:  Shortness of breath. Nausea. Blurred vision. Hypotension. EXAM: CHEST - 2 VIEW COMPARISON:  Two-view chest x-ray 01/08/2018 FINDINGS: Heart is enlarged. Ectasia of the thoracic aorta is again seen. There is no edema or effusion. No focal airspace disease is present. The visualized soft tissues and bony thorax are unremarkable. IMPRESSION: 1. Cardiomegaly without failure. 2. 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 Patient has a known chronic dissection. Electronically Signed   By: San Morelle M.D.   On: 02/17/2019 18:33      Impression / Plan:   Jeanette Romero is a 68 y.o. y/o female with Coumadin use at home, presents with 1 week history of melena and anemia, with elevated troponin on this admission.  Source of melena and anemia could be peptic ulcer disease, AVMs, esophagitis, or other underlying lesions However, patient is at very high risk for endoscopic procedures at this time given her elevated troponin  In addition, last cardiology note also reports that they were considering AAD versus ablation.  Given the above cardiac risks, and no active GI bleeding for last 2 days, with good response to packed red blood cells, with hemoglobin of 8 this morning, risks of procedure at  this time would outweigh benefits  Clinically it appears that GI bleed has resolved with medical intervention with Protonix  Patient will need endoscopy once she is medically optimized, further out from her recent elevated troponin and cardiac clearance is obtained to proceed with the procedure in the near future.  Would recommend further cardiac work-up  In the meantime, an upper GI study can be done which would allow for evaluation of any large gastric ulcers without putting the patient to risk of sedation.  However, if she starts having active GI bleeding, depending on the rate of bleeding, primary team to please order RBC scan or CTA as appropriate at the time to evaluate source of bleeding.  PPI IV twice daily  Continue serial CBCs and transfuse PRN Avoid NSAIDs Maintain 2 large-bore IV lines Please page GI with any acute hemodynamic changes, or signs of active GI bleeding   Thank you for involving me in the care of this patient.      LOS: 1 day   Virgel Manifold, MD  02/18/2019, 8:44 AM

## 2019-02-18 NOTE — Consult Note (Signed)
Hematology/Oncology Consult note [facility] [phone] [phone]  Patient Care Team: [place] Pink, MD as PCP - General (Family Medicine)   Name of the patient: Jeanette Romero  [id number]  06/11/1951   Date of visit: 02/18/19 REASON FOR COSULTATION:  Anemia History of presenting illness-  68 y.o. female with PMH listed at below who presents to ER for evaluation of profound fatigue and weakness, dizziness and shortness of breath.. Also she recalls having melena 1 week ago.  No bright blood in the stool.  Patient is on chronic anticoagulation with Coumadin for heart disease.  No previous history of acute GI bleeding.  She follows up with me for management of anemia secondary to chronic kidney disease, on Procrit 60,000 unit every 4 weeks..  Patient has functional iron deficiency, I have previously advised patient to establish care with gastroenterology to work-up for possible GI bleeding.  She was waiting for cardiology procedure before proceeding with colonoscopy. At presentation, patient has a hemoglobin 6.1, status post 2 unit of PRBC transfusion.  Hemoglobin came up to 8.0. Patient has seen by gastroenterology.  Given patient's significant cardiac risks, and no active bleeding for the past 2 days, GI feels that risk of procedure at that time overweight benefits.  She will need to proceed with endoscopy once she is medically optimized and cleared by cardiology. Patient had upper GI study done today which showed no significant ulcer disease is identified.  Mild changes of gastritis.  Functional narrowing in the distal esophagus related to cardiac enlargement.  Tortuous thoracic aorta causing intrinsic compression.  Patient is lying in bed.  Reports feeling slightly better since admission.  Still feels very weak.  Shortness of breath with exertion.  Review of Systems  Constitutional: Positive for fatigue. Negative for appetite change, chills and  fever.  HENT:   Negative for hearing loss and voice change.   Eyes: Negative for eye problems.  Respiratory: Positive for shortness of breath. Negative for chest tightness and cough.   Cardiovascular: Negative for chest pain.  Gastrointestinal: Positive for blood in stool. Negative for abdominal distention and abdominal pain.  Endocrine: Negative for hot flashes.  Genitourinary: Negative for difficulty urinating and frequency.   Musculoskeletal: Negative for arthralgias.  Skin: Negative for itching and rash.  Neurological: Negative for extremity weakness.  Hematological: Negative for adenopathy.  Psychiatric/Behavioral: Negative for confusion.    Allergies  Allergen Reactions  . Atorvastatin     Other reaction(s): Liver Disorder Elevated liver enzymes    Patient Active Problem List   Diagnosis Date Noted  . Symptomatic anemia 02/17/2019  . Anemia in chronic kidney disease (CKD) 11/14/2017  . Normocytic anemia 10/31/2017  . Fatigue associated with anemia 10/31/2017  . Depression 10/31/2017  . Hyperthyroidism 10/31/2017  . Chronic anticoagulation 10/31/2017  . Bradycardia 05/05/2015     Past Medical History:  Diagnosis Date  . Anemia in chronic kidney disease (CKD) 11/14/2017  . Anxiety   . Aortic valve regurgitation   . Chronic kidney disease   . Coronary artery disease   . Dizziness   . Edema   . GERD (gastroesophageal reflux disease)   . Gout   . Hypercholesterolemia   . Hypertension   . Shortness of breath dyspnea      Past Surgical History:  Procedure Laterality Date  . ABDOMINAL HYSTERECTOMY    . AORTIC VALVE REPLACEMENT    . CARDIAC CATHETERIZATION    . CARDIAC CATHETERIZATION N/A 05/07/2015   Procedure: Left Heart  Cath;  Surgeon: [name], MD;  Location: [facility];  Service: Cardiovascular;  Laterality: N/A;  . CARDIAC VALVE REPLACEMENT    . ELECTROPHYSIOLOGIC STUDY N/A 05/05/2015   Procedure: Cardioversion;  Surgeon: [name],  MD;  Location: [facility];  Service: Cardiovascular;  Laterality: N/A;  . REPLACEMENT TOTAL KNEE BILATERAL      Social History   Socioeconomic History  . Marital status: Married    Spouse name: Not on file  . Number of children: Not on file  . Years of education: Not on file  . Highest education level: Not on file  Occupational History  . Not on file  Social Needs  . Financial resource strain: Not on file  . Food insecurity:    Worry: Not on file    Inability: Not on file  . Transportation needs:    Medical: Not on file    Non-medical: Not on file  Tobacco Use  . Smoking status: Former Smoker    Packs/day: 0.50    Years: 20.00    Pack years: 10.00    Types: Cigarettes    Last attempt to quit: 2004    Years since quitting: 16.1  . Smokeless tobacco: Never Used  Substance and Sexual Activity  . Alcohol use: Yes    Alcohol/week: 0.0 standard drinks    Comment: rare  . Drug use: No  . Sexual activity: Not on file  Lifestyle  . Physical activity:    Days per week: Not on file    Minutes per session: Not on file  . Stress: Not on file  Relationships  . Social connections:    Talks on phone: Not on file    Gets together: Not on file    Attends religious service: Not on file    Active member of club or organization: Not on file    Attends meetings of clubs or organizations: Not on file    Relationship status: Not on file  . Intimate partner violence:    Fear of current or ex partner: Not on file    Emotionally abused: Not on file    Physically abused: Not on file    Forced sexual activity: Not on file  Other Topics Concern  . Not on file  Social History Narrative  . Not on file     Family History  Problem Relation Age of Onset  . Hypertension Mother   . Colon cancer Mother   . Prostate cancer Father   . Kidney cancer Brother   . Prostate cancer Brother   . Prostate cancer Brother   . Breast cancer Neg Hx      Current Facility-Administered Medications:    .  0.9 %  sodium chloride infusion, , Intravenous, Continuous, Demetrios Loll, MD, Last Rate: 50 mL/hr at 02/18/19 0253 .  acetaminophen (TYLENOL) tablet 650 mg, 650 mg, Oral, Q6H PRN **OR** acetaminophen (TYLENOL) suppository 650 mg, 650 mg, Rectal, Q6H PRN, Demetrios Loll, MD .  albuterol (PROVENTIL) (2.5 MG/3ML) 0.083% nebulizer solution 2.5 mg, 2.5 mg, Nebulization, Q2H PRN, Demetrios Loll, MD .  ALPRAZolam Duanne Moron) tablet 0.25 mg, 0.25 mg, Oral, BID, Demetrios Loll, MD, 0.25 mg at 02/18/19 1051 .  bisacodyl (DULCOLAX) EC tablet 5 mg, 5 mg, Oral, Daily PRN, Demetrios Loll, MD .  folic acid (FOLVITE) tablet 1 mg, 1 mg, Oral, Daily, Demetrios Loll, MD, 1 mg at 02/18/19 1052 .  HYDROcodone-acetaminophen (NORCO/VICODIN) 5-325 MG per tablet 1-2 tablet, 1-2 tablet, Oral, Q4H  PRN, Demetrios Loll, MD .  methimazole (TAPAZOLE) tablet 5 mg, 5 mg, Oral, Daily, Demetrios Loll, MD, 5 mg at 02/18/19 1052 .  ondansetron (ZOFRAN) tablet 4 mg, 4 mg, Oral, Q6H PRN **OR** ondansetron (ZOFRAN) injection 4 mg, 4 mg, Intravenous, Q6H PRN, Demetrios Loll, MD .  pantoprazole (PROTONIX) injection 40 mg, 40 mg, Intravenous, Q12H, Demetrios Loll, MD, 40 mg at 02/18/19 1051 .  rosuvastatin (CRESTOR) tablet 5 mg, 5 mg, Oral, QHS, Demetrios Loll, MD .  senna-docusate (Senokot-S) tablet 1 tablet, 1 tablet, Oral, QHS PRN, Demetrios Loll, MD   Physical exam: Vitals:   02/18/19 0245 02/18/19 0311 02/18/19 0313 02/18/19 0741  BP: 129/60 (!) 133/119 134/71 (!) 142/93  Pulse: 69 85 76 91  Resp: 18     Temp: 98.9 F (37.2 C) 98.4 F (36.9 C)  98.5 F (36.9 C)  TempSrc: Oral Oral  Oral  SpO2: 97% 100% 100% 93%  Weight:      Height:       Physical Exam  Constitutional: She is oriented to person, place, and time. No distress.  HENT:  Head: Normocephalic and atraumatic.  Nose: Nose normal.  Mouth/Throat: Oropharynx is clear and moist. No oropharyngeal exudate.  Eyes: Pupils are equal, round, and reactive to light. EOM are normal. No scleral icterus.  Neck: Normal  range of motion. Neck supple.  Cardiovascular: Normal rate and regular rhythm.  Murmur heard. Pulmonary/Chest: Effort normal. No respiratory distress. She has no rales. She exhibits no tenderness.  Abdominal: Soft. She exhibits no distension. There is no abdominal tenderness.  Musculoskeletal: Normal range of motion.        General: No edema.  Neurological: She is alert and oriented to person, place, and time. No cranial nerve deficit. She exhibits normal muscle tone. Coordination normal.  Skin: Skin is warm and dry. She is not diaphoretic. No erythema. There is pallor.  Psychiatric: Affect normal.        CMP Latest Ref Rng & Units 02/18/2019  Glucose 70 - 99 mg/dL 92  BUN 8 - 23 mg/dL 84(H)  Creatinine 0.44 - 1.00 mg/dL 2.36(H)  Sodium 135 - 145 mmol/L 141  Potassium 3.5 - 5.1 mmol/L 4.4  Chloride 98 - 111 mmol/L 112(H)  CO2 22 - 32 mmol/L 19(L)  Calcium 8.9 - 10.3 mg/dL 9.3  Total Protein 6.5 - 8.1 g/dL -  Total Bilirubin 0.3 - 1.2 mg/dL -  Alkaline Phos 38 - 126 U/L -  AST 15 - 41 U/L -  ALT 0 - 44 U/L -   CBC Latest Ref Rng & Units 02/18/2019  WBC 4.0 - 10.5 K/uL 7.7  Hemoglobin 12.0 - 15.0 g/dL 8.0(L)  Hematocrit 36.0 - 46.0 % 26.5(L)  Platelets 150 - 400 K/uL 226   RADIOGRAPHIC STUDIES: I have personally reviewed the radiological images as listed and agreed with the findings in the report.  Dg Chest 2 View  Result Date: 02/17/2019 CLINICAL DATA:  Shortness of breath. Nausea. Blurred vision. Hypotension. EXAM: CHEST - 2 VIEW COMPARISON:  Two-view chest x-ray 01/08/2018 FINDINGS: Heart is enlarged. Ectasia of the thoracic aorta is again seen. There is no edema or effusion. No focal airspace disease is present. The visualized soft tissues and bony thorax are unremarkable. IMPRESSION: 1. Cardiomegaly without failure. 2. 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 Patient has a known chronic dissection. Electronically Signed   By:  San Morelle M.D.   On: 02/17/2019 18:33  Dg Ugi W Double Cm (hd Ba)  Result Date: 02/18/2019 CLINICAL DATA:  Black dark stools EXAM: UPPER GI SERIES WITHOUT KUB TECHNIQUE: Routine upper GI series was performed with high density barium. Effervescent crystals were utilized as well. FLUOROSCOPY TIME:  Fluoroscopy Time:  5 minutes 36 seconds Radiation Exposure Index (if provided by the fluoroscopic device): 190.2 mGy Number of Acquired Spot Images: 17, multiple cine fluoroscopic runs COMPARISON:  None. FINDINGS: Swallowing mechanism is within normal limits. No laryngeal perforation or tracheal aspiration is seen. The esophageal mucosa is unremarkable. Mild extrinsic compression upon the esophagus is noted at the level of the cardiac shadow in part due to atrial enlargement as well as tortuosity of the thoracic aorta. This creates a functional narrowing. The stomach distends well and demonstrates no focal gastric ulcer. Some mild irregularity of the stomach mucosa is seen likely related to mild gastritis. Duodenal bulb and duodenal sweep demonstrate no evidence of focal ulcer. IMPRESSION: No significant ulcer disease is identified. Mild changes of gastritis. Functional narrowing in the distal esophagus related to cardiac enlargement and tortuous thoracic aorta causing extrinsic compression. Electronically Signed   By: Inez Catalina M.D.   On: 02/18/2019 10:59    Assessment and plan- Patient is a 68 y.o. female with past medical history significant for anemia of chronic kidney disease, aortic valve regurgitation, CAD, atrial fibrillation, GERD, gout, hypertension, hyperlipidemia, chronic anticoagulation, currently admitted due to melena, acute on chronic anemia status post PRBC transfusion.  Hemoglobin responded appropriately.  #Acute on chronic anemia, likely secondary to GI blood loss. Patient has been seen by gastroenterology in the recommend proceeding with endoscopy once patient is medically  optimized.  Need cardiac clearance. Continue Protonix. Status post 2 unit of PRBC transfusion.  Globin improves appropriately. Coumadin has been held for now.  Appreciate cardiology input. Anemia of chronic kidney disease, patient has been on Procrit 60,000 units every 4 weeks. I would check reticulocyte panel, iron TIBC, ferritin level since she has significant blood loss from GI tract. If low iron store, will proceed with IV iron treatments. We will continue to follow outpatient during her inpatient course.   Thank you for allowing me to participate in the care of this patient.  Total face to face encounter time for this patient visit was 70 min. >50% of the time was  spent in counseling and coordination of care.    Earlie Server, MD, PhD Hematology Oncology [facility] at [facility] Pager- [phone] 02/18/2019

## 2019-02-18 NOTE — Consult Note (Signed)
Date: 02/18/2019                  Patient Name:  Jeanette Romero  MRN: [id number]  DOB: [birth year]  Age / Sex: 68 y.o., female         PCP: Maryland Pink, MD                 Service Requesting Consult: IM/ Gladstone Lighter, MD                 Reason for Consult: ARF            History of Present Illness: Patient is a 68 y.o. female with medical problems of severe CAD, Aortic insufficiency, AVR, CKD,chronic aortic dissection, Fem-fem bypass, rt axillofemoral bypass , who was admitted to [facility] on 02/17/2019 for evaluation of SOB, Nausea, blurred vision, hypotension. Found to have sever anemia Patient known to our practice from 2015.  She now follows with nephrologist in [place]. She has CKD with baseline creatinine of 2.08 from 06/2018 Admit Cr 2.68 which has improved to 2.36 Today, she states she feels little better.  Able to eat without nausea or vomiting.   Medications: Outpatient medications: Medications Prior to Admission  Medication Sig Dispense Refill Last Dose  . ALPRAZolam (XANAX) 0.25 MG tablet Take 0.25 mg by mouth 2 (two) times daily.   02/17/2019 at 0700  . amLODipine (NORVASC) 10 MG tablet Take 5 mg by mouth daily.    02/17/2019 at 0700  . aspirin EC 81 MG tablet Take 81 mg by mouth daily.   02/17/2019 at 0700  . benazepril (LOTENSIN) 40 MG tablet Take 40 mg by mouth daily.   02/17/2019 at 0700  . cloNIDine (CATAPRES) 0.3 MG tablet Take 0.3 mg 3 (three) times daily by mouth.  2 02/17/2019 at 0700  . folic acid (FOLVITE) 1 MG tablet TAKE 1 TABLET(1 MG) BY MOUTH DAILY 90 tablet 4 02/17/2019 at 0700  . furosemide (LASIX) 20 MG tablet Take 20-40 mg by mouth daily. Take one tablet on odd days and two tablets on even days   02/17/2019 at 0700  . hydrALAZINE (APRESOLINE) 100 MG tablet Take 100 mg 3 (three) times daily by mouth.   02/17/2019 at 0700  . methimazole (TAPAZOLE) 5 MG tablet Take 5 mg by mouth daily.   11 02/17/2019 at 0700  . potassium chloride (K-DUR) 10 MEQ tablet Take 10 mEq by  mouth daily.    02/17/2019 at 0700  . rosuvastatin (CRESTOR) 5 MG tablet Take 5 mg by mouth at bedtime.  2 02/16/2019 at 2100  . warfarin (COUMADIN) 2 MG tablet Take 4 mg by mouth daily.    02/17/2019 at 1800  . warfarin (COUMADIN) 4 MG tablet Take 4 mg by mouth daily.   02/17/2019 at 1800  . amoxicillin (AMOXIL) 500 MG tablet Take 2,000 mg by mouth as needed.   prn at prn  . SODIUM BICARBONATE, ANTACID, PO Take 10 mg by mouth.   prn at prn    Current medications: Current Facility-Administered Medications  Medication Dose Route Frequency Provider Last Rate Last Dose  . 0.9 %  sodium chloride infusion   Intravenous Continuous Demetrios Loll, MD 50 mL/hr at 02/18/19 0253    . acetaminophen (TYLENOL) tablet 650 mg  650 mg Oral Q6H PRN Demetrios Loll, MD       Or  . acetaminophen (TYLENOL) suppository 650 mg  650 mg Rectal Q6H PRN Demetrios Loll, MD      .  albuterol (PROVENTIL) (2.5 MG/3ML) 0.083% nebulizer solution 2.5 mg  2.5 mg Nebulization Q2H PRN Demetrios Loll, MD      . ALPRAZolam Duanne Moron) tablet 0.25 mg  0.25 mg Oral BID Demetrios Loll, MD   0.25 mg at 02/18/19 1051  . bisacodyl (DULCOLAX) EC tablet 5 mg  5 mg Oral Daily PRN Demetrios Loll, MD      . feeding supplement (BOOST / RESOURCE BREEZE) liquid 1 Container  1 Container Oral TID BM Gladstone Lighter, MD      . folic acid (FOLVITE) tablet 1 mg  1 mg Oral Daily Demetrios Loll, MD   1 mg at 02/18/19 1052  . HYDROcodone-acetaminophen (NORCO/VICODIN) 5-325 MG per tablet 1-2 tablet  1-2 tablet Oral Q4H PRN Demetrios Loll, MD      . methimazole (TAPAZOLE) tablet 5 mg  5 mg Oral Daily Demetrios Loll, MD   5 mg at 02/18/19 1052  . multivitamin with minerals tablet 1 tablet  1 tablet Oral Daily Gladstone Lighter, MD      . ondansetron [facility]) tablet 4 mg  4 mg Oral Q6H PRN Demetrios Loll, MD       Or  . ondansetron [facility]) injection 4 mg  4 mg Intravenous Q6H PRN Demetrios Loll, MD      . pantoprazole (PROTONIX) injection 40 mg  40 mg Intravenous Q12H Demetrios Loll, MD   40 mg at 02/18/19  1051  . rosuvastatin (CRESTOR) tablet 5 mg  5 mg Oral QHS Demetrios Loll, MD      . senna-docusate (Senokot-S) tablet 1 tablet  1 tablet Oral QHS PRN Demetrios Loll, MD          Allergies: Allergies  Allergen Reactions  . Atorvastatin     Other reaction(s): Liver Disorder Elevated liver enzymes      Past Medical History: Past Medical History:  Diagnosis Date  . Anemia in chronic kidney disease (CKD) 11/14/2017  . Anxiety   . Aortic valve regurgitation   . Chronic kidney disease   . Coronary artery disease   . Dizziness   . Edema   . GERD (gastroesophageal reflux disease)   . Gout   . Hypercholesterolemia   . Hypertension   . Shortness of breath dyspnea      Past Surgical History: Past Surgical History:  Procedure Laterality Date  . ABDOMINAL HYSTERECTOMY    . AORTIC VALVE REPLACEMENT    . CARDIAC CATHETERIZATION    . CARDIAC CATHETERIZATION N/A 05/07/2015   Procedure: Left Heart Cath;  Surgeon: [name], MD;  Location: [facility];  Service: Cardiovascular;  Laterality: N/A;  . CARDIAC VALVE REPLACEMENT    . ELECTROPHYSIOLOGIC STUDY N/A 05/05/2015   Procedure: Cardioversion;  Surgeon: [name], MD;  Location: [facility];  Service: Cardiovascular;  Laterality: N/A;  . REPLACEMENT TOTAL KNEE BILATERAL       Family History: Family History  Problem Relation Age of Onset  . Hypertension Mother   . Colon cancer Mother   . Prostate cancer Father   . Kidney cancer Brother   . Prostate cancer Brother   . Prostate cancer Brother   . Breast cancer Neg Hx      Social History: Social History   Socioeconomic History  . Marital status: Married    Spouse name: Not on file  . Number of children: Not on file  . Years of education: Not on file  . Highest education level: Not on file  Occupational History  . Not  on file  Social Needs  . Financial resource strain: Not on file  . Food insecurity:    Worry: Not on file    Inability: Not on file  .  Transportation needs:    Medical: Not on file    Non-medical: Not on file  Tobacco Use  . Smoking status: Former Smoker    Packs/day: 0.50    Years: 20.00    Pack years: 10.00    Types: Cigarettes    Last attempt to quit: 2004    Years since quitting: 16.1  . Smokeless tobacco: Never Used  Substance and Sexual Activity  . Alcohol use: Yes    Alcohol/week: 0.0 standard drinks    Comment: rare  . Drug use: No  . Sexual activity: Not on file  Lifestyle  . Physical activity:    Days per week: Not on file    Minutes per session: Not on file  . Stress: Not on file  Relationships  . Social connections:    Talks on phone: Not on file    Gets together: Not on file    Attends religious service: Not on file    Active member of club or organization: Not on file    Attends meetings of clubs or organizations: Not on file    Relationship status: Not on file  . Intimate partner violence:    Fear of current or ex partner: Not on file    Emotionally abused: Not on file    Physically abused: Not on file    Forced sexual activity: Not on file  Other Topics Concern  . Not on file  Social History Narrative  . Not on file     Review of Systems: Gen: Denies any fevers or chills HEENT: Blurry vision prior to admission CV: Chronic atrial fibrillation, mechanical heart valve requiring anticoagulation Resp: No shortness of breath or cough at present GI: Appetite is getting better GU : No problems with voiding MS: No complaints Derm:  No complaints Psych: No complaints Heme: No complaints Neuro: No complaints Endocrine.  No complaints  Vital Signs: Blood pressure (!) 142/93, pulse 91, temperature 98.5 F (36.9 C), temperature source Oral, resp. rate 18, height 5\' 2"  (1.575 m), weight 76 kg, SpO2 93 %.   Intake/Output Summary (Last 24 hours) at 02/18/2019 1345 Last data filed at 02/18/2019 0954 Gross per 24 hour  Intake 1360.75 ml  Output 1400 ml  Net -39.25 ml    Weight  trends: Filed Weights   02/17/19 1535 02/17/19 2252  Weight: 78 kg 76 kg    Physical Exam: General:  No acute distress, sitting up in the recliner chair  HEENT  moist oral mucous membranes  Neck:  No JVD  Lungs:  Mild wheezing at bases, normal respiratory effort  Heart::  Irregular rhythm, loud click  Abdomen:  Soft, nontender  Extremities:  Trace edema  Neurologic:  Alert, oriented.    Skin:  No acute rashes    Lab results: Basic Metabolic Panel: Recent Labs  Lab 02/17/19 1537 02/18/19 0550  NA 141 141  K 4.7 4.4  CL 111 112*  CO2 22 19*  GLUCOSE 101* 92  BUN 90* 84*  CREATININE 2.68* 2.36*  CALCIUM 8.7* 9.3    Liver Function Tests: No results for input(s): AST, ALT, ALKPHOS, BILITOT, PROT, ALBUMIN in the last 168 hours. No results for input(s): LIPASE, AMYLASE in the last 168 hours. No results for input(s): AMMONIA in the last 168 hours.  CBC:  Recent Labs  Lab 02/17/19 1537 02/18/19 0550  WBC 5.6 7.7  HGB 6.1* 8.0*  HCT 20.7* 26.5*  MCV 99.5 96.0  PLT 228 226    Cardiac Enzymes: Recent Labs  Lab 02/18/19 0550  TROPONINI 1.47*    BNP: Invalid input(s): POCBNP  CBG: No results for input(s): GLUCAP in the last 168 hours.  Microbiology: No results found for this or any previous visit (from the past 720 hour(s)).   Coagulation Studies: Recent Labs    02/17/19 1828  LABPROT 21.8*  INR 1.9*    Urinalysis: No results for input(s): COLORURINE, LABSPEC, PHURINE, GLUCOSEU, HGBUR, BILIRUBINUR, KETONESUR, PROTEINUR, UROBILINOGEN, NITRITE, LEUKOCYTESUR in the last 72 hours.  Invalid input(s): APPERANCEUR      Imaging: Dg Chest 2 View  Result Date: 02/17/2019 CLINICAL DATA:  Shortness of breath. Nausea. Blurred vision. Hypotension. EXAM: CHEST - 2 VIEW COMPARISON:  Two-view chest x-ray 01/08/2018 FINDINGS: Heart is enlarged. Ectasia of the thoracic aorta is again seen. There is no edema or effusion. No focal airspace disease is present. The  visualized soft tissues and bony thorax are unremarkable. IMPRESSION: 1. Cardiomegaly without failure. 2. 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 Patient has a known chronic dissection. Electronically Signed   By: San Morelle M.D.   On: 02/17/2019 18:33   Dg Duanne Limerick W Double Cm (hd Ba)  Result Date: 02/18/2019 CLINICAL DATA:  Black dark stools EXAM: UPPER GI SERIES WITHOUT KUB TECHNIQUE: Routine upper GI series was performed with high density barium. Effervescent crystals were utilized as well. FLUOROSCOPY TIME:  Fluoroscopy Time:  5 minutes 36 seconds Radiation Exposure Index (if provided by the fluoroscopic device): 190.2 mGy Number of Acquired Spot Images: 17, multiple cine fluoroscopic runs COMPARISON:  None. FINDINGS: Swallowing mechanism is within normal limits. No laryngeal perforation or tracheal aspiration is seen. The esophageal mucosa is unremarkable. Mild extrinsic compression upon the esophagus is noted at the level of the cardiac shadow in part due to atrial enlargement as well as tortuosity of the thoracic aorta. This creates a functional narrowing. The stomach distends well and demonstrates no focal gastric ulcer. Some mild irregularity of the stomach mucosa is seen likely related to mild gastritis. Duodenal bulb and duodenal sweep demonstrate no evidence of focal ulcer. IMPRESSION: No significant ulcer disease is identified. Mild changes of gastritis. Functional narrowing in the distal esophagus related to cardiac enlargement and tortuous thoracic aorta causing extrinsic compression. Electronically Signed   By: Inez Catalina M.D.   On: 02/18/2019 10:59      Assessment & Plan: Pt is a 68 y.o. African-American  female with severe CAD, Aortic insufficiency, AVR (mechanical valve), CKD,chronic aortic dissection, Fem-fem bypass, rt axillofemoral bypass , was admitted on 02/17/2019 with severe anemia.   # Acute on chronic kidney disease  stage IV.  Underlying chronic kidney disease is likely secondary to severe atherosclerosis. At present, blood pressure control is acceptable.  Serum creatinine is down to 2.36/GFR 24 which is close to baseline from January 2020.  #Hypertension with chronic kidney disease -Managed as outpatient with amlodipine 5 mg daily, benazepril 40 mg daily, clonidine 0.3 Three times per day, hydralazine 100 mg 3 times per day -Currently not on any antihypertensives.  Monitor closely.  #Secondary hyperparathyroidism -PTH 201 from August 2019 -Continue to monitor calcium and phosphorus   LOS: [place] 3/9/20201:45 PM  [place], [place]  Note: This note was prepared with Viviann Spare  dictation. Any transcription errors are unintentional

## 2019-02-18 NOTE — Consult Note (Signed)
[facility] Cardiology  CARDIOLOGY CONSULT NOTE  Patient ID: Jeanette Romero MRN: [id number] DOB/AGE: [birth year] 68 y.o.  Admit date: 02/17/2019 Referring Physician Dr. Demetrios Loll Primary Physician Dr. Maryland Pink  Primary Cardiologist Dr. Deloria Lair  Reason for Consultation H/o of mechanical aortic valve, atrial fibrillation, elevated troponin   HPI: Jeanette Romero is a 68 year old female with a past medical history significant for severe aortic valve regurgitation s/p AVR in 2004, persistent atrial fibrillation on warfarin, coronary artery disease, hypertension, hyperlipidemia and anemia in chronic kidney disease who presented to the ED on 02/17/19 for a 2-3 day history of shortness of breath, nausea, and blurred vision.  Workup in the ED was significant for hemoglobin of 6.1, creatinine of 2.68, and initial troponin of 1.35.  She was admitted to telemetry and given PRBC transfusion.  GI and oncology have been consulted.   Today, Jeanette Romero reports improvement in symptoms.  Shortness of breath, nausea, and blurred vision have resolved.  Denies chest pain.  Denies abdominal pain, vomiting, or diarrhea.  Denies lower extremity swelling.   She is followed at [facility] cardiology with Dr. Cloretta Ned.  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   Review of systems complete and found to be negative unless listed above     Past Medical History:  Diagnosis Date  . Anemia in chronic kidney disease (CKD) 11/14/2017  . Anxiety   . Aortic valve regurgitation   . Chronic kidney disease   . Coronary artery disease   . Dizziness   . Edema   . GERD (gastroesophageal reflux disease)   . Gout   . Hypercholesterolemia   . Hypertension   . Shortness of breath dyspnea     Past Surgical History:  Procedure Laterality Date  . ABDOMINAL HYSTERECTOMY    .  AORTIC VALVE REPLACEMENT    . CARDIAC CATHETERIZATION    . CARDIAC CATHETERIZATION N/A 05/07/2015   Procedure: Left Heart Cath;  Surgeon: [name], MD;  Location: [place] [facility];  Service: Cardiovascular;  Laterality: N/A;  . CARDIAC VALVE REPLACEMENT    . ELECTROPHYSIOLOGIC STUDY N/A 05/05/2015   Procedure: Cardioversion;  Surgeon: [name], MD;  Location: [facility];  Service: Cardiovascular;  Laterality: N/A;  . REPLACEMENT TOTAL KNEE BILATERAL      Medications Prior to Admission  Medication Sig Dispense Refill Last Dose  . ALPRAZolam (XANAX) 0.25 MG tablet Take 0.25 mg by mouth 2 (two) times daily.   02/17/2019 at 0700  . amLODipine (NORVASC) 10 MG tablet Take 5 mg by mouth daily.    02/17/2019 at 0700  . aspirin EC 81 MG tablet Take 81 mg by mouth daily.   02/17/2019 at 0700  . benazepril (LOTENSIN) 40 MG tablet Take 40 mg by mouth daily.   02/17/2019 at 0700  . cloNIDine (CATAPRES) 0.3 MG tablet Take 0.3 mg 3 (three) times daily by mouth.  2 02/17/2019 at 0700  . folic acid (FOLVITE) 1 MG tablet TAKE 1 TABLET(1 MG) BY MOUTH DAILY 90 tablet 4 02/17/2019 at 0700  . furosemide (LASIX) 20 MG tablet Take 20-40 mg by mouth daily. Take one tablet on odd days and two tablets on even days   02/17/2019 at 0700  . hydrALAZINE (APRESOLINE) 100 MG tablet Take 100 mg 3 (three) times daily by mouth.   02/17/2019 at 0700  .  methimazole (TAPAZOLE) 5 MG tablet Take 5 mg by mouth daily.   11 02/17/2019 at 0700  . potassium chloride (K-DUR) 10 MEQ tablet Take 10 mEq by mouth daily.    02/17/2019 at 0700  . rosuvastatin (CRESTOR) 5 MG tablet Take 5 mg by mouth at bedtime.  2 02/16/2019 at 2100  . warfarin (COUMADIN) 2 MG tablet Take 4 mg by mouth daily.    02/17/2019 at 1800  . warfarin (COUMADIN) 4 MG tablet Take 4 mg by mouth daily.   02/17/2019 at 1800  . amoxicillin (AMOXIL) 500 MG tablet Take 2,000 mg by mouth as needed.   prn at prn  . SODIUM BICARBONATE, ANTACID, PO Take 10 mg by mouth.   prn at prn   Social  History   Socioeconomic History  . Marital status: Married    Spouse name: Not on file  . Number of children: Not on file  . Years of education: Not on file  . Highest education level: Not on file  Occupational History  . Not on file  Social Needs  . Financial resource strain: Not on file  . Food insecurity:    Worry: Not on file    Inability: Not on file  . Transportation needs:    Medical: Not on file    Non-medical: Not on file  Tobacco Use  . Smoking status: Former Smoker    Packs/day: 0.50    Years: 20.00    Pack years: 10.00    Types: Cigarettes    Last attempt to quit: 2004    Years since quitting: 16.1  . Smokeless tobacco: Never Used  Substance and Sexual Activity  . Alcohol use: Yes    Alcohol/week: 0.0 standard drinks    Comment: rare  . Drug use: No  . Sexual activity: Not on file  Lifestyle  . Physical activity:    Days per week: Not on file    Minutes per session: Not on file  . Stress: Not on file  Relationships  . Social connections:    Talks on phone: Not on file    Gets together: Not on file    Attends religious service: Not on file    Active member of club or organization: Not on file    Attends meetings of clubs or organizations: Not on file    Relationship status: Not on file  . Intimate partner violence:    Fear of current or ex partner: Not on file    Emotionally abused: Not on file    Physically abused: Not on file    Forced sexual activity: Not on file  Other Topics Concern  . Not on file  Social History Narrative  . Not on file    Family History  Problem Relation Age of Onset  . Hypertension Mother   . Colon cancer Mother   . Prostate cancer Father   . Kidney cancer Brother   . Prostate cancer Brother   . Prostate cancer Brother   . Breast cancer Neg Hx       Review of systems complete and found to be negative unless listed above      PHYSICAL EXAM  General: Well developed, well nourished, in no acute  distress HEENT:  Normocephalic and atramatic Neck:  No JVD.  Lungs: Clear bilaterally to auscultation and percussion. Heart: Irregularly irregular. Mechanical valve Abdomen: Bowel sounds are positive, abdomen soft and non-tender  Msk:  Back normal. Normal strength and tone for age. Extremities: No  clubbing or cyanosis. Trace peripheral edema bilaterally Neuro: Alert and oriented X 3. Psych:  Good affect, responds appropriately  Labs:   Lab Results  Component Value Date   WBC 7.7 02/18/2019   HGB 8.0 (L) 02/18/2019   HCT 26.5 (L) 02/18/2019   MCV 96.0 02/18/2019   PLT 226 02/18/2019    Recent Labs  Lab 02/18/19 0550  NA 141  K 4.4  CL 112*  CO2 19*  BUN 84*  CREATININE 2.36*  CALCIUM 9.3  GLUCOSE 92   Lab Results  Component Value Date   TROPONINI 1.47 ([place]) 02/18/2019   No results found for: CHOL No results found for: HDL No results found for: LDLCALC No results found for: TRIG No results found for: CHOLHDL No results found for: LDLDIRECT    Radiology: Dg Chest 2 View  Result Date: 02/17/2019 CLINICAL DATA:  Shortness of breath. Nausea. Blurred vision. Hypotension. EXAM: CHEST - 2 VIEW COMPARISON:  Two-view chest x-ray 01/08/2018 FINDINGS: Heart is enlarged. Ectasia of the thoracic aorta is again seen. There is no edema or effusion. No focal airspace disease is present. The visualized soft tissues and bony thorax are unremarkable. IMPRESSION: 1. Cardiomegaly without failure. 2. 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 Patient has a known chronic dissection. Electronically Signed   By: San Morelle M.D.   On: 02/17/2019 18:33    EKG: Atrial fibrillation with controlled ventricular response, frequent PVCs   ASSESSMENT AND PLAN:  1.  History of mechanical aortic valve   -Will continue to hold warfarin due to symptomatic anemia; do not recommend use of Vitamin K   -Will resume warfarin pending anemia workup  2.   Atrial fibrillation   -Rate controlled; will hold warfarin for now and resume when hemoglobin stabilizes  3.  Elevated troponin   -Likely demand ischemia in the setting of anemia; will defer further invasive ischemic workup for now given lack of symptoms and elevated creatinine; will monitor for symptoms    The history, physical exam findings, and plan of care were all discussed with Dr. Bartholome Bill, and all decision making was made in collaboration.   Signed: Avie Arenas PA-C 02/18/2019, 8:09 AM   Pt seen and examined. Agree with above plan.

## 2019-02-18 NOTE — Progress Notes (Signed)
Initial Nutrition Assessment  DOCUMENTATION CODES:   Obesity unspecified  INTERVENTION:   - Boost Breeze po TID, each supplement provides 250 kcal and 9 grams of protein  - MVI with minerals daily  NUTRITION DIAGNOSIS:   Inadequate oral intake related to poor appetite as evidenced by meal completion < 25%, per patient/family report.  GOAL:   Patient will meet greater than or equal to 90% of their needs  MONITOR:   PO intake, Supplement acceptance, Diet advancement, Labs, Weight trends  REASON FOR ASSESSMENT:   Malnutrition Screening Tool    ASSESSMENT:   68 year old female who presented to the ED on 3/8 with SOB, melena. PMH of CKD stage III, GERD, HTN, HLD, atrial fibrillation. Pt admitted with symptomatic anemia due to GI bleed.  Spoke with pt at bedside who reports her appetite has been poor over the last few days PTA and remains poor currently. Pt declined clear liquid lunch tray that Nutrition Services brought into pt's room. Pt states that she only wants ice chips right now. RD encouraged pt to at least try to sip on some liquids that contain calories.  Pt reports that she typically eats 2 "good meals" daily, lunch and dinner. Lunch typically includes a sandwich with chips. Dinner typically includes meat, vegetables, and a starch.  Pt is amenable to trying Boost Breeze oral nutrition supplement to aid in meeting kcal and protein needs while on a clear liquid diet. RD will monitor diet advancement and change supplement regimen as appropriate.  Pt endorses weight loss over time. Pt states that "last year" she weighed 230 lbs and lost weight down to 170 lbs due to "thyroid problems." Pt states that she did not have any appetite during this time. Pt reports that her weight loss has now stopped.  Per weight history in chart, pt with 13.4 kg weight loss over 11 months. This is a 15% weight loss which is not significant for timeframe.  Pt denies any N/V or abdominal pain at  this time.  Meal Completion: 0% x 1 recorded meal, pt also consumed 0% of lunch tray  Medications reviewed and include: folic acid, Protonix, NS @ 50 ml/hr  Labs reviewed: BUN 84 (H), creatinine 2.36 (H), hemoglobin 8.0 (L)  UOP: 1400 ml x 24 hours  NUTRITION - FOCUSED PHYSICAL EXAM:    Most Recent Value  Orbital Region  No depletion  Upper Arm Region  No depletion  Thoracic and Lumbar Region  No depletion  Buccal Region  No depletion  Temple Region  No depletion  Clavicle Bone Region  No depletion  Clavicle and Acromion Bone Region  No depletion  Scapular Bone Region  Unable to assess  Dorsal Hand  No depletion  Patellar Region  No depletion  Anterior Thigh Region  No depletion  Posterior Calf Region  No depletion  Edema (RD Assessment)  None  Hair  Reviewed  Eyes  Reviewed  Mouth  Reviewed  Skin  Reviewed  Nails  Reviewed       Diet Order:   Diet Order            Diet clear liquid Room service appropriate? Yes; Fluid consistency: Thin  Diet effective now              EDUCATION NEEDS:   Education needs have been addressed  Skin:  Skin Assessment: Reviewed RN Assessment  Last BM:  02/16/19  Height:   Ht Readings from Last 1 Encounters:  02/17/19 5\' 2"  (1.575 m)  Weight:   Wt Readings from Last 1 Encounters:  02/17/19 76 kg    Ideal Body Weight:  50 kg  BMI:  Body mass index is 30.65 kg/m.  Estimated Nutritional Needs:   Kcal:  1500-1700  Protein:  80-95 grams  Fluid:  1.5-1.7 L    Gaynell Face, MS, RD, LDN Inpatient Clinical Dietitian Pager: [phone] Weekend/After Hours: [phone]

## 2019-02-18 NOTE — Progress Notes (Addendum)
[place] [facility] [place] NAME: Jeanette Romero    MR#:  [id number]  DATE OF BIRTH:  Apr 15, 1951  SUBJECTIVE:  CHIEF COMPLAINT:   Chief Complaint  Patient presents with  . Shortness of Breath   Patient with A. fib, mechanical aortic valve on Coumadin presents to hospital secondary to weakness and dizziness and noted to have anemia. -Received transfusion on admission, hemoglobin at 8.  Patient continues to have dark stools.  REVIEW OF SYSTEMS:  Review of Systems  Constitutional: Positive for malaise/fatigue. Negative for chills and fever.  HENT: Negative for congestion, ear discharge, hearing loss and nosebleeds.   Eyes: Negative for blurred vision and double vision.  Respiratory: Negative for cough, shortness of breath and wheezing.   Cardiovascular: Negative for chest pain and palpitations.  Gastrointestinal: Positive for abdominal pain and melena. Negative for constipation, diarrhea, nausea and vomiting.  Genitourinary: Negative for dysuria.  Musculoskeletal: Negative for myalgias.  Neurological: Negative for dizziness, focal weakness, seizures, weakness and headaches.  Psychiatric/Behavioral: Negative for depression.    DRUG ALLERGIES:   Allergies  Allergen Reactions  . Atorvastatin     Other reaction(s): Liver Disorder Elevated liver enzymes    VITALS:  Blood pressure (!) 142/93, pulse 91, temperature 98.5 F (36.9 C), temperature source Oral, resp. rate 18, height 5\' 2"  (1.575 m), weight 76 kg, SpO2 93 %.  PHYSICAL EXAMINATION:  Physical Exam   GENERAL:  68 y.o.-year-old patient lying in the bed with no acute distress.  EYES: Pupils equal, round, reactive to light and accommodation. No scleral icterus. Extraocular muscles intact.  HEENT: Head atraumatic, normocephalic. Oropharynx and nasopharynx clear.  NECK:  Supple, no jugular venous distention. No thyroid enlargement, no tenderness.  LUNGS: Normal breath sounds  bilaterally, no wheezing, rales,rhonchi or crepitation. No use of accessory muscles of respiration.  Decreased bibasilar breath sounds CARDIOVASCULAR: S1, S2 normal. No  rubs, or gallops.  Loud 3/6 systolic murmur in the aortic area ABDOMEN: Soft, nontender, nondistended. Bowel sounds present. No organomegaly or mass.  EXTREMITIES: No pedal edema, cyanosis, or clubbing.  NEUROLOGIC: Cranial nerves II through XII are intact. Muscle strength 5/5 in all extremities. Sensation intact. Gait not checked.  PSYCHIATRIC: The patient is alert and oriented x 3.  SKIN: No obvious rash, lesion, or ulcer.    LABORATORY PANEL:   CBC Recent Labs  Lab 02/18/19 0550  WBC 7.7  HGB 8.0*  HCT 26.5*  PLT 226   ------------------------------------------------------------------------------------------------------------------  Chemistries  Recent Labs  Lab 02/18/19 0550  NA 141  K 4.4  CL 112*  CO2 19*  GLUCOSE 92  BUN 84*  CREATININE 2.36*  CALCIUM 9.3   ------------------------------------------------------------------------------------------------------------------  Cardiac Enzymes Recent Labs  Lab 02/18/19 0550  TROPONINI 1.47*   ------------------------------------------------------------------------------------------------------------------  RADIOLOGY:  Dg Chest 2 View  Result Date: 02/17/2019 CLINICAL DATA:  Shortness of breath. Nausea. Blurred vision. Hypotension. EXAM: CHEST - 2 VIEW COMPARISON:  Two-view chest x-ray 01/08/2018 FINDINGS: Heart is enlarged. Ectasia of the thoracic aorta is again seen. There is no edema or effusion. No focal airspace disease is present. The visualized soft tissues and bony thorax are unremarkable. IMPRESSION: 1. Cardiomegaly without failure. 2. 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 Patient has a known chronic dissection. Electronically Signed   By: San Morelle M.D.   On: 02/17/2019 18:33    Dg Duanne Limerick W Double Cm (hd Ba)  Result Date: 02/18/2019 CLINICAL DATA:  Black dark stools EXAM: UPPER GI SERIES WITHOUT KUB TECHNIQUE: Routine upper GI series was performed with high density barium. Effervescent crystals were utilized as well. FLUOROSCOPY TIME:  Fluoroscopy Time:  5 minutes 36 seconds Radiation Exposure Index (if provided by the fluoroscopic device): 190.2 mGy Number of Acquired Spot Images: 17, multiple cine fluoroscopic runs COMPARISON:  None. FINDINGS: Swallowing mechanism is within normal limits. No laryngeal perforation or tracheal aspiration is seen. The esophageal mucosa is unremarkable. Mild extrinsic compression upon the esophagus is noted at the level of the cardiac shadow in part due to atrial enlargement as well as tortuosity of the thoracic aorta. This creates a functional narrowing. The stomach distends well and demonstrates no focal gastric ulcer. Some mild irregularity of the stomach mucosa is seen likely related to mild gastritis. Duodenal bulb and duodenal sweep demonstrate no evidence of focal ulcer. IMPRESSION: No significant ulcer disease is identified. Mild changes of gastritis. Functional narrowing in the distal esophagus related to cardiac enlargement and tortuous thoracic aorta causing extrinsic compression. Electronically Signed   By: Inez Catalina M.D.   On: 02/18/2019 10:59    EKG:   Orders placed or performed during the hospital encounter of 02/17/19  . ED EKG  . ED EKG  . EKG 12-Lead  . EKG 12-Lead    ASSESSMENT AND PLAN:   68 year old female with past medical history significant for chronic anemia, CKD stage IV, hypertension, atrial fibrillation on Coumadin, history of mechanical aortic valve on anticoagulation, hypertension presents to hospital secondary to weakness and noted to have anemia   1.  Acute on chronic anemia-has been having melanotic stools.  No prior EGD or colonoscopy -INR is 1.9, Coumadin is on hold.  Discussed with cardiology.  No  vitamin K or FFP. -Received 2 units packed RBC transfusion.  Hemoglobin is at 8 -Continue to monitor, if any acute GI bleed noted-do a tagged RBC scan or CT angiogram. -Appreciate GI consult.  Might need EGD and colonoscopy this admission once cleared by cardiology, -Transfuse as needed -IV Protonix -Clear liquid diet for now  2.  Elevated troponin-likely demand ischemia.  Troponins have plateaued. -Cardiac catheterization prior to her aortic valve replacement in 2016 showing mild to moderate coronary disease -No cardiac intervention at this time. -Follow cardiology recommendations.  3.  Paroxysmal atrial fibrillation-failed cardioversion twice.  Rate seems to be well controlled.  Hold off on anticoagulation at this time.  4.  Hyperthyroidism-continue methimazole.  Follow-up TSH and T4 levels  5. CKD stage IV-nephrology has been consulted. hold nephrotoxins.  6.  DVT prophylaxis-teds and SCDs only  7.  Hypertension-monitor, meds on hold given GI bleed  Ambulatory at baseline   All the records are reviewed and case discussed with Care Management/Social Workerr. Management plans discussed with the patient, family and they are in agreement.  CODE STATUS: Full code  TOTAL TIME TAKING CARE OF THIS PATIENT: 38 minutes.   POSSIBLE D/C IN 2 DAYS, DEPENDING ON CLINICAL CONDITION.   Gladstone Lighter M.D on 02/18/2019 at 12:08 PM  Between 7am to 6pm - Pager - [phone]  After 6pm go to [email or web address] - password [email or web address] [facility]  Office  [phone]  CC: Primary care physician; [place] Pink, MD

## 2019-02-19 ENCOUNTER — Encounter: Payer: Self-pay | Admitting: Anesthesiology

## 2019-02-19 ENCOUNTER — Inpatient Hospital Stay: Payer: Medicare Other | Admitting: Anesthesiology

## 2019-02-19 ENCOUNTER — Encounter
Admission: EM | Disposition: A | Discharge status: Home / Self Care | Payer: Self-pay | Source: Home / Self Care | Attending: Internal Medicine

## 2019-02-19 DIAGNOSIS — K921 Melena: Secondary | ICD-10-CM

## 2019-02-19 DIAGNOSIS — K228 Other specified diseases of esophagus: Secondary | ICD-10-CM

## 2019-02-19 DIAGNOSIS — K2289 Other specified disease of esophagus: Secondary | ICD-10-CM

## 2019-02-19 HISTORY — PX: ESOPHAGOGASTRODUODENOSCOPY: SHX5428

## 2019-02-19 LAB — BASIC METABOLIC PANEL
ANION GAP: 12 (ref 5–15)
BUN: 68 mg/dL — ABNORMAL HIGH (ref 8–23)
CO2: 18 mmol/L — ABNORMAL LOW (ref 22–32)
Calcium: 9.2 mg/dL (ref 8.9–10.3)
Chloride: 114 mmol/L — ABNORMAL HIGH (ref 98–111)
Creatinine, Ser: 2.17 mg/dL — ABNORMAL HIGH (ref 0.44–1.00)
GFR calc Af Amer: 26 mL/min — ABNORMAL LOW (ref >60)
GFR calc non Af Amer: 23 mL/min — ABNORMAL LOW (ref >60)
Glucose, Bld: 104 mg/dL — ABNORMAL HIGH (ref 70–99)
POTASSIUM: 4.3 mmol/L (ref 3.5–5.1)
Sodium: 144 mmol/L (ref 135–145)

## 2019-02-19 LAB — TYPE AND SCREEN
ABO/RH(D): B POS
Antibody Screen: NEGATIVE
UNIT DIVISION: 0
Unit division: 0

## 2019-02-19 LAB — PROTIME-INR
INR: 2 — ABNORMAL HIGH (ref 0.8–1.2)
Prothrombin Time: 22 seconds — ABNORMAL HIGH (ref 11.4–15.2)

## 2019-02-19 LAB — CBC
HCT: 29.3 % — ABNORMAL LOW (ref 36.0–46.0)
Hemoglobin: 8.6 g/dL — ABNORMAL LOW (ref 12.0–15.0)
MCH: 28.6 pg (ref 26.0–34.0)
MCHC: 29.4 g/dL — ABNORMAL LOW (ref 30.0–36.0)
MCV: 97.3 fL (ref 80.0–100.0)
NRBC: 0.3 % — AB (ref 0.0–0.2)
Platelets: 262 10*3/uL (ref 150–400)
RBC: 3.01 MIL/uL — AB (ref 3.87–5.11)
RDW: 16.6 % — ABNORMAL HIGH (ref 11.5–15.5)
WBC: 12.8 10*3/uL — ABNORMAL HIGH (ref 4.0–10.5)

## 2019-02-19 LAB — BPAM RBC
Blood Product Expiration Date: 202003312359
Blood Product Expiration Date: 202003312359
ISSUE DATE / TIME: 202003082032
ISSUE DATE / TIME: 202003090003
Unit Type and Rh: 7300
Unit Type and Rh: 7300

## 2019-02-19 LAB — HIV ANTIBODY (ROUTINE TESTING W REFLEX): HIV Screen 4th Generation wRfx: NONREACTIVE

## 2019-02-19 SURGERY — EGD (ESOPHAGOGASTRODUODENOSCOPY)
Anesthesia: General | Laterality: Left

## 2019-02-19 MED ORDER — LIDOCAINE HCL (CARDIAC) PF 100 MG/5ML IV SOSY
PREFILLED_SYRINGE | INTRAVENOUS | Status: DC | PRN
  Administered 2019-02-19: 60 mg via INTRAVENOUS

## 2019-02-19 MED ORDER — LIDOCAINE HCL (PF) 2 % IJ SOLN
INTRAMUSCULAR | Status: AC
  Filled 2019-02-19: qty 10

## 2019-02-19 MED ORDER — SODIUM CHLORIDE 0.9 % IV SOLN
INTRAVENOUS | Status: DC
  Administered 2019-02-19: 13:00:00 via INTRAVENOUS

## 2019-02-19 MED ORDER — SODIUM CHLORIDE 0.9 % IV SOLN: INTRAVENOUS | Status: DC

## 2019-02-19 MED ORDER — PEG 3350-KCL-NA BICARB-NACL 420 G PO SOLR
4000.0000 mL | Freq: Once | ORAL | Status: AC
  Administered 2019-02-19: 4000 mL via ORAL
  Filled 2019-02-19: qty 4000

## 2019-02-19 MED ORDER — PROPOFOL 10 MG/ML IV BOLUS
INTRAVENOUS | Status: DC | PRN
  Administered 2019-02-19: 50 mg via INTRAVENOUS

## 2019-02-19 MED ORDER — CLONIDINE HCL 0.1 MG PO TABS
0.2000 mg | ORAL_TABLET | Freq: Two times a day (BID) | ORAL | Status: DC
  Administered 2019-02-19 – 2019-02-22 (×7): 0.2 mg via ORAL
  Filled 2019-02-19 (×8): qty 2

## 2019-02-19 MED ORDER — PANTOPRAZOLE SODIUM 20 MG PO TBEC
20.0000 mg | DELAYED_RELEASE_TABLET | Freq: Every day | ORAL | Status: DC
  Administered 2019-02-20 – 2019-02-22 (×3): 20 mg via ORAL
  Filled 2019-02-19 (×4): qty 1

## 2019-02-19 MED ORDER — BISACODYL 5 MG PO TBEC
10.0000 mg | DELAYED_RELEASE_TABLET | Freq: Once | ORAL | Status: AC
  Administered 2019-02-19: 10 mg via ORAL
  Filled 2019-02-19 (×2): qty 2

## 2019-02-19 MED ORDER — PROPOFOL 500 MG/50ML IV EMUL
INTRAVENOUS | Status: DC | PRN
  Administered 2019-02-19: 150 ug/kg/min via INTRAVENOUS

## 2019-02-19 MED ORDER — METOPROLOL TARTRATE 25 MG PO TABS
25.0000 mg | ORAL_TABLET | Freq: Two times a day (BID) | ORAL | Status: DC
  Administered 2019-02-19 – 2019-02-22 (×7): 25 mg via ORAL
  Filled 2019-02-19 (×7): qty 1

## 2019-02-19 MED ORDER — AMLODIPINE BESYLATE 5 MG PO TABS
5.0000 mg | ORAL_TABLET | Freq: Every day | ORAL | Status: DC
  Administered 2019-02-19 – 2019-02-22 (×4): 5 mg via ORAL
  Filled 2019-02-19 (×5): qty 1

## 2019-02-19 NOTE — Progress Notes (Signed)
Patients BP 158/90, currently in Afib with HR fluctuating between lows 100's-120's, has gotten as high as 150's, MD notified, orders given for 25mg  PO lopressor, will give and continue to monitor.

## 2019-02-19 NOTE — Plan of Care (Signed)
  Problem: Bowel/Gastric: Goal: Will show no signs and symptoms of gastrointestinal bleeding Outcome: Progressing   Problem: Clinical Measurements: Goal: Will remain free from infection Outcome: Progressing

## 2019-02-19 NOTE — Op Note (Addendum)
[facility] Gastroenterology Patient Name: Jeanette Romero Procedure Date: 02/19/2019 12:41 PM MRN: [id number] Account #: [id number] Date of Birth: 08/13/1951 Admit Type: Outpatient Age: 68 Room: [facility] ENDO ROOM 2 Gender: Female Note Status: Finalized Procedure:            Upper GI endoscopy Indications:          Acute post hemorrhagic anemia, Melena Providers:            Lucky Trotta B. Bonna Gains MD, MD Referring MD:         Irven Easterly. Kary Kos, MD (Referring MD) Medicines:            Monitored Anesthesia Care Complications:        No immediate complications. Procedure:            Pre-Anesthesia Assessment:                       - Prior to the procedure, a History and Physical was                        performed, and patient medications, allergies and                        sensitivities were reviewed. The patient's tolerance of                        previous anesthesia was reviewed.                       - The risks and benefits of the procedure and the                        sedation options and risks were discussed with the                        patient. All questions were answered and informed                        consent was obtained.                       - Patient identification and proposed procedure were                        verified prior to the procedure by the physician, the                        nurse, the anesthesiologist, the anesthetist and the                        technician. The procedure was verified in the procedure                        room.                       - ASA Grade Assessment: III - A patient with severe                        systemic disease.  After obtaining informed consent, the endoscope was                        passed under direct vision. Throughout the procedure,                        the patient's blood pressure, pulse, and oxygen                        saturations were monitored continuously.  The Endoscope                        was introduced through the mouth, and advanced to the                        third part of duodenum. The upper GI endoscopy was                        accomplished with ease. The patient tolerated the                        procedure well. Findings:      There were esophageal mucosal changes suspicious for short-segment       Barrett's esophagus present in the distal esophagus. The maximum       longitudinal extent of these mucosal changes was 1 cm in length.       Biopsies were not done due to recent melena and GI bleed. Pt will need       to be seen in clinic and repeat EGD scheduled in the future for biopsies.      The entire examined stomach was normal.      The duodenal bulb, second portion of the duodenum and examined duodenum       were normal. Impression:           - Esophageal mucosal changes suspicious for                        short-segment Barrett's esophagus.                       - Normal stomach.                       - Normal duodenal bulb, second portion of the duodenum                        and examined duodenum.                       - No specimens collected.                       - No evidence of active or recent bleeding seen                        throughout the exam. Recommendation:       - Continue present medications.                       - Patient has a contact number available for  emergencies. The signs and symptoms of potential                        delayed complications were discussed with the patient.                        Return to normal activities tomorrow. Written discharge                        instructions were provided to the patient.                       - The findings and recommendations were discussed with                        the patient.                       - The findings and recommendations were discussed with                        the patient's family.                        - Perform a colonoscopy tomorrow.                       - Clear liquid diet today.                       - Change PPI to oral once daily                       - (Risks of PPI use were discussed with patient                        including bone loss, C. Diff diarrhea, pneumonia,                        infections, CKD, electrolyte abnormalities. If                        clinically possible based on symptoms, goal would be to                        maintain patient on the lowest dose possible, or                        discontinue the medication with institution of acid                        reflux lifestyle modifications over time. Pt.                        Verbalizes understanding and chooses to continue the                        medication.) Procedure Code(s):    --- Professional ---                       [id number], Esophagogastroduodenoscopy, flexible, transoral;  diagnostic, including collection of specimen(s) by                        brushing or washing, when performed (separate procedure) Diagnosis Code(s):    --- Professional ---                       K22.8, Other specified diseases of esophagus                       D62, Acute posthemorrhagic anemia                       K92.1, Melena (includes Hematochezia) CPT copyright 2018 American Medical Association. All rights reserved. The codes documented in this report are preliminary and upon coder review may  be revised to meet current compliance requirements.  Vonda Antigua, MD Margretta Sidle B. Bonna Gains MD, MD 02/19/2019 1:16:16 PM This report has been signed electronically. Number of Addenda: 0 Note Initiated On: 02/19/2019 12:41 PM Estimated Blood Loss: Estimated blood loss: none.      [facility]

## 2019-02-19 NOTE — Anesthesia Preprocedure Evaluation (Signed)
Anesthesia Evaluation  Patient identified by MRN, date of birth, ID band Patient awake    Reviewed: Allergy & Precautions, NPO status , Patient's Chart, lab work & pertinent test results, reviewed documented beta blocker date and time   Airway Mallampati: III  TM Distance: >3 FB     Dental  (+) Chipped   Pulmonary shortness of breath, former smoker,           Cardiovascular hypertension, Pt. on medications + CAD  + dysrhythmias Atrial Fibrillation      Neuro/Psych PSYCHIATRIC DISORDERS Anxiety Depression    GI/Hepatic GERD  ,  Endo/Other  Hyperthyroidism   Renal/GU CRFRenal disease     Musculoskeletal   Abdominal   Peds  Hematology  (+) anemia ,   Anesthesia Other Findings Obese.AVR. CKD IV. 2 u blood. Hb now 8.6, from 8.0.Gout.  Reproductive/Obstetrics                             Anesthesia Physical Anesthesia Plan  ASA: III  Anesthesia Plan: General   Post-op Pain Management:    Induction: Intravenous  PONV Risk Score and Plan:   Airway Management Planned:   Additional Equipment:   Intra-op Plan:   Post-operative Plan:   Informed Consent: I have reviewed the patients History and Physical, chart, labs and discussed the procedure including the risks, benefits and alternatives for the proposed anesthesia with the patient or authorized representative who has indicated his/her understanding and acceptance.       Plan Discussed with: CRNA  Anesthesia Plan Comments:         Anesthesia Quick Evaluation

## 2019-02-19 NOTE — Progress Notes (Signed)
Patient Name: Jeanette Romero Date of Encounter: 02/19/2019  Hospital Problem List     Active Problems:   Symptomatic anemia   Acute blood loss anemia   Demand ischemia of myocardium [facility])    Patient Profile     68 year old female with multiple medical and cardiac problems including history of an aortic valve replacement with a mechanical prosthesis for aortic dissection and aortic insufficiency.  She also has a history of chronic kidney disease, atrial fibrillation, hypertension who was admitted with GI bleed.  She is followed at [facility].  Hemoglobin on admission was 6.1.  Creatinine was 2.68 up from baseline of approximately 2 with a GFR of 20.  Serum troponin mildly elevated at 1.35 increased to 1.47.  INR was 1.9 on admission.  Chest x-ray showed cardiomegaly without congestive heart failure.  EKG showed atrial fibrillation with incomplete left bundle branch block.  She is on an aggressive antihypertensive regimen as an outpatient including amlodipine 5 mg daily, benazepril 40 mg daily, clonidine 0.3 mg 3 times daily, furosemide 20 mg daily, hydralazine 100 mg 3 times daily.  Subjective   Quite anxious this morning concerned about not being on her blood pressure medicines.  Also concerned about still having dark stools.  Inpatient Medications    . ALPRAZolam  0.25 mg Oral BID  . amLODipine  5 mg Oral Daily  . cloNIDine  0.2 mg Oral BID  . feeding supplement  1 Container Oral TID BM  . folic acid  1 mg Oral Daily  . methimazole  5 mg Oral Daily  . multivitamin with minerals  1 tablet Oral Daily  . pantoprazole (PROTONIX) IV  40 mg Intravenous Q12H  . rosuvastatin  5 mg Oral QHS    Vital Signs    Vitals:   02/18/19 1533 02/18/19 1939 02/19/19 0349 02/19/19 0731  BP: (!) 147/67 (!) 142/89 (!) 167/100 (!) 158/90  Pulse: 84 95  77  Resp: 18   19  Temp: 98.4 F (36.9 C) 98.2 F (36.8 C) 98.4 F (36.9 C) 98 F (36.7 C)  TempSrc: Oral Oral Oral Oral   SpO2: 100% 100% 100% 96%  Weight:      Height:        Intake/Output Summary (Last 24 hours) at 02/19/2019 0743 Last data filed at 02/19/2019 0357 Gross per 24 hour  Intake 639.06 ml  Output 700 ml  Net -60.94 ml   Filed Weights   02/17/19 1535 02/17/19 2252  Weight: 78 kg 76 kg    Physical Exam    GEN: Well nourished, well developed, in no acute distress.  HEENT: normal.  Neck: Supple, no JVD, carotid bruits, or masses. Cardiac: Irregular irregular rhythm with mechanical heart sounds.  Crisp prosthetic valve sounds. Respiratory:  Respirations regular and unlabored, clear to auscultation bilaterally. GI: Soft, nontender, nondistended, BS + x 4. MS: no deformity or atrophy. Skin: warm and dry, no rash. Neuro:  Strength and sensation are intact. Psych: Normal affect.  Labs    CBC Recent Labs    02/18/19 0550 02/19/19 0526  WBC 7.7 12.8*  HGB 8.0* 8.6*  HCT 26.5* 29.3*  MCV 96.0 97.3  PLT 226 520   Basic Metabolic Panel Recent Labs    02/18/19 0550 02/19/19 0526  NA 141 144  K 4.4 4.3  CL 112* 114*  CO2 19* 18*  GLUCOSE 92 104*  BUN 84* 68*  CREATININE 2.36* 2.17*  CALCIUM 9.3 9.2   Liver Function  Tests No results for input(s): AST, ALT, ALKPHOS, BILITOT, PROT, ALBUMIN in the last 72 hours. No results for input(s): LIPASE, AMYLASE in the last 72 hours. Cardiac Enzymes Recent Labs    02/17/19 1537 02/18/19 0038 02/18/19 0550  TROPONINI 1.35* 1.35* 1.47*   BNP No results for input(s): BNP in the last 72 hours. D-Dimer No results for input(s): DDIMER in the last 72 hours. Hemoglobin A1C No results for input(s): HGBA1C in the last 72 hours. Fasting Lipid Panel No results for input(s): CHOL, HDL, LDLCALC, TRIG, CHOLHDL, LDLDIRECT in the last 72 hours. Thyroid Function Tests Recent Labs    02/18/19 1329  TSH 1.421    Telemetry    Atrial fibrillation with controlled ventricular response  ECG    Atrial fibrillation with incomplete left  bundle branch block  Radiology    Dg Chest 2 View  Result Date: 02/17/2019 CLINICAL DATA:  Shortness of breath. Nausea. Blurred vision. Hypotension. EXAM: CHEST - 2 VIEW COMPARISON:  Two-view chest x-ray 01/08/2018 FINDINGS: Heart is enlarged. Ectasia of the thoracic aorta is again seen. There is no edema or effusion. No focal airspace disease is present. The visualized soft tissues and bony thorax are unremarkable. IMPRESSION: 1. Cardiomegaly without failure. 2. 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 Patient has a known chronic dissection. Electronically Signed   By: San Morelle M.D.   On: 02/17/2019 18:33   Dg Duanne Limerick W Double Cm (hd Ba)  Result Date: 02/18/2019 CLINICAL DATA:  Black dark stools EXAM: UPPER GI SERIES WITHOUT KUB TECHNIQUE: Routine upper GI series was performed with high density barium. Effervescent crystals were utilized as well. FLUOROSCOPY TIME:  Fluoroscopy Time:  5 minutes 36 seconds Radiation Exposure Index (if provided by the fluoroscopic device): 190.2 mGy Number of Acquired Spot Images: 17, multiple cine fluoroscopic runs COMPARISON:  None. FINDINGS: Swallowing mechanism is within normal limits. No laryngeal perforation or tracheal aspiration is seen. The esophageal mucosa is unremarkable. Mild extrinsic compression upon the esophagus is noted at the level of the cardiac shadow in part due to atrial enlargement as well as tortuosity of the thoracic aorta. This creates a functional narrowing. The stomach distends well and demonstrates no focal gastric ulcer. Some mild irregularity of the stomach mucosa is seen likely related to mild gastritis. Duodenal bulb and duodenal sweep demonstrate no evidence of focal ulcer. IMPRESSION: No significant ulcer disease is identified. Mild changes of gastritis. Functional narrowing in the distal esophagus related to cardiac enlargement and tortuous thoracic aorta causing extrinsic  compression. Electronically Signed   By: Inez Catalina M.D.   On: 02/18/2019 10:59    Assessment & Plan    68 year old female with history of aortic dissection with aortic insufficiency resulting in aortic valve replacement with a mechanical device, history of atrial fibrillation, history of chronic anticoagulation with warfarin, history of hypertension, history of chronic kidney disease admitted with melena and acute GI bleed.  INR was 1.9 on presentation.  Anemia-secondary to GI bleed.  Would hold warfarin for now and resume as soon as bleeding is stabilized.  INR goal between 2 and 3 given aortic prosthesis.  She also has atrial fibrillation.  Would transfuse as you are doing.  Would agree with GI evaluation.  Patient stable from a cardiac standpoint would proceed with endoscopy/colonoscopy as needed with routine cardiac monitoring.  Would avoid vitamin K use.  Elevated troponin-likely secondary to demand.  Does not appear to have an acute coronary syndrome.  Cardiac catheterization in 2016 revealed noncritical disease.  Hypertension-patient was on an aggressive antihypertensive regimen at home.  Will need to resume her antihypertensive regimen slowly.  Will add clonidine back at 0.2 mg 3 times daily and amlodipine.  Will likely need to add back to remainder of her antihypertensive regimen as her hemodynamics stabilize  Aortic valve disease-status post mechanical prosthesis.  Would continue to hold warfarin as you are doing until GI bleed stabilizes.  Will need to resume this as soon as possible.  INR goal for an aortic mechanical prosthesis is 2-3.  Atrial fibrillation-rate is controlled at present.  Will follow.  As above, when stable will add warfarin back with an INR goal between 2 and 3  Chronic kidney disease-appears to have acute on chronic likely secondary to GI blood loss.  Baseline appears to be a GFR of approximately 30.  Currently 26.    Signed, Javier Docker Fath MD 02/19/2019, 7:43  AM  Pager: [phone]

## 2019-02-19 NOTE — Anesthesia Procedure Notes (Signed)
Performed by: Johnna Acosta, CRNA Pre-anesthesia Checklist: Emergency Drugs available, Patient identified, Suction available, Patient being monitored and Timeout performed Patient Re-evaluated:Patient Re-evaluated prior to induction Oxygen Delivery Method: Nasal cannula Induction Type: IV induction

## 2019-02-19 NOTE — Progress Notes (Signed)
[place] [facility] [place] NAME: Jeanette Romero    MR#:  [id number]  DATE OF BIRTH:  September 06, 1951  SUBJECTIVE:  CHIEF COMPLAINT:   Chief Complaint  Patient presents with  . Shortness of Breath   -Hemoglobin is now stable, received 2 units transfusion on admission -For EGD today.  INR 2.0.  Coumadin has been held  REVIEW OF SYSTEMS:  Review of Systems  Constitutional: Positive for malaise/fatigue. Negative for chills and fever.  HENT: Negative for congestion, ear discharge, hearing loss and nosebleeds.   Eyes: Negative for blurred vision and double vision.  Respiratory: Negative for cough, shortness of breath and wheezing.   Cardiovascular: Negative for chest pain and palpitations.  Gastrointestinal: Positive for melena. Negative for abdominal pain, constipation, diarrhea, nausea and vomiting.  Genitourinary: Negative for dysuria.  Musculoskeletal: Negative for myalgias.  Neurological: Negative for dizziness, focal weakness, seizures, weakness and headaches.  Psychiatric/Behavioral: Negative for depression.    DRUG ALLERGIES:   Allergies  Allergen Reactions  . Atorvastatin     Other reaction(s): Liver Disorder Elevated liver enzymes    VITALS:  Blood pressure (!) 111/93, pulse 90, temperature 98 F (36.7 C), temperature source Oral, resp. rate 19, height 5\' 2"  (1.575 m), weight 76 kg, SpO2 96 %.  PHYSICAL EXAMINATION:  Physical Exam   GENERAL:  68 y.o.-year-old patient lying in the bed with no acute distress.  EYES: Pupils equal, round, reactive to light and accommodation. No scleral icterus. Extraocular muscles intact.  HEENT: Head atraumatic, normocephalic. Oropharynx and nasopharynx clear.  NECK:  Supple, no jugular venous distention. No thyroid enlargement, no tenderness.  LUNGS: Normal breath sounds bilaterally, no wheezing, rales,rhonchi or crepitation. No use of accessory muscles of respiration.  Decreased bibasilar breath  sounds CARDIOVASCULAR: S1, S2 normal. No  rubs, or gallops.  Loud 3/6 systolic murmur in the aortic area ABDOMEN: Soft, nontender, nondistended. Bowel sounds present. No organomegaly or mass.  EXTREMITIES: No pedal edema, cyanosis, or clubbing.  NEUROLOGIC: Cranial nerves II through XII are intact. Muscle strength 5/5 in all extremities. Sensation intact. Gait not checked.  PSYCHIATRIC: The patient is alert and oriented x 3.  SKIN: No obvious rash, lesion, or ulcer.    LABORATORY PANEL:   CBC Recent Labs  Lab 02/19/19 0526  WBC 12.8*  HGB 8.6*  HCT 29.3*  PLT 262   ------------------------------------------------------------------------------------------------------------------  Chemistries  Recent Labs  Lab 02/19/19 0526  NA 144  K 4.3  CL 114*  CO2 18*  GLUCOSE 104*  BUN 68*  CREATININE 2.17*  CALCIUM 9.2   ------------------------------------------------------------------------------------------------------------------  Cardiac Enzymes Recent Labs  Lab 02/18/19 0550  TROPONINI 1.47*   ------------------------------------------------------------------------------------------------------------------  RADIOLOGY:  Dg Chest 2 View  Result Date: 02/17/2019 CLINICAL DATA:  Shortness of breath. Nausea. Blurred vision. Hypotension. EXAM: CHEST - 2 VIEW COMPARISON:  Two-view chest x-ray 01/08/2018 FINDINGS: Heart is enlarged. Ectasia of the thoracic aorta is again seen. There is no edema or effusion. No focal airspace disease is present. The visualized soft tissues and bony thorax are unremarkable. IMPRESSION: 1. Cardiomegaly without failure. 2. 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 Patient has a known chronic dissection. Electronically Signed   By: San Morelle M.D.   On: 02/17/2019 18:33   Dg Duanne Limerick W Double Cm (hd Ba)  Result Date: 02/18/2019 CLINICAL DATA:  Black dark stools EXAM: UPPER GI SERIES WITHOUT KUB  TECHNIQUE: Routine upper GI series was performed  with high density barium. Effervescent crystals were utilized as well. FLUOROSCOPY TIME:  Fluoroscopy Time:  5 minutes 36 seconds Radiation Exposure Index (if provided by the fluoroscopic device): 190.2 mGy Number of Acquired Spot Images: 17, multiple cine fluoroscopic runs COMPARISON:  None. FINDINGS: Swallowing mechanism is within normal limits. No laryngeal perforation or tracheal aspiration is seen. The esophageal mucosa is unremarkable. Mild extrinsic compression upon the esophagus is noted at the level of the cardiac shadow in part due to atrial enlargement as well as tortuosity of the thoracic aorta. This creates a functional narrowing. The stomach distends well and demonstrates no focal gastric ulcer. Some mild irregularity of the stomach mucosa is seen likely related to mild gastritis. Duodenal bulb and duodenal sweep demonstrate no evidence of focal ulcer. IMPRESSION: No significant ulcer disease is identified. Mild changes of gastritis. Functional narrowing in the distal esophagus related to cardiac enlargement and tortuous thoracic aorta causing extrinsic compression. Electronically Signed   By: Inez Catalina M.D.   On: 02/18/2019 10:59    EKG:   Orders placed or performed during the hospital encounter of 02/17/19  . ED EKG  . ED EKG  . EKG 12-Lead  . EKG 12-Lead    ASSESSMENT AND PLAN:   68 year old female with past medical history significant for chronic anemia, CKD stage IV, hypertension, atrial fibrillation on Coumadin, history of mechanical aortic valve on anticoagulation, hypertension presents to hospital secondary to weakness and noted to have anemia   1.  Acute on chronic anemia-has been having melanotic stools.  No prior EGD or colonoscopy -INR is 2, Coumadin is on hold.  Discussed with cardiology.  No vitamin K or FFP. -Received 2 units packed RBC transfusion.  Hemoglobin is stable at 8 -Continue to monitor, if any acute GI  bleed noted-do a tagged RBC scan or CT angiogram. -Appreciate GI consult.   - EGD and colonoscopy this admission -for EGD today, -Transfuse as needed if hemoglobin is less than 8 -IV Protonix -Upper GI series with possible gastritis  2.  Elevated troponin-likely demand ischemia.  Troponins have plateaued. -Cardiac catheterization prior to her aortic valve replacement in 2016 showing mild to moderate coronary disease -No cardiac intervention at this time. -Follow cardiology recommendations.  3.  Paroxysmal atrial fibrillation-failed cardioversion twice.  Rate fluctuating.  Started low-dose metoprolol -.  Hold off on anticoagulation at this time.  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  Hyperthyroidism-continue methimazole.  Follow-up TSH and T4 levels  5. CKD stage IV-nephrology has been consulted. hold nephrotoxins.  6.  DVT prophylaxis-teds and SCDs only  7.  Hypertension-monitor, home meds restarted.  On Norvasc, clonidine and metoprolol added today  Ambulatory at baseline Daughter updated at bedside   All the records are reviewed and case discussed with Care Management/Social Workerr. Management plans discussed with the patient, family and they are in agreement.  CODE STATUS: Full code  TOTAL TIME TAKING CARE OF THIS PATIENT: 38 minutes.   POSSIBLE D/C IN 2 DAYS, DEPENDING ON CLINICAL CONDITION.   Gladstone Lighter M.D on 02/19/2019 at 12:09 PM  Between 7am to 6pm - Pager - [phone]  After 6pm go to [email or web address] - password [email or web address] [facility]  Office  [phone]  CC: Primary care physician; [place] Pink, MD

## 2019-02-19 NOTE — Progress Notes (Signed)
Jeanette Antigua, MD [address] [place], [place], [place], [address] [address], [place], [place], [place], [address] Phone: [phone]  Fax: [phone]   Subjective:  Patient reports 2 episodes of melanotic bowel movement yesterday.  No abdominal pain.  No nausea or vomiting.  Objective: Exam: Vital signs in last 24 hours: Vitals:   02/19/19 0349 02/19/19 0731 02/19/19 1154 02/19/19 1238  BP: (!) 167/100 (!) 158/90 (!) 111/93 117/79  Pulse:  77 90 60  Resp:  19  18  Temp: 98.4 F (36.9 C) 98 F (36.7 C)  (!) 95.2 F (35.1 C)  TempSrc: Oral Oral  Tympanic  SpO2: 100% 96%  (!) 89%  Weight:      Height:       Weight change:   Intake/Output Summary (Last 24 hours) at 02/19/2019 1242 Last data filed at 02/19/2019 0930 Gross per 24 hour  Intake 199.87 ml  Output 1150 ml  Net -950.13 ml    General: No acute distress, AAO x3 Abd: Soft, NT/ND, No HSM Skin: Warm, no rashes Neck: Supple, Trachea midline   Lab Results: Lab Results  Component Value Date   WBC 12.8 (H) 02/19/2019   HGB 8.6 (L) 02/19/2019   HCT 29.3 (L) 02/19/2019   MCV 97.3 02/19/2019   PLT 262 02/19/2019   Micro Results: No results found for this or any previous visit (from the past 240 hour(s)). Studies/Results: Dg Chest 2 View  Result Date: 02/17/2019 CLINICAL DATA:  Shortness of breath. Nausea. Blurred vision. Hypotension. EXAM: CHEST - 2 VIEW COMPARISON:  Two-view chest x-ray 01/08/2018 FINDINGS: Heart is enlarged. Ectasia of the thoracic aorta is again seen. There is no edema or effusion. No focal airspace disease is present. The visualized soft tissues and bony thorax are unremarkable. IMPRESSION: 1. Cardiomegaly without failure. 2. 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 Patient has a known chronic dissection. Electronically Signed   By: San Morelle M.D.   On: 02/17/2019 18:33   Dg Duanne Limerick W Double Cm (hd Ba)  Result Date:  02/18/2019 CLINICAL DATA:  Black dark stools EXAM: UPPER GI SERIES WITHOUT KUB TECHNIQUE: Routine upper GI series was performed with high density barium. Effervescent crystals were utilized as well. FLUOROSCOPY TIME:  Fluoroscopy Time:  5 minutes 36 seconds Radiation Exposure Index (if provided by the fluoroscopic device): 190.2 mGy Number of Acquired Spot Images: 17, multiple cine fluoroscopic runs COMPARISON:  None. FINDINGS: Swallowing mechanism is within normal limits. No laryngeal perforation or tracheal aspiration is seen. The esophageal mucosa is unremarkable. Mild extrinsic compression upon the esophagus is noted at the level of the cardiac shadow in part due to atrial enlargement as well as tortuosity of the thoracic aorta. This creates a functional narrowing. The stomach distends well and demonstrates no focal gastric ulcer. Some mild irregularity of the stomach mucosa is seen likely related to mild gastritis. Duodenal bulb and duodenal sweep demonstrate no evidence of focal ulcer. IMPRESSION: No significant ulcer disease is identified. Mild changes of gastritis. Functional narrowing in the distal esophagus related to cardiac enlargement and tortuous thoracic aorta causing extrinsic compression. Electronically Signed   By: Inez Catalina M.D.   On: 02/18/2019 10:59   Medications:  Scheduled Meds: . [MAR Hold] ALPRAZolam  0.25 mg Oral BID  . [MAR Hold] amLODipine  5 mg Oral Daily  . [MAR Hold] cloNIDine  0.2 mg Oral BID  . [MAR Hold] feeding supplement  1 Container Oral  TID BM  . [MAR Hold] folic acid  1 mg Oral Daily  . [MAR Hold] methimazole  5 mg Oral Daily  . [MAR Hold] metoprolol tartrate  25 mg Oral BID  . [MAR Hold] multivitamin with minerals  1 tablet Oral Daily  . [MAR Hold] pantoprazole (PROTONIX) IV  40 mg Intravenous Q12H  . [MAR Hold] rosuvastatin  5 mg Oral QHS   Continuous Infusions: . sodium chloride     PRN Meds:.[MAR Hold] acetaminophen **OR** [MAR Hold] acetaminophen, [MAR  Hold] albuterol, [MAR Hold] bisacodyl, [MAR Hold] diphenhydrAMINE, [MAR Hold] HYDROcodone-acetaminophen, [MAR Hold] ondansetron **OR** [MAR Hold] ondansetron (ZOFRAN) IV, [MAR Hold] senna-docusate   Assessment: Active Problems:   Symptomatic anemia   Acute blood loss anemia   Demand ischemia of myocardium (HCC)    Plan: Upper GI study was negative. Patient was evaluated by cardiology and they have cleared her for endoscopic procedures Patient will be evaluated by anesthesia today and if they are agreeable, we will proceed with upper endoscopy today to evaluate source of melena and anemia If EGD is negative, patient will need colonoscopy for further evaluation  I have discussed alternative options, risks & benefits,  which include, but are not limited to, bleeding, infection, perforation,respiratory complication & drug reaction.  The patient and family agrees with this plan & written consent will be obtained.    Continue PPI twice daily Continue serial CBCs   LOS: 2 days   Jeanette Antigua, MD 02/19/2019, 12:42 PM

## 2019-02-19 NOTE — Anesthesia Post-op Follow-up Note (Signed)
Anesthesia QCDR form completed.        

## 2019-02-19 NOTE — Anesthesia Postprocedure Evaluation (Signed)
Anesthesia Post Note  Patient: Jeanette Romero  Procedure(s) Performed: ESOPHAGOGASTRODUODENOSCOPY (EGD) (Left )  Patient location during evaluation: Endoscopy Anesthesia Type: General Level of consciousness: awake and alert Pain management: pain level controlled Vital Signs Assessment: post-procedure vital signs reviewed and stable Respiratory status: spontaneous breathing, nonlabored ventilation, respiratory function stable and patient connected to nasal cannula oxygen Cardiovascular status: blood pressure returned to baseline and stable Postop Assessment: no apparent nausea or vomiting Anesthetic complications: no     Last Vitals:  Vitals:   02/19/19 1409 02/19/19 1539  BP: 137/78 118/76  Pulse: 90 87  Resp: 16 19  Temp: 37.1 C 36.7 C  SpO2: 91% 92%    Last Pain:  Vitals:   02/19/19 1539  TempSrc: Oral  PainSc:                  Netanel Yannuzzi S

## 2019-02-19 NOTE — Transfer of Care (Signed)
Immediate Anesthesia Transfer of Care Note  Patient: Jeanette Romero  Procedure(s) Performed: ESOPHAGOGASTRODUODENOSCOPY (EGD) (Left )  Patient Location: PACU  Anesthesia Type:General  Level of Consciousness: awake and alert   Airway & Oxygen Therapy: Patient Spontanous Breathing and Patient connected to nasal cannula oxygen  Post-op Assessment: Report given to RN and Post -op Vital signs reviewed and stable  Post vital signs: Reviewed and stable  Last Vitals:  Vitals Value Taken Time  BP 108/73 02/19/2019  1:11 PM  Temp    Pulse 97 02/19/2019  1:13 PM  Resp 25 02/19/2019  1:13 PM  SpO2 93 % 02/19/2019  1:13 PM  Vitals shown include unvalidated device data.  Last Pain:  Vitals:   02/19/19 1238  TempSrc: Tympanic  PainSc: 0-No pain         Complications: No apparent anesthesia complications

## 2019-02-20 ENCOUNTER — Inpatient Hospital Stay: Payer: Medicare Other | Admitting: Certified Registered"

## 2019-02-20 ENCOUNTER — Inpatient Hospital Stay: Payer: Medicare Other

## 2019-02-20 ENCOUNTER — Encounter
Admission: EM | Disposition: A | Discharge status: Home / Self Care | Payer: Self-pay | Source: Home / Self Care | Attending: Internal Medicine

## 2019-02-20 ENCOUNTER — Encounter: Payer: Self-pay | Admitting: *Deleted

## 2019-02-20 DIAGNOSIS — R778 Other specified abnormalities of plasma proteins: Secondary | ICD-10-CM

## 2019-02-20 DIAGNOSIS — N189 Chronic kidney disease, unspecified: Secondary | ICD-10-CM

## 2019-02-20 DIAGNOSIS — R7989 Other specified abnormal findings of blood chemistry: Secondary | ICD-10-CM

## 2019-02-20 DIAGNOSIS — K921 Melena: Secondary | ICD-10-CM

## 2019-02-20 HISTORY — PX: COLONOSCOPY: SHX5424

## 2019-02-20 LAB — CBC
HCT: 28.5 % — ABNORMAL LOW (ref 36.0–46.0)
Hemoglobin: 8.6 g/dL — ABNORMAL LOW (ref 12.0–15.0)
MCH: 30 pg (ref 26.0–34.0)
MCHC: 30.2 g/dL (ref 30.0–36.0)
MCV: 99.3 fL (ref 80.0–100.0)
Platelets: 221 10*3/uL (ref 150–400)
RBC: 2.87 MIL/uL — ABNORMAL LOW (ref 3.87–5.11)
RDW: 16.7 % — ABNORMAL HIGH (ref 11.5–15.5)
WBC: 9 10*3/uL (ref 4.0–10.5)
nRBC: 0.8 % — ABNORMAL HIGH (ref 0.0–0.2)

## 2019-02-20 LAB — BASIC METABOLIC PANEL
Anion gap: 8 (ref 5–15)
BUN: 61 mg/dL — ABNORMAL HIGH (ref 8–23)
CHLORIDE: 113 mmol/L — AB (ref 98–111)
CO2: 19 mmol/L — ABNORMAL LOW (ref 22–32)
CREATININE: 2.2 mg/dL — AB (ref 0.44–1.00)
Calcium: 9 mg/dL (ref 8.9–10.3)
GFR calc Af Amer: 26 mL/min — ABNORMAL LOW (ref >60)
GFR calc non Af Amer: 22 mL/min — ABNORMAL LOW (ref >60)
Glucose, Bld: 92 mg/dL (ref 70–99)
Potassium: 4.9 mmol/L (ref 3.5–5.1)
Sodium: 140 mmol/L (ref 135–145)

## 2019-02-20 LAB — PROTIME-INR
INR: 2 — ABNORMAL HIGH (ref 0.8–1.2)
Prothrombin Time: 22.2 seconds — ABNORMAL HIGH (ref 11.4–15.2)

## 2019-02-20 SURGERY — COLONOSCOPY
Anesthesia: General | Laterality: Left

## 2019-02-20 MED ORDER — FLEET ENEMA 7-19 GM/118ML RE ENEM
2.0000 | ENEMA | Freq: Once | RECTAL | Status: AC
  Administered 2019-02-20: 1 via RECTAL
  Administered 2019-02-20: 2 via RECTAL

## 2019-02-20 MED ORDER — FUROSEMIDE 20 MG PO TABS
20.0000 mg | ORAL_TABLET | Freq: Every day | ORAL | Status: DC
  Administered 2019-02-21: 20 mg via ORAL
  Filled 2019-02-20: qty 1

## 2019-02-20 MED ORDER — BISACODYL 5 MG PO TBEC
10.0000 mg | DELAYED_RELEASE_TABLET | Freq: Once | ORAL | Status: AC
  Administered 2019-02-20: 10 mg via ORAL
  Filled 2019-02-20 (×2): qty 2

## 2019-02-20 MED ORDER — SODIUM CHLORIDE 0.9 % IV SOLN
INTRAVENOUS | Status: DC
  Administered 2019-02-20: 11:00:00 via INTRAVENOUS

## 2019-02-20 MED ORDER — PROPOFOL 10 MG/ML IV BOLUS
INTRAVENOUS | Status: DC | PRN
  Administered 2019-02-20 (×3): 30 mg via INTRAVENOUS
  Administered 2019-02-20: 20 mg via INTRAVENOUS
  Administered 2019-02-20 (×5): 30 mg via INTRAVENOUS

## 2019-02-20 MED ORDER — SODIUM CHLORIDE 0.9 % IV SOLN: INTRAVENOUS | Status: DC

## 2019-02-20 MED ORDER — PHENYLEPHRINE HCL 10 MG/ML IJ SOLN
INTRAMUSCULAR | Status: DC | PRN
  Administered 2019-02-20 (×3): 100 ug via INTRAVENOUS

## 2019-02-20 MED ORDER — POLYETHYLENE GLYCOL 3350 17 GM/SCOOP PO POWD
1.0000 | Freq: Once | ORAL | Status: AC
  Administered 2019-02-20: 255 g via ORAL
  Filled 2019-02-20 (×2): qty 255

## 2019-02-20 MED ORDER — FUROSEMIDE 10 MG/ML IJ SOLN
40.0000 mg | Freq: Once | INTRAMUSCULAR | Status: AC
  Administered 2019-02-20: 40 mg via INTRAVENOUS
  Filled 2019-02-20: qty 4

## 2019-02-20 NOTE — Transfer of Care (Signed)
Immediate Anesthesia Transfer of Care Note  Patient: Jeanette Romero Staffa  Procedure(s) Performed: COLONOSCOPY (Left )  Patient Location: Endoscopy Unit  Anesthesia Type:General  Level of Consciousness: awake and patient cooperative  Airway & Oxygen Therapy: Patient Spontanous Breathing and Patient connected to face mask oxygen  Post-op Assessment: Report given to RN and Post -op Vital signs reviewed and stable  Post vital signs: Reviewed and stable  Last Vitals:  Vitals Value Taken Time  BP 123/83 02/20/2019 12:50 PM  Temp 36.6 C 02/20/2019 12:50 PM  Pulse 76 02/20/2019 12:52 PM  Resp 23 02/20/2019 12:52 PM  SpO2 96 % 02/20/2019 12:52 PM  Vitals shown include unvalidated device data.  Last Pain:  Vitals:   02/20/19 1250  TempSrc: Tympanic  PainSc:          Complications: No apparent anesthesia complications

## 2019-02-20 NOTE — Anesthesia Post-op Follow-up Note (Signed)
Anesthesia QCDR form completed.        

## 2019-02-20 NOTE — Care Management Important Message (Signed)
Important Message  Patient Details  Name: Jeanette Romero MRN: [id number] Date of Birth: Mar 06, 1951   Medicare Important Message Given:  Yes    [name] 02/20/2019, 10:37 AM

## 2019-02-20 NOTE — Progress Notes (Signed)
2nd fleets enema given in endo pre. Expelled liquid black flakes.

## 2019-02-20 NOTE — Progress Notes (Signed)
[facility] NAME: Jeanette Romero    MR#:  [id number]  DATE OF BIRTH:  [birth year]  SUBJECTIVE:  CHIEF COMPLAINT:   Chief Complaint  Patient presents with   Shortness of Breath   -Globin remained stable.  Patient going for colonoscopy today -Still remains on 2 L O2 which is acute. -Complains of fatigue  REVIEW OF SYSTEMS:  Review of Systems  Constitutional: Positive for malaise/fatigue. Negative for chills and fever.  HENT: Negative for congestion, ear discharge, hearing loss and nosebleeds.   Eyes: Negative for blurred vision and double vision.  Respiratory: Negative for cough, shortness of breath and wheezing.   Cardiovascular: Negative for chest pain and palpitations.  Gastrointestinal: Positive for melena. Negative for abdominal pain, constipation, diarrhea, nausea and vomiting.  Genitourinary: Negative for dysuria.  Musculoskeletal: Negative for myalgias.  Neurological: Negative for dizziness, focal weakness, seizures, weakness and headaches.  Psychiatric/Behavioral: Negative for depression.    DRUG ALLERGIES:   Allergies  Allergen Reactions   Atorvastatin     Other reaction(s): Liver Disorder Elevated liver enzymes    VITALS:  Blood pressure 138/89, pulse 86, temperature 98.3 F (36.8 C), temperature source Oral, resp. rate 18, height 5\' 2"  (1.575 m), weight 78 kg, SpO2 95 %.  PHYSICAL EXAMINATION:  Physical Exam   GENERAL:  68 y.o.-year-old patient lying in the bed with no acute distress.  EYES: Pupils equal, round, reactive to light and accommodation. No scleral icterus. Extraocular muscles intact.  HEENT: Head atraumatic, normocephalic. Oropharynx and nasopharynx clear.  NECK:  Supple, no jugular venous distention. No thyroid enlargement, no tenderness.  LUNGS: Normal breath sounds bilaterally, no wheezing, rales,rhonchi or crepitation. No use of accessory muscles of respiration.  Decreased bibasilar  breath sounds CARDIOVASCULAR: S1, S2 normal. No  rubs, or gallops.  Loud 3/6 systolic murmur in the aortic area ABDOMEN: Soft, nontender, nondistended. Bowel sounds present. No organomegaly or mass.  EXTREMITIES: No pedal edema, cyanosis, or clubbing.  NEUROLOGIC: Cranial nerves II through XII are intact. Muscle strength 5/5 in all extremities. Sensation intact. Gait not checked.  PSYCHIATRIC: The patient is alert and oriented x 3.  SKIN: No obvious rash, lesion, or ulcer.    LABORATORY PANEL:   CBC Recent Labs  Lab 02/20/19 0408  WBC 9.0  HGB 8.6*  HCT 28.5*  PLT 221   ------------------------------------------------------------------------------------------------------------------  Chemistries  Recent Labs  Lab 02/20/19 0408  NA 140  K 4.9  CL 113*  CO2 19*  GLUCOSE 92  BUN 61*  CREATININE 2.20*  CALCIUM 9.0   ------------------------------------------------------------------------------------------------------------------  Cardiac Enzymes Recent Labs  Lab 02/18/19 0550  TROPONINI 1.47*   ------------------------------------------------------------------------------------------------------------------  RADIOLOGY:  No results found.  EKG:   Orders placed or performed during the hospital encounter of 02/17/19   ED EKG   ED EKG   EKG 12-Lead   EKG 12-Lead    ASSESSMENT AND PLAN:   68 year old female with past medical history significant for chronic anemia, CKD stage IV, hypertension, atrial fibrillation on Coumadin, history of mechanical aortic valve on anticoagulation, hypertension presents to hospital secondary to weakness and noted to have anemia   1.  Acute on chronic anemia-has been having melanotic stools.  - No prior EGD or colonoscopy -INR is 2, Coumadin is on hold.  Discussed with cardiology.  No vitamin K or FFP. -Received 2 units packed RBC transfusion.  Hemoglobin is stable at 8 -Appreciate GI consult.   -Status post EGD yesterday  showing minimal gastritis and possible Barrett's esophagus-biopsies not done due to elevated INR and recent bleed.  Will need outpatient follow-up and repeat EGD for biopsies.  No active source of bleeding noted. -Patient going for colonoscopy today -Change Protonix to p.o.  2.  Elevated troponin-likely demand ischemia.  Troponins have plateaued. -Cardiac catheterization prior to her aortic valve replacement in 2016 showing mild to moderate coronary disease -No cardiac intervention at this time. -Follow cardiology recommendations.  3.  Paroxysmal atrial fibrillation-failed cardioversion twice.  Rate fluctuating.  On low-dose metoprolol -.  Hold off on anticoagulation at this time.  Restart at discharge after the GI procedures -No active bleeding noted.  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  Hyperthyroidism-continue methimazole.  Normal TSH and T4 levels  5. CKD stage IV-nephrology has been consulted. hold nephrotoxins. -Continue outpatient follow-up.  Renal function is stable  6.  DVT prophylaxis-teds and SCDs only  7.  Hypertension-monitor, home meds restarted.  On Norvasc, clonidine and metoprolol  Ambulatory at baseline Continue to wean off oxygen and encourage ambulation   All the records are reviewed and case discussed with Care Management/Social Workerr. Management plans discussed with the patient, family and they are in agreement.  CODE STATUS: Full code  TOTAL TIME TAKING CARE OF THIS PATIENT: 39 minutes.   POSSIBLE D/C tomorrow, DEPENDING ON CLINICAL CONDITION.   Gladstone Lighter M.D on 02/20/2019 at 12:05 PM  Between 7am to 6pm - Pager - [phone]  After 6pm go to [email or web address] - password [email or web address] [facility]  Office  [phone]  CC: Primary care physician; [place] Pink, MD

## 2019-02-20 NOTE — Anesthesia Preprocedure Evaluation (Signed)
Anesthesia Evaluation  Patient identified by MRN, date of birth, ID band Patient awake    Reviewed: Allergy & Precautions, NPO status , Patient's Chart, lab work & pertinent test results  History of Anesthesia Complications Negative for: history of anesthetic complications  Airway Mallampati: II  TM Distance: >3 FB Neck ROM: Full    Dental no notable dental hx.    Pulmonary neg sleep apnea, neg COPD, former smoker,    breath sounds clear to auscultation- rhonchi (-) wheezing      Cardiovascular hypertension, + CAD  (-) Past MI, (-) Cardiac Stents and (-) CABG  Rhythm:Regular Rate:Normal - Systolic murmurs and - Diastolic murmurs    Neuro/Psych neg Seizures PSYCHIATRIC DISORDERS Anxiety Depression negative neurological ROS     GI/Hepatic Neg liver ROS, GERD  ,  Endo/Other  neg diabetesHyperthyroidism   Renal/GU Renal InsufficiencyRenal disease     Musculoskeletal negative musculoskeletal ROS (+)   Abdominal (+) + obese,   Peds  Hematology  (+) anemia ,   Anesthesia Other Findings Past Medical History: 11/14/2017: Anemia in chronic kidney disease (CKD) No date: Anxiety No date: Aortic valve regurgitation 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 No date: Chronic kidney disease No date: Coronary artery disease No date: Dizziness No date: Edema No date: GERD (gastroesophageal reflux disease) No date: Gout No date: Hypercholesterolemia No date: Hypertension No date: Shortness of breath dyspnea   Reproductive/Obstetrics                             Anesthesia Physical Anesthesia Plan  ASA: III  Anesthesia Plan: General   Post-op Pain Management:    Induction: Intravenous  PONV Risk Score and Plan: 2 and Propofol infusion  Airway Management Planned: Natural Airway  Additional Equipment:   Intra-op Plan:   Post-operative Plan:   Informed Consent: I have reviewed the patients History  and Physical, chart, labs and discussed the procedure including the risks, benefits and alternatives for the proposed anesthesia with the patient or authorized representative who has indicated his/her understanding and acceptance.     Dental advisory given  Plan Discussed with: CRNA and Anesthesiologist  Anesthesia Plan Comments:         Anesthesia Quick Evaluation

## 2019-02-20 NOTE — Progress Notes (Signed)
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  She also has a history of chronic kidney disease, atrial fibrillation, hypertension who was admitted with GI bleed.  She is followed at [facility].  Hemoglobin on admission was 6.1.  Creatinine was 2.68 up from baseline of approximately 2 with a GFR of 20.  Serum troponin mildly elevated at 1.35 increased to 1.47.  INR was 1.9 on admission.  Chest x-ray showed cardiomegaly without congestive heart failure.  EKG showed atrial fibrillation with incomplete left bundle branch block.  She is on an aggressive antihypertensive regimen as an outpatient including amlodipine 5 mg daily, benazepril 40 mg daily, clonidine 0.3 mg 3 times daily, furosemide 20 mg daily, hydralazine 100 mg 3 times daily.  Underwent endoscopy yesterday without problem.  Findings revealed short Barrett's esophagus.  Stomach was normal, duodenal bulb was without abnormality.  No obvious bleeding.  For colonoscopy today.  INR is 2.0 today.  Hemoglobin 8.6.  Creatinine 2.2.  Subjective   Hemodynamically stable.  Blood pressure in better control.  Inpatient Medications    . ALPRAZolam  0.25 mg Oral BID  . amLODipine  5 mg Oral Daily  . cloNIDine  0.2 mg Oral BID  . feeding supplement  1 Container Oral TID BM  . folic acid  1 mg Oral Daily  . methimazole  5 mg Oral Daily  . metoprolol tartrate  25 mg Oral BID  . multivitamin with minerals  1 tablet Oral Daily  . pantoprazole  20 mg Oral Daily  . rosuvastatin  5 mg Oral QHS    Vital Signs    Vitals:    02/19/19 1409 02/19/19 1539 02/19/19 1945 02/20/19 0457  BP: 137/78 118/76 (!) 143/79 118/79  Pulse: 90 87 88 75  Resp: 16 19 16 16   Temp: 98.8 F (37.1 C) 98.1 F (36.7 C) 98.6 F (37 C) 98.4 F (36.9 C)  TempSrc: Oral Oral Oral Oral  SpO2: 91% 92% 90% 90%  Weight:    78 kg  Height:        Intake/Output Summary (Last 24 hours) at 02/20/2019 0752 Last data filed at 02/20/2019 0200 Gross per 24 hour  Intake 300 ml  Output 700 ml  Net -400 ml   Filed Weights   02/17/19 1535 02/17/19 2252 02/20/19 0457  Weight: 78 kg 76 kg 78 kg    Physical Exam    GEN: Well nourished, well developed, in no acute distress.  HEENT: normal.  Neck: Supple, no JVD, carotid bruits, or masses. Cardiac: Irregular irregular rhythm with mechanical heart sounds.  Crisp prosthetic valve sounds. Respiratory:  Respirations regular and unlabored, clear to auscultation bilaterally. GI: Soft, nontender, nondistended, BS + x 4. MS: no deformity or atrophy. Skin: warm and dry, no rash. Neuro:  Strength and sensation are intact. Psych: Normal affect.  Labs    CBC Recent Labs    02/19/19 0526 02/20/19 0408  WBC 12.8* 9.0  HGB 8.6* 8.6*  HCT 29.3* 28.5*  MCV 97.3 99.3  PLT 262 086   Basic Metabolic Panel  Recent Labs    02/19/19 0526 02/20/19 0408  NA 144 140  K 4.3 4.9  CL 114* 113*  CO2 18* 19*  GLUCOSE 104* 92  BUN 68* 61*  CREATININE 2.17* 2.20*  CALCIUM 9.2 9.0   Liver Function Tests No results for input(s): AST, ALT, ALKPHOS, BILITOT, PROT, ALBUMIN in the last 72 hours. No results for input(s): LIPASE, AMYLASE in the last 72 hours. Cardiac Enzymes Recent Labs    02/17/19 1537 02/18/19 0038 02/18/19 0550  TROPONINI 1.35* 1.35* 1.47*   BNP No results for input(s): BNP in the last 72 hours. D-Dimer No results for input(s): DDIMER in the last 72 hours. Hemoglobin A1C No results for input(s): HGBA1C in the last 72 hours. Fasting Lipid Panel No results for input(s): CHOL,  HDL, LDLCALC, TRIG, CHOLHDL, LDLDIRECT in the last 72 hours. Thyroid Function Tests Recent Labs    02/18/19 1329  TSH 1.421    Telemetry    Atrial fibrillation with controlled ventricular response  ECG    Atrial fibrillation with incomplete left bundle branch block  Radiology    Dg Chest 2 View  Result Date: 02/17/2019 CLINICAL DATA:  Shortness of breath. Nausea. Blurred vision. Hypotension. EXAM: CHEST - 2 VIEW COMPARISON:  Two-view chest x-ray 01/08/2018 FINDINGS: Heart is enlarged. Ectasia of the thoracic aorta is again seen. There is no edema or effusion. No focal airspace disease is present. The visualized soft tissues and bony thorax are unremarkable. IMPRESSION: 1. Cardiomegaly without failure. 2. 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 Patient has a known chronic dissection. Electronically Signed   By: San Morelle M.D.   On: 02/17/2019 18:33   Dg Duanne Limerick W Double Cm (hd Ba)  Result Date: 02/18/2019 CLINICAL DATA:  Black dark stools EXAM: UPPER GI SERIES WITHOUT KUB TECHNIQUE: Routine upper GI series was performed with high density barium. Effervescent crystals were utilized as well. FLUOROSCOPY TIME:  Fluoroscopy Time:  5 minutes 36 seconds Radiation Exposure Index (if provided by the fluoroscopic device): 190.2 mGy Number of Acquired Spot Images: 17, multiple cine fluoroscopic runs COMPARISON:  None. FINDINGS: Swallowing mechanism is within normal limits. No laryngeal perforation or tracheal aspiration is seen. The esophageal mucosa is unremarkable. Mild extrinsic compression upon the esophagus is noted at the level of the cardiac shadow in part due to atrial enlargement as well as tortuosity of the thoracic aorta. This creates a functional narrowing. The stomach distends well and demonstrates no focal gastric ulcer. Some mild irregularity of the stomach mucosa is seen likely related to mild gastritis. Duodenal bulb and duodenal  sweep demonstrate no evidence of focal ulcer. IMPRESSION: No significant ulcer disease is identified. Mild changes of gastritis. Functional narrowing in the distal esophagus related to cardiac enlargement and tortuous thoracic aorta causing extrinsic compression. Electronically Signed   By: Inez Catalina M.D.   On: 02/18/2019 10:59    Assessment & Plan    68 year old female with history of aortic dissection with aortic insufficiency resulting in aortic valve replacement with a mechanical device, history of atrial fibrillation, history of chronic anticoagulation with warfarin, history of hypertension, history of chronic kidney disease admitted with melena and acute GI bleed.  INR was 1.9 on presentation.  Anemia-secondary to GI bleed.  Would hold warfarin for now and resume as soon as bleeding is stabilized.  INR goal between 2 and 3 given aortic prosthesis.  She also has atrial fibrillation.  Endoscopy showed no active bleeding.  Run Barrett's esophagus.  Stomach and duodenal bulb were unremarkable.  Colonoscopy today.  Hemoglobin has stabilized.  INR is 2.0  Elevated troponin-likely secondary to demand.  Does not appear to have an acute coronary syndrome.  Cardiac catheterization in 2016 revealed noncritical disease.  We will continue to follow for further symptoms.  Hypertension-patient was on an aggressive antihypertensive regimen at home.  Will need to resume her antihypertensive regimen slowly.  Will continue with current antihypertensive regimen and follow.  Will likely need to add back to remainder of her antihypertensive regimen as her hemodynamics stabilize  Aortic valve disease-status post mechanical prosthesis.  Would continue to hold warfarin as you are doing until GI bleed stabilizes.  Will need to resume this as soon as possible.  INR goal for an aortic mechanical prosthesis is 2-3.  Atrial fibrillation-rate is controlled at present.  Will follow.  As above, when stable will add warfarin  back with an INR goal between 2 and 3  Chronic kidney disease-appears to have acute on chronic likely secondary to GI blood loss.  Baseline appears to be a GFR of approximately 30.  Currently 26.    Signed, Javier Docker Shiquita Collignon MD 02/20/2019, 7:52 AM  Pager: [phone]

## 2019-02-20 NOTE — Progress Notes (Signed)
Hematology/Oncology Progress Note [facility] Telephone:([phone] [phone]  Patient Care Team: Maryland Pink, MD as PCP - General (Family Medicine)   Name of the patient: Jeanette Romero  [id number]  September 16, 1951  Date of visit: 02/20/19   INTERVAL HISTORY-  Patient was seen in the bedside.  Son at bedside. Patient had EGD done on 02/19/2019 at colonoscopy done this morning. EGD shows esophageal mucosal changes suspicious for short segment Barrett esophagus  Colonoscopy showed black stool/melena, no red blood or active bleeding noted.  Due to the large amount of stool, this was not an adequate examination to rule underlying lesions.  Plan to repeat colonoscopy tomorrow.  Patient reports feeling drowsy, still recovering from the procedure.  Also reports fatigue and weakness.  Review of systems- Review of Systems  Constitutional: Positive for fatigue. Negative for appetite change, chills and fever.  HENT:   Negative for hearing loss and voice change.   Eyes: Negative for eye problems.  Respiratory: Negative for chest tightness and cough.   Cardiovascular: Negative for chest pain.  Gastrointestinal: Negative for abdominal distention and abdominal pain.       Black stool  Endocrine: Negative for hot flashes.  Genitourinary: Negative for difficulty urinating and frequency.   Musculoskeletal: Negative for arthralgias.  Skin: Negative for itching and rash.  Neurological: Negative for extremity weakness.  Hematological: Negative for adenopathy.  Psychiatric/Behavioral: Negative for confusion.    Allergies  Allergen Reactions  . Atorvastatin     Other reaction(s): Liver Disorder Elevated liver enzymes    Patient Active Problem List   Diagnosis Date Noted  . Columnar epithelial-lined lower esophagus   . Melena   . Acute blood loss anemia   . Demand ischemia of myocardium ([place])   . Symptomatic anemia 02/17/2019  . Anemia in chronic kidney  disease (CKD) 11/14/2017  . Normocytic anemia 10/31/2017  . Fatigue associated with anemia 10/31/2017  . Depression 10/31/2017  . Hyperthyroidism 10/31/2017  . Chronic anticoagulation 10/31/2017  . Bradycardia 05/05/2015     Past Medical History:  Diagnosis Date  . Anemia in chronic kidney disease (CKD) 11/14/2017  . Anxiety   . Aortic valve regurgitation   . Aortic valve replaced   . Chronic kidney disease   . Coronary artery disease   . Dizziness   . Edema   . GERD (gastroesophageal reflux disease)   . Gout   . Hypercholesterolemia   . Hypertension   . Shortness of breath dyspnea      Past Surgical History:  Procedure Laterality Date  . ABDOMINAL HYSTERECTOMY    . AORTIC VALVE REPLACEMENT    . CARDIAC CATHETERIZATION    . CARDIAC CATHETERIZATION N/A 05/07/2015   Procedure: Left Heart Cath;  Surgeon: [name], MD;  Location: [facility];  Service: Cardiovascular;  Laterality: N/A;  . CARDIAC VALVE REPLACEMENT    . ELECTROPHYSIOLOGIC STUDY N/A 05/05/2015   Procedure: Cardioversion;  Surgeon: [name], MD;  Location: [facility] ORS;  Service: Cardiovascular;  Laterality: N/A;  . ESOPHAGOGASTRODUODENOSCOPY Left 02/19/2019   Procedure: ESOPHAGOGASTRODUODENOSCOPY (EGD);  Surgeon: Virgel Manifold, MD;  Location: [facility] ENDOSCOPY;  Service: Endoscopy;  Laterality: Left;  . REPLACEMENT TOTAL KNEE BILATERAL      Social History   Socioeconomic History  . Marital status: Married    Spouse name: Not on file  . Number of children: Not on file  . Years of education: Not on file  . Highest education level: Not on file  Occupational History  . Not on file  Social Needs  . Financial resource strain: Not on file  . Food insecurity:    Worry: Not on file    Inability: Not on file  . Transportation needs:    Medical: Not on file    Non-medical: Not on file  Tobacco Use  . Smoking status: Former Smoker    Packs/day: 0.50    Years: 20.00    Pack years: 10.00     Types: Cigarettes    Last attempt to quit: 2004    Years since quitting: 16.2  . Smokeless tobacco: Never Used  Substance and Sexual Activity  . Alcohol use: Yes    Alcohol/week: 0.0 standard drinks    Comment: rare  . Drug use: No  . Sexual activity: Not on file  Lifestyle  . Physical activity:    Days per week: Not on file    Minutes per session: Not on file  . Stress: Not on file  Relationships  . Social connections:    Talks on phone: Not on file    Gets together: Not on file    Attends religious service: Not on file    Active member of club or organization: Not on file    Attends meetings of clubs or organizations: Not on file    Relationship status: Not on file  . Intimate partner violence:    Fear of current or ex partner: Not on file    Emotionally abused: Not on file    Physically abused: Not on file    Forced sexual activity: Not on file  Other Topics Concern  . Not on file  Social History Narrative  . Not on file     Family History  Problem Relation Age of Onset  . Hypertension Mother   . Colon cancer Mother   . Prostate cancer Father   . Kidney cancer Brother   . Prostate cancer Brother   . Prostate cancer Brother   . Breast cancer Neg Hx      Current Facility-Administered Medications:  .  acetaminophen (TYLENOL) tablet 650 mg, 650 mg, Oral, Q6H PRN, 650 mg at 02/18/19 2123 **OR** acetaminophen (TYLENOL) suppository 650 mg, 650 mg, Rectal, Q6H PRN, Demetrios Loll, MD .  albuterol (PROVENTIL) (2.5 MG/3ML) 0.083% nebulizer solution 2.5 mg, 2.5 mg, Nebulization, Q2H PRN, Demetrios Loll, MD .  ALPRAZolam Duanne Moron) tablet 0.25 mg, 0.25 mg, Oral, BID, Demetrios Loll, MD, 0.25 mg at 02/20/19 0903 .  amLODipine (NORVASC) tablet 5 mg, 5 mg, Oral, Daily, Fath, Javier Docker, MD, 5 mg at 02/20/19 0902 .  bisacodyl (DULCOLAX) EC tablet 5 mg, 5 mg, Oral, Daily PRN, Demetrios Loll, MD .  cloNIDine (CATAPRES) tablet 0.2 mg, 0.2 mg, Oral, BID, Teodoro Spray, MD, 0.2 mg at 02/20/19 0903  .  diphenhydrAMINE (BENADRYL) capsule 50 mg, 50 mg, Oral, QHS PRN, Demetrios Loll, MD, 50 mg at 02/18/19 2125 .  feeding supplement (BOOST / RESOURCE BREEZE) liquid 1 Container, 1 Container, Oral, TID BM, Gladstone Lighter, MD, 1 Container at 02/18/19 1518 .  folic acid (FOLVITE) tablet 1 mg, 1 mg, Oral, Daily, Demetrios Loll, MD, 1 mg at 02/20/19 0904 .  [START ON 02/21/2019] furosemide (LASIX) tablet 20 mg, 20 mg, Oral, Daily, Kalisetti, Radhika, MD .  HYDROcodone-acetaminophen (NORCO/VICODIN) 5-325 MG per tablet 1-2 tablet, 1-2 tablet, Oral, Q4H PRN, Demetrios Loll, MD, 2 tablet at 02/19/19 0400 .  methimazole (TAPAZOLE) tablet 5 mg, 5 mg, Oral, Daily, Demetrios Loll, MD,  5 mg at 02/20/19 0902 .  metoprolol tartrate (LOPRESSOR) tablet 25 mg, 25 mg, Oral, BID, Gladstone Lighter, MD, 25 mg at 02/20/19 0903 .  multivitamin with minerals tablet 1 tablet, 1 tablet, Oral, Daily, Gladstone Lighter, MD, 1 tablet at 02/20/19 0903 .  ondansetron (ZOFRAN) tablet 4 mg, 4 mg, Oral, Q6H PRN **OR** ondansetron (ZOFRAN) injection 4 mg, 4 mg, Intravenous, Q6H PRN, Demetrios Loll, MD, 4 mg at 02/19/19 0401 .  pantoprazole (PROTONIX) EC tablet 20 mg, 20 mg, Oral, Daily, Tahiliani, Varnita B, MD, 20 mg at 02/20/19 0904 .  polyethylene glycol powder (GLYCOLAX/MIRALAX) container 255 g, 1 Container, Oral, Once, Tahiliani, Varnita B, MD .  rosuvastatin (CRESTOR) tablet 5 mg, 5 mg, Oral, QHS, Demetrios Loll, MD, 5 mg at 02/18/19 2124 .  senna-docusate (Senokot-S) tablet 1 tablet, 1 tablet, Oral, QHS PRN, Demetrios Loll, MD   Physical exam:  Vitals:   02/20/19 1250 02/20/19 1320 02/20/19 1352 02/20/19 1559  BP: 123/83 (!) 141/90 (!) 140/96 135/82  Pulse: 91  65 61  Resp: (!) 37  18 18  Temp: 97.8 F (36.6 C)   98.1 F (36.7 C)  TempSrc: Tympanic   Oral  SpO2: 95%  93% 100%  Weight:      Height:       Physical Exam  Constitutional: She is oriented to person, place, and time. No distress.  HENT:  Head: Normocephalic and atraumatic.   Nose: Nose normal.  Mouth/Throat: Oropharynx is clear and moist. No oropharyngeal exudate.  Eyes: Pupils are equal, round, and reactive to light. EOM are normal. No scleral icterus.  Neck: Normal range of motion. Neck supple.  Cardiovascular: Normal rate and regular rhythm.  Murmur heard. Pulmonary/Chest: Effort normal. No respiratory distress. She has no rales. She exhibits no tenderness.  Abdominal: Soft. She exhibits no distension. There is no abdominal tenderness.  Musculoskeletal: Normal range of motion.        General: No edema.  Neurological: She is alert and oriented to person, place, and time. No cranial nerve deficit. She exhibits normal muscle tone. Coordination normal.  Skin: Skin is warm and dry. She is not diaphoretic. No erythema.  Psychiatric: Affect normal.       CMP Latest Ref Rng & Units 02/20/2019  Glucose 70 - 99 mg/dL 92  BUN 8 - 23 mg/dL 61(H)  Creatinine 0.44 - 1.00 mg/dL 2.20(H)  Sodium 135 - 145 mmol/L 140  Potassium 3.5 - 5.1 mmol/L 4.9  Chloride 98 - 111 mmol/L 113(H)  CO2 22 - 32 mmol/L 19(L)  Calcium 8.9 - 10.3 mg/dL 9.0  Total Protein 6.5 - 8.1 g/dL -  Total Bilirubin 0.3 - 1.2 mg/dL -  Alkaline Phos 38 - 126 U/L -  AST 15 - 41 U/L -  ALT 0 - 44 U/L -   CBC Latest Ref Rng & Units 02/20/2019  WBC 4.0 - 10.5 K/uL 9.0  Hemoglobin 12.0 - 15.0 g/dL 8.6(L)  Hematocrit 36.0 - 46.0 % 28.5(L)  Platelets 150 - 400 K/uL 221   RADIOGRAPHIC STUDIES: I have personally reviewed the radiological images as listed and agreed with the findings in the report. Dg Chest 2 View  Result Date: 02/17/2019 CLINICAL DATA:  Shortness of breath. Nausea. Blurred vision. Hypotension. EXAM: CHEST - 2 VIEW COMPARISON:  Two-view chest x-ray 01/08/2018 FINDINGS: Heart is enlarged. Ectasia of the thoracic aorta is again seen. There is no edema or effusion. No focal airspace disease is present. The visualized soft tissues and bony thorax  are unremarkable. IMPRESSION: 1. Cardiomegaly  without failure. 2. 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 Patient has a known chronic dissection. Electronically Signed   By: San Morelle M.D.   On: 02/17/2019 18:33   Dg Duanne Limerick W Double Cm (hd Ba)  Result Date: 02/18/2019 CLINICAL DATA:  Black dark stools EXAM: UPPER GI SERIES WITHOUT KUB TECHNIQUE: Routine upper GI series was performed with high density barium. Effervescent crystals were utilized as well. FLUOROSCOPY TIME:  Fluoroscopy Time:  5 minutes 36 seconds Radiation Exposure Index (if provided by the fluoroscopic device): 190.2 mGy Number of Acquired Spot Images: 17, multiple cine fluoroscopic runs COMPARISON:  None. FINDINGS: Swallowing mechanism is within normal limits. No laryngeal perforation or tracheal aspiration is seen. The esophageal mucosa is unremarkable. Mild extrinsic compression upon the esophagus is noted at the level of the cardiac shadow in part due to atrial enlargement as well as tortuosity of the thoracic aorta. This creates a functional narrowing. The stomach distends well and demonstrates no focal gastric ulcer. Some mild irregularity of the stomach mucosa is seen likely related to mild gastritis. Duodenal bulb and duodenal sweep demonstrate no evidence of focal ulcer. IMPRESSION: No significant ulcer disease is identified. Mild changes of gastritis. Functional narrowing in the distal esophagus related to cardiac enlargement and tortuous thoracic aorta causing extrinsic compression. Electronically Signed   By: Inez Catalina M.D.   On: 02/18/2019 10:59    Assessment and plan-  Patient is a 68 y.o. female past medical history significant for anemia of chronic kidney disease, aortic valve regurgitation, CAD, atrial fibrillation, GERD, gout, hypertension, hyperlipidemia, chronic anticoagulation, currently admitted due to melena, acute on chronic anemia status post PRBC transfusion.  Hemoglobin responded appropriately.   #Acute on chronic anemia due to GI blood loss. She has had GI work-up including EGD and colonoscopy.  Due to the poor prep, repeat colonoscopy is planned.  There were melena in the colon. Anticoagulation is currently on hold.  Cardiology on board.  #Anemia due to chronic kidney disease, she has been on Procrit 60,000 unit every 4 weeks.  I checked her iron panel which shows adequate iron panel.  No need for IV iron infusion during the hospitalization.  I would not recommend Procrit during hospitalization due to thrombosis risk, especially when she is off Coumadin. Please transfuse if hemoglobin less than 7, or symptomatic.  Thank you for allowing me to participate in the care of this patient.   Earlie Server, MD, PhD Hematology Oncology [facility] Pager- [phone] 02/20/2019

## 2019-02-20 NOTE — Op Note (Addendum)
[facility] Patient Name: Jeanette Romero Procedure Date: 02/20/2019 12:10 PM MRN: [id number] Account #: [id number] Date of Birth: [birth year] Admit Type: Inpatient Age: 68 Room: [facility] ENDO ROOM 4 Gender: Female Note Status: Finalized Procedure:            Colonoscopy Indications:          Melena Providers:            Evangelia Whitaker B. Bonna Gains MD, MD Referring MD:         Irven Easterly. Kary Kos, MD (Referring MD) Medicines:            Monitored Anesthesia Care Complications:        No immediate complications. Procedure:            Pre-Anesthesia Assessment:                       - Prior to the procedure, a History and Physical was                        performed, and patient medications, allergies and                        sensitivities were reviewed. The patient's tolerance of                        previous anesthesia was reviewed.                       - The risks and benefits of the procedure and the                        sedation options and risks were discussed with the                        patient. All questions were answered and informed                        consent was obtained.                       - Patient identification and proposed procedure were                        verified prior to the procedure by the physician, the                        nurse, the anesthesiologist, the anesthetist and the                        technician. The procedure was verified in the                        pre-procedure area in the procedure room in the                        endoscopy suite.                       - Prophylactic Antibiotics: The patient does not  require prophylactic antibiotics.                       - ASA Grade Assessment: III - A patient with severe                        systemic disease.                       - After reviewing the risks and benefits, the patient                        was deemed in satisfactory  condition to undergo the                        procedure.                       - Monitored anesthesia care was determined to be                        medically necessary for this procedure based on review                        of the patient's medical history, medications, and                        prior anesthesia history.                       - The anesthesia plan was to use monitored anesthesia                        care (MAC).                       After obtaining informed consent, the colonoscope was                        passed under direct vision. Throughout the procedure,                        the patient's blood pressure, pulse, and oxygen                        saturations were monitored continuously. The                        Colonoscope was introduced through the anus and                        advanced to the the cecum, identified by appendiceal                        orifice and ileocecal valve. The colonoscopy was                        performed with ease. The patient tolerated the                        procedure well. The quality of the bowel preparation  was poor. Findings:      The perianal and digital rectal examinations were normal.      A large amount of stool was found in the entire colon, making       visualization difficult.      Hematin (altered blood/coffee-ground-like material) was found in the       entire colon.      A large amount of stool was found in the cecum, precluding visualization.      The retroflexed view of the distal rectum and anal verge was normal and       showed no anal or rectal abnormalities. Impression:           - Preparation of the colon was poor.                       - Stool in the entire examined colon.                       - Blood in the entire examined colon.                       - No specimens collected.                       - Black stool/melena noted. No red blood or active                         bleeding noted. Due to the large amount of stool, this                        was not an adequate examine to rule underlying lesions. Recommendation:       - Repeat colonoscopy tomorrow.                       - Continue Serial CBCs and transfuse PRN                       - Clear liquid diet.                       - The findings and recommendations were discussed with                        the patient.                       - The findings and recommendations were discussed with                        the patient's family.                       - I had an extensive discussion with pt and family that                        her exam today did not allow for ruling out of lesions                        such as AVMs, ulcers or even larger lesions in the  cecum due to the amount of stool present in the colon.                        The best next step would be to repeat the colonscopy                        tomorrow with more prep. We also discussed that if pt                        would like to eat solid food today, we may have to                        start the prep tomorrow and the procedure the day after                        that. They would like to attempt more prep today. A                        pill camera study was also discussed and is best done                        after a colonscopy rules out any lesions (unable to                        rule out large cecal lesions at this time due to poor                        prep). Procedure Code(s):    --- Professional ---                       [phone], Colonoscopy, flexible; diagnostic, including                        collection of specimen(s) by brushing or washing, when                        performed (separate procedure) Diagnosis Code(s):    --- Professional ---                       K92.2, Gastrointestinal hemorrhage, unspecified                       K92.1, Melena (includes Hematochezia) CPT copyright 2018  American Medical Association. All rights reserved. The codes documented in this report are preliminary and upon coder review may  be revised to meet current compliance requirements.  Vonda Antigua, MD Margretta Sidle B. Bonna Gains MD, MD 02/20/2019 12:52:20 PM This report has been signed electronically. Number of Addenda: 0 Note Initiated On: 02/20/2019 12:10 PM Scope Withdrawal Time: 0 hours 11 minutes 58 seconds  Total Procedure Duration: 0 hours 24 minutes 15 seconds  Estimated Blood Loss: Estimated blood loss: none.      [facility]

## 2019-02-20 NOTE — OR Nursing (Signed)
REPORT TO ERICA RN ON FLOOR. PT S/P COLONOSCOPY. POOR PREP. NEEDS REPEAT. PT CAN HAVE CLEAR LIQUID DIET WITH NPO AFTER MIDNIGHT AND COMPLETING COLON PREP AGAIN. PT ON 4LITERS O2  WITH O2 SAT 93%. RN I/S PRIMARY RN TO CONTACT INTERNIST FOR MAYBE F/U CHEST XRAY. PT WITH DYSPNEA AT REST. UNDERSTANDING VOICED

## 2019-02-21 ENCOUNTER — Encounter: Payer: Self-pay | Admitting: Gastroenterology

## 2019-02-21 ENCOUNTER — Inpatient Hospital Stay: Payer: Medicare Other | Admitting: Anesthesiology

## 2019-02-21 ENCOUNTER — Encounter
Admission: EM | Disposition: A | Discharge status: Home / Self Care | Payer: Self-pay | Source: Home / Self Care | Attending: Internal Medicine

## 2019-02-21 DIAGNOSIS — D120 Benign neoplasm of cecum: Secondary | ICD-10-CM

## 2019-02-21 DIAGNOSIS — K648 Other hemorrhoids: Secondary | ICD-10-CM

## 2019-02-21 DIAGNOSIS — D122 Benign neoplasm of ascending colon: Secondary | ICD-10-CM

## 2019-02-21 HISTORY — PX: COLONOSCOPY: SHX5424

## 2019-02-21 HISTORY — PX: ENTEROSCOPY: SHX5533

## 2019-02-21 LAB — CBC
HCT: 26.3 % — ABNORMAL LOW (ref 36.0–46.0)
HEMOGLOBIN: 7.8 g/dL — AB (ref 12.0–15.0)
MCH: 29.1 pg (ref 26.0–34.0)
MCHC: 29.7 g/dL — ABNORMAL LOW (ref 30.0–36.0)
MCV: 98.1 fL (ref 80.0–100.0)
Platelets: 240 10*3/uL (ref 150–400)
RBC: 2.68 MIL/uL — ABNORMAL LOW (ref 3.87–5.11)
RDW: 16.5 % — ABNORMAL HIGH (ref 11.5–15.5)
WBC: 8.2 10*3/uL (ref 4.0–10.5)
nRBC: 1 % — ABNORMAL HIGH (ref 0.0–0.2)

## 2019-02-21 LAB — BASIC METABOLIC PANEL
Anion gap: 8 (ref 5–15)
BUN: 54 mg/dL — ABNORMAL HIGH (ref 8–23)
CHLORIDE: 111 mmol/L (ref 98–111)
CO2: 20 mmol/L — ABNORMAL LOW (ref 22–32)
Calcium: 8.9 mg/dL (ref 8.9–10.3)
Creatinine, Ser: 2.25 mg/dL — ABNORMAL HIGH (ref 0.44–1.00)
GFR calc Af Amer: 25 mL/min — ABNORMAL LOW (ref >60)
GFR calc non Af Amer: 22 mL/min — ABNORMAL LOW (ref >60)
Glucose, Bld: 106 mg/dL — ABNORMAL HIGH (ref 70–99)
Potassium: 4.1 mmol/L (ref 3.5–5.1)
SODIUM: 139 mmol/L (ref 135–145)

## 2019-02-21 SURGERY — COLONOSCOPY
Anesthesia: General

## 2019-02-21 MED ORDER — FENTANYL CITRATE (PF) 100 MCG/2ML IJ SOLN
INTRAMUSCULAR | Status: DC | PRN
  Administered 2019-02-21 (×2): 50 ug via INTRAVENOUS

## 2019-02-21 MED ORDER — PROPOFOL 10 MG/ML IV BOLUS
INTRAVENOUS | Status: DC | PRN
  Administered 2019-02-21: 100 mg via INTRAVENOUS

## 2019-02-21 MED ORDER — LIDOCAINE 2% (20 MG/ML) 5 ML SYRINGE
INTRAMUSCULAR | Status: DC | PRN
  Administered 2019-02-21: 30 mg via INTRAVENOUS

## 2019-02-21 MED ORDER — SODIUM CHLORIDE 0.9 % IV SOLN
INTRAVENOUS | Status: DC
  Administered 2019-02-21: 1000 mL via INTRAVENOUS

## 2019-02-21 MED ORDER — PHENYLEPHRINE HCL 10 MG/ML IJ SOLN
INTRAMUSCULAR | Status: DC | PRN
  Administered 2019-02-21: 100 ug via INTRAVENOUS

## 2019-02-21 MED ORDER — FUROSEMIDE 20 MG PO TABS
20.0000 mg | ORAL_TABLET | Freq: Every day | ORAL | Status: DC
  Administered 2019-02-22: 20 mg via ORAL
  Filled 2019-02-21: qty 1

## 2019-02-21 MED ORDER — FUROSEMIDE 20 MG PO TABS
20.0000 mg | ORAL_TABLET | Freq: Once | ORAL | Status: AC
  Administered 2019-02-21: 20 mg via ORAL
  Filled 2019-02-21: qty 1

## 2019-02-21 MED ORDER — PROPOFOL 500 MG/50ML IV EMUL
INTRAVENOUS | Status: DC | PRN
  Administered 2019-02-21: 160 ug/kg/min via INTRAVENOUS

## 2019-02-21 MED ORDER — PROPOFOL 500 MG/50ML IV EMUL
INTRAVENOUS | Status: AC
  Filled 2019-02-21: qty 50

## 2019-02-21 MED ORDER — FENTANYL CITRATE (PF) 100 MCG/2ML IJ SOLN
INTRAMUSCULAR | Status: AC
  Filled 2019-02-21: qty 2

## 2019-02-21 NOTE — Plan of Care (Signed)

## 2019-02-21 NOTE — Transfer of Care (Signed)
Immediate Anesthesia Transfer of Care Note  Patient: Jeanette Romero  Procedure(s) Performed: COLONOSCOPY (N/A ) ENTEROSCOPY (N/A )  Patient Location: PACU and Endoscopy Unit  Anesthesia Type:General  Level of Consciousness: sedated  Airway & Oxygen Therapy: Patient Spontanous Breathing and Patient connected to nasal cannula oxygen  Post-op Assessment: Report given to RN and Post -op Vital signs reviewed and stable  Post vital signs: Reviewed and stable  Last Vitals:  Vitals Value Taken Time  BP 196/162 02/21/2019  2:54 PM  Temp    Pulse 78 02/21/2019  2:53 PM  Resp 11 02/21/2019  2:54 PM  SpO2 95 % 02/21/2019  2:53 PM  Vitals shown include unvalidated device data.  Last Pain:  Vitals:   02/21/19 1303  TempSrc: Tympanic  PainSc:          Complications: No apparent anesthesia complications

## 2019-02-21 NOTE — Progress Notes (Signed)
[facility] [place] NAME: Jeanette Romero    MR#:  [id number]  DATE OF BIRTH:  1951-07-13  SUBJECTIVE:  CHIEF COMPLAINT:   Chief Complaint  Patient presents with  . Shortness of Breath   - colonoscopy was incomplete yesterday due to poor prep - patient feels much better today -Colonoscopy today - breathing much better  REVIEW OF SYSTEMS:  Review of Systems  Constitutional: Positive for malaise/fatigue. Negative for chills and fever.  HENT: Negative for congestion, ear discharge, hearing loss and nosebleeds.   Eyes: Negative for blurred vision and double vision.  Respiratory: Negative for cough, shortness of breath and wheezing.   Cardiovascular: Negative for chest pain and palpitations.  Gastrointestinal: Positive for melena. Negative for abdominal pain, constipation, diarrhea, nausea and vomiting.  Genitourinary: Negative for dysuria.  Musculoskeletal: Negative for myalgias.  Neurological: Negative for dizziness, focal weakness, seizures, weakness and headaches.  Psychiatric/Behavioral: Negative for depression.    DRUG ALLERGIES:   Allergies  Allergen Reactions  . Atorvastatin     Other reaction(s): Liver Disorder Elevated liver enzymes    VITALS:  Blood pressure 115/85, pulse 69, temperature 98.2 F (36.8 C), temperature source Oral, resp. rate 17, height 5\' 2"  (1.575 m), weight 77 kg, SpO2 94 %.  PHYSICAL EXAMINATION:  Physical Exam   GENERAL:  68 y.o.-year-old patient lying in the bed with no acute distress.  EYES: Pupils equal, round, reactive to light and accommodation. No scleral icterus. Extraocular muscles intact.  HEENT: Head atraumatic, normocephalic. Oropharynx and nasopharynx clear.  NECK:  Supple, no jugular venous distention. No thyroid enlargement, no tenderness.  LUNGS: Normal breath sounds bilaterally, no wheezing, rales,rhonchi or crepitation. No use of accessory muscles of respiration.  Decreased  bibasilar breath sounds CARDIOVASCULAR: S1, S2 normal. No  rubs, or gallops.  Loud 3/6 systolic murmur in the aortic area ABDOMEN: Soft, nontender, nondistended. Bowel sounds present. No organomegaly or mass.  EXTREMITIES: No pedal edema, cyanosis, or clubbing.  NEUROLOGIC: Cranial nerves II through XII are intact. Muscle strength 5/5 in all extremities. Sensation intact. Gait not checked.  PSYCHIATRIC: The patient is alert and oriented x 3.  SKIN: No obvious rash, lesion, or ulcer.    LABORATORY PANEL:   CBC Recent Labs  Lab 02/21/19 0454  WBC 8.2  HGB 7.8*  HCT 26.3*  PLT 240   ------------------------------------------------------------------------------------------------------------------  Chemistries  Recent Labs  Lab 02/21/19 0454  NA 139  K 4.1  CL 111  CO2 20*  GLUCOSE 106*  BUN 54*  CREATININE 2.25*  CALCIUM 8.9   ------------------------------------------------------------------------------------------------------------------  Cardiac Enzymes Recent Labs  Lab 02/18/19 0550  TROPONINI 1.47*   ------------------------------------------------------------------------------------------------------------------  RADIOLOGY:  Dg Chest 2 View  Result Date: 02/20/2019 CLINICAL DATA:  Dyspnea status post colonoscopy. EXAM: CHEST - 2 VIEW COMPARISON:  Radiographs of February 17, 2019. FINDINGS: Stable cardiomegaly. Sternotomy wires are noted. No pneumothorax is noted. Right lung is clear. Interval development of moderate left pleural effusion with associated left basilar atelectasis or infiltrate. Bony thorax is unremarkable. IMPRESSION: Interval development of moderate left pleural effusion with associated left basilar atelectasis or infiltrate. Electronically Signed   By: Marijo Conception, M.D.   On: 02/20/2019 16:04    EKG:   Orders placed or performed during the hospital encounter of 02/17/19  . ED EKG  . ED EKG  . EKG 12-Lead  . 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  Acute on chronic anemia-has been having melanotic stools.  - No prior EGD or colonoscopy -INR is 2, Coumadin is on hold.    No vitamin K or FFP. -Received 2 units packed RBC transfusion.  Hemoglobin is stable  -Appreciate GI consult.   -Status post EGD  showing minimal gastritis and possible Barrett's esophagus-biopsies not done due to elevated INR and recent bleed.  Will need outpatient follow-up and repeat EGD for biopsies.  No active source of bleeding noted. -Patient going for colonoscopy again today.  Was incomplete yesterday due to poor preparation -Change Protonix to p.o.  2.  Elevated troponin-likely demand ischemia.  Troponins have plateaued. -Cardiac catheterization prior to her aortic valve replacement in 2016 showing mild to moderate coronary disease -No cardiac intervention at this time. -Follow cardiology recommendations.  3.  Paroxysmal atrial fibrillation-failed cardioversion twice.  Rate fluctuating.  On low-dose metoprolol -.  Hold off on anticoagulation at this time.  Restart at discharge after the GI procedures -No active bleeding noted.  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  Hyperthyroidism-continue methimazole.  Normal TSH and T4 levels  5. CKD stage IV-nephrology has been consulted. hold nephrotoxins. -Continue outpatient follow-up.  Renal function is stable  6.  DVT prophylaxis-teds and SCDs only  7.  Hypertension-monitor, home meds restarted.  On Norvasc, clonidine and metoprolol  Ambulatory at baseline Continue to wean off oxygen and encourage ambulation   All the records are reviewed and  case discussed with Care Management/Social Workerr. Management plans discussed with the patient, family and they are in agreement.  CODE STATUS: Full code  TOTAL TIME TAKING CARE OF THIS PATIENT: 39 minutes.   POSSIBLE D/C tomorrow, DEPENDING ON CLINICAL CONDITION.   Gladstone Lighter M.D on 02/21/2019 at 12:25 PM  Between 7am to 6pm - Pager - [phone]  After 6pm go to [email or web address] - password [email or web address] [facility]  Office  [phone]  CC: Primary care physician; [place] Pink, MD

## 2019-02-21 NOTE — Op Note (Signed)
[facility] Gastroenterology Patient Name: Jeanette Romero Procedure Date: 02/21/2019 2:42 PM MRN: [id number] Account #: [id number] Date of Birth: [birth year] Admit Type: Outpatient Age: 68 Room: [facility] ENDO ROOM 1 Gender: Female Note Status: Finalized Procedure:            Small bowel enteroscopy Indications:          Acute post hemorrhagic anemia, Melena Providers:            Varnita B. Bonna Gains MD, MD Referring MD:         Forest Gleason Md, MD (Referring MD) Medicines:            Monitored Anesthesia Care Complications:        No immediate complications. Procedure:            Pre-Anesthesia Assessment:                       - ASA Grade Assessment: III - A patient with severe                        systemic disease.                       - Prior to the procedure, a History and Physical was                        performed, and patient medications, allergies and                        sensitivities were reviewed. The patient's tolerance of                        previous anesthesia was reviewed.                       - The risks and benefits of the procedure and the                        sedation options and risks were discussed with the                        patient. All questions were answered and informed                        consent was obtained.                       - Patient identification and proposed procedure were                        verified prior to the procedure by the physician, the                        nurse, the anesthesiologist, the anesthetist and the                        technician. The procedure was verified in the procedure                        room.  After obtaining informed consent, the endoscope was                        passed under direct vision. Throughout the procedure,                        the patient's blood pressure, pulse, and oxygen                        saturations were monitored continuously. The                         Colonoscope was introduced through the mouth and                        advanced to the jejunum, to the 160 cm mark (from the                        incisors). The small bowel enteroscopy was accomplished                        with ease. The patient tolerated the procedure well. Findings:      There were esophageal mucosal changes suspicious for short-segment       Barrett's esophagus present in the distal esophagus. The maximum       longitudinal extent of these mucosal changes was 1 cm in length.      The entire examined stomach was normal.      There was no evidence of significant pathology in the entire examined       duodenum.      There was no evidence of significant pathology in the entire examined       portion of jejunum. Impression:           - Esophageal mucosal changes suspicious for                        short-segment Barrett's esophagus.                       - Normal stomach.                       - Normal examined duodenum.                       - The examined portion of the jejunum was normal.                       - No specimens collected. Recommendation:       - The source of patient's GI bleed has not been found                        on her EGD, Push enteroscopy and colonoscopy on this                        admission                       - GI bleed has clinically resolved as evidenced by  yellow stool in the colon today and no melena. Pt                        denies any further melena or hematochezia.                       - Small bowel capsule is not available currently at                        [facility] due to equipment error. If pt has                        ongoing anemia or any evidence of recurrent bleeding,                        would recommend transfer to [facility] or another                        facility for inpatient small bowel capsule study. RBC                        scan or CTA can also be  considered instead if active                        bleeding occurs.                       - Discuss need for resuming Coumadin with Cardiology                        and Coumadin is resumed, patient's hemoglobin should be                        monitored closely (atleast once a week as an                        outpatient). If Coumadin is resumed would recommend                        monitoring for atleast 1-2 days as an inpatient prior                        to discharge.                       - Outpatient small bowel capsule study to be scheduled                        at outlying facility (timeline of availability of this                        at [facility] is unclear). This can be set up at the time of                        clinic follow up.                       - Continue Serial CBCs and transfuse PRN                       -  Advance diet as tolerated.                       - The findings and recommendations were discussed with                        the patient.                       - The findings and recommendations were discussed with                        the patient's family.                       - Return to my office in 1 week. Procedure Code(s):    --- Professional ---                       [phone], Small intestinal endoscopy, enteroscopy beyond                        second portion of duodenum, not including ileum;                        diagnostic, including collection of specimen(s) by                        brushing or washing, when performed (separate procedure) Diagnosis Code(s):    --- Professional ---                       K22.8, Other specified diseases of esophagus                       D62, Acute posthemorrhagic anemia                       K92.1, Melena (includes Hematochezia) CPT copyright 2018 American Medical Association. All rights reserved. The codes documented in this report are preliminary and upon coder review may  be revised to meet current compliance  requirements.  Vonda Antigua, MD Margretta Sidle B. Bonna Gains MD, MD 02/21/2019 2:59:28 PM This report has been signed electronically. Number of Addenda: 0 Note Initiated On: 02/21/2019 2:42 PM      [facility]

## 2019-02-21 NOTE — Anesthesia Post-op Follow-up Note (Signed)
Anesthesia QCDR form completed.        

## 2019-02-21 NOTE — Op Note (Addendum)
[facility] Gastroenterology Patient Name: Jeanette Romero Procedure Date: 02/21/2019 1:16 PM MRN: [id number] Account #: [id number] Date of Birth: 07-Dec-1951 Admit Type: Outpatient Age: 68 Room: [facility] ENDO ROOM 1 Gender: Female Note Status: Finalized Procedure:            Colonoscopy Indications:          Melena, Iron deficiency anemia Providers:            Kelley Knoth B. Bonna Gains MD, MD Referring MD:         Irven Easterly. Kary Kos, MD (Referring MD) Medicines:            Monitored Anesthesia Care Complications:        No immediate complications. Procedure:            Pre-Anesthesia Assessment:                       - ASA Grade Assessment: III - A patient with severe                        systemic disease.                       - Prior to the procedure, a History and Physical was                        performed, and patient medications, allergies and                        sensitivities were reviewed. The patient's tolerance of                        previous anesthesia was reviewed.                       - The risks and benefits of the procedure and the                        sedation options and risks were discussed with the                        patient. All questions were answered and informed                        consent was obtained.                       - Patient identification and proposed procedure were                        verified prior to the procedure by the physician, the                        nurse, the anesthesiologist, the anesthetist and the                        technician. The procedure was verified in the procedure                        room.  After obtaining informed consent, the colonoscope was                        passed under direct vision. Throughout the procedure,                        the patient's blood pressure, pulse, and oxygen                        saturations were monitored continuously. The               Colonoscope was introduced through the anus and                        advanced to the the terminal ileum. The colonoscopy was                        performed with ease. The patient tolerated the                        procedure well. The quality of the bowel preparation                        was fair. Findings:      The perianal and digital rectal examinations were normal.      3-4 sessile, non-bleeding polyps were found in the ascending colon and       cecum. The polyps were 3 to 4 mm in size. They were not removed due to       recent GI bleed on this admission.      The exam was otherwise without abnormality.      Non-bleeding internal hemorrhoids were found during retroflexion. Impression:           - Preparation of the colon was fair.                       - 3-4 3 to 4 mm, non-bleeding polyps in the ascending                        colon and in the cecum.                       - The examination was otherwise normal.                       - Non-bleeding internal hemorrhoids.                       - No specimens collected.                       - No evidence of active or recent bleeding seen                        throughout the exam. Recommendation:       - Advance diet as tolerated.                       - Continue present medications.                       - Await pathology  results.                       - Repeat colonoscopy within 6 months to remove polyps.                       - The findings and recommendations were discussed with                        the patient.                       - The findings and recommendations were discussed with                        the patient's family.                       - Return to primary care physician as previously                        scheduled.                       - Return to my office in 2 weeks. Procedure Code(s):    --- Professional ---                       [phone], Colonoscopy, flexible; diagnostic, including                         collection of specimen(s) by brushing or washing, when                        performed (separate procedure) Diagnosis Code(s):    --- Professional ---                       K64.8, Other hemorrhoids                       D12.2, Benign neoplasm of ascending colon                       D12.0, Benign neoplasm of cecum                       K92.1, Melena (includes Hematochezia)                       D50.9, Iron deficiency anemia, unspecified CPT copyright 2018 American Medical Association. All rights reserved. The codes documented in this report are preliminary and upon coder review may  be revised to meet current compliance requirements.  Vonda Antigua, MD Margretta Sidle B. Bonna Gains MD, MD 02/21/2019 2:33:52 PM This report has been signed electronically. Number of Addenda: 0 Note Initiated On: 02/21/2019 1:16 PM Scope Withdrawal Time: 0 hours 10 minutes 4 seconds  Total Procedure Duration: 0 hours 14 minutes 48 seconds  Estimated Blood Loss: Estimated blood loss: none.      [facility]

## 2019-02-21 NOTE — Anesthesia Preprocedure Evaluation (Addendum)
Anesthesia Evaluation  Patient identified by MRN, date of birth, ID band Patient awake    Reviewed: Allergy & Precautions, NPO status , Patient's Chart, lab work & pertinent test results  History of Anesthesia Complications Negative for: history of anesthetic complications  Airway Mallampati: III  TM Distance: >3 FB     Dental no notable dental hx. (+) Poor Dentition   Pulmonary neg sleep apnea, neg COPD, former smoker,     + decreased breath sounds      Cardiovascular hypertension, (-) angina+ CAD  (-) Past MI, (-) Cardiac Stents and (-) CABG + dysrhythmias Atrial Fibrillation  Rhythm:Regular Rate:Normal - Systolic murmurs and - Diastolic murmurs    Neuro/Psych neg Seizures PSYCHIATRIC DISORDERS Anxiety Depression negative neurological ROS     GI/Hepatic Neg liver ROS, GERD  ,  Endo/Other  neg diabetesHyperthyroidism   Renal/GU Renal Insufficiency and CRFRenal disease     Musculoskeletal negative musculoskeletal ROS (+)   Abdominal (+) + obese,   Peds  Hematology  (+) Blood dyscrasia, anemia ,   Anesthesia Other Findings Past Medical History: 11/14/2017: Anemia in chronic kidney disease (CKD) No date: Anxiety No date: Aortic valve regurgitation 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 No date: Chronic kidney disease No date: Coronary artery disease No date: Dizziness No date: Edema No date: GERD (gastroesophageal reflux disease) No date: Gout No date: Hypercholesterolemia No date: Hypertension No date: Shortness of breath dyspnea   Reproductive/Obstetrics                           Anesthesia Physical  Anesthesia Plan  ASA: III  Anesthesia Plan: General   Post-op Pain Management:    Induction: Intravenous  PONV Risk Score and Plan: Propofol infusion  Airway Management Planned: Simple Face Mask  Additional Equipment:   Intra-op Plan:   Post-operative Plan:   Informed Consent:  I have reviewed the patients History and Physical, chart, labs and discussed the procedure including the risks, benefits and alternatives for the proposed anesthesia with the patient or authorized representative who has indicated his/her understanding and acceptance.     Dental advisory given  Plan Discussed with: CRNA and Anesthesiologist  Anesthesia Plan Comments:        Anesthesia Quick Evaluation

## 2019-02-21 NOTE — Anesthesia Postprocedure Evaluation (Signed)
Anesthesia Post Note  Patient: Jeanette Romero  Procedure(s) Performed: COLONOSCOPY (N/A ) ENTEROSCOPY (N/A )  Patient location during evaluation: Endoscopy Anesthesia Type: General Level of consciousness: awake and alert and oriented Pain management: pain level controlled Vital Signs Assessment: post-procedure vital signs reviewed and stable Respiratory status: spontaneous breathing, nonlabored ventilation and respiratory function stable Cardiovascular status: blood pressure returned to baseline and stable Postop Assessment: no signs of nausea or vomiting Anesthetic complications: no     Last Vitals:  Vitals:   02/21/19 1452 02/21/19 1455  BP: (!) 95/50 (!) 196/162  Pulse: 79 78  Resp: 12 11  Temp: (!) 36.1 C (!) 36.1 C  SpO2: 99% 95%    Last Pain:  Vitals:   02/21/19 1452  TempSrc: Tympanic  PainSc: Asleep                 Shawneen Deetz

## 2019-02-21 NOTE — Anesthesia Postprocedure Evaluation (Signed)
Anesthesia Post Note  Patient: Jeanette Romero  Procedure(s) Performed: COLONOSCOPY (Left )  Patient location during evaluation: PACU Anesthesia Type: General Level of consciousness: awake and alert Pain management: pain level controlled Vital Signs Assessment: post-procedure vital signs reviewed and stable Respiratory status: spontaneous breathing, nonlabored ventilation and respiratory function stable Cardiovascular status: blood pressure returned to baseline and stable Postop Assessment: no apparent nausea or vomiting Anesthetic complications: no     Last Vitals:  Vitals:   02/21/19 0757 02/21/19 1207  BP: (!) 132/96 115/85  Pulse: 71 69  Resp: 17   Temp: 36.8 C   SpO2: 94%     Last Pain:  Vitals:   02/21/19 0757  TempSrc: Oral  PainSc: 0-No pain                 Durenda Hurt

## 2019-02-22 ENCOUNTER — Encounter: Payer: Self-pay | Admitting: Gastroenterology

## 2019-02-22 LAB — CBC
HCT: 26.8 % — ABNORMAL LOW (ref 36.0–46.0)
Hemoglobin: 7.9 g/dL — ABNORMAL LOW (ref 12.0–15.0)
MCH: 29.3 pg (ref 26.0–34.0)
MCHC: 29.5 g/dL — ABNORMAL LOW (ref 30.0–36.0)
MCV: 99.3 fL (ref 80.0–100.0)
NRBC: 0.5 % — AB (ref 0.0–0.2)
PLATELETS: 212 10*3/uL (ref 150–400)
RBC: 2.7 MIL/uL — ABNORMAL LOW (ref 3.87–5.11)
RDW: 16.7 % — ABNORMAL HIGH (ref 11.5–15.5)
WBC: 6.6 10*3/uL (ref 4.0–10.5)

## 2019-02-22 LAB — PROTIME-INR
INR: 1.3 — ABNORMAL HIGH (ref 0.8–1.2)
PROTHROMBIN TIME: 15.6 s — AB (ref 11.4–15.2)

## 2019-02-22 MED ORDER — PANTOPRAZOLE SODIUM 20 MG PO TBEC: 20.0000 mg | DELAYED_RELEASE_TABLET | Freq: Every day | ORAL | 2 refills | Status: AC

## 2019-02-22 MED ORDER — METOPROLOL TARTRATE 25 MG PO TABS: 25.0000 mg | ORAL_TABLET | Freq: Two times a day (BID) | ORAL | 2 refills | Status: DC

## 2019-02-22 NOTE — Discharge Instructions (Signed)
Anemia  Anemia is a condition in which you do not have enough red blood cells or hemoglobin. Hemoglobin is a substance in red blood cells that carries oxygen. When you do not have enough red blood cells or hemoglobin (are anemic), your body cannot get enough oxygen and your organs may not work properly. As a result, you may feel very tired or have other problems. What are the causes? Common causes of anemia include:  Excessive bleeding. Anemia can be caused by excessive bleeding inside or outside the body, including bleeding from the intestine or from periods in women.  Poor nutrition.  Long-lasting (chronic) kidney, thyroid, and liver disease.  Bone marrow disorders.  Cancer and treatments for cancer.  HIV (human immunodeficiency virus) and AIDS (acquired immunodeficiency syndrome).  Treatments for HIV and AIDS.  Spleen problems.  Blood disorders.  Infections, medicines, and autoimmune disorders that destroy red blood cells. What are the signs or symptoms? Symptoms of this condition include:  Minor weakness.  Dizziness.  Headache.  Feeling heartbeats that are irregular or faster than normal (palpitations).  Shortness of breath, especially with exercise.  Paleness.  Cold sensitivity.  Indigestion.  Nausea.  Difficulty sleeping.  Difficulty concentrating. Symptoms may occur suddenly or develop slowly. If your anemia is mild, you may not have symptoms. How is this diagnosed? This condition is diagnosed based on:  Blood tests.  Your medical history.  A physical exam.  Bone marrow biopsy. Your health care provider may also check your stool (feces) for blood and may do additional testing to look for the cause of your bleeding. You may also have other tests, including:  Imaging tests, such as a CT scan or MRI.  Endoscopy.  Colonoscopy. How is this treated? Treatment for this condition depends on the cause. If you continue to lose a lot of blood, you may  need to be treated at a hospital. Treatment may include:  Taking supplements of iron, vitamin M08, or folic acid.  Taking a hormone medicine (erythropoietin) that can help to stimulate red blood cell growth.  Having a blood transfusion. This may be needed if you lose a lot of blood.  Making changes to your diet.  Having surgery to remove your spleen. Follow these instructions at home:  Take over-the-counter and prescription medicines only as told by your health care provider.  Take supplements only as told by your health care provider.  Follow any diet instructions that you were given.  Keep all follow-up visits as told by your health care provider. This is important. Contact a health care provider if:  You develop new bleeding anywhere in the body. Get help right away if:  You are very weak.  You are short of breath.  You have pain in your abdomen or chest.  You are dizzy or feel faint.  You have trouble concentrating.  You have bloody or black, tarry stools.  You vomit repeatedly or you vomit up blood. Summary  Anemia is a condition in which you do not have enough red blood cells or enough of a substance in your red blood cells that carries oxygen (hemoglobin).  Symptoms may occur suddenly or develop slowly.  If your anemia is mild, you may not have symptoms.  This condition is diagnosed with blood tests as well as a medical history and physical exam. Other tests may be needed.  Treatment for this condition depends on the cause of the anemia. This information is not intended to replace advice given to you by  your health care provider. Make sure you discuss any questions you have with your health care provider. °Document Released: 01/05/2005 Document Revised: 12/30/2016 Document Reviewed: 12/30/2016 °Elsevier Interactive Patient Education © 2019 [employer] ° ° ° °Blood Transfusion, Adult, Care After °This sheet gives you information about how to care for yourself  after your procedure. Your doctor may also give you more specific instructions. If you have problems or questions, contact your doctor. °Follow these instructions at home: ° °· Take over-the-counter and prescription medicines only as told by your doctor. °· Go back to your normal activities as told by your doctor. °· Follow instructions from your doctor about how to take care of the area where an IV tube was put into your vein (insertion site). Make sure you: °? Wash your hands with soap and water before you change your bandage (dressing). If there is no soap and water, use hand sanitizer. °? Change your bandage as told by your doctor. °· Check your IV insertion site every day for signs of infection. Check for: °? More redness, swelling, or pain. °? More fluid or blood. °? Warmth. °? Pus or a bad smell. °Contact a doctor if: °· You have more redness, swelling, or pain around the IV insertion site. °· You have more fluid or blood coming from the IV insertion site. °· Your IV insertion site feels warm to the touch. °· You have pus or a bad smell coming from the IV insertion site. °· Your pee (urine) turns pink, red, or brown. °· You feel weak after doing your normal activities. °Get help right away if: °· You have signs of a serious allergic or body defense (immune) system reaction, including: °? Itchiness. °? Hives. °? Trouble breathing. °? Anxiety. °? Pain in your chest or lower back. °? Fever, flushing, and chills. °? Fast pulse. °? Rash. °? Watery poop (diarrhea). °? Throwing up (vomiting). °? Dark pee. °? Serious headache. °? Dizziness. °? Stiff neck. °? Yellow color in your face or the white parts of your eyes (jaundice). °Summary °· After a blood transfusion, return to your normal activities as told by your doctor. °· Every day, check for signs of infection where the IV tube was put into your vein. °· Some signs of infection are warm skin, more redness and pain, more fluid or blood, and pus or a bad smell where  the needle went in. °· Contact your doctor if you feel weak or have any unusual symptoms. °This information is not intended to replace advice given to you by your health care provider. Make sure you discuss any questions you have with your health care provider. °Document Released: 12/19/2014 Document Revised: 07/22/2016 Document Reviewed: 07/22/2016 °Elsevier Interactive Patient Education © 2019 [employer] ° °

## 2019-02-22 NOTE — Plan of Care (Signed)
Pt ready for discharge.   Problem: Activity: Goal: Will identify at least one activity in which they can participate Outcome: Completed/Met   Problem: Coping: Goal: Ability to identify and develop effective coping behavior will improve Outcome: Completed/Met Goal: Ability to interact with others will improve Outcome: Completed/Met Goal: Demonstration of participation in decision-making regarding own care will improve Outcome: Completed/Met Goal: Ability to use eye contact when communicating with others will improve Outcome: Completed/Met   Problem: Health Behavior/Discharge Planning: Goal: Identification of resources available to assist in meeting health care needs will improve Outcome: Completed/Met   Problem: Self-Concept: Goal: Will verbalize positive feelings about self Outcome: Completed/Met   Problem: Education: Goal: Ability to identify signs and symptoms of gastrointestinal bleeding will improve Outcome: Completed/Met   Problem: Bowel/Gastric: Goal: Will show no signs and symptoms of gastrointestinal bleeding Outcome: Completed/Met   Problem: Fluid Volume: Goal: Will show no signs and symptoms of excessive bleeding Outcome: Completed/Met   Problem: Clinical Measurements: Goal: Complications related to the disease process, condition or treatment will be avoided or minimized Outcome: Completed/Met   Problem: Education: Goal: Knowledge of General Education information will improve Description Including pain rating scale, medication(s)/side effects and non-pharmacologic comfort measures Outcome: Completed/Met   Problem: Health Behavior/Discharge Planning: Goal: Ability to manage health-related needs will improve Outcome: Completed/Met   Problem: Clinical Measurements: Goal: Ability to maintain clinical measurements within normal limits will improve Outcome: Completed/Met Goal: Will remain free from infection Outcome: Completed/Met Goal: Diagnostic test  results will improve Outcome: Completed/Met Goal: Respiratory complications will improve Outcome: Completed/Met Goal: Cardiovascular complication will be avoided Outcome: Completed/Met   Problem: Activity: Goal: Risk for activity intolerance will decrease Outcome: Completed/Met   Problem: Nutrition: Goal: Adequate nutrition will be maintained Outcome: Completed/Met   Problem: Coping: Goal: Level of anxiety will decrease Outcome: Completed/Met   Problem: Elimination: Goal: Will not experience complications related to bowel motility Outcome: Completed/Met Goal: Will not experience complications related to urinary retention Outcome: Completed/Met   Problem: Pain Managment: Goal: General experience of comfort will improve Outcome: Completed/Met   Problem: Safety: Goal: Ability to remain free from injury will improve Outcome: Completed/Met   Problem: Skin Integrity: Goal: Risk for impaired skin integrity will decrease Outcome: Completed/Met

## 2019-02-22 NOTE — Discharge Summary (Addendum)
[facility] [place] NAME: Jeanette Romero    MR#:  [id number]  DATE OF BIRTH:  03-Nov-1951  DATE OF ADMISSION:  02/17/2019   ADMITTING PHYSICIAN: Demetrios Loll, MD  DATE OF DISCHARGE: 02/22/2019  1:10 PM  PRIMARY CARE PHYSICIAN: Maryland Pink, MD   ADMISSION DIAGNOSIS:   Acute blood loss anemia [D62] Elevated troponin [R79.89] Acute upper GI bleed [K92.2] Demand ischemia of myocardium (HCC) [I24.8]  DISCHARGE DIAGNOSIS:   Active Problems:   Anemia of chronic renal failure   Symptomatic anemia   Acute posthemorrhagic anemia   Demand ischemia of myocardium (HCC)   Columnar-lined esophagus   Melena   Elevated troponin   Benign neoplasm of ascending colon   Benign neoplasm of cecum   Internal hemorrhoids   SECONDARY DIAGNOSIS:   Past Medical History:  Diagnosis Date  . Anemia in chronic kidney disease (CKD) 11/14/2017  . Anxiety   . Aortic valve regurgitation   . Aortic valve replaced   . Chronic kidney disease   . Coronary artery disease   . Dizziness   . Edema   . GERD (gastroesophageal reflux disease)   . Gout   . Hypercholesterolemia   . Hypertension   . Shortness of breath dyspnea     HOSPITAL COURSE:   68 year old female with past medical history significant for chronic anemia, CKD stage IV, hypertension, atrial fibrillation on Coumadin, history of mechanical aortic valve on anticoagulation, hypertension presents to hospital secondary to weakness and noted to have anemia   1.  Acute on chronic anemia-has been having melanotic stools.  - No prior EGD or colonoscopy -INR is 2, Coumadin was held in hospital.    No vitamin K or FFP. -Received 2 units packed RBC transfusion this adm.  Hemoglobin is stable  And >7.5 -Appreciate GI consult.   -Status post EGD  showing minimal gastritis and possible Barrett's esophagus-biopsies not done due to elevated INR and recent bleed.  Will need outpatient follow-up and repeat  EGD for biopsies.  No active source of bleeding noted. -Patient is discharged on PPI -Status post colonoscopy showing 3 to 4 mm sized sessile polyps again biopsies were not done due to recent bleed.  Patient had melena and old blood during colonoscopy procedure.  It was actually repeated twice due to poor prep on the first day.  No active bleeding noted.  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 -Since no active bleeding, she will follow-up with GI as outpatient for capsule endoscopy.. -She will be restarted on her Coumadin at discharge.  Continue to monitor hemoglobin weekly.  2.  Elevated troponin-likely demand ischemia.  Troponins have plateaued. -Cardiac catheterization prior to her aortic valve replacement in 2016 showing mild to moderate coronary disease -No cardiac intervention at this time. -[facility] cardiology consultation.  3.  Paroxysmal atrial fibrillation-failed cardioversion twice as outpatient. -Since rate was fluctuating, she is started on low-dose metoprolol with good rate control. -Coumadin was held in the hospital, restarted at discharge. -No active bleeding noted.  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  Hyperthyroidism-continue methimazole.  Normal TSH and T4 levels  5. CKD stage IV-nephrology has been consulted.   -Continue outpatient follow-up.  Renal function is stable  6.  Hypertension-patient on Norvasc, benazepril, clonidine and metoprolol.  Also on furosemide from home.  Off oxygen.  Ambulatory at baseline.  Will be  discharged home today.  DISCHARGE CONDITIONS:   Guarded  CONSULTS OBTAINED:   Treatment Team:  Virgel Manifold, MD Anthonette Legato, MD Lloyd Huger, MD Teodoro Spray, MD  DRUG ALLERGIES:   Allergies  Allergen Reactions  . Atorvastatin     Other reaction(s): Liver Disorder Elevated liver  enzymes   DISCHARGE MEDICATIONS:   Allergies as of 02/22/2019      Reactions   Atorvastatin    Other reaction(s): Liver Disorder Elevated liver enzymes      Medication List    STOP taking these medications   hydrALAZINE 100 MG tablet Commonly known as:  APRESOLINE     TAKE these medications   ALPRAZolam 0.25 MG tablet Commonly known as:  XANAX Take 0.25 mg by mouth 2 (two) times daily.   amLODipine 10 MG tablet Commonly known as:  NORVASC Take 5 mg by mouth daily.   amoxicillin 500 MG tablet Commonly known as:  AMOXIL Take 2,000 mg by mouth as needed.   aspirin EC 81 MG tablet Take 81 mg by mouth daily.   benazepril 40 MG tablet Commonly known as:  LOTENSIN Take 40 mg by mouth daily.   cloNIDine 0.3 MG tablet Commonly known as:  CATAPRES Take 0.3 mg 3 (three) times daily by mouth.   folic acid 1 MG tablet Commonly known as:  FOLVITE TAKE 1 TABLET(1 MG) BY MOUTH DAILY   furosemide 20 MG tablet Commonly known as:  LASIX Take 20-40 mg by mouth daily. Take one tablet on odd days and two tablets on even days   methimazole 5 MG tablet Commonly known as:  TAPAZOLE Take 5 mg by mouth daily.   metoprolol tartrate 25 MG tablet Commonly known as:  LOPRESSOR Take 1 tablet (25 mg total) by mouth 2 (two) times daily.   pantoprazole 20 MG tablet Commonly known as:  PROTONIX Take 1 tablet (20 mg total) by mouth daily. Start taking on:  February 23, 2019   potassium chloride 10 MEQ tablet Commonly known as:  K-DUR Take 10 mEq by mouth daily.   rosuvastatin 5 MG tablet Commonly known as:  CRESTOR Take 5 mg by mouth at bedtime.   SODIUM BICARBONATE (ANTACID) PO Take 10 mg by mouth.   warfarin 2 MG tablet Commonly known as:  COUMADIN Take 4 mg by mouth daily.   warfarin 4 MG tablet Commonly known as:  COUMADIN Take 4 mg by mouth daily.        DISCHARGE INSTRUCTIONS:   1. PCP f/u in 1-2 weeks 2. GI f/u for capsule endoscopy in 2 weeks 3. Cardiology f/u  as prior scheduled 4. INR check in 3-4 days and hemoglobin check in 4 days  DIET:   Cardiac diet  ACTIVITY:   Activity as tolerated  OXYGEN:   Home Oxygen: No.  Oxygen Delivery: room air  DISCHARGE LOCATION:   home   If you experience worsening of your admission symptoms, develop shortness of breath, life threatening emergency, suicidal or homicidal thoughts you must seek medical attention immediately by calling 911 or calling your MD immediately  if symptoms less severe.  You Must read complete instructions/literature along with all the possible adverse reactions/side effects for all the Medicines you take and that have been prescribed to you. Take any new Medicines after you have completely understood and accpet all the possible adverse reactions/side effects.   Please note  You were cared for by a hospitalist during your hospital stay. If you have any questions  about your discharge medications or the care you received while you were in the hospital after you are discharged, you can call the unit and asked to speak with the hospitalist on call if the hospitalist that took care of you is not available. Once you are discharged, your primary care physician will handle any further medical issues. Please note that NO REFILLS for any discharge medications will be authorized once you are discharged, as it is imperative that you return to your primary care physician (or establish a relationship with a primary care physician if you do not have one) for your aftercare needs so that they can reassess your need for medications and monitor your lab values.    On the day of Discharge:  VITAL SIGNS:   Blood pressure (!) 156/81, pulse 95, temperature 98.5 F (36.9 C), temperature source Oral, resp. rate 20, height 5\' 2"  (1.575 m), weight 76.6 kg, SpO2 99 %.  PHYSICAL EXAMINATION:    GENERAL:  68 y.o.-year-old patient lying in the bed with no acute distress.  EYES: Pupils equal, round,  reactive to light and accommodation. No scleral icterus. Extraocular muscles intact.  HEENT: Head atraumatic, normocephalic. Oropharynx and nasopharynx clear.  NECK:  Supple, no jugular venous distention. No thyroid enlargement, no tenderness.  LUNGS: Normal breath sounds bilaterally, no wheezing, rales,rhonchi or crepitation. No use of accessory muscles of respiration.  Decreased bibasilar breath sounds CARDIOVASCULAR: S1, S2 normal. No  rubs, or gallops.  Loud 3/6 systolic murmur in the aortic area ABDOMEN: Soft, nontender, nondistended. Bowel sounds present. No organomegaly or mass.  EXTREMITIES: No pedal edema, cyanosis, or clubbing.  NEUROLOGIC: Cranial nerves II through XII are intact. Muscle strength 5/5 in all extremities. Sensation intact. Gait not checked.  PSYCHIATRIC: The patient is alert and oriented x 3.  SKIN: No obvious rash, lesion, or ulcer   DATA REVIEW:   CBC Recent Labs  Lab 02/22/19 0446  WBC 6.6  HGB 7.9*  HCT 26.8*  PLT 212    Chemistries  Recent Labs  Lab 02/21/19 0454  NA 139  K 4.1  CL 111  CO2 20*  GLUCOSE 106*  BUN 54*  CREATININE 2.25*  CALCIUM 8.9     Microbiology Results  No results found for this or any previous visit.  RADIOLOGY:  No results found.   Management plans discussed with the patient, family and they are in agreement.  CODE STATUS:     Code Status Orders  (From admission, onward)         Start     Ordered   02/18/19 0002  Full code  Continuous     02/18/19 0001        Code Status History    Date Active Date Inactive Code Status Order ID Comments User Context   05/07/2015 0834 05/08/2015 1402 Full Code [id number]  [name], MD Inpatient   05/05/2015 1340 05/07/2015 0834 Full Code [id number]  Dustin Flock, MD Inpatient      TOTAL TIME TAKING CARE OF THIS PATIENT: 38 minutes.    Gladstone Lighter M.D on 02/22/2019 at 2:17 PM  Between 7am to 6pm - Pager - [phone]  After 6pm go to [email or web address] -  Proofreader  [facility]  Office  [phone]  CC: Primary care physician; Maryland Pink, MD   Note: This dictation was prepared with Dragon dictation along with smaller phrase technology. Any transcriptional errors that result from this process are unintentional.

## 2019-02-25 ENCOUNTER — Telehealth: Payer: Self-pay | Admitting: *Deleted

## 2019-02-25 NOTE — Telephone Encounter (Signed)
Lab/MD/ [facility] in 2 weeks. Follow up 15 minutes per MD 02/25/19 staff message Called patient to make her aware of her scheduled 3/30 appt. However, I was unable to  leave her a vmail due to her mailbox being full.  A remainder letter will be mailed out.

## 2019-03-08 ENCOUNTER — Other Ambulatory Visit: Payer: Self-pay

## 2019-03-11 ENCOUNTER — Inpatient Hospital Stay: Payer: Medicare Other

## 2019-03-11 ENCOUNTER — Ambulatory Visit: Payer: Medicare Other | Admitting: Oncology

## 2019-03-11 ENCOUNTER — Inpatient Hospital Stay: Payer: Medicare Other | Admitting: Oncology

## 2019-03-11 ENCOUNTER — Other Ambulatory Visit: Payer: Medicare Other

## 2019-03-11 ENCOUNTER — Other Ambulatory Visit: Payer: Self-pay

## 2019-03-11 ENCOUNTER — Inpatient Hospital Stay (HOSPITAL_BASED_OUTPATIENT_CLINIC_OR_DEPARTMENT_OTHER): Payer: Medicare Other | Admitting: Oncology

## 2019-03-11 ENCOUNTER — Ambulatory Visit: Payer: Medicare Other

## 2019-03-11 ENCOUNTER — Encounter: Payer: Self-pay | Admitting: Oncology

## 2019-03-11 VITALS — BP 130/78 | HR 56 | Temp 95.7°F | Wt 170.0 lb

## 2019-03-11 DIAGNOSIS — R935 Abnormal findings on diagnostic imaging of other abdominal regions, including retroperitoneum: Secondary | ICD-10-CM | POA: Diagnosis not present

## 2019-03-11 DIAGNOSIS — Z7901 Long term (current) use of anticoagulants: Secondary | ICD-10-CM

## 2019-03-11 DIAGNOSIS — Z79899 Other long term (current) drug therapy: Secondary | ICD-10-CM

## 2019-03-11 DIAGNOSIS — D649 Anemia, unspecified: Secondary | ICD-10-CM

## 2019-03-11 DIAGNOSIS — N184 Chronic kidney disease, stage 4 (severe): Secondary | ICD-10-CM

## 2019-03-11 DIAGNOSIS — K922 Gastrointestinal hemorrhage, unspecified: Secondary | ICD-10-CM

## 2019-03-11 DIAGNOSIS — E039 Hypothyroidism, unspecified: Secondary | ICD-10-CM

## 2019-03-11 DIAGNOSIS — E538 Deficiency of other specified B group vitamins: Secondary | ICD-10-CM

## 2019-03-11 DIAGNOSIS — D631 Anemia in chronic kidney disease: Secondary | ICD-10-CM

## 2019-03-11 DIAGNOSIS — R011 Cardiac murmur, unspecified: Secondary | ICD-10-CM

## 2019-03-11 DIAGNOSIS — Z87891 Personal history of nicotine dependence: Secondary | ICD-10-CM

## 2019-03-11 LAB — HEMATOCRIT: HCT: 28.2 % — ABNORMAL LOW (ref 36.0–46.0)

## 2019-03-11 LAB — HEMOGLOBIN: Hemoglobin: 8.3 g/dL — ABNORMAL LOW (ref 12.0–15.0)

## 2019-03-11 MED ORDER — EPOETIN ALFA 20000 UNIT/ML IJ SOLN: 60000.0000 [IU] | Freq: Once | INTRAMUSCULAR | Status: DC

## 2019-03-11 MED ORDER — EPOETIN ALFA 40000 UNIT/ML IJ SOLN
40000.0000 [IU] | Freq: Once | INTRAMUSCULAR | Status: AC
  Administered 2019-03-11: 40000 [IU] via SUBCUTANEOUS
  Filled 2019-03-11: qty 1

## 2019-03-11 MED ORDER — EPOETIN ALFA 20000 UNIT/ML IJ SOLN
20000.0000 [IU] | Freq: Once | INTRAMUSCULAR | Status: AC
  Administered 2019-03-11: 20000 [IU] via SUBCUTANEOUS
  Filled 2019-03-11: qty 1

## 2019-03-11 NOTE — Progress Notes (Signed)
Patient here today for follow up.  Patient states no new concerns today, no SOB or fatigue

## 2019-03-11 NOTE — Progress Notes (Signed)
Hematology/Oncology Follow up note [facility] Telephone:([phone] Fax:([phone]) [phone]   Patient Care Team: Maryland Pink, MD as PCP - General (Family Medicine)  REFERRING PROVIDER: Maryland Pink, MD  REASON FOR VISIT Follow up for treatment of anemia of chronic kidney disease.  HISTORY OF PRESENTING ILLNESS:  Jeanette Romero is a  68 y.o.  female with PMH listed below who was referred to me for evaluation of labile INR. She has extensive cardiac comorbidities including history of aortic mechanic valve replacement, aneurysm of aorta, PAF, Coronary artery disease. She was on Amiodarone and also had drug induced hyperthyroidism.  She was sent to me for evaluation of labile INR.  Extensive review of her medical records from primary care physician's office showed  10/27/2017 INR 5.9,  10/24/2017 INR 4.9 10/12/2017 INR 1.2 10/07/2017 INR 6  10/04/2017 INR 8.1   08/11/2017 INR 7.5  05/24/2017 INR 3.4 04/26/2017 INR 3.5 03/15/2017 INR 3.4 01/31/2017 INR 2.7  08/29/2017 Folate 2.8,  B12 825 and the patient was started on folic acid supplementation.  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 Amiodarone was stopped. And patient was referred to endocrinologist for evaluation. She was found out to have Graves' disease and was started on prednisone. Last TSH that was done in the [facility] system showed a TSH of 4.8.  INTERVAL HISTORY Today patient presents for follow-up for management of anemia of CKD.  Patient was recently hospitalized due to weakness and noted to have acute on chronic anemia, black tarry stool. Status post EGD showing minimal gastritis and possible Barrett esophagus.  Biopsy was not done due to elevated INR and recent bleed.  Colonoscopy showing 3 to 4 mm sized  sessile polyps.  Biopsies were not done.  Patient received PRBC transfusions during her admission.  She was supposed to get Procrit injection around the time of hospitalization and Procrit was held. Patient's anticoagulation was held during hospitalization.  Patient reports that she was started back on Coumadin about 10 days ago.   Denies any acute bleeding events, black tarry stool. Since discharge, she had blood checked and is status post 1 unit of PRBC blood transfusion. Today she reports feeling well.  Chronic fatigue at baseline.  Review of Systems  Constitutional: Positive for malaise/fatigue. Negative for chills, fever and weight loss.  HENT: Negative for sore throat.   Eyes: Negative for redness.  Respiratory: Negative for cough, shortness of breath and wheezing.   Cardiovascular: Negative for chest pain, palpitations and leg swelling.       Irregular heart beats  Gastrointestinal: Negative for abdominal pain, blood in stool, nausea and vomiting.  Genitourinary: Negative for dysuria.  Musculoskeletal: Negative for myalgias.  Skin: Negative for rash.  Neurological: Negative for dizziness, tingling and tremors.  Endo/Heme/Allergies: Does not bruise/bleed easily.  Psychiatric/Behavioral: Negative for hallucinations.    MEDICAL HISTORY:  Past Medical History:  Diagnosis Date  . Anemia in chronic kidney disease (CKD) 11/14/2017  . Anxiety   . Aortic valve regurgitation   . Aortic valve replaced   . Chronic kidney disease   . Coronary artery disease   . Dizziness   . Edema   . GERD (gastroesophageal reflux disease)   . Gout   . Hypercholesterolemia   . Hypertension   . Shortness of breath dyspnea     SURGICAL HISTORY: Past  Surgical History:  Procedure Laterality Date  . ABDOMINAL HYSTERECTOMY    . AORTIC VALVE REPLACEMENT    . CARDIAC CATHETERIZATION    . CARDIAC CATHETERIZATION N/A 05/07/2015   Procedure: Left Heart Cath;  Surgeon: [name], MD;  Location: [facility];  Service: Cardiovascular;  Laterality: N/A;  . CARDIAC VALVE REPLACEMENT    . COLONOSCOPY Left 02/20/2019   Procedure: COLONOSCOPY;  Surgeon: Virgel Manifold, MD;  Location: [facility] ENDOSCOPY;  Service: Endoscopy;  Laterality: Left;  . COLONOSCOPY N/A 02/21/2019   Procedure: COLONOSCOPY;  Surgeon: Virgel Manifold, MD;  Location: [facility] ENDOSCOPY;  Service: Endoscopy;  Laterality: N/A;  . ELECTROPHYSIOLOGIC STUDY N/A 05/05/2015   Procedure: Cardioversion;  Surgeon: [name], MD;  Location: [facility] ORS;  Service: Cardiovascular;  Laterality: N/A;  . ENTEROSCOPY N/A 02/21/2019   Procedure: ENTEROSCOPY;  Surgeon: Virgel Manifold, MD;  Location: [facility] ENDOSCOPY;  Service: Endoscopy;  Laterality: N/A;  . ESOPHAGOGASTRODUODENOSCOPY Left 02/19/2019   Procedure: ESOPHAGOGASTRODUODENOSCOPY (EGD);  Surgeon: Virgel Manifold, MD;  Location[facility] ENDOSCOPY;  Service: Endoscopy;  Laterality: Left;  . REPLACEMENT TOTAL KNEE BILATERAL      SOCIAL HISTORY: Social History   Socioeconomic History  . Marital status: Married    Spouse name: Not on file  . Number of children: Not on file  . Years of education: Not on file  . Highest education level: Not on file  Occupational History  . Not on file  Social Needs  . Financial resource strain: Not on file  . Food insecurity:    Worry: Not on file    Inability: Not on file  . Transportation needs:    Medical: Not on file    Non-medical: Not on file  Tobacco Use  . Smoking status: Former Smoker    Packs/day: 0.50    Years: 20.00    Pack years: 10.00    Types: Cigarettes    Last attempt to quit: 2004    Years since quitting: 16.2  . Smokeless tobacco: Never Used  Substance and Sexual Activity  . Alcohol use: Yes    Alcohol/week: 0.0 standard drinks    Comment: rare  . Drug use: No  . Sexual activity: Not on file  Lifestyle  . Physical activity:    Days per week: Not on file    Minutes per session: Not on file  .  Stress: Not on file  Relationships  . Social connections:    Talks on phone: Not on file    Gets together: Not on file    Attends religious service: Not on file    Active member of club or organization: Not on file    Attends meetings of clubs or organizations: Not on file    Relationship status: Not on file  . Intimate partner violence:    Fear of current or ex partner: Not on file    Emotionally abused: Not on file    Physically abused: Not on file    Forced sexual activity: Not on file  Other Topics Concern  . Not on file  Social History Narrative  . Not on file    FAMILY HISTORY: Family History  Problem Relation Age of Onset  . Hypertension Mother   . Colon cancer Mother   . Prostate cancer Father   . Kidney cancer Brother   . Prostate cancer Brother   . Prostate cancer Brother   . Breast cancer Neg Hx     ALLERGIES:  is allergic to atorvastatin.  MEDICATIONS:  Current Outpatient Medications  Medication Sig Dispense Refill  . ALPRAZolam (XANAX) 0.25 MG tablet Take 0.25 mg by mouth 2 (two) times daily.    Marland Kitchen amLODipine (NORVASC) 10 MG tablet Take 5 mg by mouth daily.     Marland Kitchen amoxicillin (AMOXIL) 500 MG tablet Take 2,000 mg by mouth as needed.    Marland Kitchen aspirin EC 81 MG tablet Take 81 mg by mouth daily.    . benazepril (LOTENSIN) 40 MG tablet Take 40 mg by mouth daily.    . cloNIDine (CATAPRES) 0.3 MG tablet Take 0.3 mg 3 (three) times daily by mouth.  2  . folic acid (FOLVITE) 1 MG tablet TAKE 1 TABLET(1 MG) BY MOUTH DAILY 90 tablet 4  . furosemide (LASIX) 20 MG tablet Take 20-40 mg by mouth daily. Take one tablet on odd days and two tablets on even days    . methimazole (TAPAZOLE) 5 MG tablet Take 5 mg by mouth daily.   11  . pantoprazole (PROTONIX) 20 MG tablet Take 1 tablet (20 mg total) by mouth daily. 30 tablet 2  . potassium chloride (K-DUR) 10 MEQ tablet Take 10 mEq by mouth daily.     . rosuvastatin (CRESTOR) 5 MG tablet Take 5 mg by mouth at bedtime.  2  . SODIUM  BICARBONATE, ANTACID, PO Take 10 mg by mouth.    . warfarin (COUMADIN) 2 MG tablet Take 4 mg by mouth daily.     Marland Kitchen warfarin (COUMADIN) 4 MG tablet Take 4 mg by mouth daily.     No current facility-administered medications for this visit.      PHYSICAL EXAMINATION: ECOG PERFORMANCE STATUS: 1 - Symptomatic but completely ambulatory Vitals:   03/11/19 0947  BP: 130/78  Pulse: (!) 56  Temp: (!) 95.7 F (35.4 C)   Filed Weights   03/11/19 0947  Weight: 170 lb (77.1 kg)    Physical Exam  Constitutional: She is oriented to person, place, and time. No distress.  HENT:  Head: Normocephalic and atraumatic.  Nose: Nose normal.  Mouth/Throat: Oropharynx is clear and moist. No oropharyngeal exudate.  Eyes: Pupils are equal, round, and reactive to light. EOM are normal. Left eye exhibits no discharge. No scleral icterus.  pallor  Neck: Normal range of motion. Neck supple. No JVD present.  Cardiovascular: Normal rate and regular rhythm.  Murmur heard. Irregular beats  Pulmonary/Chest: Effort normal. No respiratory distress. She has no wheezes. She has no rales. She exhibits no tenderness.  Decreased breath sounds bilaterally.  No crackles  Abdominal: Soft. Bowel sounds are normal. She exhibits no distension and no mass. There is no abdominal tenderness. There is no rebound.  Musculoskeletal: Normal range of motion.        General: No tenderness or edema.  Lymphadenopathy:    She has no cervical adenopathy.  Neurological: She is alert and oriented to person, place, and time. No cranial nerve deficit. She exhibits normal muscle tone. Gait normal. Coordination normal.  Skin: Skin is warm and dry. No rash noted. She is not diaphoretic. No erythema.  Psychiatric: Mood and affect normal.     LABORATORY DATA:  I have reviewed the data as listed Lab Results  Component Value Date   WBC 6.6 02/22/2019   HGB 8.3 (L) 03/11/2019   HCT 28.2 (L) 03/11/2019   MCV 99.3 02/22/2019   PLT 212  02/22/2019   Recent Labs    03/13/18 1040  06/28/18 1841  02/19/19  5643 02/20/19 0408 02/21/19 0454  NA 141   < > 144   < > 144 140 139  K 3.4*   < > 4.5   < > 4.3 4.9 4.1  CL 108   < > 110   < > 114* 113* 111  CO2 23   < > 24   < > 18* 19* 20*  GLUCOSE 102*   < > 92   < > 104* 92 106*  BUN 18   < > 30*   < > 68* 61* 54*  CREATININE 2.18*   < > 2.08*   < > 2.17* 2.20* 2.25*  CALCIUM 9.2   < > 10.1   < > 9.2 9.0 8.9  GFRNONAA 22*   < > 24*   < > 23* 22* 22*  GFRAA 26*   < > 27*   < > 26* 26* 25*  PROT 6.5  --  7.5  --   --   --   --   ALBUMIN 3.2*  --  3.9  --   --   --   --   AST 14*  --  18  --   --   --   --   ALT 9*  --  10  --   --   --   --   ALKPHOS 93  --  156*  --   --   --   --   BILITOT 0.6  --  0.5  --   --   --   --    < > = values in this interval not displayed.   Iron/TIBC/Ferritin/ %Sat    Component Value Date/Time   IRON 74 02/18/2019 0550   TIBC 325 02/18/2019 0550   FERRITIN 125 02/18/2019 0550   IRONPCTSAT 23 02/18/2019 0550    08/29/2017 Folate 2.8,  B12 825              01/02/2018 TSH 3.05                                                                                                                                                        RADIOGRAPHIC STUDIES: I have personally reviewed the radiological images as listed and agreed with the findings in the report. 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  Mild aneurysmal dilation of the proximal abdominal aorta 3.7 cm.  Femorofemoral crossover graft and right axillo femoral bypass graft noted.  Rounded fluid collection surrounds the distal aspect of the axillofemoral bypass graft.  Presumably postoperative seroma.  Moderate stool burden in the colon.  2 cm soft tissue nodule in the left posterior mediastinum presumably mildly enlarged lymph node.  This  could  be followed with a repeat CT chest in 6 months to assess stability.  Cardiomegaly.  CAD.                                                                                                                                                                                                                                                                                                                                          ASSESSMENT & PLAN:  1. Gastrointestinal hemorrhage, unspecified gastrointestinal hemorrhage type   2. Chronic anticoagulation   3. Anemia in stage 4 chronic kidney disease ([place])   4. Encounter for ESA (erythropoietin stimulating agent) anemia management    #Anemia of chronic kidney disease Hemoglobin 8.3 today.  We will proceed with Procrit 60,000 unit today.  #Recent history of GI bleeding due to chronic anticoagulation.  Anticoagulation was temporarily held and patient was given PRBC transfusions anticoagulation has been resumed now. Will need to monitor patient's counts closely. Recommend patient to return and repeat a CBC in 2 weeks, will also check iron TIBC ferritin. If she needs additional IV iron will proceed.  Discussed with patient and she is in agreement.  #History of folic acid deficiency, loud heart murmur, continue folic acid supplements daily.:  #Hypothyroidism, patient is on methimazole.  Check CBC in 2 weeks.Marland Kitchen #Abnormal CT finding: Patient had CT abdomen pelvis without contrast on 06/28/2018 for evaluation of right lower abdomen swelling.  There were incidental findings of 2 cm soft tissue nodule in the left posterior mediastinum.  Questionable enlarged lymph nodes.  I discussed with patient about the need of repeat a CT chest to evaluate the stability.  Patient has not update me about her decision of proceeding with CT scanning to evaluate stability.  Will discuss with her at the next visit.  All questions were answered. The patient knows to call the clinic with any  problems questions or  concerns. Orders Placed This Encounter  Procedures  . CBC with Differential/Platelet    Standing Status:   Future    Standing Expiration Date:   03/10/2020  . Iron and TIBC    Standing Status:   Future    Standing Expiration Date:   03/10/2020  . Ferritin    Standing Status:   Future    Standing Expiration Date:   03/10/2020  . Retic Panel    Standing Status:   Future    Standing Expiration Date:   03/10/2020     Return of visit: Labs in 2 weeks, check CBC, iron, TIBC ferritin. CBC in 4 weeks +/-Procrit.  Earlie Server, MD, PhD Hematology Oncology [facility] Pager- [phone] 03/11/19

## 2019-03-21 ENCOUNTER — Other Ambulatory Visit: Payer: Medicare Other

## 2019-03-22 ENCOUNTER — Ambulatory Visit: Payer: Medicare Other | Admitting: Oncology

## 2019-03-22 ENCOUNTER — Ambulatory Visit: Payer: Medicare Other

## 2019-03-24 ENCOUNTER — Other Ambulatory Visit: Payer: Self-pay

## 2019-03-25 ENCOUNTER — Other Ambulatory Visit: Payer: Self-pay

## 2019-03-25 ENCOUNTER — Inpatient Hospital Stay: Payer: Medicare Other | Attending: Oncology

## 2019-03-25 DIAGNOSIS — D649 Anemia, unspecified: Secondary | ICD-10-CM

## 2019-03-25 DIAGNOSIS — K922 Gastrointestinal hemorrhage, unspecified: Secondary | ICD-10-CM | POA: Diagnosis present

## 2019-03-25 DIAGNOSIS — Z79899 Other long term (current) drug therapy: Secondary | ICD-10-CM

## 2019-03-25 DIAGNOSIS — D631 Anemia in chronic kidney disease: Secondary | ICD-10-CM | POA: Diagnosis not present

## 2019-03-25 DIAGNOSIS — Z7901 Long term (current) use of anticoagulants: Secondary | ICD-10-CM

## 2019-03-25 DIAGNOSIS — N184 Chronic kidney disease, stage 4 (severe): Secondary | ICD-10-CM | POA: Insufficient documentation

## 2019-03-25 LAB — CBC WITH DIFFERENTIAL/PLATELET
Abs Immature Granulocytes: 0.02 10*3/uL (ref 0.00–0.07)
Basophils Absolute: 0 10*3/uL (ref 0.0–0.1)
Basophils Relative: 1 %
Eosinophils Absolute: 0.2 10*3/uL (ref 0.0–0.5)
Eosinophils Relative: 4 %
HCT: 32.1 % — ABNORMAL LOW (ref 36.0–46.0)
Hemoglobin: 9.5 g/dL — ABNORMAL LOW (ref 12.0–15.0)
Immature Granulocytes: 0 %
Lymphocytes Relative: 15 %
Lymphs Abs: 0.8 10*3/uL (ref 0.7–4.0)
MCH: 27.5 pg (ref 26.0–34.0)
MCHC: 29.6 g/dL — ABNORMAL LOW (ref 30.0–36.0)
MCV: 92.8 fL (ref 80.0–100.0)
Monocytes Absolute: 0.5 10*3/uL (ref 0.1–1.0)
Monocytes Relative: 10 %
Neutro Abs: 3.7 10*3/uL (ref 1.7–7.7)
Neutrophils Relative %: 70 %
Platelets: 199 10*3/uL (ref 150–400)
RBC: 3.46 MIL/uL — ABNORMAL LOW (ref 3.87–5.11)
RDW: 15.9 % — ABNORMAL HIGH (ref 11.5–15.5)
WBC: 5.2 10*3/uL (ref 4.0–10.5)
nRBC: 0 % (ref 0.0–0.2)

## 2019-03-25 LAB — RETIC PANEL
Immature Retic Fract: 15.2 % (ref 2.3–15.9)
RBC.: 3.46 MIL/uL — ABNORMAL LOW (ref 3.87–5.11)
Retic Count, Absolute: 90.3 10*3/uL (ref 19.0–186.0)
Retic Ct Pct: 2.6 % (ref 0.4–3.1)
Reticulocyte Hemoglobin: 26.5 pg — ABNORMAL LOW (ref >27.9)

## 2019-03-25 LAB — FERRITIN: Ferritin: 104 ng/mL (ref 11–307)

## 2019-03-25 LAB — IRON AND TIBC
Iron: 33 ug/dL (ref 28–170)
Saturation Ratios: 11 % (ref 10.4–31.8)
TIBC: 315 ug/dL (ref 250–450)
UIBC: 282 ug/dL

## 2019-04-05 ENCOUNTER — Inpatient Hospital Stay: Payer: Medicare Other

## 2019-04-05 ENCOUNTER — Other Ambulatory Visit: Payer: Self-pay

## 2019-04-05 VITALS — BP 128/73 | HR 79 | Resp 18

## 2019-04-05 DIAGNOSIS — N184 Chronic kidney disease, stage 4 (severe): Principal | ICD-10-CM

## 2019-04-05 DIAGNOSIS — K922 Gastrointestinal hemorrhage, unspecified: Secondary | ICD-10-CM | POA: Diagnosis not present

## 2019-04-05 DIAGNOSIS — D631 Anemia in chronic kidney disease: Secondary | ICD-10-CM

## 2019-04-05 LAB — CBC WITH DIFFERENTIAL/PLATELET
Abs Immature Granulocytes: 0.02 10*3/uL (ref 0.00–0.07)
Basophils Absolute: 0.1 10*3/uL (ref 0.0–0.1)
Basophils Relative: 1 %
Eosinophils Absolute: 0.3 10*3/uL (ref 0.0–0.5)
Eosinophils Relative: 5 %
HCT: 31.7 % — ABNORMAL LOW (ref 36.0–46.0)
Hemoglobin: 9.5 g/dL — ABNORMAL LOW (ref 12.0–15.0)
Immature Granulocytes: 0 %
Lymphocytes Relative: 19 %
Lymphs Abs: 1 10*3/uL (ref 0.7–4.0)
MCH: 27.3 pg (ref 26.0–34.0)
MCHC: 30 g/dL (ref 30.0–36.0)
MCV: 91.1 fL (ref 80.0–100.0)
Monocytes Absolute: 0.5 10*3/uL (ref 0.1–1.0)
Monocytes Relative: 9 %
Neutro Abs: 3.6 10*3/uL (ref 1.7–7.7)
Neutrophils Relative %: 66 %
Platelets: 212 10*3/uL (ref 150–400)
RBC: 3.48 MIL/uL — ABNORMAL LOW (ref 3.87–5.11)
RDW: 15.8 % — ABNORMAL HIGH (ref 11.5–15.5)
WBC: 5.4 10*3/uL (ref 4.0–10.5)
nRBC: 0 % (ref 0.0–0.2)

## 2019-04-05 MED ORDER — EPOETIN ALFA 40000 UNIT/ML IJ SOLN
40000.0000 [IU] | Freq: Once | INTRAMUSCULAR | Status: AC
  Administered 2019-04-05: 40000 [IU] via SUBCUTANEOUS

## 2019-04-05 MED ORDER — EPOETIN ALFA 20000 UNIT/ML IJ SOLN
20000.0000 [IU] | Freq: Once | INTRAMUSCULAR | Status: AC
  Administered 2019-04-05: 20000 [IU] via SUBCUTANEOUS

## 2019-04-05 MED ORDER — EPOETIN ALFA 20000 UNIT/ML IJ SOLN: 60000.0000 [IU] | Freq: Once | INTRAMUSCULAR | Status: DC

## 2019-04-08 ENCOUNTER — Inpatient Hospital Stay: Payer: Medicare Other

## 2019-04-08 ENCOUNTER — Inpatient Hospital Stay: Payer: Medicare Other | Admitting: Oncology

## 2019-04-08 ENCOUNTER — Other Ambulatory Visit: Payer: Self-pay

## 2019-04-10 ENCOUNTER — Telehealth: Payer: Self-pay | Admitting: *Deleted

## 2019-04-10 ENCOUNTER — Other Ambulatory Visit: Payer: Self-pay

## 2019-04-10 NOTE — Telephone Encounter (Signed)
Per patient request to R/S 04/08/19 appts. Called to R/S patient 04/08/19 lab/MD DOX VISIT/INJ appts. Left her a detailed message on her vmail to give the office a call back to R/S her appts

## 2019-04-11 ENCOUNTER — Inpatient Hospital Stay: Payer: Medicare Other | Admitting: Oncology

## 2019-04-24 ENCOUNTER — Inpatient Hospital Stay: Payer: Medicare Other | Attending: Oncology | Admitting: Oncology

## 2019-04-24 ENCOUNTER — Other Ambulatory Visit: Payer: Self-pay

## 2019-04-24 ENCOUNTER — Encounter: Payer: Self-pay | Admitting: Oncology

## 2019-04-24 DIAGNOSIS — R9389 Abnormal findings on diagnostic imaging of other specified body structures: Secondary | ICD-10-CM

## 2019-04-24 DIAGNOSIS — N184 Chronic kidney disease, stage 4 (severe): Secondary | ICD-10-CM

## 2019-04-24 DIAGNOSIS — D509 Iron deficiency anemia, unspecified: Secondary | ICD-10-CM

## 2019-04-24 DIAGNOSIS — Z79899 Other long term (current) drug therapy: Secondary | ICD-10-CM

## 2019-04-24 DIAGNOSIS — D631 Anemia in chronic kidney disease: Secondary | ICD-10-CM | POA: Insufficient documentation

## 2019-04-24 DIAGNOSIS — D649 Anemia, unspecified: Secondary | ICD-10-CM | POA: Diagnosis not present

## 2019-04-24 MED ORDER — IRON-VITAMIN C 65-125 MG PO TABS: 1.0000 | ORAL_TABLET | Freq: Two times a day (BID) | ORAL | 3 refills | Status: DC

## 2019-04-24 NOTE — Progress Notes (Signed)
HEMATOLOGY-ONCOLOGY TeleHEALTH VISIT PROGRESS NOTE  I connected with Jeanette Romero on 04/24/19 at 11:30 AM EDT by video enabled telemedicine visit and verified that I am speaking with the correct person using two identifiers. I discussed the limitations, risks, security and privacy concerns of performing an evaluation and management service by telemedicine and the availability of in-person appointments. I also discussed with the patient that there may be a patient responsible charge related to this service. The patient expressed understanding and agreed to proceed.   Other persons participating in the visit and their role in the encounter:  Geraldine Solar, CMA, check in patient    Patient's location: Home  Provider's location: Home office Chief Complaint: Follow-up for management of anemia secondary to chronic kidney disease.   INTERVAL HISTORY Jeanette Romero is a 68 y.o. female who has above history reviewed by me today presents for follow up visit for management of anemia secondary to chronic kidney disease.   Problems and complaints are listed below:  Patient was previously scheduled in March and April.  Our clinical staff contacted her multiple times and her phone went to voicemail.  Messages were left for patient to call back and reschedule. Patient's daughter called recently and while appointment to be scheduled. Patient reports that she received phone calls 1 day before scheduled clinic today, -which is the travel screening phone call, -patient reports that she did not noticed phone calls and she was awaiting me to send her messages to initiate the [place] visits.  Since clinical staff Almyra Free was not able to connect the patient prior to her visits, I was not able to evaluate patient using video enabled telemedicine visit.  Patient reports feeling well.Fatigue: Chronic onset, perisistent, no aggravating or improving factors, no associated symptoms.,  Fatigue level is stable. Denies  any recent hospitalization, infection, nausea, vomiting, fever, chills, abdominal pain or chest pain.  Denies any lower extremity swelling.   Review of Systems  Constitutional: Positive for fatigue. Negative for appetite change, chills and fever.  HENT:   Negative for hearing loss and voice change.   Eyes: Negative for eye problems.  Respiratory: Negative for chest tightness and cough.   Cardiovascular: Negative for chest pain.  Gastrointestinal: Negative for abdominal distention, abdominal pain and blood in stool.  Endocrine: Negative for hot flashes.  Genitourinary: Negative for difficulty urinating and frequency.   Musculoskeletal: Negative for arthralgias.  Skin: Negative for itching and rash.  Neurological: Negative for extremity weakness.  Hematological: Negative for adenopathy.  Psychiatric/Behavioral: Negative for confusion.    Past Medical History:  Diagnosis Date  . Anemia in chronic kidney disease (CKD) 11/14/2017  . Anxiety   . Aortic valve regurgitation   . Aortic valve replaced   . Chronic kidney disease   . Coronary artery disease   . Dizziness   . Edema   . GERD (gastroesophageal reflux disease)   . Gout   . Hypercholesterolemia   . Hypertension   . Shortness of breath dyspnea    Past Surgical History:  Procedure Laterality Date  . ABDOMINAL HYSTERECTOMY    . AORTIC VALVE REPLACEMENT    . CARDIAC CATHETERIZATION    . CARDIAC CATHETERIZATION N/A 05/07/2015   Procedure: Left Heart Cath;  Surgeon: [name], MD;  Location: [facility];  Service: Cardiovascular;  Laterality: N/A;  . CARDIAC VALVE REPLACEMENT    . COLONOSCOPY Left 02/20/2019   Procedure: COLONOSCOPY;  Surgeon: Virgel Manifold, MD;  Location: [facility];  Service:  Endoscopy;  Laterality: Left;  . COLONOSCOPY N/A 02/21/2019   Procedure: COLONOSCOPY;  Surgeon: Virgel Manifold, MD;  Location: [facility];  Service: Endoscopy;  Laterality: N/A;  . ELECTROPHYSIOLOGIC STUDY  N/A 05/05/2015   Procedure: Cardioversion;  Surgeon: [name], MD;  Location: [facility];  Service: Cardiovascular;  Laterality: N/A;  . ENTEROSCOPY N/A 02/21/2019   Procedure: ENTEROSCOPY;  Surgeon: Virgel Manifold, MD;  Location: [facility];  Service: Endoscopy;  Laterality: N/A;  . ESOPHAGOGASTRODUODENOSCOPY Left 02/19/2019   Procedure: ESOPHAGOGASTRODUODENOSCOPY (EGD);  Surgeon: Virgel Manifold, MD;  Location: [facility];  Service: Endoscopy;  Laterality: Left;  . REPLACEMENT TOTAL KNEE BILATERAL      Family History  Problem Relation Age of Onset  . Hypertension Mother   . Colon cancer Mother   . Prostate cancer Father   . Kidney cancer Brother   . Prostate cancer Brother   . Prostate cancer Brother   . Breast cancer Neg Hx     Social History   Socioeconomic History  . Marital status: Married    Spouse name: Not on file  . Number of children: Not on file  . Years of education: Not on file  . Highest education level: Not on file  Occupational History  . Not on file  Social Needs  . Financial resource strain: Not on file  . Food insecurity:    Worry: Not on file    Inability: Not on file  . Transportation needs:    Medical: Not on file    Non-medical: Not on file  Tobacco Use  . Smoking status: Former Smoker    Packs/day: 0.50    Years: 20.00    Pack years: 10.00    Types: Cigarettes    Last attempt to quit: 2004    Years since quitting: 16.3  . Smokeless tobacco: Never Used  Substance and Sexual Activity  . Alcohol use: Yes    Alcohol/week: 0.0 standard drinks    Comment: rare  . Drug use: No  . Sexual activity: Not on file  Lifestyle  . Physical activity:    Days per week: Not on file    Minutes per session: Not on file  . Stress: Not on file  Relationships  . Social connections:    Talks on phone: Not on file    Gets together: Not on file    Attends religious service: Not on file    Active member of club or organization: Not on file     Attends meetings of clubs or organizations: Not on file    Relationship status: Not on file  . Intimate partner violence:    Fear of current or ex partner: Not on file    Emotionally abused: Not on file    Physically abused: Not on file    Forced sexual activity: Not on file  Other Topics Concern  . Not on file  Social History Narrative  . Not on file    Current Outpatient Medications on File Prior to Visit  Medication Sig Dispense Refill  . ALPRAZolam (XANAX) 0.25 MG tablet Take 0.25 mg by mouth 2 (two) times daily.    Marland Kitchen amLODipine (NORVASC) 10 MG tablet Take 5 mg by mouth daily.     Marland Kitchen aspirin EC 81 MG tablet Take 81 mg by mouth daily.    . benazepril (LOTENSIN) 40 MG tablet Take 40 mg by mouth daily.    . cloNIDine (CATAPRES) 0.3 MG tablet Take 0.3 mg 3 (  three) times daily by mouth.  2  . folic acid (FOLVITE) 1 MG tablet TAKE 1 TABLET(1 MG) BY MOUTH DAILY 90 tablet 4  . furosemide (LASIX) 20 MG tablet Take 20-40 mg by mouth daily. Take one tablet on odd days and two tablets on even days    . methimazole (TAPAZOLE) 5 MG tablet Take 5 mg by mouth daily.   11  . pantoprazole (PROTONIX) 20 MG tablet Take 1 tablet (20 mg total) by mouth daily. 30 tablet 2  . potassium chloride (K-DUR) 10 MEQ tablet Take 10 mEq by mouth daily.     . rosuvastatin (CRESTOR) 5 MG tablet Take 5 mg by mouth at bedtime.  2  . SODIUM BICARBONATE, ANTACID, PO Take 10 mg by mouth.    . warfarin (COUMADIN) 2 MG tablet Take 4 mg by mouth daily.     Marland Kitchen warfarin (COUMADIN) 4 MG tablet Take 4 mg by mouth daily.    Marland Kitchen amoxicillin (AMOXIL) 500 MG tablet Take 2,000 mg by mouth as needed. For dental procedures     No current facility-administered medications on file prior to visit.     Allergies  Allergen Reactions  . Atorvastatin     Other reaction(s): Liver Disorder Elevated liver enzymes       Observations/Objective: There were no vitals filed for this visit. There is no height or weight on file to calculate  BMI.  Physical Exam  Constitutional: She is oriented to person, place, and time. No distress.  HENT:  Head: Atraumatic.  Pulmonary/Chest: Effort normal.  Neurological: She is alert and oriented to person, place, and time.  Psychiatric: Affect normal.    CBC    Component Value Date/Time   WBC 5.4 04/05/2019 1356   RBC 3.48 (L) 04/05/2019 1356   HGB 9.5 (L) 04/05/2019 1356   HGB 8.4 (L) 03/10/2012 0457   HCT 31.7 (L) 04/05/2019 1356   HCT 25.2 (L) 03/10/2012 0457   PLT 212 04/05/2019 1356   PLT 179 03/10/2012 0457   MCV 91.1 04/05/2019 1356   MCV 88 03/10/2012 0457   MCH 27.3 04/05/2019 1356   MCHC 30.0 04/05/2019 1356   RDW 15.8 (H) 04/05/2019 1356   RDW 15.7 (H) 03/10/2012 0457   LYMPHSABS 1.0 04/05/2019 1356   LYMPHSABS 0.8 (L) 03/10/2012 0457   MONOABS 0.5 04/05/2019 1356   MONOABS 1.2 (H) 03/10/2012 0457   EOSABS 0.3 04/05/2019 1356   EOSABS 0.0 03/10/2012 0457   BASOSABS 0.1 04/05/2019 1356   BASOSABS 0.0 03/10/2012 0457    CMP     Component Value Date/Time   NA 139 02/21/2019 0454   NA 138 03/10/2012 0457   K 4.1 02/21/2019 0454   K 4.0 03/10/2012 0457   CL 111 02/21/2019 0454   CL 103 03/10/2012 0457   CO2 20 (L) 02/21/2019 0454   CO2 25 03/10/2012 0457   GLUCOSE 106 (H) 02/21/2019 0454   GLUCOSE 99 03/10/2012 0457   BUN 54 (H) 02/21/2019 0454   BUN 23 (H) 03/10/2012 0457   CREATININE 2.25 (H) 02/21/2019 0454   CREATININE 1.32 (H) 03/10/2012 0457   CALCIUM 8.9 02/21/2019 0454   CALCIUM 8.8 03/10/2012 0457   PROT 7.5 06/28/2018 1841   ALBUMIN 3.9 06/28/2018 1841   AST 18 06/28/2018 1841   ALT 10 06/28/2018 1841   ALKPHOS 156 (H) 06/28/2018 1841   BILITOT 0.5 06/28/2018 1841   GFRNONAA 22 (L) 02/21/2019 0454   GFRNONAA 43 (L) 03/10/2012 0457  GFRAA 25 (L) 02/21/2019 0454   GFRAA 53 (L) 03/10/2012 0457    RADIOGRAPHIC STUDIES: I have personally reviewed the radiological images as listed and agreed with the findings in the report. 02/20/2019 DG  chest 2 view Interval development of moderate left pleural effusion with associated left basilar atelectasis or infiltrate.  06/29/2019 CT abdomen pelvis wo contrast.  Extensive vascular disease with evidence of chronic calcified dissection in the visualized distal thoracic aorta and throughout the abdominal aorta. 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  Fem-fem crossover graft and right axillofemoral bypass graft noted. Rounded fluid collection surrounds the distal aspect of the axillofemoral bypass graft, presumably postoperative seroma.  Moderate stool burden in the colon.  2 cm soft tissue nodule in the left posterior mediastinum, presumably mildly enlarged lymph node. This could be followed with repeat chest CT in 6 months to assess stability.   Assessment and Plan: 1. Anemia of chronic renal failure, stage 4 (severe) ([place])   2. Encounter for ESA (erythropoietin stimulating agent) anemia management   3. Iron deficiency anemia, unspecified iron deficiency anemia type   4. Abnormal CT scan     Patient's labs were reviewed and discussed with patient in details. She had lab work done on 04/05/2019 which showed hemoglobin 9.5. Patient missed monthly Procrit x 2. Recommend patient to have H&H checked on 04/25/2019 and proceed with Procrit injection 60,000 on 04/24/2019 if hemoglobin <=10, and vital signs meeting treatment criteria.  History of iron deficiency.  Iron panel was reviewed and discussed with patient. Ferritin 104, saturation ratio 11.  Due to COVID-19 pandemic, I will hold additional IV Venofer at this point. Recommend patient to start oral iron supplementation.  Vitron C1 tablet twice daily.  Prescription was sent to patient's pharmacy.  #Abnormal CT scan, 2 cm soft tissue nodule in the left posterior mediastinum.  Presumably mildly enlarged lymph node.  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 CT was delayed due to her recent hospitalization due to GI bleeding.  She was recently seen by nephrology and was given instructions prior to CT scan.  This study will also provide information on her mediastinal soft tissue.  I will hold ordering another CT at this point.    Follow Up Instructions: Follow-up lab MD assessment +/- Procrit in 4 weeks.    I discussed the assessment and treatment plan with the patient. The patient was provided an opportunity to ask questions and all were answered. The patient agreed with the plan and demonstrated an understanding of the instructions.  The patient was advised to call back or seek an in-person evaluation if the symptoms worsen or if the condition fails to improve as anticipated.   I provided 25 minutes of face-to-face video visit time during this encounter, and > 50% was spent counseling as documented under my assessment & plan.   Earlie Server, MD 04/24/2019 9:25 PM

## 2019-04-24 NOTE — Progress Notes (Signed)
Called patient for Telehealth visit via [employer].  Patient states no new concerns today.  

## 2019-04-25 ENCOUNTER — Other Ambulatory Visit: Payer: Self-pay

## 2019-04-25 ENCOUNTER — Inpatient Hospital Stay: Payer: Medicare Other

## 2019-04-25 VITALS — BP 100/68 | HR 60 | Resp 18

## 2019-04-25 DIAGNOSIS — N184 Chronic kidney disease, stage 4 (severe): Secondary | ICD-10-CM

## 2019-04-25 DIAGNOSIS — D631 Anemia in chronic kidney disease: Secondary | ICD-10-CM

## 2019-04-25 LAB — HEMOGLOBIN: Hemoglobin: 9.5 g/dL — ABNORMAL LOW (ref 12.0–15.0)

## 2019-04-25 LAB — HEMATOCRIT: HCT: 33.1 % — ABNORMAL LOW (ref 36.0–46.0)

## 2019-04-25 MED ORDER — EPOETIN ALFA 40000 UNIT/ML IJ SOLN
40000.0000 [IU] | Freq: Once | INTRAMUSCULAR | Status: AC
  Administered 2019-04-25: 11:00:00 40000 [IU] via SUBCUTANEOUS

## 2019-04-25 MED ORDER — EPOETIN ALFA 20000 UNIT/ML IJ SOLN
20000.0000 [IU] | Freq: Once | INTRAMUSCULAR | Status: AC
  Administered 2019-04-25: 20000 [IU] via SUBCUTANEOUS

## 2019-04-25 MED ORDER — EPOETIN ALFA 20000 UNIT/ML IJ SOLN: 60000.0000 [IU] | Freq: Once | INTRAMUSCULAR | Status: DC

## 2019-05-21 ENCOUNTER — Other Ambulatory Visit: Payer: Self-pay

## 2019-05-22 ENCOUNTER — Inpatient Hospital Stay (HOSPITAL_BASED_OUTPATIENT_CLINIC_OR_DEPARTMENT_OTHER): Payer: Medicare Other | Admitting: Oncology

## 2019-05-22 ENCOUNTER — Inpatient Hospital Stay: Payer: Medicare Other

## 2019-05-22 ENCOUNTER — Inpatient Hospital Stay: Payer: Medicare Other | Attending: Oncology

## 2019-05-22 ENCOUNTER — Other Ambulatory Visit: Payer: Self-pay

## 2019-05-22 ENCOUNTER — Encounter: Payer: Self-pay | Admitting: Oncology

## 2019-05-22 VITALS — BP 151/85 | HR 74 | Temp 97.1°F | Wt 178.1 lb

## 2019-05-22 DIAGNOSIS — Z79899 Other long term (current) drug therapy: Secondary | ICD-10-CM

## 2019-05-22 DIAGNOSIS — Z87891 Personal history of nicotine dependence: Secondary | ICD-10-CM

## 2019-05-22 DIAGNOSIS — D631 Anemia in chronic kidney disease: Secondary | ICD-10-CM

## 2019-05-22 DIAGNOSIS — N184 Chronic kidney disease, stage 4 (severe): Secondary | ICD-10-CM

## 2019-05-22 DIAGNOSIS — E538 Deficiency of other specified B group vitamins: Secondary | ICD-10-CM

## 2019-05-22 DIAGNOSIS — R9389 Abnormal findings on diagnostic imaging of other specified body structures: Secondary | ICD-10-CM | POA: Diagnosis not present

## 2019-05-22 DIAGNOSIS — D649 Anemia, unspecified: Secondary | ICD-10-CM

## 2019-05-22 LAB — CBC WITH DIFFERENTIAL/PLATELET
Abs Immature Granulocytes: 0.01 10*3/uL (ref 0.00–0.07)
Basophils Absolute: 0 10*3/uL (ref 0.0–0.1)
Basophils Relative: 1 %
Eosinophils Absolute: 0.2 10*3/uL (ref 0.0–0.5)
Eosinophils Relative: 4 %
HCT: 33 % — ABNORMAL LOW (ref 36.0–46.0)
Hemoglobin: 9.8 g/dL — ABNORMAL LOW (ref 12.0–15.0)
Immature Granulocytes: 0 %
Lymphocytes Relative: 21 %
Lymphs Abs: 1 10*3/uL (ref 0.7–4.0)
MCH: 26.6 pg (ref 26.0–34.0)
MCHC: 29.7 g/dL — ABNORMAL LOW (ref 30.0–36.0)
MCV: 89.7 fL (ref 80.0–100.0)
Monocytes Absolute: 0.4 10*3/uL (ref 0.1–1.0)
Monocytes Relative: 9 %
Neutro Abs: 3 10*3/uL (ref 1.7–7.7)
Neutrophils Relative %: 65 %
Platelets: 173 10*3/uL (ref 150–400)
RBC: 3.68 MIL/uL — ABNORMAL LOW (ref 3.87–5.11)
RDW: 16.2 % — ABNORMAL HIGH (ref 11.5–15.5)
WBC: 4.6 10*3/uL (ref 4.0–10.5)
nRBC: 0 % (ref 0.0–0.2)

## 2019-05-22 MED ORDER — EPOETIN ALFA 20000 UNIT/ML IJ SOLN
60000.0000 [IU] | Freq: Once | INTRAMUSCULAR | Status: AC
  Administered 2019-05-22: 60000 [IU] via SUBCUTANEOUS
  Filled 2019-05-22: qty 3

## 2019-05-22 NOTE — Progress Notes (Signed)
Patient here today for follow up.  Patient states no new concerns today  

## 2019-05-22 NOTE — Progress Notes (Signed)
Hematology/Oncology Follow up note [facility] [phone] [phone]   Patient Care Team: Maryland Pink, MD as PCP - General (Family Medicine)  REFERRING PROVIDER: Maryland Pink, MD  REASON FOR VISIT Follow up for treatment of anemia of chronic kidney disease.  HISTORY OF PRESENTING ILLNESS:  Jeanette Romero is a  68 y.o.  female with PMH listed below who was referred to me for evaluation of labile INR. She has extensive cardiac comorbidities including history of aortic mechanic valve replacement, aneurysm of aorta, PAF, Coronary artery disease. She was on Amiodarone and also had drug induced hyperthyroidism.  She was sent to me for evaluation of labile INR.  Extensive review of her medical records from primary care physician's office showed  10/27/2017 INR 5.9,  10/24/2017 INR 4.9 10/12/2017 INR 1.2 10/07/2017 INR 6  10/04/2017 INR 8.1   08/11/2017 INR 7.5  05/24/2017 INR 3.4 04/26/2017 INR 3.5 03/15/2017 INR 3.4 01/31/2017 INR 2.7  08/29/2017 Folate 2.8,  B12 825 and the patient was started on folic acid supplementation.  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 Amiodarone was stopped. And patient was referred to endocrinologist for evaluation. She was found out to have Graves' disease and was started on prednisone. Last TSH that was done in the [facility] system showed a TSH of 4.8.  # 3/9-3/13/2020  hospitalized due to weakness and noted to have acute on chronic anemia, black tarry stool. Status post EGD showing minimal gastritis and possible Barrett esophagus.  Biopsy was not done due to elevated INR and recent bleed.  Colonoscopy showing 3 to 4 mm sized sessile polyps.  Biopsies were not done.  Patient received PRBC transfusions during her  admission   INTERVAL HISTORY 68 y.o. female  presents for follow-up for management of anemia of CKD.  Feels better today. Energy level has improved.  Mood is better.  Denies any chest pain, shortness of breath, abdominal pain today.  No fever or chills.   .  Review of Systems  Constitutional: Positive for malaise/fatigue. Negative for chills, fever and weight loss.  HENT: Negative for sore throat.   Eyes: Negative for redness.  Respiratory: Negative for cough, shortness of breath and wheezing.   Cardiovascular: Negative for chest pain, palpitations and leg swelling.       Irregular heart beats  Gastrointestinal: Negative for abdominal pain, blood in stool, nausea and vomiting.  Genitourinary: Negative for dysuria.  Musculoskeletal: Negative for myalgias.  Skin: Negative for rash.  Neurological: Negative for dizziness, tingling and tremors.  Endo/Heme/Allergies: Does not bruise/bleed easily.  Psychiatric/Behavioral: Negative for hallucinations.    MEDICAL HISTORY:  Past Medical History:  Diagnosis Date  . Anemia in chronic kidney disease (CKD) 11/14/2017  . Anxiety   . Aortic valve regurgitation   . Aortic valve replaced   . Chronic kidney disease   . Coronary artery disease   . Dizziness   . Edema   . GERD (gastroesophageal reflux disease)   . Gout   . Hypercholesterolemia   . Hypertension   . Shortness of breath dyspnea     SURGICAL HISTORY: Past Surgical History:  Procedure Laterality Date  . ABDOMINAL HYSTERECTOMY    . AORTIC VALVE REPLACEMENT    . CARDIAC CATHETERIZATION    . CARDIAC CATHETERIZATION N/A 05/07/2015   Procedure: Left Heart Cath;  Surgeon:  [name], MD;  Location: [place] [facility];  Service: Cardiovascular;  Laterality: N/A;  . CARDIAC VALVE REPLACEMENT    . COLONOSCOPY Left 02/20/2019   Procedure: COLONOSCOPY;  Surgeon: Virgel Manifold, MD;  Location: [facility] ENDOSCOPY;  Service: Endoscopy;  Laterality: Left;  . COLONOSCOPY N/A  02/21/2019   Procedure: COLONOSCOPY;  Surgeon: Virgel Manifold, MD;  Location: [facility] ENDOSCOPY;  Service: Endoscopy;  Laterality: N/A;  . ELECTROPHYSIOLOGIC STUDY N/A 05/05/2015   Procedure: Cardioversion;  Surgeon: [name], MD;  Location: [facility] ORS;  Service: Cardiovascular;  Laterality: N/A;  . ENTEROSCOPY N/A 02/21/2019   Procedure: ENTEROSCOPY;  Surgeon: Virgel Manifold, MD;  Location: [facility] ENDOSCOPY;  Service: Endoscopy;  Laterality: N/A;  . ESOPHAGOGASTRODUODENOSCOPY Left 02/19/2019   Procedure: ESOPHAGOGASTRODUODENOSCOPY (EGD);  Surgeon: Virgel Manifold, MD;  Location: [facility] ENDOSCOPY;  Service: Endoscopy;  Laterality: Left;  . REPLACEMENT TOTAL KNEE BILATERAL      SOCIAL HISTORY: Social History   Socioeconomic History  . Marital status: Married    Spouse name: Not on file  . Number of children: Not on file  . Years of education: Not on file  . Highest education level: Not on file  Occupational History  . Not on file  Social Needs  . Financial resource strain: Not on file  . Food insecurity:    Worry: Not on file    Inability: Not on file  . Transportation needs:    Medical: Not on file    Non-medical: Not on file  Tobacco Use  . Smoking status: Former Smoker    Packs/day: 0.50    Years: 20.00    Pack years: 10.00    Types: Cigarettes    Last attempt to quit: 2004    Years since quitting: 16.4  . Smokeless tobacco: Never Used  Substance and Sexual Activity  . Alcohol use: Yes    Alcohol/week: 0.0 standard drinks    Comment: rare  . Drug use: No  . Sexual activity: Not on file  Lifestyle  . Physical activity:    Days per week: Not on file    Minutes per session: Not on file  . Stress: Not on file  Relationships  . Social connections:    Talks on phone: Not on file    Gets together: Not on file    Attends religious service: Not on file    Active member of club or organization: Not on file    Attends meetings of clubs or organizations: Not on  file    Relationship status: Not on file  . Intimate partner violence:    Fear of current or ex partner: Not on file    Emotionally abused: Not on file    Physically abused: Not on file    Forced sexual activity: Not on file  Other Topics Concern  . Not on file  Social History Narrative  . Not on file    FAMILY HISTORY: Family History  Problem Relation Age of Onset  . Hypertension Mother   . Colon cancer Mother   . Prostate cancer Father   . Kidney cancer Brother   . Prostate cancer Brother   . Prostate cancer Brother   . Breast cancer Neg Hx     ALLERGIES:  is allergic to atorvastatin.  MEDICATIONS:  Current Outpatient Medications  Medication Sig Dispense Refill  . ALPRAZolam (XANAX) 0.25 MG tablet Take 0.25 mg by mouth 2 (two) times daily.    Marland Kitchen amLODipine (NORVASC) 10  MG tablet Take 5 mg by mouth daily.     Marland Kitchen amoxicillin (AMOXIL) 500 MG tablet Take 2,000 mg by mouth as needed. For dental procedures    . aspirin EC 81 MG tablet Take 81 mg by mouth daily.    . benazepril (LOTENSIN) 40 MG tablet Take 40 mg by mouth daily.    . cloNIDine (CATAPRES) 0.3 MG tablet Take 0.3 mg 3 (three) times daily by mouth.  2  . folic acid (FOLVITE) 1 MG tablet TAKE 1 TABLET(1 MG) BY MOUTH DAILY 90 tablet 4  . furosemide (LASIX) 20 MG tablet Take 20-40 mg by mouth daily. Take one tablet on odd days and two tablets on even days    . Iron-Vitamin C 65-125 MG TABS Take 1 tablet by mouth 2 (two) times a day. 60 tablet 3  . methimazole (TAPAZOLE) 5 MG tablet Take 5 mg by mouth daily.   11  . pantoprazole (PROTONIX) 20 MG tablet Take 1 tablet (20 mg total) by mouth daily. 30 tablet 2  . rosuvastatin (CRESTOR) 5 MG tablet Take 5 mg by mouth at bedtime.  2  . SODIUM BICARBONATE, ANTACID, PO Take 10 mg by mouth.    . warfarin (COUMADIN) 4 MG tablet Take 4 mg by mouth daily.    Marland Kitchen warfarin (COUMADIN) 6 MG tablet Take 6 mg by mouth.    . potassium chloride (K-DUR) 10 MEQ tablet Take 10 mEq by mouth  daily.      No current facility-administered medications for this visit.    Facility-Administered Medications Ordered in Other Visits  Medication Dose Route Frequency Provider Last Rate Last Dose  . epoetin alfa (EPOGEN) injection 60,000 Units  60,000 Units Subcutaneous Once Earlie Server, MD         PHYSICAL EXAMINATION: ECOG PERFORMANCE STATUS: 1 - Symptomatic but completely ambulatory Vitals:   05/22/19 1329  BP: (!) 151/85  Pulse: 74  Temp: (!) 97.1 F (36.2 C)   Filed Weights   05/22/19 1329  Weight: 178 lb 2 oz (80.8 kg)    Physical Exam  Constitutional: She is oriented to person, place, and time. No distress.  HENT:  Head: Normocephalic and atraumatic.  Nose: Nose normal.  Mouth/Throat: Oropharynx is clear and moist. No oropharyngeal exudate.  Eyes: Pupils are equal, round, and reactive to light. EOM are normal. Left eye exhibits no discharge. No scleral icterus.  pallor  Neck: Normal range of motion. Neck supple. No JVD present.  Cardiovascular: Normal rate and regular rhythm.  Murmur heard. Irregular beats  Pulmonary/Chest: Effort normal. No respiratory distress. She has no wheezes. She has no rales. She exhibits no tenderness.  Decreased breath sounds bilaterally.  No crackles  Abdominal: Soft. Bowel sounds are normal. She exhibits no distension and no mass. There is no abdominal tenderness. There is no rebound.  Musculoskeletal: Normal range of motion.        General: No tenderness or edema.  Lymphadenopathy:    She has no cervical adenopathy.  Neurological: She is alert and oriented to person, place, and time. No cranial nerve deficit. She exhibits normal muscle tone. Coordination normal.  Skin: Skin is warm and dry. No rash noted. She is not diaphoretic. No erythema.  Psychiatric: Mood and affect normal.     LABORATORY DATA:  I have reviewed the data as listed Lab Results  Component Value Date   WBC 4.6 05/22/2019   HGB 9.8 (L) 05/22/2019   HCT 33.0 (L)  05/22/2019   MCV  89.7 05/22/2019   PLT 173 05/22/2019   Recent Labs    06/28/18 1841  02/19/19 0526 02/20/19 0408 02/21/19 0454  NA 144   < > 144 140 139  K 4.5   < > 4.3 4.9 4.1  CL 110   < > 114* 113* 111  CO2 24   < > 18* 19* 20*  GLUCOSE 92   < > 104* 92 106*  BUN 30*   < > 68* 61* 54*  CREATININE 2.08*   < > 2.17* 2.20* 2.25*  CALCIUM 10.1   < > 9.2 9.0 8.9  GFRNONAA 24*   < > 23* 22* 22*  GFRAA 27*   < > 26* 26* 25*  PROT 7.5  --   --   --   --   ALBUMIN 3.9  --   --   --   --   AST 18  --   --   --   --   ALT 10  --   --   --   --   ALKPHOS 156*  --   --   --   --   BILITOT 0.5  --   --   --   --    < > = values in this interval not displayed.   Iron/TIBC/Ferritin/ %Sat    Component Value Date/Time   IRON 33 03/25/2019 1100   TIBC 315 03/25/2019 1100   FERRITIN 104 03/25/2019 1100   IRONPCTSAT 11 03/25/2019 1100    08/29/2017 Folate 2.8,  B12 825              01/02/2018 TSH 3.05                                                                                                                                                        RADIOGRAPHIC STUDIES: I have personally reviewed the radiological images as listed and agreed with the findings in the report. 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  Mild aneurysmal dilation of the proximal abdominal aorta 3.7 cm.  Femorofemoral crossover graft and right axillo femoral bypass graft noted.  Rounded fluid collection surrounds the distal aspect of the axillofemoral bypass graft.  Presumably postoperative seroma.  Moderate stool burden in the colon.  2 cm soft tissue nodule in the left posterior mediastinum presumably mildly enlarged lymph node.  This could be followed with a repeat CT chest in 6 months to assess stability.  Cardiomegaly.  CAD.  ASSESSMENT & PLAN:  1. Encounter for ESA (erythropoietin stimulating agent) anemia management   2. Anemia of chronic renal failure, stage 4 (severe) (HCC)   3. Abnormal CT scan    #Anemia of chronic kidney disease Hemoglobin 9.8 today.  We will continue proceed with Procrit 60,000 units.  #History of GI bleed due to chronic anticoagulation.  Iron panel done on 03/25/2019 shows ferritin of 104, with a saturation 11.  Continue oral iron supplementation with vitamin C 1 tab twice daily.   Repeat iron panel at next visit.  #History of folic acid deficiency, loud heart murmur.  Continue folic acid supplementation. ##Abnormal CT finding: Patient had CT abdomen pelvis without contrast on 06/28/2018 for evaluation of right lower abdomen swelling.  There were incidental findings of 2 cm soft tissue nodule in the left posterior mediastinum.  Questionable enlarged lymph nodes.  I have previously discussed with patient about need of repeating CT scan of chest to evaluate stability.  Has not updated me about her decision whether she wants to proceed with CT or not.  All questions were answered. The patient knows to call the clinic with any problems questions or concerns. Orders Placed This Encounter  Procedures  . Hemoglobin and Hematocrit, Blood    Standing Status:   Future    Standing Expiration Date:   05/21/2020  . Hemoglobin and Hematocrit, Blood    Standing Status:   Future    Standing Expiration Date:   05/21/2020  . CBC with Differential/Platelet    Standing Status:   Future    Standing Expiration Date:   05/21/2020  . Iron and TIBC    Standing Status:   Future    Standing Expiration Date:   05/21/2020     Return of visit: H&H /procrit in 4 weeks and 8 weeks.  CBC in 12 weeks +/-Procrit.  Earlie Server, MD, PhD 05/22/19

## 2019-06-05 ENCOUNTER — Ambulatory Visit: Payer: Medicare Other | Admitting: Gastroenterology

## 2019-06-19 ENCOUNTER — Inpatient Hospital Stay: Payer: Medicare Other | Attending: Oncology

## 2019-06-19 ENCOUNTER — Other Ambulatory Visit: Payer: Self-pay

## 2019-06-19 ENCOUNTER — Inpatient Hospital Stay: Payer: Medicare Other

## 2019-06-19 VITALS — BP 136/74 | HR 61

## 2019-06-19 DIAGNOSIS — D631 Anemia in chronic kidney disease: Secondary | ICD-10-CM | POA: Diagnosis present

## 2019-06-19 DIAGNOSIS — Z79899 Other long term (current) drug therapy: Secondary | ICD-10-CM

## 2019-06-19 DIAGNOSIS — N184 Chronic kidney disease, stage 4 (severe): Secondary | ICD-10-CM | POA: Diagnosis present

## 2019-06-19 DIAGNOSIS — D649 Anemia, unspecified: Secondary | ICD-10-CM

## 2019-06-19 LAB — HEMOGLOBIN AND HEMATOCRIT, BLOOD
HCT: 32 % — ABNORMAL LOW (ref 36.0–46.0)
Hemoglobin: 9.7 g/dL — ABNORMAL LOW (ref 12.0–15.0)

## 2019-06-19 MED ORDER — EPOETIN ALFA 20000 UNIT/ML IJ SOLN
20000.0000 [IU] | Freq: Once | INTRAMUSCULAR | Status: AC
  Administered 2019-06-19: 11:00:00 20000 [IU] via SUBCUTANEOUS

## 2019-06-19 MED ORDER — EPOETIN ALFA 40000 UNIT/ML IJ SOLN
40000.0000 [IU] | Freq: Once | INTRAMUSCULAR | Status: AC
  Administered 2019-06-19: 11:00:00 40000 [IU] via SUBCUTANEOUS

## 2019-06-19 MED ORDER — EPOETIN ALFA 20000 UNIT/ML IJ SOLN: 60000.0000 [IU] | Freq: Once | INTRAMUSCULAR | Status: DC

## 2019-07-16 ENCOUNTER — Other Ambulatory Visit: Payer: Self-pay

## 2019-07-17 ENCOUNTER — Inpatient Hospital Stay: Payer: Medicare Other | Attending: Oncology

## 2019-07-17 ENCOUNTER — Other Ambulatory Visit: Payer: Self-pay

## 2019-07-17 ENCOUNTER — Inpatient Hospital Stay: Payer: Medicare Other

## 2019-07-17 VITALS — BP 151/81

## 2019-07-17 DIAGNOSIS — N184 Chronic kidney disease, stage 4 (severe): Secondary | ICD-10-CM

## 2019-07-17 DIAGNOSIS — D631 Anemia in chronic kidney disease: Secondary | ICD-10-CM | POA: Diagnosis present

## 2019-07-17 DIAGNOSIS — Z79899 Other long term (current) drug therapy: Secondary | ICD-10-CM

## 2019-07-17 DIAGNOSIS — D649 Anemia, unspecified: Secondary | ICD-10-CM

## 2019-07-17 LAB — HEMOGLOBIN AND HEMATOCRIT, BLOOD
HCT: 33 % — ABNORMAL LOW (ref 36.0–46.0)
Hemoglobin: 9.9 g/dL — ABNORMAL LOW (ref 12.0–15.0)

## 2019-07-17 MED ORDER — EPOETIN ALFA 20000 UNIT/ML IJ SOLN: 60000.0000 [IU] | Freq: Once | INTRAMUSCULAR | Status: DC

## 2019-07-17 MED ORDER — EPOETIN ALFA 20000 UNIT/ML IJ SOLN
20000.0000 [IU] | Freq: Once | INTRAMUSCULAR | Status: AC
  Administered 2019-07-17: 20000 [IU] via SUBCUTANEOUS
  Filled 2019-07-17: qty 1

## 2019-07-17 MED ORDER — EPOETIN ALFA 40000 UNIT/ML IJ SOLN
40000.0000 [IU] | Freq: Once | INTRAMUSCULAR | Status: AC
  Administered 2019-07-17: 40000 [IU] via SUBCUTANEOUS
  Filled 2019-07-17: qty 1

## 2019-08-21 ENCOUNTER — Other Ambulatory Visit: Payer: Self-pay

## 2019-08-21 ENCOUNTER — Encounter: Payer: Self-pay | Admitting: Oncology

## 2019-08-21 NOTE — Progress Notes (Signed)
Patient pre screened today no complaints.

## 2019-08-22 ENCOUNTER — Encounter: Payer: Self-pay | Admitting: Oncology

## 2019-08-22 ENCOUNTER — Inpatient Hospital Stay (HOSPITAL_BASED_OUTPATIENT_CLINIC_OR_DEPARTMENT_OTHER): Payer: Medicare Other | Admitting: Oncology

## 2019-08-22 ENCOUNTER — Inpatient Hospital Stay: Payer: Medicare Other | Attending: Oncology

## 2019-08-22 ENCOUNTER — Inpatient Hospital Stay: Payer: Medicare Other

## 2019-08-22 ENCOUNTER — Other Ambulatory Visit: Payer: Self-pay

## 2019-08-22 VITALS — BP 139/82 | HR 44 | Temp 96.3°F | Resp 18 | Wt 180.9 lb

## 2019-08-22 DIAGNOSIS — D631 Anemia in chronic kidney disease: Secondary | ICD-10-CM

## 2019-08-22 DIAGNOSIS — D649 Anemia, unspecified: Secondary | ICD-10-CM

## 2019-08-22 DIAGNOSIS — D509 Iron deficiency anemia, unspecified: Secondary | ICD-10-CM | POA: Diagnosis not present

## 2019-08-22 DIAGNOSIS — N189 Chronic kidney disease, unspecified: Secondary | ICD-10-CM | POA: Insufficient documentation

## 2019-08-22 DIAGNOSIS — Z79899 Other long term (current) drug therapy: Secondary | ICD-10-CM

## 2019-08-22 DIAGNOSIS — R9389 Abnormal findings on diagnostic imaging of other specified body structures: Secondary | ICD-10-CM

## 2019-08-22 DIAGNOSIS — N184 Chronic kidney disease, stage 4 (severe): Secondary | ICD-10-CM

## 2019-08-22 LAB — CBC WITH DIFFERENTIAL/PLATELET
Abs Immature Granulocytes: 0.01 10*3/uL (ref 0.00–0.07)
Basophils Absolute: 0 10*3/uL (ref 0.0–0.1)
Basophils Relative: 1 %
Eosinophils Absolute: 0.1 10*3/uL (ref 0.0–0.5)
Eosinophils Relative: 3 %
HCT: 30.9 % — ABNORMAL LOW (ref 36.0–46.0)
Hemoglobin: 9.4 g/dL — ABNORMAL LOW (ref 12.0–15.0)
Immature Granulocytes: 0 %
Lymphocytes Relative: 21 %
Lymphs Abs: 0.9 10*3/uL (ref 0.7–4.0)
MCH: 28 pg (ref 26.0–34.0)
MCHC: 30.4 g/dL (ref 30.0–36.0)
MCV: 92 fL (ref 80.0–100.0)
Monocytes Absolute: 0.5 10*3/uL (ref 0.1–1.0)
Monocytes Relative: 11 %
Neutro Abs: 2.9 10*3/uL (ref 1.7–7.7)
Neutrophils Relative %: 64 %
Platelets: 157 10*3/uL (ref 150–400)
RBC: 3.36 MIL/uL — ABNORMAL LOW (ref 3.87–5.11)
RDW: 14.9 % (ref 11.5–15.5)
WBC: 4.5 10*3/uL (ref 4.0–10.5)
nRBC: 0 % (ref 0.0–0.2)

## 2019-08-22 LAB — IRON AND TIBC
Iron: 71 ug/dL (ref 28–170)
Saturation Ratios: 23 % (ref 10.4–31.8)
TIBC: 309 ug/dL (ref 250–450)
UIBC: 238 ug/dL

## 2019-08-22 LAB — FERRITIN: Ferritin: 182 ng/mL (ref 11–307)

## 2019-08-22 MED ORDER — EPOETIN ALFA 20000 UNIT/ML IJ SOLN: 60000.0000 [IU] | Freq: Once | INTRAMUSCULAR | Status: DC

## 2019-08-22 MED ORDER — EPOETIN ALFA 40000 UNIT/ML IJ SOLN
40000.0000 [IU] | Freq: Once | INTRAMUSCULAR | Status: AC
  Administered 2019-08-22: 40000 [IU] via SUBCUTANEOUS
  Filled 2019-08-22: qty 1

## 2019-08-22 MED ORDER — EPOETIN ALFA 20000 UNIT/ML IJ SOLN
20000.0000 [IU] | Freq: Once | INTRAMUSCULAR | Status: AC
  Administered 2019-08-22: 20000 [IU] via SUBCUTANEOUS
  Filled 2019-08-22: qty 1

## 2019-08-22 NOTE — Progress Notes (Signed)
Hematology/Oncology Follow up note [facility] [phone] [phone]   Patient Care Team: Maryland Pink, MD as PCP - General (Family Medicine)  REFERRING PROVIDER: Maryland Pink, MD  REASON FOR VISIT Follow up for treatment of anemia of chronic kidney disease.  HISTORY OF PRESENTING ILLNESS:  Jeanette Romero is a  68 y.o.  female with PMH listed below who was referred to me for evaluation of labile INR. She has extensive cardiac comorbidities including history of aortic mechanic valve replacement, aneurysm of aorta, PAF, Coronary artery disease. She was on Amiodarone and also had drug induced hyperthyroidism.  She was sent to me for evaluation of labile INR.  Extensive review of her medical records from primary care physician's office showed  10/27/2017 INR 5.9,  10/24/2017 INR 4.9 10/12/2017 INR 1.2 10/07/2017 INR 6  10/04/2017 INR 8.1   08/11/2017 INR 7.5  05/24/2017 INR 3.4 04/26/2017 INR 3.5 03/15/2017 INR 3.4 01/31/2017 INR 2.7  08/29/2017 Folate 2.8,  B12 825 and the patient was started on folic acid supplementation.  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 Amiodarone was stopped. And patient was referred to endocrinologist for evaluation. She was found out to have Graves' disease and was started on prednisone. Last TSH that was done in the [facility] system showed a TSH of 4.8.  # 3/9-3/13/2020  hospitalized due to weakness and noted to have acute on chronic anemia, black tarry stool. Status post EGD showing minimal gastritis and possible Barrett esophagus.  Biopsy was not done due to elevated INR and recent bleed.  Colonoscopy showing 3 to 4 mm sized sessile polyps.  Biopsies were not done.  Patient received PRBC transfusions during her  admission   INTERVAL HISTORY 68 y.o. female  presents for follow-up for management of anemia of CKD.  Patient reported feeling okay today.  Energy is at baseline. Mood is good.  Denies any chest pain, shortness of breath, abdominal pain today.  Denies any fever or chills. no new complaints .  She is on anticoagulation, no bleeding events.  Review of Systems  Constitutional: Positive for malaise/fatigue. Negative for chills, fever and weight loss.  HENT: Negative for sore throat.   Eyes: Negative for redness.  Respiratory: Negative for cough, shortness of breath and wheezing.   Cardiovascular: Negative for chest pain, palpitations and leg swelling.       Irregular heart beats  Gastrointestinal: Negative for abdominal pain, blood in stool, nausea and vomiting.  Genitourinary: Negative for dysuria.  Musculoskeletal: Negative for myalgias.  Skin: Negative for rash.  Neurological: Negative for dizziness, tingling and tremors.  Endo/Heme/Allergies: Does not bruise/bleed easily.  Psychiatric/Behavioral: Negative for hallucinations.    MEDICAL HISTORY:  Past Medical History:  Diagnosis Date   Anemia in chronic kidney disease (CKD) 11/14/2017   Anxiety    Aortic valve regurgitation    Aortic valve replaced    Chronic kidney disease    Coronary artery disease    Dizziness    Edema    GERD (gastroesophageal reflux disease)    Gout    Hypercholesterolemia    Hypertension    Shortness of breath dyspnea     SURGICAL HISTORY: Past Surgical History:  Procedure Laterality Date   ABDOMINAL HYSTERECTOMY     AORTIC VALVE REPLACEMENT     CARDIAC CATHETERIZATION  CARDIAC CATHETERIZATION N/A 05/07/2015   Procedure: Left Heart Cath;  Surgeon: [name], MD;  Location: [facility];  Service: Cardiovascular;  Laterality: N/A;   CARDIAC VALVE REPLACEMENT     COLONOSCOPY Left 02/20/2019   Procedure: COLONOSCOPY;  Surgeon: Virgel Manifold, MD;  Location:  [facility] ENDOSCOPY;  Service: Endoscopy;  Laterality: Left;   COLONOSCOPY N/A 02/21/2019   Procedure: COLONOSCOPY;  Surgeon: Virgel Manifold, MD;  Location: [facility] ENDOSCOPY;  Service: Endoscopy;  Laterality: N/A;   ELECTROPHYSIOLOGIC STUDY N/A 05/05/2015   Procedure: Cardioversion;  Surgeon: [name], MD;  Location: [facility] ORS;  Service: Cardiovascular;  Laterality: N/A;   ENTEROSCOPY N/A 02/21/2019   Procedure: ENTEROSCOPY;  Surgeon: Virgel Manifold, MD;  Location: [facility] ENDOSCOPY;  Service: Endoscopy;  Laterality: N/A;   ESOPHAGOGASTRODUODENOSCOPY Left 02/19/2019   Procedure: ESOPHAGOGASTRODUODENOSCOPY (EGD);  Surgeon: Virgel Manifold, MD;  Location: [facility] ENDOSCOPY;  Service: Endoscopy;  Laterality: Left;   REPLACEMENT TOTAL KNEE BILATERAL      SOCIAL HISTORY: Social History   Socioeconomic History   Marital status: Married    Spouse name: Not on file   Number of children: Not on file   Years of education: Not on file   Highest education level: Not on file  Occupational History   Not on file  Social Needs   Financial resource strain: Not on file   Food insecurity    Worry: Not on file    Inability: Not on file   Transportation needs    Medical: Not on file    Non-medical: Not on file  Tobacco Use   Smoking status: Former Smoker    Packs/day: 0.50    Years: 20.00    Pack years: 10.00    Types: Cigarettes    Quit date: 2004    Years since quitting: 16.7   Smokeless tobacco: Never Used  Substance and Sexual Activity   Alcohol use: Yes    Alcohol/week: 0.0 standard drinks    Comment: rare   Drug use: No   Sexual activity: Not on file  Lifestyle   Physical activity    Days per week: Not on file    Minutes per session: Not on file   Stress: Not on file  Relationships   Social connections    Talks on phone: Not on file    Gets together: Not on file    Attends religious service: Not on file    Active member of club or organization: Not  on file    Attends meetings of clubs or organizations: Not on file    Relationship status: Not on file   Intimate partner violence    Fear of current or ex partner: Not on file    Emotionally abused: Not on file    Physically abused: Not on file    Forced sexual activity: Not on file  Other Topics Concern   Not on file  Social History Narrative   Not on file    FAMILY HISTORY: Family History  Problem Relation Age of Onset   Hypertension Mother    Colon cancer Mother    Prostate cancer Father    Kidney cancer Brother    Prostate cancer Brother    Prostate cancer Brother    Breast cancer Neg Hx     ALLERGIES:  is allergic to atorvastatin.  MEDICATIONS:  Current Outpatient Medications  Medication Sig Dispense Refill   ALPRAZolam (XANAX) 0.25 MG tablet Take 0.25 mg by mouth 2 (  two) times daily.     amLODipine (NORVASC) 10 MG tablet Take 5 mg by mouth daily.      amoxicillin (AMOXIL) 500 MG tablet Take 2,000 mg by mouth as needed. For dental procedures     aspirin EC 81 MG tablet Take 81 mg by mouth daily.     benazepril (LOTENSIN) 40 MG tablet Take 40 mg by mouth daily.     cloNIDine (CATAPRES) 0.3 MG tablet Take 0.3 mg 3 (three) times daily by mouth.  2   folic acid (FOLVITE) 1 MG tablet TAKE 1 TABLET(1 MG) BY MOUTH DAILY 90 tablet 4   furosemide (LASIX) 20 MG tablet Take 20-40 mg by mouth daily. Take one tablet on odd days and two tablets on even days     Iron-Vitamin C 65-125 MG TABS Take 1 tablet by mouth 2 (two) times a day. 60 tablet 3   methimazole (TAPAZOLE) 5 MG tablet Take 5 mg by mouth daily.   11   pantoprazole (PROTONIX) 20 MG tablet Take 1 tablet (20 mg total) by mouth daily. 30 tablet 2   potassium chloride (K-DUR) 10 MEQ tablet Take 10 mEq by mouth daily.      rosuvastatin (CRESTOR) 5 MG tablet Take 5 mg by mouth at bedtime.  2   SODIUM BICARBONATE, ANTACID, PO Take 10 mg by mouth.     warfarin (COUMADIN) 4 MG tablet Take 4 mg by mouth  daily.     warfarin (COUMADIN) 6 MG tablet Take 6 mg by mouth.     No current facility-administered medications for this visit.      PHYSICAL EXAMINATION: ECOG PERFORMANCE STATUS: 1 - Symptomatic but completely ambulatory Vitals:   08/22/19 1012  BP: 139/82  Pulse: (!) 44  Resp: 18  Temp: (!) 96.3 F (35.7 C)   Filed Weights   08/22/19 1012  Weight: 180 lb 14.4 oz (82.1 kg)    Physical Exam  Constitutional: She is oriented to person, place, and time. No distress.  HENT:  Head: Normocephalic and atraumatic.  Nose: Nose normal.  Mouth/Throat: Oropharynx is clear and moist. No oropharyngeal exudate.  Eyes: Pupils are equal, round, and reactive to light. EOM are normal. Left eye exhibits no discharge. No scleral icterus.  pallor  Neck: Normal range of motion. Neck supple. No JVD present.  Cardiovascular: Normal rate and regular rhythm.  Murmur heard. Irregular beats  Pulmonary/Chest: Effort normal. No respiratory distress. She has no wheezes. She has no rales. She exhibits no tenderness.  Decreased breath sounds bilaterally.  No crackles  Abdominal: Soft. Bowel sounds are normal. She exhibits no distension and no mass. There is no abdominal tenderness. There is no rebound.  Musculoskeletal: Normal range of motion.        General: No tenderness or edema.  Lymphadenopathy:    She has no cervical adenopathy.  Neurological: She is alert and oriented to person, place, and time. No cranial nerve deficit. She exhibits normal muscle tone. Coordination normal.  Skin: Skin is warm and dry. No rash noted. She is not diaphoretic. No erythema.  Psychiatric: Mood and affect normal.     LABORATORY DATA:  I have reviewed the data as listed Lab Results  Component Value Date   WBC 4.5 08/22/2019   HGB 9.4 (L) 08/22/2019   HCT 30.9 (L) 08/22/2019   MCV 92.0 08/22/2019   PLT 157 08/22/2019   Recent Labs    02/19/19 0526 02/20/19 0408 02/21/19 0454  NA 144 140 139  K 4.3 4.9  4.1  CL 114* 113* 111  CO2 18* 19* 20*  GLUCOSE 104* 92 106*  BUN 68* 61* 54*  CREATININE 2.17* 2.20* 2.25*  CALCIUM 9.2 9.0 8.9  GFRNONAA 23* 22* 22*  GFRAA 26* 26* 25*   Iron/TIBC/Ferritin/ %Sat    Component Value Date/Time   IRON 71 08/22/2019 0950   TIBC 309 08/22/2019 0950   FERRITIN 182 08/22/2019 0950   IRONPCTSAT 23 08/22/2019 0950    08/29/2017 Folate 2.8,  B12 825              01/02/2018 TSH 3.05                                                                                                                                                        RADIOGRAPHIC STUDIES: I have personally reviewed the radiological images as listed and agreed with the findings in the report. 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  Mild aneurysmal dilation of the proximal abdominal aorta 3.7 cm.  Femorofemoral crossover graft and right axillo femoral bypass graft noted.  Rounded fluid collection surrounds the distal aspect of the axillofemoral bypass graft.  Presumably postoperative seroma.  Moderate stool burden in the colon.  2 cm soft tissue nodule in the left posterior mediastinum presumably mildly enlarged lymph node.  This could be followed with a repeat CT chest in 6 months to assess stability.  Cardiomegaly.  CAD.  ASSESSMENT & PLAN:  1. Anemia of chronic renal failure, stage 4 (severe) ([place])   2. Encounter for ESA (erythropoietin stimulating agent) anemia management   3. Abnormal CT scan   4. Iron deficiency anemia,  unspecified iron deficiency anemia type    #Anemia of chronic kidney disease Labs are reviewed and discussed with patient. Hemoglobin 9.4 today.  Proceed with Procrit 60,000 units.  #History of GI bleeding due to chronic anticoagulation. Iron panel was reviewed.  Ferritin 182, saturation ratio 23, TIBC 309. Patient reports that she is not taking oral iron supplementation because of GI side effects. Continue monitor.   #History of folic acid deficiency, loud heart murmur.  Continue folic acid supplementation. ##Abnormal CT finding: Patient had CT abdomen pelvis without contrast on 06/28/2018 for evaluation of right lower abdomen swelling.  There were incidental findings of 2 cm soft tissue nodule in the left posterior mediastinum.  Questionable enlarged lymph nodes.  I have previously discussed with patient about need of repeating CT scan of chest to evaluate stability.  Has not updated me about her decision whether she wants to proceed with CT or not.  All questions were answered. The patient knows to call the clinic with any problems questions or concerns. Orders Placed This Encounter  Procedures   Ferritin    Standing Status:   Future    Number of Occurrences:   1    Standing Expiration Date:   08/21/2020   Hemoglobin    Standing Status:   Standing    Number of Occurrences:   2    Standing Expiration Date:   08/21/2020   Hematocrit    Standing Status:   Standing    Number of Occurrences:   2    Standing Expiration Date:   08/21/2020   CBC with Differential/Platelet    Standing Status:   Future    Standing Expiration Date:   08/21/2020   Iron and TIBC    Standing Status:   Future    Standing Expiration Date:   08/21/2020   Ferritin    Standing Status:   Future    Standing Expiration Date:   08/21/2020    Return of visit: H&H /procrit in 4 weeks and 8 weeks.  CBC in 12 weeks +/-Procrit.  Earlie Server, MD, PhD 08/22/19

## 2019-09-19 ENCOUNTER — Inpatient Hospital Stay: Payer: Medicare Other | Attending: Oncology

## 2019-09-19 ENCOUNTER — Other Ambulatory Visit: Payer: Self-pay

## 2019-09-19 ENCOUNTER — Inpatient Hospital Stay: Payer: Medicare Other

## 2019-09-19 VITALS — BP 137/79 | HR 73

## 2019-09-19 DIAGNOSIS — D631 Anemia in chronic kidney disease: Secondary | ICD-10-CM

## 2019-09-19 DIAGNOSIS — N184 Chronic kidney disease, stage 4 (severe): Secondary | ICD-10-CM | POA: Diagnosis present

## 2019-09-19 LAB — HEMOGLOBIN: Hemoglobin: 9.1 g/dL — ABNORMAL LOW (ref 12.0–15.0)

## 2019-09-19 LAB — HEMATOCRIT: HCT: 30.6 % — ABNORMAL LOW (ref 36.0–46.0)

## 2019-09-19 MED ORDER — EPOETIN ALFA 20000 UNIT/ML IJ SOLN
60000.0000 [IU] | Freq: Once | INTRAMUSCULAR | Status: AC
  Administered 2019-09-19: 11:00:00 60000 [IU] via SUBCUTANEOUS
  Filled 2019-09-19: qty 3

## 2019-10-04 ENCOUNTER — Other Ambulatory Visit: Payer: Self-pay

## 2019-10-04 ENCOUNTER — Emergency Department
Admission: EM | Admit: 2019-10-04 | Discharge: 2019-10-04 | Disposition: A | Discharge status: Home / Self Care | Payer: Medicare Other | Attending: Emergency Medicine | Admitting: Emergency Medicine

## 2019-10-04 ENCOUNTER — Encounter: Payer: Self-pay | Admitting: Emergency Medicine

## 2019-10-04 ENCOUNTER — Emergency Department: Payer: Medicare Other

## 2019-10-04 DIAGNOSIS — Z87891 Personal history of nicotine dependence: Secondary | ICD-10-CM | POA: Insufficient documentation

## 2019-10-04 DIAGNOSIS — N189 Chronic kidney disease, unspecified: Secondary | ICD-10-CM | POA: Diagnosis not present

## 2019-10-04 DIAGNOSIS — Z7982 Long term (current) use of aspirin: Secondary | ICD-10-CM | POA: Insufficient documentation

## 2019-10-04 DIAGNOSIS — Z85038 Personal history of other malignant neoplasm of large intestine: Secondary | ICD-10-CM | POA: Diagnosis not present

## 2019-10-04 DIAGNOSIS — E039 Hypothyroidism, unspecified: Secondary | ICD-10-CM | POA: Diagnosis not present

## 2019-10-04 DIAGNOSIS — I129 Hypertensive chronic kidney disease with stage 1 through stage 4 chronic kidney disease, or unspecified chronic kidney disease: Secondary | ICD-10-CM | POA: Diagnosis not present

## 2019-10-04 DIAGNOSIS — Z7901 Long term (current) use of anticoagulants: Secondary | ICD-10-CM | POA: Diagnosis not present

## 2019-10-04 DIAGNOSIS — Z79899 Other long term (current) drug therapy: Secondary | ICD-10-CM | POA: Diagnosis not present

## 2019-10-04 DIAGNOSIS — H469 Unspecified optic neuritis: Secondary | ICD-10-CM | POA: Diagnosis not present

## 2019-10-04 DIAGNOSIS — H547 Unspecified visual loss: Secondary | ICD-10-CM | POA: Diagnosis present

## 2019-10-04 LAB — CBC WITH DIFFERENTIAL/PLATELET
Abs Immature Granulocytes: 0.01 10*3/uL (ref 0.00–0.07)
Basophils Absolute: 0 10*3/uL (ref 0.0–0.1)
Basophils Relative: 1 %
Eosinophils Absolute: 0.1 10*3/uL (ref 0.0–0.5)
Eosinophils Relative: 3 %
HCT: 35.9 % — ABNORMAL LOW (ref 36.0–46.0)
Hemoglobin: 10.6 g/dL — ABNORMAL LOW (ref 12.0–15.0)
Immature Granulocytes: 0 %
Lymphocytes Relative: 18 %
Lymphs Abs: 0.9 10*3/uL (ref 0.7–4.0)
MCH: 27.9 pg (ref 26.0–34.0)
MCHC: 29.5 g/dL — ABNORMAL LOW (ref 30.0–36.0)
MCV: 94.5 fL (ref 80.0–100.0)
Monocytes Absolute: 0.5 10*3/uL (ref 0.1–1.0)
Monocytes Relative: 10 %
Neutro Abs: 3.2 10*3/uL (ref 1.7–7.7)
Neutrophils Relative %: 68 %
Platelets: 179 10*3/uL (ref 150–400)
RBC: 3.8 MIL/uL — ABNORMAL LOW (ref 3.87–5.11)
RDW: 15.6 % — ABNORMAL HIGH (ref 11.5–15.5)
WBC: 4.7 10*3/uL (ref 4.0–10.5)
nRBC: 0 % (ref 0.0–0.2)

## 2019-10-04 LAB — BASIC METABOLIC PANEL
Anion gap: 10 (ref 5–15)
BUN: 66 mg/dL — ABNORMAL HIGH (ref 8–23)
CO2: 24 mmol/L (ref 22–32)
Calcium: 10.2 mg/dL (ref 8.9–10.3)
Chloride: 107 mmol/L (ref 98–111)
Creatinine, Ser: 2.42 mg/dL — ABNORMAL HIGH (ref 0.44–1.00)
GFR calc Af Amer: 23 mL/min — ABNORMAL LOW (ref >60)
GFR calc non Af Amer: 20 mL/min — ABNORMAL LOW (ref >60)
Glucose, Bld: 98 mg/dL (ref 70–99)
Potassium: 4.6 mmol/L (ref 3.5–5.1)
Sodium: 141 mmol/L (ref 135–145)

## 2019-10-04 LAB — C-REACTIVE PROTEIN: CRP: <0.8 mg/dL (ref <1.0)

## 2019-10-04 LAB — PROTIME-INR
INR: 2.6 — ABNORMAL HIGH (ref 0.8–1.2)
Prothrombin Time: 27.6 seconds — ABNORMAL HIGH (ref 11.4–15.2)

## 2019-10-04 LAB — SEDIMENTATION RATE: Sed Rate: 45 mm/hr — ABNORMAL HIGH (ref 0–30)

## 2019-10-04 NOTE — ED Triage Notes (Signed)
Pt to ED via POV, pt states that this morning around 0930 she noticed decreased vision in her right eye. Pt states that she is not able to see out of the upper half of her right eye. Pt went to see Dr. Ellin Mayhew and ws referred to ED for possible optic nerve ischemia. Pt denies any other symptoms at this time. Pt denies headache.

## 2019-10-04 NOTE — ED Provider Notes (Addendum)
[facility] Emergency Department Provider Note   ____________________________________________    I have reviewed the triage vital signs and the nursing notes.   HISTORY  Chief Complaint Loss of Vision     HPI Jeanette Romero is a 68 y.o. female with a history of chronic kidney disease, coronary artery disease, hypertension sent by optometry for evaluation of decreased superior visual field defect on the right eye..  Patient reports this started at approximately 9 AM this morning so she went to the optometrist to have her eyes checked.  She has never had this before.  Denies headache.  No neuro deficits.  No nausea or vomiting.  Past Medical History:  Diagnosis Date  . Anemia in chronic kidney disease (CKD) 11/14/2017  . Anxiety   . Aortic valve regurgitation   . Aortic valve replaced   . Chronic kidney disease   . Coronary artery disease   . Dizziness   . Edema   . GERD (gastroesophageal reflux disease)   . Gout   . Hypercholesterolemia   . Hypertension   . Shortness of breath dyspnea     Patient Active Problem List   Diagnosis Date Noted  . Benign neoplasm of ascending colon   . Benign neoplasm of cecum   . Internal hemorrhoids   . Elevated troponin   . Columnar-lined esophagus   . Melena   . Acute posthemorrhagic anemia   . Demand ischemia of myocardium ([place])   . Symptomatic anemia 02/17/2019  . Anemia of chronic renal failure 11/14/2017  . Normocytic anemia 10/31/2017  . Fatigue associated with anemia 10/31/2017  . Depression 10/31/2017  . Hyperthyroidism 10/31/2017  . Chronic anticoagulation 10/31/2017  . Bradycardia 05/05/2015    Past Surgical History:  Procedure Laterality Date  . ABDOMINAL HYSTERECTOMY    . AORTIC VALVE REPLACEMENT    . CARDIAC CATHETERIZATION    . CARDIAC CATHETERIZATION N/A 05/07/2015   Procedure: Left Heart Cath;  Surgeon: [name], MD;  Location: [facility];  Service:  Cardiovascular;  Laterality: N/A;  . CARDIAC VALVE REPLACEMENT    . COLONOSCOPY Left 02/20/2019   Procedure: COLONOSCOPY;  Surgeon: Virgel Manifold, MD;  Location: [facility] ENDOSCOPY;  Service: Endoscopy;  Laterality: Left;  . COLONOSCOPY N/A 02/21/2019   Procedure: COLONOSCOPY;  Surgeon: Virgel Manifold, MD;  Location: [facility] ENDOSCOPY;  Service: Endoscopy;  Laterality: N/A;  . ELECTROPHYSIOLOGIC STUDY N/A 05/05/2015   Procedure: Cardioversion;  Surgeon: [name], MD;  Location: [facility] ORS;  Service: Cardiovascular;  Laterality: N/A;  . ENTEROSCOPY N/A 02/21/2019   Procedure: ENTEROSCOPY;  Surgeon: Virgel Manifold, MD;  Location: [facility] ENDOSCOPY;  Service: Endoscopy;  Laterality: N/A;  . ESOPHAGOGASTRODUODENOSCOPY Left 02/19/2019   Procedure: ESOPHAGOGASTRODUODENOSCOPY (EGD);  Surgeon: Virgel Manifold, MD;  Location: [facility] ENDOSCOPY;  Service: Endoscopy;  Laterality: Left;  . REPLACEMENT TOTAL KNEE BILATERAL      Prior to Admission medications   Medication Sig Start Date End Date Taking? Authorizing Provider  ALPRAZolam (XANAX) 0.25 MG tablet Take 0.25 mg by mouth 2 (two) times daily.    [provider]  amLODipine (NORVASC) 10 MG tablet Take 5 mg by mouth daily.     [provider]  amoxicillin (AMOXIL) 500 MG tablet Take 2,000 mg by mouth as needed. For dental procedures 01/17/19   [provider]  aspirin EC 81 MG tablet Take 81 mg by mouth daily.    [provider]  benazepril (LOTENSIN) 40  MG tablet Take 40 mg by mouth daily.    [provider]  cloNIDine (CATAPRES) 0.3 MG tablet Take 0.3 mg 3 (three) times daily by mouth. 10/14/17   [provider]  folic acid (FOLVITE) 1 MG tablet TAKE 1 TABLET(1 MG) BY MOUTH DAILY 01/02/19   Earlie Server, MD  furosemide (LASIX) 20 MG tablet Take 20-40 mg by mouth daily. Take one tablet on odd days and two tablets on even days 12/14/18   [provider]  Iron-Vitamin C 65-125 MG TABS  Take 1 tablet by mouth 2 (two) times a day. 04/24/19   Earlie Server, MD  methimazole (TAPAZOLE) 5 MG tablet Take 5 mg by mouth daily.  07/06/18   [provider]  pantoprazole (PROTONIX) 20 MG tablet Take 1 tablet (20 mg total) by mouth daily. 02/23/19   Gladstone Lighter, MD  potassium chloride (K-DUR) 10 MEQ tablet Take 10 mEq by mouth daily.  05/04/18 08/21/19  [provider]  rosuvastatin (CRESTOR) 5 MG tablet Take 5 mg by mouth at bedtime. 02/20/18   [provider]  SODIUM BICARBONATE, ANTACID, PO Take 10 mg by mouth.    [provider]  warfarin (COUMADIN) 4 MG tablet Take 4 mg by mouth daily. 01/13/19   [provider]  warfarin (COUMADIN) 6 MG tablet Take 6 mg by mouth. 05/21/19   [provider]     Allergies Atorvastatin  Family History  Problem Relation Age of Onset  . Hypertension Mother   . Colon cancer Mother   . Prostate cancer Father   . Kidney cancer Brother   . Prostate cancer Brother   . Prostate cancer Brother   . Breast cancer Neg Hx     Social History Social History   Tobacco Use  . Smoking status: Former Smoker    Packs/day: 0.50    Years: 20.00    Pack years: 10.00    Types: Cigarettes    Quit date: 2004    Years since quitting: 16.8  . Smokeless tobacco: Never Used  Substance Use Topics  . Alcohol use: Yes    Alcohol/week: 0.0 standard drinks    Comment: rare  . Drug use: No    Review of Systems  Constitutional: No fever/chills Eyes: As above ENT: No neck pain Cardiovascular: Denies chest pain.  No palpitations Respiratory: Denies shortness of breath. Gastrointestinal: No abdominal pain.  No nausea, no vomiting.   Genitourinary: Negative for dysuria. Musculoskeletal: Negative for back pain. Skin: Negative for rash. Neurological: No headache or weakness   ____________________________________________   PHYSICAL EXAM:  VITAL SIGNS: ED Triage Vitals [10/04/19 1307]  Enc Vitals Group     BP  (!) 155/95     Pulse Rate 71     Resp 16     Temp 97.7 F (36.5 C)     Temp Source Oral     SpO2 98 %     Weight 81.6 kg (180 lb)     Height 1.575 m ('5\' 2"' )     Head Circumference      Peak Flow      Pain Score 0     Pain Loc      Pain Edu?      Excl. in [place]?     Constitutional: Alert and oriented.  Eyes: Conjunctivae are normal.  PERRLA, EOMI, visual field defect superiorly right eye only, 20/60 right eye, 20/20 left eye  Nose: No congestion/rhinnorhea. Mouth/Throat: Mucous membranes are moist.   Neck:  Painless ROM Cardiovascular: Normal rate, regular. Grossly normal heart sounds.  Good peripheral circulation. Respiratory: Normal respiratory effort.  No retractions   Musculoskeletal:   Warm and well perfused Neurologic:  Normal speech and language. No gross focal neurologic deficits are appreciated.  Skin:  Skin is warm, dry and intact. No rash noted. Psychiatric: Mood and affect are normal. Speech and behavior are normal.  ____________________________________________   LABS (all labs ordered are listed, but only abnormal results are displayed)  Labs Reviewed  CBC WITH DIFFERENTIAL/PLATELET - Abnormal; Notable for the following components:      Result Value   RBC 3.80 (*)    Hemoglobin 10.6 (*)    HCT 35.9 (*)    MCHC 29.5 (*)    RDW 15.6 (*)    All other components within normal limits  BASIC METABOLIC PANEL - Abnormal; Notable for the following components:   BUN 66 (*)    Creatinine, Ser 2.42 (*)    GFR calc non Af Amer 20 (*)    GFR calc Af Amer 23 (*)    All other components within normal limits  PROTIME-INR - Abnormal; Notable for the following components:   Prothrombin Time 27.6 (*)    INR 2.6 (*)    All other components within normal limits  SEDIMENTATION RATE - Abnormal; Notable for the following components:   Sed Rate 45 (*)    All other components within normal limits  C-REACTIVE PROTEIN   ____________________________________________  EKG ED  ECG REPORT I, Lavonia Drafts, the attending physician, personally viewed and interpreted this ECG.  Date: 10/14/2019  Rhythm: atrial fibrillation QRS Axis: normal Intervals: lbbb ST/T Wave abnormalities: normal Narrative Interpretation: no evidence of acute ischemia   ____________________________________________  RADIOLOGY  CT head small old lacunar infarcts, no acute findings ____________________________________________   PROCEDURES  Procedure(s) performed: No  Procedures   Critical Care performed: No ____________________________________________   INITIAL IMPRESSION / ASSESSMENT AND PLAN / ED COURSE  Pertinent labs & imaging results that were available during my care of the patient were reviewed by me and considered in my medical decision making (see chart for details).  Patient sent for evaluation of superior visual field defect right eye only and rule out GCA.  Pending ESR, CT head unremarkable.  Concerning for optic neuropathy, will discuss with ophthalmology  ESR is 45.  Discussed with Dr. George Ina who asked that CRP is sent but no further work-up in the emergency department at this time, no Doppler is necessary, he will see the patient in his office.  Discussed with patient she agrees with this plan    ____________________________________________   FINAL CLINICAL IMPRESSION(S) / ED DIAGNOSES  Final diagnoses:  Optic neuropathy        Note:  This document was prepared using Dragon voice recognition software and may include unintentional dictation errors.   Lavonia Drafts, MD 10/04/19 1458    Lavonia Drafts, MD 10/14/19 [phone]

## 2019-10-04 NOTE — ED Notes (Signed)
Spoke with Dr. Charna Archer regarding pt symptoms. Verbal order given for EKG, CBC with diff, BMP, ESR, CPR, PT/INR, and Head Ct without contrast.

## 2019-10-16 ENCOUNTER — Other Ambulatory Visit: Payer: Self-pay

## 2019-10-16 ENCOUNTER — Other Ambulatory Visit: Payer: Self-pay | Admitting: Oncology

## 2019-10-17 ENCOUNTER — Other Ambulatory Visit: Payer: Self-pay

## 2019-10-17 ENCOUNTER — Inpatient Hospital Stay: Payer: Medicare Other

## 2019-10-17 ENCOUNTER — Inpatient Hospital Stay: Payer: Medicare Other | Attending: Oncology

## 2019-10-17 VITALS — BP 120/71

## 2019-10-17 DIAGNOSIS — D631 Anemia in chronic kidney disease: Secondary | ICD-10-CM

## 2019-10-17 DIAGNOSIS — N184 Chronic kidney disease, stage 4 (severe): Secondary | ICD-10-CM | POA: Insufficient documentation

## 2019-10-17 LAB — HEMATOCRIT: HCT: 30.3 % — ABNORMAL LOW (ref 36.0–46.0)

## 2019-10-17 LAB — HEMOGLOBIN: Hemoglobin: 9.1 g/dL — ABNORMAL LOW (ref 12.0–15.0)

## 2019-10-17 MED ORDER — EPOETIN ALFA-EPBX 40000 UNIT/ML IJ SOLN
40000.0000 [IU] | Freq: Once | INTRAMUSCULAR | Status: AC
  Administered 2019-10-17: 40000 [IU] via SUBCUTANEOUS
  Filled 2019-10-17: qty 1

## 2019-10-17 MED ORDER — EPOETIN ALFA-EPBX 40000 UNIT/ML IJ SOLN: 60000.0000 [IU] | Freq: Once | INTRAMUSCULAR | Status: DC

## 2019-10-17 MED ORDER — EPOETIN ALFA-EPBX 10000 UNIT/ML IJ SOLN
20000.0000 [IU] | Freq: Once | INTRAMUSCULAR | Status: AC
  Administered 2019-10-17: 20000 [IU] via SUBCUTANEOUS
  Filled 2019-10-17: qty 2

## 2019-11-15 ENCOUNTER — Ambulatory Visit: Payer: Medicare Other | Admitting: Oncology

## 2019-11-15 ENCOUNTER — Other Ambulatory Visit: Payer: Medicare Other

## 2019-11-15 ENCOUNTER — Ambulatory Visit: Payer: Medicare Other

## 2019-11-18 ENCOUNTER — Encounter: Payer: Self-pay | Admitting: Oncology

## 2019-11-18 ENCOUNTER — Inpatient Hospital Stay (HOSPITAL_BASED_OUTPATIENT_CLINIC_OR_DEPARTMENT_OTHER): Payer: Medicare Other | Admitting: Oncology

## 2019-11-18 ENCOUNTER — Inpatient Hospital Stay: Payer: Medicare Other | Attending: Oncology

## 2019-11-18 ENCOUNTER — Other Ambulatory Visit: Payer: Self-pay

## 2019-11-18 ENCOUNTER — Inpatient Hospital Stay: Payer: Medicare Other

## 2019-11-18 VITALS — BP 125/74 | HR 52 | Temp 95.4°F | Wt 185.0 lb

## 2019-11-18 DIAGNOSIS — N184 Chronic kidney disease, stage 4 (severe): Secondary | ICD-10-CM

## 2019-11-18 DIAGNOSIS — D649 Anemia, unspecified: Secondary | ICD-10-CM | POA: Diagnosis not present

## 2019-11-18 DIAGNOSIS — D631 Anemia in chronic kidney disease: Secondary | ICD-10-CM | POA: Diagnosis present

## 2019-11-18 DIAGNOSIS — R9389 Abnormal findings on diagnostic imaging of other specified body structures: Secondary | ICD-10-CM

## 2019-11-18 DIAGNOSIS — Z79899 Other long term (current) drug therapy: Secondary | ICD-10-CM

## 2019-11-18 DIAGNOSIS — E538 Deficiency of other specified B group vitamins: Secondary | ICD-10-CM | POA: Diagnosis not present

## 2019-11-18 LAB — CBC WITH DIFFERENTIAL/PLATELET
Abs Immature Granulocytes: 0.02 10*3/uL (ref 0.00–0.07)
Basophils Absolute: 0 10*3/uL (ref 0.0–0.1)
Basophils Relative: 1 %
Eosinophils Absolute: 0.1 10*3/uL (ref 0.0–0.5)
Eosinophils Relative: 3 %
HCT: 29.6 % — ABNORMAL LOW (ref 36.0–46.0)
Hemoglobin: 8.7 g/dL — ABNORMAL LOW (ref 12.0–15.0)
Immature Granulocytes: 1 %
Lymphocytes Relative: 18 %
Lymphs Abs: 0.7 10*3/uL (ref 0.7–4.0)
MCH: 28.4 pg (ref 26.0–34.0)
MCHC: 29.4 g/dL — ABNORMAL LOW (ref 30.0–36.0)
MCV: 96.7 fL (ref 80.0–100.0)
Monocytes Absolute: 0.5 10*3/uL (ref 0.1–1.0)
Monocytes Relative: 11 %
Neutro Abs: 2.8 10*3/uL (ref 1.7–7.7)
Neutrophils Relative %: 66 %
Platelets: 175 10*3/uL (ref 150–400)
RBC: 3.06 MIL/uL — ABNORMAL LOW (ref 3.87–5.11)
RDW: 15.1 % (ref 11.5–15.5)
WBC: 4.2 10*3/uL (ref 4.0–10.5)
nRBC: 0 % (ref 0.0–0.2)

## 2019-11-18 LAB — FERRITIN: Ferritin: 161 ng/mL (ref 11–307)

## 2019-11-18 LAB — IRON AND TIBC
Iron: 47 ug/dL (ref 28–170)
Saturation Ratios: 16 % (ref 10.4–31.8)
TIBC: 304 ug/dL (ref 250–450)
UIBC: 257 ug/dL

## 2019-11-18 MED ORDER — EPOETIN ALFA-EPBX 40000 UNIT/ML IJ SOLN
40000.0000 [IU] | Freq: Once | INTRAMUSCULAR | Status: AC
  Administered 2019-11-18: 40000 [IU] via SUBCUTANEOUS
  Filled 2019-11-18: qty 1

## 2019-11-18 MED ORDER — EPOETIN ALFA-EPBX 40000 UNIT/ML IJ SOLN: 60000.0000 [IU] | Freq: Once | INTRAMUSCULAR | Status: DC

## 2019-11-18 MED ORDER — EPOETIN ALFA-EPBX 10000 UNIT/ML IJ SOLN
20000.0000 [IU] | Freq: Once | INTRAMUSCULAR | Status: AC
  Administered 2019-11-18: 20000 [IU] via SUBCUTANEOUS
  Filled 2019-11-18: qty 2

## 2019-11-18 NOTE — Progress Notes (Signed)
Hematology/Oncology Follow up note [facility] [phone] [phone]   Patient Care Team: Maryland Pink, MD as PCP - General (Family Medicine)  REFERRING PROVIDER: Maryland Pink, MD  REASON FOR VISIT Follow up for treatment of anemia of chronic kidney disease.  HISTORY OF PRESENTING ILLNESS:  Jeanette Romero is a  68 y.o.  female with PMH listed below who was referred to me for evaluation of labile INR. She has extensive cardiac comorbidities including history of aortic mechanic valve replacement, aneurysm of aorta, PAF, Coronary artery disease. She was on Amiodarone and also had drug induced hyperthyroidism.  She was sent to me for evaluation of labile INR.  Extensive review of her medical records from primary care physician's office showed  10/27/2017 INR 5.9,  10/24/2017 INR 4.9 10/12/2017 INR 1.2 10/07/2017 INR 6  10/04/2017 INR 8.1   08/11/2017 INR 7.5  05/24/2017 INR 3.4 04/26/2017 INR 3.5 03/15/2017 INR 3.4 01/31/2017 INR 2.7  08/29/2017 Folate 2.8,  B12 825 and the patient was started on folic acid supplementation.  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 Amiodarone was stopped. And patient was referred to endocrinologist for evaluation. She was found out to have Graves' disease and was started on prednisone. Last TSH that was done in the [place] system showed a TSH of 4.8.  # 3/9-3/13/2020  hospitalized due to weakness and noted to have acute on chronic anemia, black tarry stool. Status post EGD showing minimal gastritis and possible Barrett esophagus.  Biopsy was not done due to elevated INR and recent bleed.  Colonoscopy showing 3 to 4 mm sized sessile polyps.  Biopsies were not done.  Patient received PRBC transfusions during her  admission   INTERVAL HISTORY 68 y.o. female  presents for follow-up for management of anemia of CKD.  Patient reports feeling okay today.  Chronic fatigue is at baseline. Mood is good. Denies any chest pain, shortness of breath, abdominal pain today. No fever or chills No new complaints Patient is on anticoagulation.  No bleeding events.  Review of Systems  Constitutional: Positive for malaise/fatigue. Negative for chills, fever and weight loss.  HENT: Negative for sore throat.   Eyes: Negative for redness.  Respiratory: Negative for cough, shortness of breath and wheezing.   Cardiovascular: Negative for chest pain, palpitations and leg swelling.       Irregular heart beats  Gastrointestinal: Negative for abdominal pain, blood in stool, nausea and vomiting.  Genitourinary: Negative for dysuria.  Musculoskeletal: Negative for myalgias.  Skin: Negative for rash.  Neurological: Negative for dizziness, tingling and tremors.  Endo/Heme/Allergies: Does not bruise/bleed easily.  Psychiatric/Behavioral: Negative for hallucinations.    MEDICAL HISTORY:  Past Medical History:  Diagnosis Date  . Anemia in chronic kidney disease (CKD) 11/14/2017  . Anxiety   . Aortic valve regurgitation   . Aortic valve replaced   . Chronic kidney disease   . Coronary artery disease   . Dizziness   . Edema   . GERD (gastroesophageal reflux disease)   . Gout   . Hypercholesterolemia   . Hypertension   . Shortness of breath dyspnea     SURGICAL HISTORY: Past Surgical History:  Procedure Laterality Date  . ABDOMINAL HYSTERECTOMY    . AORTIC VALVE REPLACEMENT    . CARDIAC CATHETERIZATION    . CARDIAC CATHETERIZATION N/A  05/07/2015   Procedure: Left Heart Cath;  Surgeon: [name], MD;  Location: [place] [facility];  Service: Cardiovascular;  Laterality: N/A;  . CARDIAC VALVE REPLACEMENT    . COLONOSCOPY Left 02/20/2019   Procedure: COLONOSCOPY;  Surgeon: Virgel Manifold, MD;   Location: [facility] ENDOSCOPY;  Service: Endoscopy;  Laterality: Left;  . COLONOSCOPY N/A 02/21/2019   Procedure: COLONOSCOPY;  Surgeon: Virgel Manifold, MD;  Location: [facility] ENDOSCOPY;  Service: Endoscopy;  Laterality: N/A;  . ELECTROPHYSIOLOGIC STUDY N/A 05/05/2015   Procedure: Cardioversion;  Surgeon: [name], MD;  Location: [facility] ORS;  Service: Cardiovascular;  Laterality: N/A;  . ENTEROSCOPY N/A 02/21/2019   Procedure: ENTEROSCOPY;  Surgeon: Virgel Manifold, MD;  Location: [facility] ENDOSCOPY;  Service: Endoscopy;  Laterality: N/A;  . ESOPHAGOGASTRODUODENOSCOPY Left 02/19/2019   Procedure: ESOPHAGOGASTRODUODENOSCOPY (EGD);  Surgeon: Virgel Manifold, MD;  Location: [facility] ENDOSCOPY;  Service: Endoscopy;  Laterality: Left;  . REPLACEMENT TOTAL KNEE BILATERAL      SOCIAL HISTORY: Social History   Socioeconomic History  . Marital status: Married    Spouse name: Not on file  . Number of children: Not on file  . Years of education: Not on file  . Highest education level: Not on file  Occupational History  . Not on file  Social Needs  . Financial resource strain: Not on file  . Food insecurity    Worry: Not on file    Inability: Not on file  . Transportation needs    Medical: Not on file    Non-medical: Not on file  Tobacco Use  . Smoking status: Former Smoker    Packs/day: 0.50    Years: 20.00    Pack years: 10.00    Types: Cigarettes    Quit date: 2004    Years since quitting: 16.9  . Smokeless tobacco: Never Used  Substance and Sexual Activity  . Alcohol use: Yes    Alcohol/week: 0.0 standard drinks    Comment: rare  . Drug use: No  . Sexual activity: Not on file  Lifestyle  . Physical activity    Days per week: Not on file    Minutes per session: Not on file  . Stress: Not on file  Relationships  . Social Herbalist on phone: Not on file    Gets together: Not on file    Attends religious service: Not on file    Active member of club or  organization: Not on file    Attends meetings of clubs or organizations: Not on file    Relationship status: Not on file  . Intimate partner violence    Fear of current or ex partner: Not on file    Emotionally abused: Not on file    Physically abused: Not on file    Forced sexual activity: Not on file  Other Topics Concern  . Not on file  Social History Narrative  . Not on file    FAMILY HISTORY: Family History  Problem Relation Age of Onset  . Hypertension Mother   . Colon cancer Mother   . Prostate cancer Father   . Kidney cancer Brother   . Prostate cancer Brother   . Prostate cancer Brother   . Breast cancer Neg Hx     ALLERGIES:  is allergic to atorvastatin.  MEDICATIONS:  Current Outpatient Medications  Medication Sig Dispense Refill  . ALPRAZolam (XANAX) 0.25 MG tablet Take 0.25 mg by mouth 2 (two) times daily.    Marland Kitchen  amLODipine (NORVASC) 10 MG tablet Take 5 mg by mouth daily.     Marland Kitchen amoxicillin (AMOXIL) 500 MG tablet Take 2,000 mg by mouth as needed. For dental procedures    . aspirin EC 81 MG tablet Take 81 mg by mouth daily.    . benazepril (LOTENSIN) 40 MG tablet Take 40 mg by mouth daily.    . cloNIDine (CATAPRES) 0.3 MG tablet Take 0.3 mg 3 (three) times daily by mouth.  2  . folic acid (FOLVITE) 1 MG tablet TAKE 1 TABLET(1 MG) BY MOUTH DAILY 90 tablet 4  . furosemide (LASIX) 20 MG tablet Take 20-40 mg by mouth daily. Take one tablet on odd days and two tablets on even days    . methimazole (TAPAZOLE) 5 MG tablet Take 5 mg by mouth daily.   11  . pantoprazole (PROTONIX) 20 MG tablet Take 1 tablet (20 mg total) by mouth daily. 30 tablet 2  . potassium chloride (K-DUR) 10 MEQ tablet Take 10 mEq by mouth daily.     . rosuvastatin (CRESTOR) 5 MG tablet Take 5 mg by mouth at bedtime.  2  . SODIUM BICARBONATE, ANTACID, PO Take 10 mg by mouth.    . warfarin (COUMADIN) 4 MG tablet Take 4 mg by mouth daily.    Marland Kitchen warfarin (COUMADIN) 6 MG tablet Take 6 mg by mouth.      No current facility-administered medications for this visit.      PHYSICAL EXAMINATION: ECOG PERFORMANCE STATUS: 1 - Symptomatic but completely ambulatory Vitals:   11/18/19 0921 11/18/19 0926  BP: 130/90 125/74  Pulse: (!) 55 (!) 52  Temp: (!) 95.4 F (35.2 C)    Filed Weights   11/18/19 0921  Weight: 185 lb (83.9 kg)    Physical Exam  Constitutional: She is oriented to person, place, and time. No distress.  HENT:  Head: Normocephalic and atraumatic.  Nose: Nose normal.  Mouth/Throat: Oropharynx is clear and moist. No oropharyngeal exudate.  Eyes: Pupils are equal, round, and reactive to light. EOM are normal. Left eye exhibits no discharge. No scleral icterus.  Neck: Normal range of motion. Neck supple. No JVD present.  Cardiovascular: Normal rate and regular rhythm.  Murmur heard. Irregular beats  Pulmonary/Chest: Effort normal. No respiratory distress. She has no wheezes. She has no rales. She exhibits no tenderness.  Decreased breath sounds bilaterally.  No crackles  Abdominal: Soft. Bowel sounds are normal. She exhibits no distension and no mass. There is no abdominal tenderness. There is no rebound.  Musculoskeletal: Normal range of motion.        General: No tenderness or edema.  Lymphadenopathy:    She has no cervical adenopathy.  Neurological: She is alert and oriented to person, place, and time. No cranial nerve deficit. She exhibits normal muscle tone. Coordination normal.  Skin: Skin is warm and dry. No rash noted. She is not diaphoretic. No erythema.  Psychiatric: Mood and affect normal.     LABORATORY DATA:  I have reviewed the data as listed Lab Results  Component Value Date   WBC 4.2 11/18/2019   HGB 8.7 (L) 11/18/2019   HCT 29.6 (L) 11/18/2019   MCV 96.7 11/18/2019   PLT 175 11/18/2019   Recent Labs    02/20/19 0408 02/21/19 0454 10/04/19 1324  NA 140 139 141  K 4.9 4.1 4.6  CL 113* 111 107  CO2 19* 20* 24  GLUCOSE 92 106* 98  BUN  61* 54* 66*  CREATININE 2.20*  2.25* 2.42*  CALCIUM 9.0 8.9 10.2  GFRNONAA 22* 22* 20*  GFRAA 26* 25* 23*   Iron/TIBC/Ferritin/ %Sat    Component Value Date/Time   IRON 47 11/18/2019 0849   TIBC 304 11/18/2019 0849   FERRITIN 161 11/18/2019 0849   IRONPCTSAT 16 11/18/2019 0849    08/29/2017 Folate 2.8,  B12 825              01/02/2018 TSH 3.05                                                                                                                                                        RADIOGRAPHIC STUDIES: I have personally reviewed the radiological images as listed and agreed with the findings in the report. 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  Mild aneurysmal dilation of the proximal abdominal aorta 3.7 cm.  Femorofemoral crossover graft and right axillo femoral bypass graft noted.  Rounded fluid collection surrounds the distal aspect of the axillofemoral bypass graft.  Presumably postoperative seroma.  Moderate stool burden in the colon.  2 cm soft tissue nodule in the left posterior mediastinum presumably mildly enlarged lymph node.  This could be followed with a repeat CT chest in 6 months to assess stability.  Cardiomegaly.  CAD.  ASSESSMENT & PLAN:  1. Anemia of chronic renal failure, stage 4 (severe) ([place])   2. Encounter for ESA (erythropoietin stimulating agent) anemia management   3. Abnormal CT scan    #Anemia of chronic kidney disease Labs are reviewed and discussed with patient. Hemoglobin 8.7  today, worsened. In November, she received 60,000 units of Retacrit. Proceed with Retacrit 60,000 units today  #History of GI bleeding due to chronic anticoagulation. Iron panel was checked and results were available after the clinic. Ferritin was 61, iron saturation 16.  She cannot tolerate oral iron supplementation. We discussed about IV iron to further improve her iron store to be ferritin above 200. Patient agrees with the plan if it is needed. Iron labs reviewed.  I recommend to proceed with IV Venofer 200 mg x 1.  #History of folic acid deficiency, loud heart murmur.  Continue folic acid supplementation.  #Abnormal CT finding: Patient had CT abdomen pelvis without contrast on 06/28/2018 for evaluation of right lower abdomen swelling.  There were incidental findings of 2 cm soft tissue nodule in the left posterior mediastinum.  Questionable enlarged lymph nodes.  I have previously discussed with patient about need of repeating CT scan of chest to evaluate stability.  I have discussed with her on multiple occasion.  She has not update me on her decision.  All questions were answered. The patient knows to call the clinic with any problems questions or concerns. Orders Placed This Encounter  Procedures  . CBC with Differential/Platelet    Standing Status:   Future    Standing Expiration Date:   11/17/2020  . Ferritin    Standing Status:   Future    Standing Expiration Date:   11/17/2020    Return of visit: 4 weeks.  Earlie Server, MD, PhD 11/18/19

## 2019-11-18 NOTE — Progress Notes (Signed)
Patient does not offer any problems today.  

## 2019-11-21 ENCOUNTER — Inpatient Hospital Stay: Payer: Medicare Other

## 2019-11-21 ENCOUNTER — Other Ambulatory Visit: Payer: Self-pay

## 2019-11-21 VITALS — BP 108/78 | HR 77 | Temp 97.0°F | Resp 18

## 2019-11-21 DIAGNOSIS — D631 Anemia in chronic kidney disease: Secondary | ICD-10-CM

## 2019-11-21 DIAGNOSIS — N184 Chronic kidney disease, stage 4 (severe): Secondary | ICD-10-CM

## 2019-11-21 MED ORDER — IRON SUCROSE 20 MG/ML IV SOLN
200.0000 mg | Freq: Once | INTRAVENOUS | Status: AC
  Administered 2019-11-21: 200 mg via INTRAVENOUS
  Filled 2019-11-21: qty 10

## 2019-11-21 MED ORDER — SODIUM CHLORIDE 0.9 % IV SOLN
INTRAVENOUS | Status: DC
  Administered 2019-11-21: 14:00:00 via INTRAVENOUS
  Filled 2019-11-21: qty 250

## 2019-11-22 ENCOUNTER — Ambulatory Visit: Payer: Medicare Other

## 2019-12-09 ENCOUNTER — Other Ambulatory Visit: Payer: Self-pay | Admitting: Family Medicine

## 2019-12-09 DIAGNOSIS — Z1231 Encounter for screening mammogram for malignant neoplasm of breast: Secondary | ICD-10-CM

## 2019-12-16 ENCOUNTER — Other Ambulatory Visit: Payer: Self-pay

## 2019-12-16 ENCOUNTER — Inpatient Hospital Stay: Payer: Medicare Other | Attending: Oncology

## 2019-12-16 ENCOUNTER — Ambulatory Visit: Payer: Medicare Other | Admitting: Oncology

## 2019-12-16 ENCOUNTER — Inpatient Hospital Stay: Payer: Medicare Other

## 2019-12-16 VITALS — BP 141/87

## 2019-12-16 DIAGNOSIS — Z7982 Long term (current) use of aspirin: Secondary | ICD-10-CM | POA: Insufficient documentation

## 2019-12-16 DIAGNOSIS — Z87891 Personal history of nicotine dependence: Secondary | ICD-10-CM | POA: Diagnosis not present

## 2019-12-16 DIAGNOSIS — D649 Anemia, unspecified: Secondary | ICD-10-CM

## 2019-12-16 DIAGNOSIS — Z8042 Family history of malignant neoplasm of prostate: Secondary | ICD-10-CM | POA: Diagnosis not present

## 2019-12-16 DIAGNOSIS — Z800 Family history of malignant neoplasm of digestive organs: Secondary | ICD-10-CM | POA: Diagnosis not present

## 2019-12-16 DIAGNOSIS — Z7901 Long term (current) use of anticoagulants: Secondary | ICD-10-CM | POA: Insufficient documentation

## 2019-12-16 DIAGNOSIS — N184 Chronic kidney disease, stage 4 (severe): Secondary | ICD-10-CM

## 2019-12-16 DIAGNOSIS — Z79899 Other long term (current) drug therapy: Secondary | ICD-10-CM | POA: Diagnosis not present

## 2019-12-16 DIAGNOSIS — D631 Anemia in chronic kidney disease: Secondary | ICD-10-CM

## 2019-12-16 DIAGNOSIS — Z8051 Family history of malignant neoplasm of kidney: Secondary | ICD-10-CM | POA: Diagnosis not present

## 2019-12-16 DIAGNOSIS — Z8249 Family history of ischemic heart disease and other diseases of the circulatory system: Secondary | ICD-10-CM | POA: Insufficient documentation

## 2019-12-16 LAB — CBC WITH DIFFERENTIAL/PLATELET
Abs Immature Granulocytes: 0.01 10*3/uL (ref 0.00–0.07)
Basophils Absolute: 0.1 10*3/uL (ref 0.0–0.1)
Basophils Relative: 1 %
Eosinophils Absolute: 0.1 10*3/uL (ref 0.0–0.5)
Eosinophils Relative: 3 %
HCT: 32.5 % — ABNORMAL LOW (ref 36.0–46.0)
Hemoglobin: 9.4 g/dL — ABNORMAL LOW (ref 12.0–15.0)
Immature Granulocytes: 0 %
Lymphocytes Relative: 17 %
Lymphs Abs: 0.8 10*3/uL (ref 0.7–4.0)
MCH: 27.8 pg (ref 26.0–34.0)
MCHC: 28.9 g/dL — ABNORMAL LOW (ref 30.0–36.0)
MCV: 96.2 fL (ref 80.0–100.0)
Monocytes Absolute: 0.5 10*3/uL (ref 0.1–1.0)
Monocytes Relative: 11 %
Neutro Abs: 3.3 10*3/uL (ref 1.7–7.7)
Neutrophils Relative %: 68 %
Platelets: 197 10*3/uL (ref 150–400)
RBC: 3.38 MIL/uL — ABNORMAL LOW (ref 3.87–5.11)
RDW: 14.7 % (ref 11.5–15.5)
WBC: 4.8 10*3/uL (ref 4.0–10.5)
nRBC: 0 % (ref 0.0–0.2)

## 2019-12-16 LAB — FERRITIN: Ferritin: 178 ng/mL (ref 11–307)

## 2019-12-16 MED ORDER — EPOETIN ALFA-EPBX 10000 UNIT/ML IJ SOLN
10000.0000 [IU] | Freq: Once | INTRAMUSCULAR | Status: AC
  Administered 2019-12-16: 10000 [IU] via SUBCUTANEOUS
  Filled 2019-12-16: qty 1

## 2019-12-16 MED ORDER — EPOETIN ALFA-EPBX 40000 UNIT/ML IJ SOLN
40000.0000 [IU] | Freq: Once | INTRAMUSCULAR | Status: AC
  Administered 2019-12-16: 40000 [IU] via SUBCUTANEOUS
  Filled 2019-12-16: qty 1

## 2019-12-16 MED ORDER — EPOETIN ALFA-EPBX 40000 UNIT/ML IJ SOLN: 60000.0000 [IU] | Freq: Once | INTRAMUSCULAR | Status: DC

## 2019-12-16 NOTE — Progress Notes (Signed)
Patient is doing well no complaints 

## 2019-12-17 ENCOUNTER — Encounter: Payer: Self-pay | Admitting: Oncology

## 2019-12-17 ENCOUNTER — Inpatient Hospital Stay (HOSPITAL_BASED_OUTPATIENT_CLINIC_OR_DEPARTMENT_OTHER): Payer: Medicare Other | Admitting: Oncology

## 2019-12-17 DIAGNOSIS — D649 Anemia, unspecified: Secondary | ICD-10-CM

## 2019-12-17 DIAGNOSIS — Z79899 Other long term (current) drug therapy: Secondary | ICD-10-CM | POA: Diagnosis not present

## 2019-12-17 DIAGNOSIS — D631 Anemia in chronic kidney disease: Secondary | ICD-10-CM

## 2019-12-17 DIAGNOSIS — N184 Chronic kidney disease, stage 4 (severe): Secondary | ICD-10-CM | POA: Diagnosis not present

## 2019-12-17 NOTE — Progress Notes (Signed)
HEMATOLOGY-ONCOLOGY TeleHEALTH VISIT PROGRESS NOTE  I connected with Jeanette Romero on 12/17/19 at  2:45 PM EST by video enabled telemedicine visit and verified that I am speaking with the correct person using two identifiers. I discussed the limitations, risks, security and privacy concerns of performing an evaluation and management service by telemedicine and the availability of in-person appointments. I also discussed with the patient that there may be a patient responsible charge related to this service. The patient expressed understanding and agreed to proceed.   Other persons participating in the visit and their role in the encounter:  None  Patient's location: Home  Provider's location: office Chief Complaint: Anemia in chronic kidney disease   INTERVAL HISTORY Jeanette Romero is a 69 y.o. female who has above history reviewed by me today presents for follow up visit for management of anemia and chronic kidney disease Problems and complaints are listed below:  Patient has been on monthly Retacrit. She reports feeling better.  Chronic fatigue has improved.  Denies any chest pain, leg swelling, pain today. She has no new complaints today. Review of Systems  Constitutional: Positive for fatigue. Negative for appetite change, chills and fever.  HENT:   Negative for hearing loss and voice change.   Eyes: Negative for eye problems.  Respiratory: Negative for chest tightness and cough.   Cardiovascular: Negative for chest pain.  Gastrointestinal: Negative for abdominal distention, abdominal pain and blood in stool.  Endocrine: Negative for hot flashes.  Genitourinary: Negative for difficulty urinating and frequency.   Musculoskeletal: Negative for arthralgias.  Skin: Negative for itching and rash.  Neurological: Negative for extremity weakness.  Hematological: Negative for adenopathy.  Psychiatric/Behavioral: Negative for confusion.    Past Medical History:  Diagnosis Date  .  Anemia in chronic kidney disease (CKD) 11/14/2017  . Anxiety   . Aortic valve regurgitation   . Aortic valve replaced   . Chronic kidney disease   . Coronary artery disease   . Dizziness   . Edema   . GERD (gastroesophageal reflux disease)   . Gout   . Hypercholesterolemia   . Hypertension   . Shortness of breath dyspnea    Past Surgical History:  Procedure Laterality Date  . ABDOMINAL HYSTERECTOMY    . AORTIC VALVE REPLACEMENT    . CARDIAC CATHETERIZATION    . CARDIAC CATHETERIZATION N/A 05/07/2015   Procedure: Left Heart Cath;  Surgeon: [name], MD;  Location: [place] [facility];  Service: Cardiovascular;  Laterality: N/A;  . CARDIAC VALVE REPLACEMENT    . COLONOSCOPY Left 02/20/2019   Procedure: COLONOSCOPY;  Surgeon: Virgel Manifold, MD;  Location: [facility] ENDOSCOPY;  Service: Endoscopy;  Laterality: Left;  . COLONOSCOPY N/A 02/21/2019   Procedure: COLONOSCOPY;  Surgeon: Virgel Manifold, MD;  Location: [facility] ENDOSCOPY;  Service: Endoscopy;  Laterality: N/A;  . ELECTROPHYSIOLOGIC STUDY N/A 05/05/2015   Procedure: Cardioversion;  Surgeon: [name], MD;  Location: [facility] ORS;  Service: Cardiovascular;  Laterality: N/A;  . ENTEROSCOPY N/A 02/21/2019   Procedure: ENTEROSCOPY;  Surgeon: Virgel Manifold, MD;  Location: [facility] ENDOSCOPY;  Service: Endoscopy;  Laterality: N/A;  . ESOPHAGOGASTRODUODENOSCOPY Left 02/19/2019   Procedure: ESOPHAGOGASTRODUODENOSCOPY (EGD);  Surgeon: Virgel Manifold, MD;  Location: [facility] ENDOSCOPY;  Service: Endoscopy;  Laterality: Left;  . REPLACEMENT TOTAL KNEE BILATERAL      Family History  Problem Relation Age of Onset  . Hypertension Mother   . Colon cancer Mother   . Prostate cancer Father   .  Kidney cancer Brother   . Prostate cancer Brother   . Prostate cancer Brother   . Breast cancer Neg Hx     Social History   Socioeconomic History  . Marital status: Married    Spouse name: Not on file  . Number of children: Not on  file  . Years of education: Not on file  . Highest education level: Not on file  Occupational History  . Not on file  Tobacco Use  . Smoking status: Former Smoker    Packs/day: 0.50    Years: 20.00    Pack years: 10.00    Types: Cigarettes    Quit date: 2004    Years since quitting: 17.0  . Smokeless tobacco: Never Used  Substance and Sexual Activity  . Alcohol use: Yes    Alcohol/week: 0.0 standard drinks    Comment: rare  . Drug use: No  . Sexual activity: Not on file  Other Topics Concern  . Not on file  Social History Narrative  . Not on file   Social Determinants of Health   Financial Resource Strain:   . Difficulty of Paying Living Expenses: Not on file  Food Insecurity:   . Worried About Charity fundraiser in the Last Year: Not on file  . Ran Out of Food in the Last Year: Not on file  Transportation Needs:   . Lack of Transportation (Medical): Not on file  . Lack of Transportation (Non-Medical): Not on file  Physical Activity:   . Days of Exercise per Week: Not on file  . Minutes of Exercise per Session: Not on file  Stress:   . Feeling of Stress : Not on file  Social Connections:   . Frequency of Communication with Friends and Family: Not on file  . Frequency of Social Gatherings with Friends and Family: Not on file  . Attends Religious Services: Not on file  . Active Member of Clubs or Organizations: Not on file  . Attends Archivist Meetings: Not on file  . Marital Status: Not on file  Intimate Partner Violence:   . Fear of Current or Ex-Partner: Not on file  . Emotionally Abused: Not on file  . Physically Abused: Not on file  . Sexually Abused: Not on file    Current Outpatient Medications on File Prior to Visit  Medication Sig Dispense Refill  . ALPRAZolam (XANAX) 0.25 MG tablet Take 0.25 mg by mouth 2 (two) times daily.    Marland Kitchen amLODipine (NORVASC) 10 MG tablet Take 5 mg by mouth daily.     Marland Kitchen amoxicillin (AMOXIL) 500 MG tablet Take 2,000  mg by mouth as needed. For dental procedures    . aspirin EC 81 MG tablet Take 81 mg by mouth daily.    . benazepril (LOTENSIN) 40 MG tablet Take 40 mg by mouth daily.    . cloNIDine (CATAPRES) 0.3 MG tablet Take 0.3 mg 3 (three) times daily by mouth.  2  . folic acid (FOLVITE) 1 MG tablet TAKE 1 TABLET(1 MG) BY MOUTH DAILY 90 tablet 4  . furosemide (LASIX) 20 MG tablet Take 20-40 mg by mouth daily. Take one tablet on odd days and two tablets on even days    . methimazole (TAPAZOLE) 5 MG tablet Take 5 mg by mouth daily.   11  . pantoprazole (PROTONIX) 20 MG tablet Take 1 tablet (20 mg total) by mouth daily. 30 tablet 2  . rosuvastatin (CRESTOR) 5 MG tablet Take 5 mg  by mouth at bedtime.  2  . SODIUM BICARBONATE, ANTACID, PO Take 10 mg by mouth.    . warfarin (COUMADIN) 4 MG tablet Take 4 mg by mouth daily.    Marland Kitchen warfarin (COUMADIN) 6 MG tablet Take 6 mg by mouth.    . potassium chloride (K-DUR) 10 MEQ tablet Take 10 mEq by mouth daily.      No current facility-administered medications on file prior to visit.    Allergies  Allergen Reactions  . Atorvastatin     Other reaction(s): Liver Disorder Elevated liver enzymes       Observations/Objective: Today's Vitals   12/16/19 1344  PainSc: 0-No pain   There is no height or weight on file to calculate BMI.  Physical Exam  Constitutional: No distress.  Neurological: She is alert.  Psychiatric: Mood normal.   Labs are reviewed and discussed with patient.  CBC    Component Value Date/Time   WBC 4.8 12/16/2019 0948   RBC 3.38 (L) 12/16/2019 0948   HGB 9.4 (L) 12/16/2019 0948   HGB 8.4 (L) 03/10/2012 0457   HCT 32.5 (L) 12/16/2019 0948   HCT 25.2 (L) 03/10/2012 0457   PLT 197 12/16/2019 0948   PLT 179 03/10/2012 0457   MCV 96.2 12/16/2019 0948   MCV 88 03/10/2012 0457   MCH 27.8 12/16/2019 0948   MCHC 28.9 (L) 12/16/2019 0948   RDW 14.7 12/16/2019 0948   RDW 15.7 (H) 03/10/2012 0457   LYMPHSABS 0.8 12/16/2019 0948    LYMPHSABS 0.8 (L) 03/10/2012 0457   MONOABS 0.5 12/16/2019 0948   MONOABS 1.2 (H) 03/10/2012 0457   EOSABS 0.1 12/16/2019 0948   EOSABS 0.0 03/10/2012 0457   BASOSABS 0.1 12/16/2019 0948   BASOSABS 0.0 03/10/2012 0457    CMP     Component Value Date/Time   NA 141 10/04/2019 1324   NA 138 03/10/2012 0457   K 4.6 10/04/2019 1324   K 4.0 03/10/2012 0457   CL 107 10/04/2019 1324   CL 103 03/10/2012 0457   CO2 24 10/04/2019 1324   CO2 25 03/10/2012 0457   GLUCOSE 98 10/04/2019 1324   GLUCOSE 99 03/10/2012 0457   BUN 66 (H) 10/04/2019 1324   BUN 23 (H) 03/10/2012 0457   CREATININE 2.42 (H) 10/04/2019 1324   CREATININE 1.32 (H) 03/10/2012 0457   CALCIUM 10.2 10/04/2019 1324   CALCIUM 8.8 03/10/2012 0457   PROT 7.5 06/28/2018 1841   ALBUMIN 3.9 06/28/2018 1841   AST 18 06/28/2018 1841   ALT 10 06/28/2018 1841   ALKPHOS 156 (H) 06/28/2018 1841   BILITOT 0.5 06/28/2018 1841   GFRNONAA 20 (L) 10/04/2019 1324   GFRNONAA 43 (L) 03/10/2012 0457   GFRAA 23 (L) 10/04/2019 1324   GFRAA 53 (L) 03/10/2012 0457     Assessment and Plan: 1. Anemia of chronic renal failure, stage 4 (severe) ([place])   2. Encounter for ESA (erythropoietin stimulating agent) anemia management     Labs reviewed and discussed with patient.  Anemia of chronic kidney disease. Hemoglobin 9.4.  Improved and responding well to erythropoietin replacement treatment. Hemoglobin is less than 10, she has received 60,000 units of Retacrit yesterday. Recommend patient to continue follow-up monthly with repeat labs+/-Retacrit if hemoglobin is less than 10. I will see her in 3 months. Patient agrees with the plan.  I discussed the assessment and treatment plan with the patient. The patient was provided an opportunity to ask questions and all were answered. The patient agreed  with the plan and demonstrated an understanding of the instructions.  The patient was advised to call back or seek an in-person evaluation if the  symptoms worsen or if the condition fails to improve as anticipated.    Earlie Server, MD 12/17/2019 3:01 PM

## 2020-01-03 ENCOUNTER — Ambulatory Visit
Admission: RE | Admit: 2020-01-03 | Discharge: 2020-01-03 | Disposition: A | Discharge status: Home / Self Care | Payer: Medicare Other | Source: Ambulatory Visit | Attending: Family Medicine | Admitting: Family Medicine

## 2020-01-03 DIAGNOSIS — Z1231 Encounter for screening mammogram for malignant neoplasm of breast: Secondary | ICD-10-CM | POA: Diagnosis present

## 2020-01-08 IMAGING — CR CHEST - 2 VIEW
1 series · 2 of 2 positions shown · non-contrast
Comparison: Radiographs February 17, 2019.

CLINICAL DATA: Dyspnea status post colonoscopy.

EXAM:
CHEST - 2 VIEW

[Series 1: dg chest 2 view · 0.14mm/px · 2 of 2 slices shown]
[im 1/2]
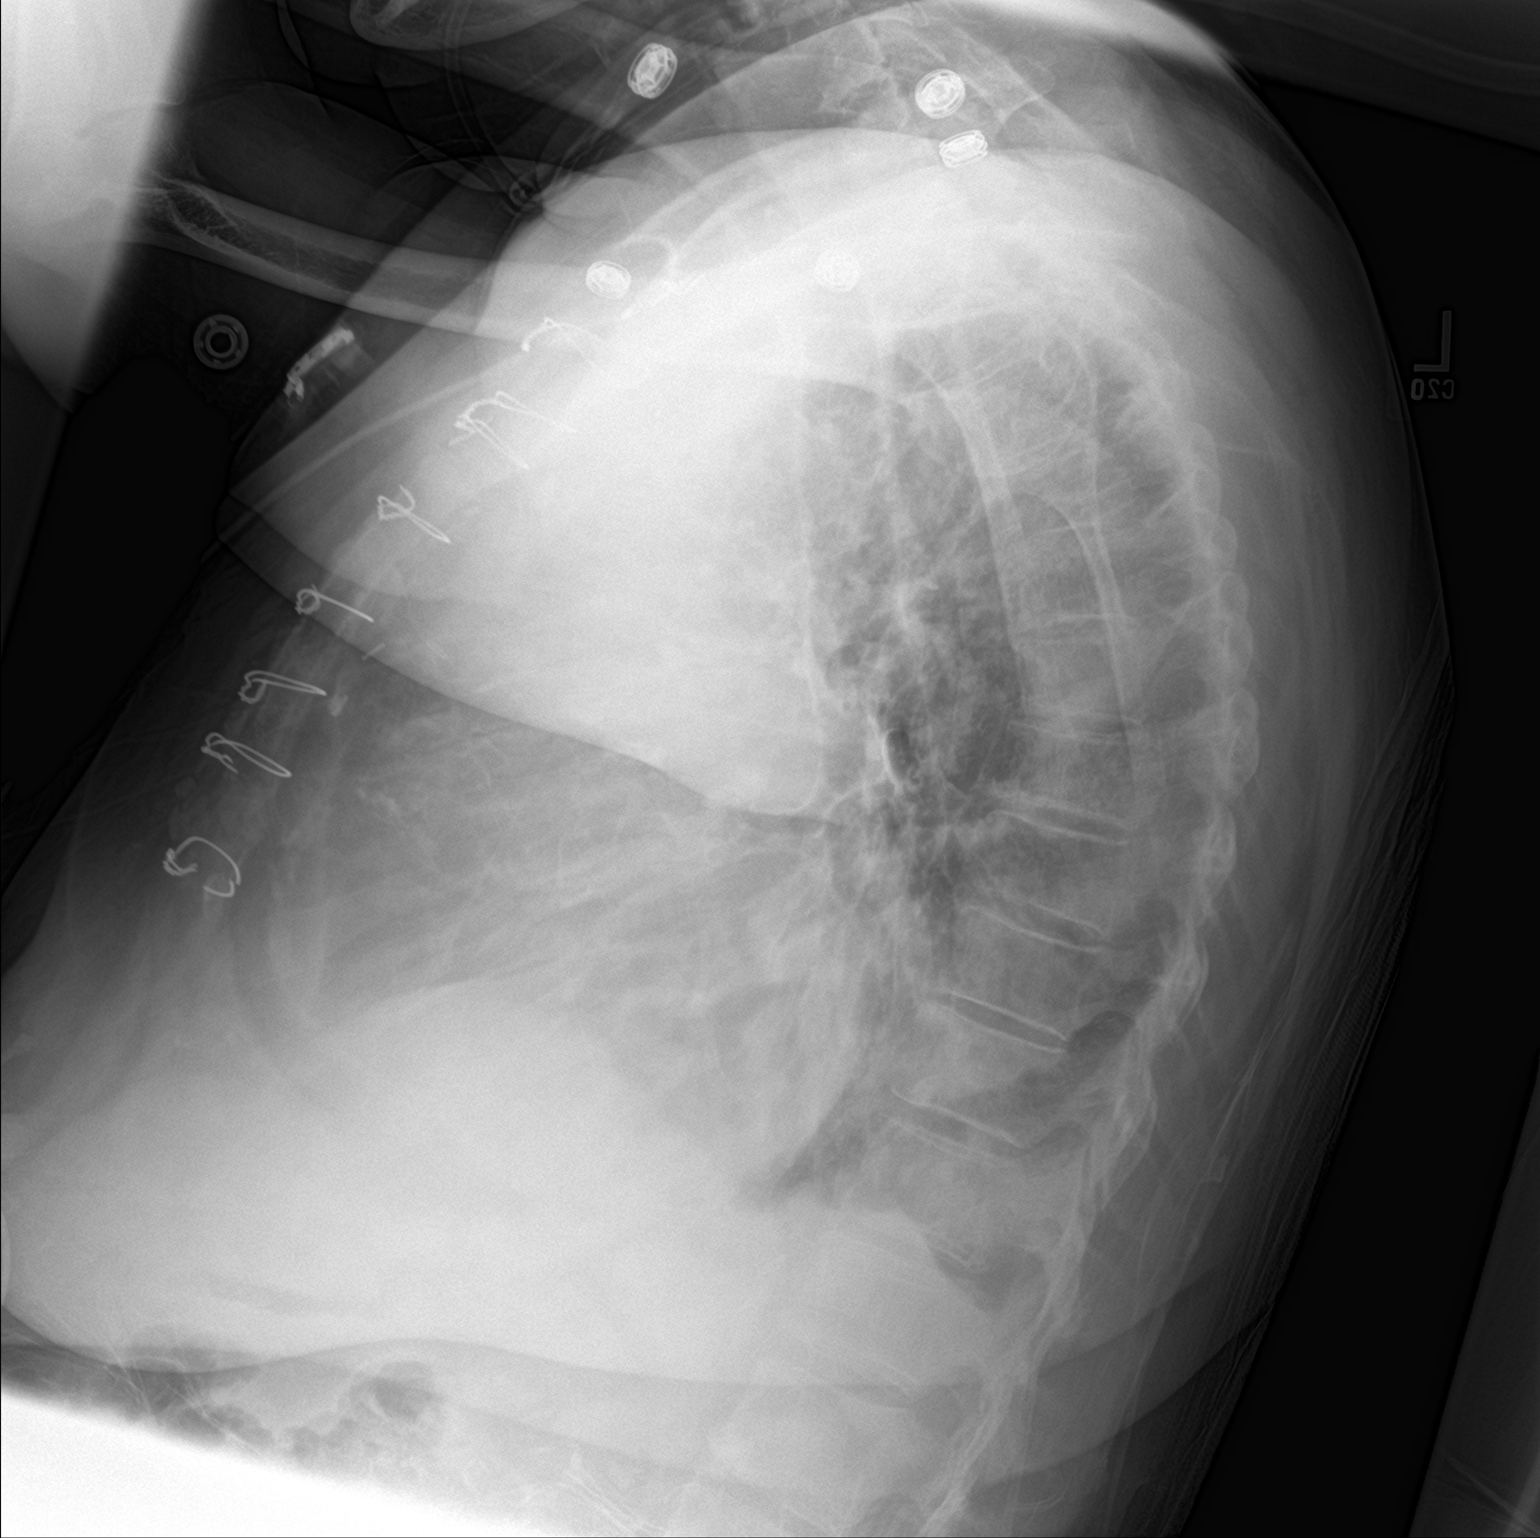
[im 2/2]
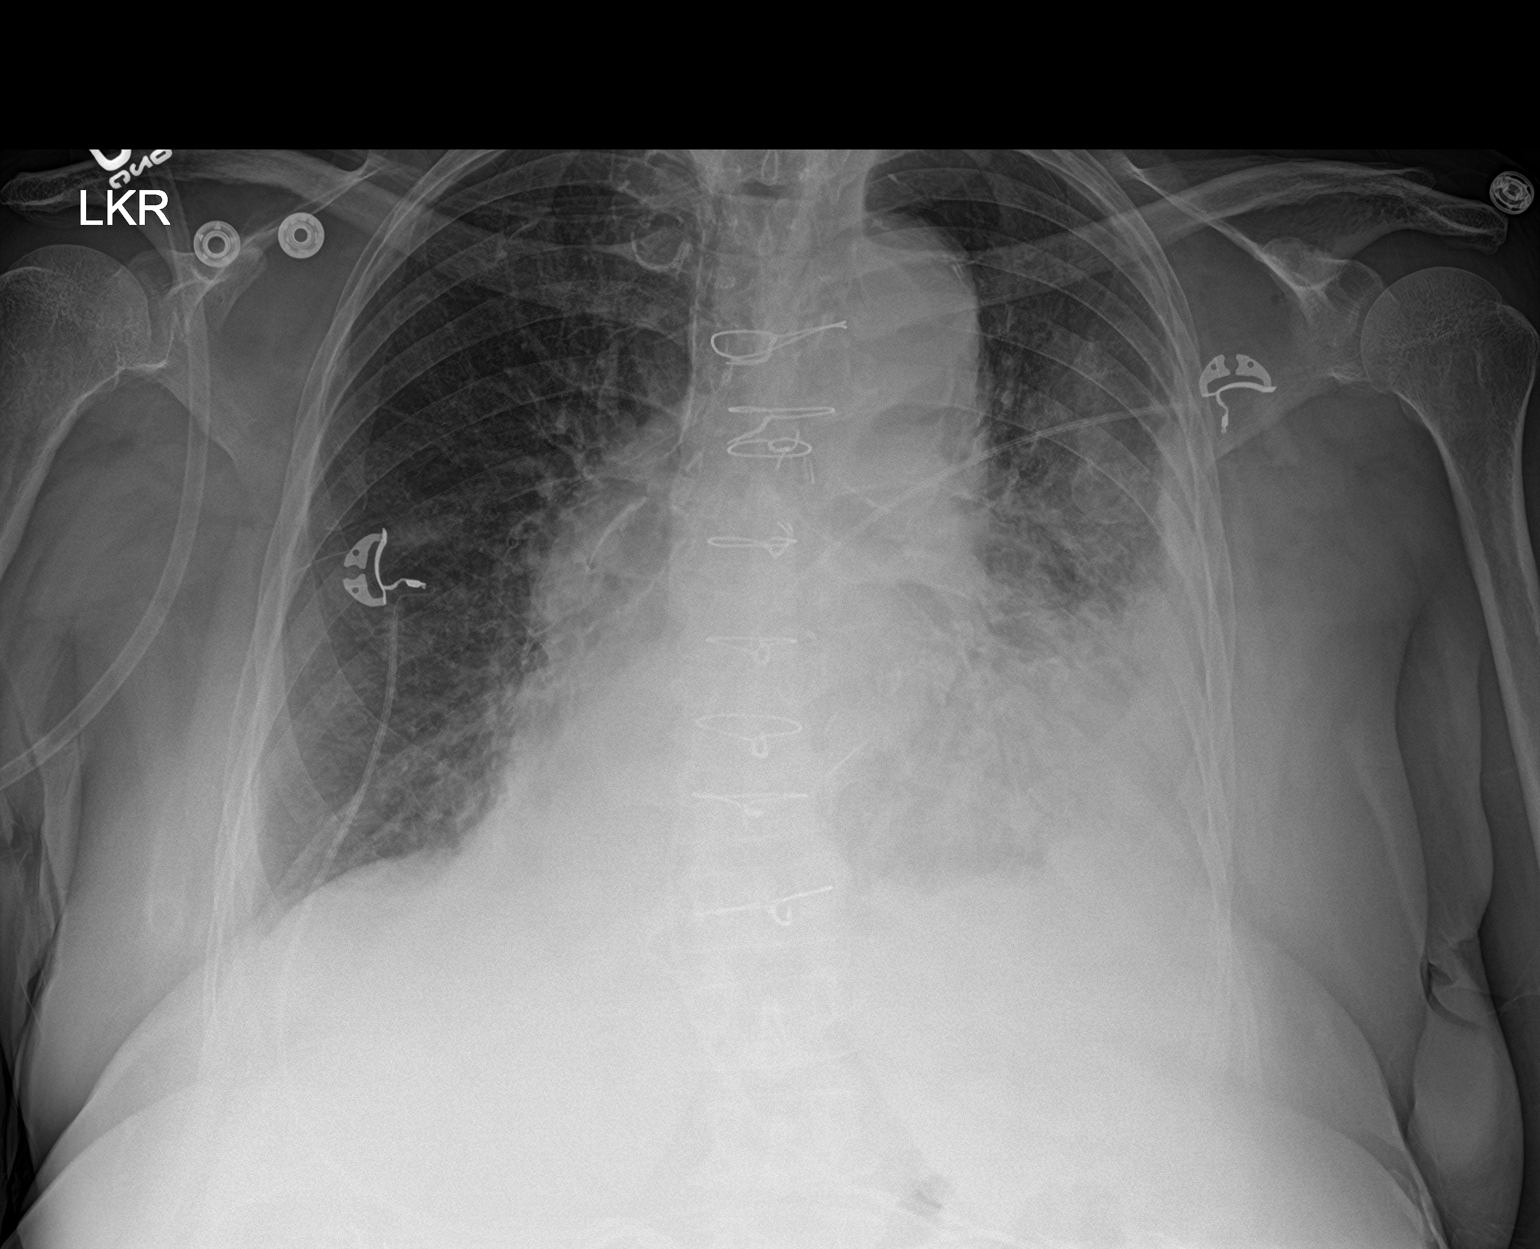

[2 of 2 positions shown; findings below may reference images not displayed]

FINDINGS: Stable cardiomegaly. Sternotomy wires are noted. No pneumothorax is
noted. Right lung is clear. Interval development of moderate left
pleural effusion with associated left basilar atelectasis or
infiltrate. Bony thorax is unremarkable.
IMPRESSION: Interval development of moderate left pleural effusion with
associated left basilar atelectasis or infiltrate.

## 2020-01-14 ENCOUNTER — Other Ambulatory Visit: Payer: Self-pay

## 2020-01-14 ENCOUNTER — Inpatient Hospital Stay: Payer: Medicare Other

## 2020-01-14 ENCOUNTER — Inpatient Hospital Stay: Payer: Medicare Other | Attending: Oncology

## 2020-01-14 VITALS — BP 103/71 | HR 76

## 2020-01-14 DIAGNOSIS — D631 Anemia in chronic kidney disease: Secondary | ICD-10-CM | POA: Diagnosis present

## 2020-01-14 DIAGNOSIS — N184 Chronic kidney disease, stage 4 (severe): Secondary | ICD-10-CM

## 2020-01-14 LAB — CBC WITH DIFFERENTIAL/PLATELET
Abs Immature Granulocytes: 0.01 10*3/uL (ref 0.00–0.07)
Basophils Absolute: 0 10*3/uL (ref 0.0–0.1)
Basophils Relative: 1 %
Eosinophils Absolute: 0.2 10*3/uL (ref 0.0–0.5)
Eosinophils Relative: 4 %
HCT: 30.5 % — ABNORMAL LOW (ref 36.0–46.0)
Hemoglobin: 8.8 g/dL — ABNORMAL LOW (ref 12.0–15.0)
Immature Granulocytes: 0 %
Lymphocytes Relative: 20 %
Lymphs Abs: 0.9 10*3/uL (ref 0.7–4.0)
MCH: 27.9 pg (ref 26.0–34.0)
MCHC: 28.9 g/dL — ABNORMAL LOW (ref 30.0–36.0)
MCV: 96.8 fL (ref 80.0–100.0)
Monocytes Absolute: 0.5 10*3/uL (ref 0.1–1.0)
Monocytes Relative: 11 %
Neutro Abs: 2.8 10*3/uL (ref 1.7–7.7)
Neutrophils Relative %: 64 %
Platelets: 175 10*3/uL (ref 150–400)
RBC: 3.15 MIL/uL — ABNORMAL LOW (ref 3.87–5.11)
RDW: 14.6 % (ref 11.5–15.5)
WBC: 4.4 10*3/uL (ref 4.0–10.5)
nRBC: 0 % (ref 0.0–0.2)

## 2020-01-14 MED ORDER — EPOETIN ALFA-EPBX 40000 UNIT/ML IJ SOLN: 60000.0000 [IU] | Freq: Once | INTRAMUSCULAR | Status: DC

## 2020-01-14 MED ORDER — EPOETIN ALFA-EPBX 40000 UNIT/ML IJ SOLN
40000.0000 [IU] | Freq: Once | INTRAMUSCULAR | Status: AC
  Administered 2020-01-14: 11:00:00 40000 [IU] via SUBCUTANEOUS
  Filled 2020-01-14: qty 1

## 2020-01-14 MED ORDER — EPOETIN ALFA-EPBX 10000 UNIT/ML IJ SOLN
20000.0000 [IU] | Freq: Once | INTRAMUSCULAR | Status: AC
  Administered 2020-01-14: 11:00:00 20000 [IU] via SUBCUTANEOUS
  Filled 2020-01-14: qty 2

## 2020-02-18 ENCOUNTER — Inpatient Hospital Stay: Payer: Medicare Other | Attending: Oncology

## 2020-02-18 ENCOUNTER — Inpatient Hospital Stay: Payer: Medicare Other

## 2020-02-18 ENCOUNTER — Other Ambulatory Visit: Payer: Self-pay

## 2020-02-18 VITALS — BP 121/80 | HR 50

## 2020-02-18 DIAGNOSIS — D631 Anemia in chronic kidney disease: Secondary | ICD-10-CM | POA: Diagnosis present

## 2020-02-18 DIAGNOSIS — N184 Chronic kidney disease, stage 4 (severe): Secondary | ICD-10-CM | POA: Diagnosis not present

## 2020-02-18 LAB — CBC WITH DIFFERENTIAL/PLATELET
Abs Immature Granulocytes: 0.01 10*3/uL (ref 0.00–0.07)
Basophils Absolute: 0 10*3/uL (ref 0.0–0.1)
Basophils Relative: 1 %
Eosinophils Absolute: 0.3 10*3/uL (ref 0.0–0.5)
Eosinophils Relative: 6 %
HCT: 30.2 % — ABNORMAL LOW (ref 36.0–46.0)
Hemoglobin: 8.8 g/dL — ABNORMAL LOW (ref 12.0–15.0)
Immature Granulocytes: 0 %
Lymphocytes Relative: 16 %
Lymphs Abs: 0.8 10*3/uL (ref 0.7–4.0)
MCH: 28.1 pg (ref 26.0–34.0)
MCHC: 29.1 g/dL — ABNORMAL LOW (ref 30.0–36.0)
MCV: 96.5 fL (ref 80.0–100.0)
Monocytes Absolute: 0.5 10*3/uL (ref 0.1–1.0)
Monocytes Relative: 12 %
Neutro Abs: 3.1 10*3/uL (ref 1.7–7.7)
Neutrophils Relative %: 65 %
Platelets: 166 10*3/uL (ref 150–400)
RBC: 3.13 MIL/uL — ABNORMAL LOW (ref 3.87–5.11)
RDW: 14.6 % (ref 11.5–15.5)
WBC: 4.7 10*3/uL (ref 4.0–10.5)
nRBC: 0 % (ref 0.0–0.2)

## 2020-02-18 MED ORDER — EPOETIN ALFA-EPBX 10000 UNIT/ML IJ SOLN
20000.0000 [IU] | Freq: Once | INTRAMUSCULAR | Status: AC
  Administered 2020-02-18: 20000 [IU] via SUBCUTANEOUS
  Filled 2020-02-18: qty 2

## 2020-02-18 MED ORDER — EPOETIN ALFA-EPBX 40000 UNIT/ML IJ SOLN: 60000.0000 [IU] | Freq: Once | INTRAMUSCULAR | Status: DC

## 2020-02-18 MED ORDER — EPOETIN ALFA-EPBX 40000 UNIT/ML IJ SOLN
40000.0000 [IU] | Freq: Once | INTRAMUSCULAR | Status: AC
  Administered 2020-02-18: 12:00:00 40000 [IU] via SUBCUTANEOUS
  Filled 2020-02-18: qty 1

## 2020-02-24 ENCOUNTER — Other Ambulatory Visit: Payer: Self-pay | Admitting: *Deleted

## 2020-02-24 MED ORDER — FOLIC ACID 1 MG PO TABS: ORAL_TABLET | ORAL | 0 refills | Status: DC

## 2020-02-25 ENCOUNTER — Emergency Department
Admission: EM | Admit: 2020-02-25 | Discharge: 2020-02-25 | Disposition: A | Discharge status: Home / Self Care | Payer: No Typology Code available for payment source | Attending: Emergency Medicine | Admitting: Emergency Medicine

## 2020-02-25 ENCOUNTER — Emergency Department: Payer: No Typology Code available for payment source

## 2020-02-25 ENCOUNTER — Other Ambulatory Visit: Payer: Self-pay

## 2020-02-25 ENCOUNTER — Encounter: Payer: Self-pay | Admitting: Emergency Medicine

## 2020-02-25 DIAGNOSIS — M7918 Myalgia, other site: Secondary | ICD-10-CM | POA: Diagnosis not present

## 2020-02-25 DIAGNOSIS — Z87891 Personal history of nicotine dependence: Secondary | ICD-10-CM | POA: Insufficient documentation

## 2020-02-25 DIAGNOSIS — S39012A Strain of muscle, fascia and tendon of lower back, initial encounter: Secondary | ICD-10-CM | POA: Diagnosis not present

## 2020-02-25 DIAGNOSIS — Y9389 Activity, other specified: Secondary | ICD-10-CM | POA: Insufficient documentation

## 2020-02-25 DIAGNOSIS — I129 Hypertensive chronic kidney disease with stage 1 through stage 4 chronic kidney disease, or unspecified chronic kidney disease: Secondary | ICD-10-CM | POA: Insufficient documentation

## 2020-02-25 DIAGNOSIS — I2510 Atherosclerotic heart disease of native coronary artery without angina pectoris: Secondary | ICD-10-CM | POA: Insufficient documentation

## 2020-02-25 DIAGNOSIS — Z96651 Presence of right artificial knee joint: Secondary | ICD-10-CM | POA: Insufficient documentation

## 2020-02-25 DIAGNOSIS — Z952 Presence of prosthetic heart valve: Secondary | ICD-10-CM | POA: Insufficient documentation

## 2020-02-25 DIAGNOSIS — Y92410 Unspecified street and highway as the place of occurrence of the external cause: Secondary | ICD-10-CM | POA: Insufficient documentation

## 2020-02-25 DIAGNOSIS — N189 Chronic kidney disease, unspecified: Secondary | ICD-10-CM | POA: Diagnosis not present

## 2020-02-25 DIAGNOSIS — Z96652 Presence of left artificial knee joint: Secondary | ICD-10-CM | POA: Diagnosis not present

## 2020-02-25 DIAGNOSIS — Z7901 Long term (current) use of anticoagulants: Secondary | ICD-10-CM | POA: Insufficient documentation

## 2020-02-25 DIAGNOSIS — S3992XA Unspecified injury of lower back, initial encounter: Secondary | ICD-10-CM | POA: Diagnosis present

## 2020-02-25 DIAGNOSIS — Z79899 Other long term (current) drug therapy: Secondary | ICD-10-CM | POA: Diagnosis not present

## 2020-02-25 DIAGNOSIS — Y998 Other external cause status: Secondary | ICD-10-CM | POA: Diagnosis not present

## 2020-02-25 LAB — URINALYSIS, COMPLETE (UACMP) WITH MICROSCOPIC
Bilirubin Urine: NEGATIVE
Glucose, UA: NEGATIVE mg/dL
Hgb urine dipstick: NEGATIVE
Ketones, ur: NEGATIVE mg/dL
Leukocytes,Ua: NEGATIVE
Nitrite: NEGATIVE
Protein, ur: NEGATIVE mg/dL
Specific Gravity, Urine: 1.006 (ref 1.005–1.030)
pH: 7 (ref 5.0–8.0)

## 2020-02-25 MED ORDER — CYCLOBENZAPRINE HCL 5 MG PO TABS: 5.0000 mg | ORAL_TABLET | Freq: Three times a day (TID) | ORAL | 0 refills | Status: DC | PRN

## 2020-02-25 MED ORDER — TRAMADOL HCL 50 MG PO TABS: 50.0000 mg | ORAL_TABLET | Freq: Two times a day (BID) | ORAL | 0 refills | Status: DC | PRN
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
## 2020-02-25 NOTE — Discharge Instructions (Signed)
Follow discharge care instruction take medication as needed.

## 2020-03-10 ENCOUNTER — Inpatient Hospital Stay: Payer: Medicare Other

## 2020-03-11 ENCOUNTER — Inpatient Hospital Stay: Payer: Medicare Other | Admitting: Oncology

## 2020-03-18 ENCOUNTER — Inpatient Hospital Stay: Payer: Medicare Other | Attending: Oncology

## 2020-03-18 ENCOUNTER — Other Ambulatory Visit: Payer: Self-pay

## 2020-03-18 ENCOUNTER — Inpatient Hospital Stay: Payer: Medicare Other

## 2020-03-18 VITALS — BP 133/78 | HR 75

## 2020-03-18 DIAGNOSIS — N184 Chronic kidney disease, stage 4 (severe): Secondary | ICD-10-CM | POA: Insufficient documentation

## 2020-03-18 DIAGNOSIS — D631 Anemia in chronic kidney disease: Secondary | ICD-10-CM | POA: Diagnosis present

## 2020-03-18 LAB — FERRITIN: Ferritin: 235 ng/mL (ref 11–307)

## 2020-03-18 LAB — CBC WITH DIFFERENTIAL/PLATELET
Abs Immature Granulocytes: 0.02 10*3/uL (ref 0.00–0.07)
Basophils Absolute: 0 10*3/uL (ref 0.0–0.1)
Basophils Relative: 1 %
Eosinophils Absolute: 0.1 10*3/uL (ref 0.0–0.5)
Eosinophils Relative: 3 %
HCT: 29.8 % — ABNORMAL LOW (ref 36.0–46.0)
Hemoglobin: 8.9 g/dL — ABNORMAL LOW (ref 12.0–15.0)
Immature Granulocytes: 0 %
Lymphocytes Relative: 18 %
Lymphs Abs: 0.8 10*3/uL (ref 0.7–4.0)
MCH: 28.3 pg (ref 26.0–34.0)
MCHC: 29.9 g/dL — ABNORMAL LOW (ref 30.0–36.0)
MCV: 94.6 fL (ref 80.0–100.0)
Monocytes Absolute: 0.5 10*3/uL (ref 0.1–1.0)
Monocytes Relative: 11 %
Neutro Abs: 3.2 10*3/uL (ref 1.7–7.7)
Neutrophils Relative %: 67 %
Platelets: 198 10*3/uL (ref 150–400)
RBC: 3.15 MIL/uL — ABNORMAL LOW (ref 3.87–5.11)
RDW: 15 % (ref 11.5–15.5)
WBC: 4.7 10*3/uL (ref 4.0–10.5)
nRBC: 0 % (ref 0.0–0.2)

## 2020-03-18 LAB — IRON AND TIBC
Iron: 55 ug/dL (ref 28–170)
Saturation Ratios: 18 % (ref 10.4–31.8)
TIBC: 315 ug/dL (ref 250–450)
UIBC: 260 ug/dL

## 2020-03-18 MED ORDER — EPOETIN ALFA-EPBX 40000 UNIT/ML IJ SOLN: 60000.0000 [IU] | Freq: Once | INTRAMUSCULAR | Status: DC

## 2020-03-18 MED ORDER — EPOETIN ALFA-EPBX 10000 UNIT/ML IJ SOLN
10000.0000 [IU] | Freq: Once | INTRAMUSCULAR | Status: AC
  Administered 2020-03-18: 11:00:00 10000 [IU] via SUBCUTANEOUS
  Filled 2020-03-18: qty 1

## 2020-03-18 MED ORDER — EPOETIN ALFA-EPBX 40000 UNIT/ML IJ SOLN
40000.0000 [IU] | Freq: Once | INTRAMUSCULAR | Status: AC
  Administered 2020-03-18: 11:00:00 40000 [IU] via SUBCUTANEOUS
  Filled 2020-03-18: qty 1

## 2020-03-18 MED ORDER — EPOETIN ALFA-EPBX 10000 UNIT/ML IJ SOLN
10000.0000 [IU] | Freq: Once | INTRAMUSCULAR | Status: AC
  Administered 2020-03-18: 10000 [IU] via SUBCUTANEOUS
  Filled 2020-03-18: qty 1

## 2020-03-19 ENCOUNTER — Encounter: Payer: Self-pay | Admitting: Oncology

## 2020-03-19 ENCOUNTER — Inpatient Hospital Stay (HOSPITAL_BASED_OUTPATIENT_CLINIC_OR_DEPARTMENT_OTHER): Payer: Medicare Other | Admitting: Oncology

## 2020-03-19 DIAGNOSIS — Z79899 Other long term (current) drug therapy: Secondary | ICD-10-CM | POA: Diagnosis not present

## 2020-03-19 DIAGNOSIS — N184 Chronic kidney disease, stage 4 (severe): Secondary | ICD-10-CM | POA: Diagnosis not present

## 2020-03-19 DIAGNOSIS — D631 Anemia in chronic kidney disease: Secondary | ICD-10-CM

## 2020-03-19 DIAGNOSIS — D649 Anemia, unspecified: Secondary | ICD-10-CM

## 2020-03-19 NOTE — Progress Notes (Signed)
Patient contacted for Mychart visit. No new concerns reported.

## 2020-03-19 NOTE — Progress Notes (Signed)
HEMATOLOGY-ONCOLOGY TeleHEALTH VISIT PROGRESS NOTE  I connected with Jeanette Romero on 03/19/20 at 11:00 AM EDT by video enabled telemedicine visit and verified that I am speaking with the correct person using two identifiers. I discussed the limitations, risks, security and privacy concerns of performing an evaluation and management service by telemedicine and the availability of in-person appointments. I also discussed with the patient that there may be a patient responsible charge related to this service. The patient expressed understanding and agreed to proceed.   Other persons participating in the visit and their role in the encounter:  None  Patient's location: Home  Provider's location: office Chief Complaint: Anemia in chronic kidney disease   INTERVAL HISTORY Jeanette Romero is a 69 y.o. female who has above history reviewed by me today presents for follow up visit for management of anemia and chronic kidney disease Problems and complaints are listed below:  She has been on monthly retacrit. She feels well. No new complaints.  Currently off methimazole.  Review of Systems  Constitutional: Positive for fatigue. Negative for appetite change, chills and fever.  HENT:   Negative for hearing loss and voice change.   Eyes: Negative for eye problems.  Respiratory: Negative for chest tightness and cough.   Cardiovascular: Negative for chest pain.  Gastrointestinal: Negative for abdominal distention, abdominal pain and blood in stool.  Endocrine: Negative for hot flashes.  Genitourinary: Negative for difficulty urinating and frequency.   Musculoskeletal: Negative for arthralgias.  Skin: Negative for itching and rash.  Neurological: Negative for extremity weakness.  Hematological: Negative for adenopathy.  Psychiatric/Behavioral: Negative for confusion.    Past Medical History:  Diagnosis Date  . Anemia in chronic kidney disease (CKD) 11/14/2017  . Anxiety   . Aortic valve  regurgitation   . Aortic valve replaced   . Chronic kidney disease   . Coronary artery disease   . Dizziness   . Edema   . GERD (gastroesophageal reflux disease)   . Gout   . Hypercholesterolemia   . Hypertension   . Shortness of breath dyspnea    Past Surgical History:  Procedure Laterality Date  . ABDOMINAL HYSTERECTOMY    . AORTIC VALVE REPLACEMENT    . CARDIAC CATHETERIZATION    . CARDIAC CATHETERIZATION N/A 05/07/2015   Procedure: Left Heart Cath;  Surgeon: [name], MD;  Location: [place] CV LAB;  Service: Cardiovascular;  Laterality: N/A;  . CARDIAC VALVE REPLACEMENT    . COLONOSCOPY Left 02/20/2019   Procedure: COLONOSCOPY;  Surgeon: Virgel Manifold, MD;  Location: [facility] ENDOSCOPY;  Service: Endoscopy;  Laterality: Left;  . COLONOSCOPY N/A 02/21/2019   Procedure: COLONOSCOPY;  Surgeon: Virgel Manifold, MD;  Location: [facility] ENDOSCOPY;  Service: Endoscopy;  Laterality: N/A;  . ELECTROPHYSIOLOGIC STUDY N/A 05/05/2015   Procedure: Cardioversion;  Surgeon: [name], MD;  Location: [facility] ORS;  Service: Cardiovascular;  Laterality: N/A;  . ENTEROSCOPY N/A 02/21/2019   Procedure: ENTEROSCOPY;  Surgeon: Virgel Manifold, MD;  Location: [facility] ENDOSCOPY;  Service: Endoscopy;  Laterality: N/A;  . ESOPHAGOGASTRODUODENOSCOPY Left 02/19/2019   Procedure: ESOPHAGOGASTRODUODENOSCOPY (EGD);  Surgeon: Virgel Manifold, MD;  Location: [facility] ENDOSCOPY;  Service: Endoscopy;  Laterality: Left;  . REPLACEMENT TOTAL KNEE BILATERAL      Family History  Problem Relation Age of Onset  . Hypertension Mother   . Colon cancer Mother   . Prostate cancer Father   . Kidney cancer Brother   . Prostate cancer Brother   .  Prostate cancer Brother   . Breast cancer Neg Hx     Social History   Socioeconomic History  . Marital status: Married    Spouse name: Not on file  . Number of children: Not on file  . Years of education: Not on file  . Highest education level: Not on  file  Occupational History  . Not on file  Tobacco Use  . Smoking status: Former Smoker    Packs/day: 0.50    Years: 20.00    Pack years: 10.00    Types: Cigarettes    Quit date: 2004    Years since quitting: 17.2  . Smokeless tobacco: Never Used  Substance and Sexual Activity  . Alcohol use: Yes    Alcohol/week: 0.0 standard drinks  . Drug use: No  . Sexual activity: Not on file  Other Topics Concern  . Not on file  Social History Narrative  . Not on file   Social Determinants of Health   Financial Resource Strain:   . Difficulty of Paying Living Expenses:   Food Insecurity:   . Worried About Charity fundraiser in the Last Year:   . Arboriculturist in the Last Year:   Transportation Needs:   . Film/video editor (Medical):   Marland Kitchen Lack of Transportation (Non-Medical):   Physical Activity:   . Days of Exercise per Week:   . Minutes of Exercise per Session:   Stress:   . Feeling of Stress :   Social Connections:   . Frequency of Communication with Friends and Family:   . Frequency of Social Gatherings with Friends and Family:   . Attends Religious Services:   . Active Member of Clubs or Organizations:   . Attends Archivist Meetings:   Marland Kitchen Marital Status:   Intimate Partner Violence:   . Fear of Current or Ex-Partner:   . Emotionally Abused:   Marland Kitchen Physically Abused:   . Sexually Abused:     Current Outpatient Medications on File Prior to Visit  Medication Sig Dispense Refill  . ALPRAZolam (XANAX) 0.25 MG tablet Take 0.25 mg by mouth 2 (two) times daily.    Marland Kitchen amLODipine (NORVASC) 10 MG tablet Take 5 mg by mouth daily.     Marland Kitchen aspirin EC 81 MG tablet Take 81 mg by mouth daily.    . benazepril (LOTENSIN) 40 MG tablet Take 40 mg by mouth daily.    . cloNIDine (CATAPRES) 0.3 MG tablet Take 0.3 mg 3 (three) times daily by mouth.  2  . cyclobenzaprine (FLEXERIL) 5 MG tablet Take 1 tablet (5 mg total) by mouth 3 (three) times daily as needed for muscle spasms. 15  tablet 0  . folic acid (FOLVITE) 1 MG tablet TAKE 1 TABLET(1 MG) BY MOUTH DAILY 90 tablet 0  . furosemide (LASIX) 20 MG tablet Take 20-40 mg by mouth daily. Take one tablet on odd days and two tablets on even days    . pantoprazole (PROTONIX) 20 MG tablet Take 1 tablet (20 mg total) by mouth daily. 30 tablet 2  . potassium chloride (K-DUR) 10 MEQ tablet Take 10 mEq by mouth daily.     . rosuvastatin (CRESTOR) 5 MG tablet Take 5 mg by mouth at bedtime.  2  . SODIUM BICARBONATE, ANTACID, PO Take 10 mg by mouth.    . traMADol (ULTRAM) 50 MG tablet Take 1 tablet (50 mg total) by mouth every 12 (twelve) hours as needed. 12 tablet 0  .  warfarin (COUMADIN) 4 MG tablet Take 4 mg by mouth daily.    Marland Kitchen warfarin (COUMADIN) 6 MG tablet Take 6 mg by mouth.    Marland Kitchen amoxicillin (AMOXIL) 500 MG tablet Take 2,000 mg by mouth as needed. For dental procedures    . hydrALAZINE (APRESOLINE) 100 MG tablet Take 100 mg by mouth 2 (two) times daily.     No current facility-administered medications on file prior to visit.    Allergies  Allergen Reactions  . Atorvastatin Other (See Comments)    Other reaction(s): Liver Disorder Elevated liver enzymes       Observations/Objective: There were no vitals filed for this visit. There is no height or weight on file to calculate BMI.  Physical Exam  Constitutional: No distress.  Neurological: She is alert.  Psychiatric: Mood normal.   Labs are reviewed and discussed with patient.  CBC    Component Value Date/Time   WBC 4.7 03/18/2020 1036   RBC 3.15 (L) 03/18/2020 1036   HGB 8.9 (L) 03/18/2020 1036   HGB 8.4 (L) 03/10/2012 0457   HCT 29.8 (L) 03/18/2020 1036   HCT 25.2 (L) 03/10/2012 0457   PLT 198 03/18/2020 1036   PLT 179 03/10/2012 0457   MCV 94.6 03/18/2020 1036   MCV 88 03/10/2012 0457   MCH 28.3 03/18/2020 1036   MCHC 29.9 (L) 03/18/2020 1036   RDW 15.0 03/18/2020 1036   RDW 15.7 (H) 03/10/2012 0457   LYMPHSABS 0.8 03/18/2020 1036   LYMPHSABS 0.8  (L) 03/10/2012 0457   MONOABS 0.5 03/18/2020 1036   MONOABS 1.2 (H) 03/10/2012 0457   EOSABS 0.1 03/18/2020 1036   EOSABS 0.0 03/10/2012 0457   BASOSABS 0.0 03/18/2020 1036   BASOSABS 0.0 03/10/2012 0457    CMP     Component Value Date/Time   NA 141 10/04/2019 1324   NA 138 03/10/2012 0457   K 4.6 10/04/2019 1324   K 4.0 03/10/2012 0457   CL 107 10/04/2019 1324   CL 103 03/10/2012 0457   CO2 24 10/04/2019 1324   CO2 25 03/10/2012 0457   GLUCOSE 98 10/04/2019 1324   GLUCOSE 99 03/10/2012 0457   BUN 66 (H) 10/04/2019 1324   BUN 23 (H) 03/10/2012 0457   CREATININE 2.42 (H) 10/04/2019 1324   CREATININE 1.32 (H) 03/10/2012 0457   CALCIUM 10.2 10/04/2019 1324   CALCIUM 8.8 03/10/2012 0457   PROT 7.5 06/28/2018 1841   ALBUMIN 3.9 06/28/2018 1841   AST 18 06/28/2018 1841   ALT 10 06/28/2018 1841   ALKPHOS 156 (H) 06/28/2018 1841   BILITOT 0.5 06/28/2018 1841   GFRNONAA 20 (L) 10/04/2019 1324   GFRNONAA 43 (L) 03/10/2012 0457   GFRAA 23 (L) 10/04/2019 1324   GFRAA 53 (L) 03/10/2012 0457     Assessment and Plan: 1. Anemia of chronic renal failure, stage 4 (severe) ([place])   2. Encounter for ESA (erythropoietin stimulating agent) anemia management     Labs are reviewed and discussed with patient. Hemoglobin 8.9,  Stable. Clinically she is doing well. Continue Retacrit 60,000 units monthly if hemoglobin less than 10. Follow-up with H&H in 4 weeks and 8 weeks +/-Retacrit  Follow-up in 3 months She agrees with the plan.  She prefers virtual visits.  I discussed the assessment and treatment plan with the patient. The patient was provided an opportunity to ask questions and all were answered. The patient agreed with the plan and demonstrated an understanding of the instructions.  The patient was advised  to call back or seek an in-person evaluation if the symptoms worsen or if the condition fails to improve as anticipated.    Earlie Server, MD 03/19/2020 4:23 PM

## 2020-04-16 ENCOUNTER — Inpatient Hospital Stay: Payer: Medicare Other

## 2020-04-16 ENCOUNTER — Other Ambulatory Visit: Payer: Self-pay

## 2020-04-16 ENCOUNTER — Inpatient Hospital Stay: Payer: Medicare Other | Attending: Oncology

## 2020-04-16 VITALS — BP 121/76 | HR 60

## 2020-04-16 DIAGNOSIS — N184 Chronic kidney disease, stage 4 (severe): Secondary | ICD-10-CM | POA: Diagnosis not present

## 2020-04-16 DIAGNOSIS — D631 Anemia in chronic kidney disease: Secondary | ICD-10-CM | POA: Insufficient documentation

## 2020-04-16 LAB — HEMOGLOBIN AND HEMATOCRIT, BLOOD
HCT: 29.7 % — ABNORMAL LOW (ref 36.0–46.0)
Hemoglobin: 9 g/dL — ABNORMAL LOW (ref 12.0–15.0)

## 2020-04-16 MED ORDER — EPOETIN ALFA-EPBX 40000 UNIT/ML IJ SOLN
40000.0000 [IU] | Freq: Once | INTRAMUSCULAR | Status: AC
  Administered 2020-04-16: 40000 [IU] via SUBCUTANEOUS
  Filled 2020-04-16: qty 1

## 2020-04-16 MED ORDER — EPOETIN ALFA-EPBX 10000 UNIT/ML IJ SOLN
20000.0000 [IU] | Freq: Once | INTRAMUSCULAR | Status: AC
  Administered 2020-04-16: 20000 [IU] via SUBCUTANEOUS
  Filled 2020-04-16: qty 2

## 2020-04-16 MED ORDER — EPOETIN ALFA-EPBX 40000 UNIT/ML IJ SOLN: 60000.0000 [IU] | Freq: Once | INTRAMUSCULAR | Status: DC

## 2020-05-14 ENCOUNTER — Other Ambulatory Visit: Payer: Self-pay

## 2020-05-14 ENCOUNTER — Inpatient Hospital Stay: Payer: Medicare Other

## 2020-05-14 ENCOUNTER — Inpatient Hospital Stay: Payer: Medicare Other | Attending: Oncology

## 2020-05-14 VITALS — BP 121/74 | HR 73

## 2020-05-14 DIAGNOSIS — D631 Anemia in chronic kidney disease: Secondary | ICD-10-CM

## 2020-05-14 DIAGNOSIS — N184 Chronic kidney disease, stage 4 (severe): Secondary | ICD-10-CM | POA: Diagnosis not present

## 2020-05-14 LAB — CBC WITH DIFFERENTIAL/PLATELET
Abs Immature Granulocytes: 0.01 10*3/uL (ref 0.00–0.07)
Basophils Absolute: 0.1 10*3/uL (ref 0.0–0.1)
Basophils Relative: 1 %
Eosinophils Absolute: 0.2 10*3/uL (ref 0.0–0.5)
Eosinophils Relative: 4 %
HCT: 29.7 % — ABNORMAL LOW (ref 36.0–46.0)
Hemoglobin: 9 g/dL — ABNORMAL LOW (ref 12.0–15.0)
Immature Granulocytes: 0 %
Lymphocytes Relative: 17 %
Lymphs Abs: 0.8 10*3/uL (ref 0.7–4.0)
MCH: 28.6 pg (ref 26.0–34.0)
MCHC: 30.3 g/dL (ref 30.0–36.0)
MCV: 94.3 fL (ref 80.0–100.0)
Monocytes Absolute: 0.5 10*3/uL (ref 0.1–1.0)
Monocytes Relative: 10 %
Neutro Abs: 2.9 10*3/uL (ref 1.7–7.7)
Neutrophils Relative %: 68 %
Platelets: 202 10*3/uL (ref 150–400)
RBC: 3.15 MIL/uL — ABNORMAL LOW (ref 3.87–5.11)
RDW: 14.9 % (ref 11.5–15.5)
WBC: 4.3 10*3/uL (ref 4.0–10.5)
nRBC: 0 % (ref 0.0–0.2)

## 2020-05-14 MED ORDER — EPOETIN ALFA-EPBX 10000 UNIT/ML IJ SOLN
20000.0000 [IU] | Freq: Once | INTRAMUSCULAR | Status: AC
  Administered 2020-05-14: 20000 [IU] via SUBCUTANEOUS
  Filled 2020-05-14: qty 2

## 2020-05-14 MED ORDER — EPOETIN ALFA-EPBX 40000 UNIT/ML IJ SOLN
40000.0000 [IU] | Freq: Once | INTRAMUSCULAR | Status: AC
  Administered 2020-05-14: 40000 [IU] via SUBCUTANEOUS
  Filled 2020-05-14: qty 1

## 2020-05-14 MED ORDER — EPOETIN ALFA-EPBX 40000 UNIT/ML IJ SOLN: 60000.0000 [IU] | Freq: Once | INTRAMUSCULAR | Status: DC

## 2020-06-03 ENCOUNTER — Other Ambulatory Visit: Payer: Self-pay | Admitting: *Deleted

## 2020-06-03 MED ORDER — FOLIC ACID 1 MG PO TABS: ORAL_TABLET | ORAL | 0 refills | Status: DC

## 2020-06-18 ENCOUNTER — Inpatient Hospital Stay: Payer: Medicare Other

## 2020-06-18 ENCOUNTER — Inpatient Hospital Stay: Payer: Medicare Other | Attending: Oncology

## 2020-06-18 ENCOUNTER — Other Ambulatory Visit: Payer: Self-pay

## 2020-06-18 VITALS — BP 141/76 | HR 50

## 2020-06-18 DIAGNOSIS — D631 Anemia in chronic kidney disease: Secondary | ICD-10-CM | POA: Diagnosis present

## 2020-06-18 DIAGNOSIS — K219 Gastro-esophageal reflux disease without esophagitis: Secondary | ICD-10-CM | POA: Diagnosis not present

## 2020-06-18 DIAGNOSIS — Z87891 Personal history of nicotine dependence: Secondary | ICD-10-CM | POA: Insufficient documentation

## 2020-06-18 DIAGNOSIS — Z7901 Long term (current) use of anticoagulants: Secondary | ICD-10-CM | POA: Diagnosis not present

## 2020-06-18 DIAGNOSIS — Z79899 Other long term (current) drug therapy: Secondary | ICD-10-CM | POA: Insufficient documentation

## 2020-06-18 DIAGNOSIS — E7800 Pure hypercholesterolemia, unspecified: Secondary | ICD-10-CM | POA: Diagnosis not present

## 2020-06-18 DIAGNOSIS — D509 Iron deficiency anemia, unspecified: Secondary | ICD-10-CM | POA: Insufficient documentation

## 2020-06-18 DIAGNOSIS — Z7982 Long term (current) use of aspirin: Secondary | ICD-10-CM | POA: Diagnosis not present

## 2020-06-18 DIAGNOSIS — N184 Chronic kidney disease, stage 4 (severe): Secondary | ICD-10-CM | POA: Insufficient documentation

## 2020-06-18 DIAGNOSIS — F419 Anxiety disorder, unspecified: Secondary | ICD-10-CM | POA: Diagnosis not present

## 2020-06-18 DIAGNOSIS — I10 Essential (primary) hypertension: Secondary | ICD-10-CM | POA: Insufficient documentation

## 2020-06-18 LAB — CBC WITH DIFFERENTIAL/PLATELET
Abs Immature Granulocytes: 0.03 10*3/uL (ref 0.00–0.07)
Basophils Absolute: 0 10*3/uL (ref 0.0–0.1)
Basophils Relative: 1 %
Eosinophils Absolute: 0.1 10*3/uL (ref 0.0–0.5)
Eosinophils Relative: 2 %
HCT: 28.6 % — ABNORMAL LOW (ref 36.0–46.0)
Hemoglobin: 9.1 g/dL — ABNORMAL LOW (ref 12.0–15.0)
Immature Granulocytes: 1 %
Lymphocytes Relative: 18 %
Lymphs Abs: 0.9 10*3/uL (ref 0.7–4.0)
MCH: 29.4 pg (ref 26.0–34.0)
MCHC: 31.8 g/dL (ref 30.0–36.0)
MCV: 92.3 fL (ref 80.0–100.0)
Monocytes Absolute: 0.5 10*3/uL (ref 0.1–1.0)
Monocytes Relative: 10 %
Neutro Abs: 3.3 10*3/uL (ref 1.7–7.7)
Neutrophils Relative %: 68 %
Platelets: 197 10*3/uL (ref 150–400)
RBC: 3.1 MIL/uL — ABNORMAL LOW (ref 3.87–5.11)
RDW: 14.9 % (ref 11.5–15.5)
WBC: 4.8 10*3/uL (ref 4.0–10.5)
nRBC: 0 % (ref 0.0–0.2)

## 2020-06-18 LAB — COMPREHENSIVE METABOLIC PANEL
ALT: 12 U/L (ref 0–44)
AST: 15 U/L (ref 15–41)
Albumin: 4.1 g/dL (ref 3.5–5.0)
Alkaline Phosphatase: 83 U/L (ref 38–126)
Anion gap: 10 (ref 5–15)
BUN: 73 mg/dL — ABNORMAL HIGH (ref 8–23)
CO2: 24 mmol/L (ref 22–32)
Calcium: 9.2 mg/dL (ref 8.9–10.3)
Chloride: 108 mmol/L (ref 98–111)
Creatinine, Ser: 2.82 mg/dL — ABNORMAL HIGH (ref 0.44–1.00)
GFR calc Af Amer: 19 mL/min — ABNORMAL LOW (ref >60)
GFR calc non Af Amer: 16 mL/min — ABNORMAL LOW (ref >60)
Glucose, Bld: 104 mg/dL — ABNORMAL HIGH (ref 70–99)
Potassium: 5.2 mmol/L — ABNORMAL HIGH (ref 3.5–5.1)
Sodium: 142 mmol/L (ref 135–145)
Total Bilirubin: 0.5 mg/dL (ref 0.3–1.2)
Total Protein: 7.5 g/dL (ref 6.5–8.1)

## 2020-06-18 LAB — IRON AND TIBC
Iron: 47 ug/dL (ref 28–170)
Saturation Ratios: 16 % (ref 10.4–31.8)
TIBC: 304 ug/dL (ref 250–450)
UIBC: 257 ug/dL

## 2020-06-18 LAB — FERRITIN: Ferritin: 133 ng/mL (ref 11–307)

## 2020-06-18 MED ORDER — EPOETIN ALFA-EPBX 10000 UNIT/ML IJ SOLN
10000.0000 [IU] | Freq: Once | INTRAMUSCULAR | Status: AC
  Administered 2020-06-18: 10000 [IU] via SUBCUTANEOUS
  Filled 2020-06-18: qty 1

## 2020-06-18 MED ORDER — EPOETIN ALFA-EPBX 40000 UNIT/ML IJ SOLN: 60000.0000 [IU] | Freq: Once | INTRAMUSCULAR | Status: DC

## 2020-06-18 MED ORDER — EPOETIN ALFA-EPBX 40000 UNIT/ML IJ SOLN
40000.0000 [IU] | Freq: Once | INTRAMUSCULAR | Status: AC
  Administered 2020-06-18: 40000 [IU] via SUBCUTANEOUS
  Filled 2020-06-18: qty 1

## 2020-06-19 ENCOUNTER — Encounter: Payer: Self-pay | Admitting: Oncology

## 2020-06-19 ENCOUNTER — Inpatient Hospital Stay (HOSPITAL_BASED_OUTPATIENT_CLINIC_OR_DEPARTMENT_OTHER): Payer: Medicare Other | Admitting: Oncology

## 2020-06-19 DIAGNOSIS — D631 Anemia in chronic kidney disease: Secondary | ICD-10-CM

## 2020-06-19 DIAGNOSIS — N184 Chronic kidney disease, stage 4 (severe): Secondary | ICD-10-CM | POA: Diagnosis not present

## 2020-06-19 DIAGNOSIS — D649 Anemia, unspecified: Secondary | ICD-10-CM

## 2020-06-19 DIAGNOSIS — Z79899 Other long term (current) drug therapy: Secondary | ICD-10-CM

## 2020-06-19 DIAGNOSIS — D509 Iron deficiency anemia, unspecified: Secondary | ICD-10-CM | POA: Diagnosis not present

## 2020-06-19 NOTE — Progress Notes (Signed)
HEMATOLOGY-ONCOLOGY TeleHEALTH VISIT PROGRESS NOTE  I connected with Jeanette Romero on 06/19/20 at  2:15 PM EDT by video enabled telemedicine visit and verified that I am speaking with the correct person using two identifiers. I discussed the limitations, risks, security and privacy concerns of performing an evaluation and management service by telemedicine and the availability of in-person appointments. I also discussed with the patient that there may be a patient responsible charge related to this service. The patient expressed understanding and agreed to proceed.   Other persons participating in the visit and their role in the encounter:  None  Patient's location: Home  Provider's location: office Chief Complaint: Anemia in chronic kidney disease   INTERVAL HISTORY Jeanette Romero is a 69 y.o. female who has above history reviewed by me today presents for follow up visit for management of anemia and chronic kidney disease Problems and complaints are listed below: I attempted to connect the patient for visual enabled telehealth visit.  Due to the technical difficulties with video,  Patient was transitioned to audio only visit. Patient has no new complaints today.  Fatigue level is at baseline.  She has had Retacrit injection yesterday.   Review of Systems  Constitutional: Positive for fatigue. Negative for appetite change, chills and fever.  HENT:   Negative for hearing loss and voice change.   Eyes: Negative for eye problems.  Respiratory: Negative for chest tightness and cough.   Cardiovascular: Negative for chest pain.  Gastrointestinal: Negative for abdominal distention, abdominal pain and blood in stool.  Endocrine: Negative for hot flashes.  Genitourinary: Negative for difficulty urinating and frequency.   Musculoskeletal: Negative for arthralgias.  Skin: Negative for itching and rash.  Neurological: Negative for extremity weakness.  Hematological: Negative for adenopathy.   Psychiatric/Behavioral: Negative for confusion.    Past Medical History:  Diagnosis Date  . Anemia in chronic kidney disease (CKD) 11/14/2017  . Anxiety   . Aortic valve regurgitation   . Aortic valve replaced   . Chronic kidney disease   . Coronary artery disease   . Dizziness   . Edema   . GERD (gastroesophageal reflux disease)   . Gout   . Hypercholesterolemia   . Hypertension   . Shortness of breath dyspnea    Past Surgical History:  Procedure Laterality Date  . ABDOMINAL HYSTERECTOMY    . AORTIC VALVE REPLACEMENT    . CARDIAC CATHETERIZATION    . CARDIAC CATHETERIZATION N/A 05/07/2015   Procedure: Left Heart Cath;  Surgeon: [name], MD;  Location: [place] [facility];  Service: Cardiovascular;  Laterality: N/A;  . CARDIAC VALVE REPLACEMENT    . COLONOSCOPY Left 02/20/2019   Procedure: COLONOSCOPY;  Surgeon: Virgel Manifold, MD;  Location: [facility] ENDOSCOPY;  Service: Endoscopy;  Laterality: Left;  . COLONOSCOPY N/A 02/21/2019   Procedure: COLONOSCOPY;  Surgeon: Virgel Manifold, MD;  Location: [facility] ENDOSCOPY;  Service: Endoscopy;  Laterality: N/A;  . ELECTROPHYSIOLOGIC STUDY N/A 05/05/2015   Procedure: Cardioversion;  Surgeon: [name], MD;  Location: [facility] ORS;  Service: Cardiovascular;  Laterality: N/A;  . ENTEROSCOPY N/A 02/21/2019   Procedure: ENTEROSCOPY;  Surgeon: Virgel Manifold, MD;  Location: [facility] ENDOSCOPY;  Service: Endoscopy;  Laterality: N/A;  . ESOPHAGOGASTRODUODENOSCOPY Left 02/19/2019   Procedure: ESOPHAGOGASTRODUODENOSCOPY (EGD);  Surgeon: Virgel Manifold, MD;  Location: [facility] ENDOSCOPY;  Service: Endoscopy;  Laterality: Left;  . REPLACEMENT TOTAL KNEE BILATERAL      Family History  Problem Relation Age of Onset  .  Hypertension Mother   . Colon cancer Mother   . Prostate cancer Father   . Kidney cancer Brother   . Prostate cancer Brother   . Prostate cancer Brother   . Breast cancer Neg Hx     Social History    Socioeconomic History  . Marital status: Married    Spouse name: Not on file  . Number of children: Not on file  . Years of education: Not on file  . Highest education level: Not on file  Occupational History  . Not on file  Tobacco Use  . Smoking status: Former Smoker    Packs/day: 0.50    Years: 20.00    Pack years: 10.00    Types: Cigarettes    Quit date: 2004    Years since quitting: 17.5  . Smokeless tobacco: Never Used  Vaping Use  . Vaping Use: Never used  Substance and Sexual Activity  . Alcohol use: Yes    Alcohol/week: 0.0 standard drinks  . Drug use: No  . Sexual activity: Not on file  Other Topics Concern  . Not on file  Social History Narrative  . Not on file   Social Determinants of Health   Financial Resource Strain:   . Difficulty of Paying Living Expenses:   Food Insecurity:   . Worried About Charity fundraiser in the Last Year:   . Arboriculturist in the Last Year:   Transportation Needs:   . Film/video editor (Medical):   Marland Kitchen Lack of Transportation (Non-Medical):   Physical Activity:   . Days of Exercise per Week:   . Minutes of Exercise per Session:   Stress:   . Feeling of Stress :   Social Connections:   . Frequency of Communication with Friends and Family:   . Frequency of Social Gatherings with Friends and Family:   . Attends Religious Services:   . Active Member of Clubs or Organizations:   . Attends Archivist Meetings:   Marland Kitchen Marital Status:   Intimate Partner Violence:   . Fear of Current or Ex-Partner:   . Emotionally Abused:   Marland Kitchen Physically Abused:   . Sexually Abused:     Current Outpatient Medications on File Prior to Visit  Medication Sig Dispense Refill  . ALPRAZolam (XANAX) 0.25 MG tablet Take 0.25 mg by mouth 2 (two) times daily.    Marland Kitchen amLODipine (NORVASC) 10 MG tablet Take 5 mg by mouth daily.     Marland Kitchen amLODipine (NORVASC) 5 MG tablet Take 5 mg by mouth daily.    Marland Kitchen amoxicillin (AMOXIL) 500 MG tablet Take  2,000 mg by mouth as needed. For dental procedures    . aspirin EC 81 MG tablet Take 81 mg by mouth daily.    . benazepril (LOTENSIN) 40 MG tablet Take 40 mg by mouth daily.    . cloNIDine (CATAPRES) 0.3 MG tablet Take 0.3 mg 3 (three) times daily by mouth.  2  . cyclobenzaprine (FLEXERIL) 5 MG tablet Take 1 tablet (5 mg total) by mouth 3 (three) times daily as needed for muscle spasms. 15 tablet 0  . folic acid (FOLVITE) 1 MG tablet TAKE 1 TABLET(1 MG) BY MOUTH DAILY 90 tablet 0  . furosemide (LASIX) 20 MG tablet Take 20-40 mg by mouth daily. Take one tablet on odd days and two tablets on even days    . hydrALAZINE (APRESOLINE) 100 MG tablet Take 100 mg by mouth 2 (two) times daily.    Marland Kitchen  pantoprazole (PROTONIX) 20 MG tablet Take 1 tablet (20 mg total) by mouth daily. 30 tablet 2  . potassium chloride (KLOR-CON) 10 MEQ tablet Take 10 mEq by mouth daily.    . rosuvastatin (CRESTOR) 5 MG tablet Take 5 mg by mouth at bedtime.  2  . sodium bicarbonate 650 MG tablet Take 650 mg by mouth 2 (two) times daily.    . SODIUM BICARBONATE, ANTACID, PO Take 10 mg by mouth.    . traMADol (ULTRAM) 50 MG tablet Take 1 tablet (50 mg total) by mouth every 12 (twelve) hours as needed. 12 tablet 0  . warfarin (COUMADIN) 4 MG tablet Take 4 mg by mouth daily.    Marland Kitchen warfarin (COUMADIN) 6 MG tablet Take 6 mg by mouth.    . potassium chloride (K-DUR) 10 MEQ tablet Take 10 mEq by mouth daily.      No current facility-administered medications on file prior to visit.    Allergies  Allergen Reactions  . Atorvastatin Other (See Comments)    Other reaction(s): Liver Disorder Elevated liver enzymes       Observations/Objective: Today's Vitals   06/19/20 1359  PainSc: 0-No pain   There is no height or weight on file to calculate BMI.  Physical Exam Labs are reviewed and discussed with patient.  CBC    Component Value Date/Time   WBC 4.8 06/18/2020 1106   RBC 3.10 (L) 06/18/2020 1106   HGB 9.1 (L) 06/18/2020  1106   HGB 8.4 (L) 03/10/2012 0457   HCT 28.6 (L) 06/18/2020 1106   HCT 25.2 (L) 03/10/2012 0457   PLT 197 06/18/2020 1106   PLT 179 03/10/2012 0457   MCV 92.3 06/18/2020 1106   MCV 88 03/10/2012 0457   MCH 29.4 06/18/2020 1106   MCHC 31.8 06/18/2020 1106   RDW 14.9 06/18/2020 1106   RDW 15.7 (H) 03/10/2012 0457   LYMPHSABS 0.9 06/18/2020 1106   LYMPHSABS 0.8 (L) 03/10/2012 0457   MONOABS 0.5 06/18/2020 1106   MONOABS 1.2 (H) 03/10/2012 0457   EOSABS 0.1 06/18/2020 1106   EOSABS 0.0 03/10/2012 0457   BASOSABS 0.0 06/18/2020 1106   BASOSABS 0.0 03/10/2012 0457    CMP     Component Value Date/Time   NA 142 06/18/2020 1106   NA 138 03/10/2012 0457   K 5.2 (H) 06/18/2020 1106   K 4.0 03/10/2012 0457   CL 108 06/18/2020 1106   CL 103 03/10/2012 0457   CO2 24 06/18/2020 1106   CO2 25 03/10/2012 0457   GLUCOSE 104 (H) 06/18/2020 1106   GLUCOSE 99 03/10/2012 0457   BUN 73 (H) 06/18/2020 1106   BUN 23 (H) 03/10/2012 0457   CREATININE 2.82 (H) 06/18/2020 1106   CREATININE 1.32 (H) 03/10/2012 0457   CALCIUM 9.2 06/18/2020 1106   CALCIUM 8.8 03/10/2012 0457   PROT 7.5 06/18/2020 1106   ALBUMIN 4.1 06/18/2020 1106   AST 15 06/18/2020 1106   ALT 12 06/18/2020 1106   ALKPHOS 83 06/18/2020 1106   BILITOT 0.5 06/18/2020 1106   GFRNONAA 16 (L) 06/18/2020 1106   GFRNONAA 43 (L) 03/10/2012 0457   GFRAA 19 (L) 06/18/2020 1106   GFRAA 53 (L) 03/10/2012 0457     Assessment and Plan: 1. Anemia of chronic renal failure, stage 4 (severe) ([place])   2. Encounter for ESA (erythropoietin stimulating agent) anemia management   3. Iron deficiency anemia, unspecified iron deficiency anemia type     #Anemia of chronic kidney disease, Labs are  reviewed and discussed with patient Hemoglobin has been stable at 9.1 today.  She has received Retacrit on 06/18/2020.  #Iron labs reviewed.  Ferritin level is at 133, decreased from 3 months ago. I discussed with patient that I recommend to increase  ferritin level to be above 200 to help Retacrit to work effectively. Patient has had IV Venofer treatments in the past. Plan IV Venofer in 1 to 2 weeks.  Follow-up with H&H in 4 weeks and 8 weeks +/-Retacrit  Follow-up in 3 months  I discussed the assessment and treatment plan with the patient. The patient was provided an opportunity to ask questions and all were answered. The patient agreed with the plan and demonstrated an understanding of the instructions.  The patient was advised to call back or seek an in-person evaluation if the symptoms worsen or if the condition fails to improve as anticipated.    Earlie Server, MD 06/19/2020 10:00 PM

## 2020-06-29 ENCOUNTER — Inpatient Hospital Stay: Payer: Medicare Other

## 2020-07-02 ENCOUNTER — Inpatient Hospital Stay: Payer: Medicare Other

## 2020-07-02 ENCOUNTER — Other Ambulatory Visit: Payer: Self-pay

## 2020-07-02 VITALS — BP 115/76 | HR 71 | Temp 97.9°F | Resp 16

## 2020-07-02 DIAGNOSIS — N184 Chronic kidney disease, stage 4 (severe): Secondary | ICD-10-CM

## 2020-07-02 MED ORDER — SODIUM CHLORIDE 0.9 % IV SOLN
INTRAVENOUS | Status: DC
  Filled 2020-07-02: qty 250

## 2020-07-02 MED ORDER — IRON SUCROSE 20 MG/ML IV SOLN
200.0000 mg | Freq: Once | INTRAVENOUS | Status: AC
  Administered 2020-07-02: 200 mg via INTRAVENOUS
  Filled 2020-07-02: qty 10

## 2020-07-20 ENCOUNTER — Other Ambulatory Visit: Payer: Medicare Other

## 2020-07-20 ENCOUNTER — Ambulatory Visit: Payer: Medicare Other

## 2020-07-21 ENCOUNTER — Ambulatory Visit: Payer: Medicare Other

## 2020-07-21 ENCOUNTER — Other Ambulatory Visit: Payer: Medicare Other

## 2020-07-23 ENCOUNTER — Inpatient Hospital Stay: Payer: Medicare Other

## 2020-07-23 ENCOUNTER — Other Ambulatory Visit: Payer: Self-pay

## 2020-07-23 ENCOUNTER — Inpatient Hospital Stay: Payer: Medicare Other | Attending: Oncology

## 2020-07-23 VITALS — BP 115/56 | HR 53

## 2020-07-23 DIAGNOSIS — D631 Anemia in chronic kidney disease: Secondary | ICD-10-CM | POA: Insufficient documentation

## 2020-07-23 DIAGNOSIS — N184 Chronic kidney disease, stage 4 (severe): Secondary | ICD-10-CM | POA: Diagnosis present

## 2020-07-23 LAB — HEMOGLOBIN AND HEMATOCRIT, BLOOD
HCT: 28.4 % — ABNORMAL LOW (ref 36.0–46.0)
Hemoglobin: 8.7 g/dL — ABNORMAL LOW (ref 12.0–15.0)

## 2020-07-23 MED ORDER — EPOETIN ALFA-EPBX 40000 UNIT/ML IJ SOLN: 60000.0000 [IU] | Freq: Once | INTRAMUSCULAR | Status: DC

## 2020-07-23 MED ORDER — EPOETIN ALFA-EPBX 10000 UNIT/ML IJ SOLN
20000.0000 [IU] | Freq: Once | INTRAMUSCULAR | Status: AC
  Administered 2020-07-23: 20000 [IU] via SUBCUTANEOUS
  Filled 2020-07-23: qty 2

## 2020-07-23 MED ORDER — EPOETIN ALFA-EPBX 40000 UNIT/ML IJ SOLN
40000.0000 [IU] | Freq: Once | INTRAMUSCULAR | Status: AC
  Administered 2020-07-23: 40000 [IU] via SUBCUTANEOUS
  Filled 2020-07-23: qty 1

## 2020-08-18 ENCOUNTER — Inpatient Hospital Stay: Payer: Medicare Other

## 2020-08-18 ENCOUNTER — Other Ambulatory Visit: Payer: Self-pay

## 2020-08-18 ENCOUNTER — Inpatient Hospital Stay: Payer: Medicare Other | Attending: Oncology

## 2020-08-18 VITALS — BP 109/56 | HR 66

## 2020-08-18 DIAGNOSIS — N184 Chronic kidney disease, stage 4 (severe): Secondary | ICD-10-CM

## 2020-08-18 DIAGNOSIS — D631 Anemia in chronic kidney disease: Secondary | ICD-10-CM | POA: Diagnosis present

## 2020-08-18 LAB — HEMOGLOBIN AND HEMATOCRIT, BLOOD
HCT: 29 % — ABNORMAL LOW (ref 36.0–46.0)
Hemoglobin: 8.8 g/dL — ABNORMAL LOW (ref 12.0–15.0)

## 2020-08-18 MED ORDER — EPOETIN ALFA-EPBX 10000 UNIT/ML IJ SOLN
20000.0000 [IU] | Freq: Once | INTRAMUSCULAR | Status: AC
  Administered 2020-08-18: 20000 [IU] via SUBCUTANEOUS

## 2020-08-18 MED ORDER — EPOETIN ALFA-EPBX 40000 UNIT/ML IJ SOLN: 20000.0000 [IU] | Freq: Once | INTRAMUSCULAR | Status: DC

## 2020-08-18 MED ORDER — EPOETIN ALFA-EPBX 40000 UNIT/ML IJ SOLN
20000.0000 [IU] | Freq: Once | INTRAMUSCULAR | Status: DC
  Filled 2020-08-18: qty 1

## 2020-08-18 MED ORDER — EPOETIN ALFA-EPBX 40000 UNIT/ML IJ SOLN
40000.0000 [IU] | Freq: Once | INTRAMUSCULAR | Status: AC
  Administered 2020-08-18: 40000 [IU] via SUBCUTANEOUS
  Filled 2020-08-18: qty 1

## 2020-08-18 MED ORDER — EPOETIN ALFA-EPBX 40000 UNIT/ML IJ SOLN: 60000.0000 [IU] | Freq: Once | INTRAMUSCULAR | Status: DC

## 2020-08-19 ENCOUNTER — Other Ambulatory Visit: Payer: Medicare Other

## 2020-08-19 ENCOUNTER — Ambulatory Visit: Payer: Medicare Other

## 2020-08-27 ENCOUNTER — Other Ambulatory Visit: Payer: Self-pay | Admitting: *Deleted

## 2020-08-27 MED ORDER — FOLIC ACID 1 MG PO TABS: ORAL_TABLET | ORAL | 0 refills | Status: DC

## 2020-09-16 ENCOUNTER — Telehealth: Payer: Self-pay | Admitting: Oncology

## 2020-09-16 NOTE — Telephone Encounter (Signed)
Pt called requesting to get her injection when she has her labs completed on 10/11. She is scheduled for follow-up with you on 10\13, but the inj appt coincides with it. Please advise.

## 2020-09-16 NOTE — Telephone Encounter (Signed)
Left message informing patient that we will keep the appts as scheduled.  She will be getting iron and ferritin along with CBC drawn on 10/11.  Will need the lab results prior to receiving inj to ensure no change in dose or need for insurance authorization.

## 2020-09-16 NOTE — Telephone Encounter (Signed)
Spoke with patient on this date who stated that she could not attend appts the week of 09-21-20 and wanted to reschedule to lab/injection day 1 and virtual visit with MD day 2. [facility] MD approved this request. Spoke with patient and rescheduled appts to the week of 09-28-20.

## 2020-09-21 ENCOUNTER — Inpatient Hospital Stay: Payer: Medicare Other

## 2020-09-23 ENCOUNTER — Ambulatory Visit: Payer: Medicare Other | Admitting: Oncology

## 2020-09-23 ENCOUNTER — Ambulatory Visit: Payer: Medicare Other

## 2020-09-29 ENCOUNTER — Inpatient Hospital Stay: Payer: Medicare Other

## 2020-09-29 ENCOUNTER — Other Ambulatory Visit: Payer: Self-pay

## 2020-09-29 ENCOUNTER — Inpatient Hospital Stay: Payer: Medicare Other | Attending: Oncology

## 2020-09-29 DIAGNOSIS — N184 Chronic kidney disease, stage 4 (severe): Secondary | ICD-10-CM | POA: Diagnosis present

## 2020-09-29 DIAGNOSIS — D631 Anemia in chronic kidney disease: Secondary | ICD-10-CM | POA: Insufficient documentation

## 2020-09-29 LAB — CBC WITH DIFFERENTIAL/PLATELET
Abs Immature Granulocytes: 0.01 10*3/uL (ref 0.00–0.07)
Basophils Absolute: 0 10*3/uL (ref 0.0–0.1)
Basophils Relative: 1 %
Eosinophils Absolute: 0.3 10*3/uL (ref 0.0–0.5)
Eosinophils Relative: 6 %
HCT: 27.9 % — ABNORMAL LOW (ref 36.0–46.0)
Hemoglobin: 8.6 g/dL — ABNORMAL LOW (ref 12.0–15.0)
Immature Granulocytes: 0 %
Lymphocytes Relative: 17 %
Lymphs Abs: 0.7 10*3/uL (ref 0.7–4.0)
MCH: 29.1 pg (ref 26.0–34.0)
MCHC: 30.8 g/dL (ref 30.0–36.0)
MCV: 94.3 fL (ref 80.0–100.0)
Monocytes Absolute: 0.5 10*3/uL (ref 0.1–1.0)
Monocytes Relative: 10 %
Neutro Abs: 2.9 10*3/uL (ref 1.7–7.7)
Neutrophils Relative %: 66 %
Platelets: 160 10*3/uL (ref 150–400)
RBC: 2.96 MIL/uL — ABNORMAL LOW (ref 3.87–5.11)
RDW: 15 % (ref 11.5–15.5)
WBC: 4.4 10*3/uL (ref 4.0–10.5)
nRBC: 0 % (ref 0.0–0.2)

## 2020-09-29 LAB — IRON AND TIBC
Iron: 65 ug/dL (ref 28–170)
Saturation Ratios: 21 % (ref 10.4–31.8)
TIBC: 308 ug/dL (ref 250–450)
UIBC: 243 ug/dL

## 2020-09-29 LAB — FERRITIN: Ferritin: 200 ng/mL (ref 11–307)

## 2020-09-29 MED ORDER — EPOETIN ALFA-EPBX 40000 UNIT/ML IJ SOLN: 60000.0000 [IU] | Freq: Once | INTRAMUSCULAR | Status: DC

## 2020-09-29 MED ORDER — EPOETIN ALFA-EPBX 10000 UNIT/ML IJ SOLN
20000.0000 [IU] | Freq: Once | INTRAMUSCULAR | Status: AC
  Administered 2020-09-29: 20000 [IU] via SUBCUTANEOUS

## 2020-09-29 MED ORDER — EPOETIN ALFA-EPBX 40000 UNIT/ML IJ SOLN
40000.0000 [IU] | Freq: Once | INTRAMUSCULAR | Status: AC
  Administered 2020-09-29: 40000 [IU] via SUBCUTANEOUS
  Filled 2020-09-29: qty 1

## 2020-09-29 MED ORDER — EPOETIN ALFA-EPBX 40000 UNIT/ML IJ SOLN
20000.0000 [IU] | Freq: Once | INTRAMUSCULAR | Status: DC
  Filled 2020-09-29: qty 1

## 2020-09-30 ENCOUNTER — Inpatient Hospital Stay (HOSPITAL_BASED_OUTPATIENT_CLINIC_OR_DEPARTMENT_OTHER): Payer: Medicare Other | Admitting: Oncology

## 2020-09-30 DIAGNOSIS — D631 Anemia in chronic kidney disease: Secondary | ICD-10-CM | POA: Diagnosis not present

## 2020-09-30 DIAGNOSIS — N184 Chronic kidney disease, stage 4 (severe): Secondary | ICD-10-CM | POA: Diagnosis not present

## 2020-09-30 NOTE — Progress Notes (Signed)
Patient denies new problems/concerns today.   °

## 2020-10-01 ENCOUNTER — Encounter: Payer: Self-pay | Admitting: Oncology

## 2020-10-01 NOTE — Progress Notes (Signed)
HEMATOLOGY-ONCOLOGY TeleHEALTH VISIT PROGRESS NOTE  I connected with Jeanette Romero on 10/01/20 at  2:30 PM EDT by video enabled telemedicine visit and verified that I am speaking with the correct person using two identifiers. I discussed the limitations, risks, security and privacy concerns of performing an evaluation and management service by telemedicine and the availability of in-person appointments. I also discussed with the patient that there may be a patient responsible charge related to this service. The patient expressed understanding and agreed to proceed.   Other persons participating in the visit and their role in the encounter:  None  Patient's location: Home  Provider's location: office Chief Complaint: Anemia in chronic kidney disease   INTERVAL HISTORY Jeanette Romero is a 69 y.o. female who has above history reviewed by me today presents for follow up visit for management of anemia and chronic kidney disease Problems and complaints are listed below: Patient denies any new complaints.  Chronic fatigue level is at baseline.  Patient has had Retacrit injection 60,000 units.  Denies any shortness of breath, chest pain, leg swelling.   Review of Systems  Constitutional: Positive for fatigue. Negative for appetite change, chills and fever.  HENT:   Negative for hearing loss and voice change.   Eyes: Negative for eye problems.  Respiratory: Negative for chest tightness and cough.   Cardiovascular: Negative for chest pain.  Gastrointestinal: Negative for abdominal distention, abdominal pain and blood in stool.  Endocrine: Negative for hot flashes.  Genitourinary: Negative for difficulty urinating and frequency.   Musculoskeletal: Negative for arthralgias.  Skin: Negative for itching and rash.  Neurological: Negative for extremity weakness.  Hematological: Negative for adenopathy.  Psychiatric/Behavioral: Negative for confusion.    Past Medical History:  Diagnosis Date   . Anemia in chronic kidney disease (CKD) 11/14/2017  . Anxiety   . Aortic valve regurgitation   . Aortic valve replaced   . Chronic kidney disease   . Coronary artery disease   . Dizziness   . Edema   . GERD (gastroesophageal reflux disease)   . Gout   . Hypercholesterolemia   . Hypertension   . Shortness of breath dyspnea    Past Surgical History:  Procedure Laterality Date  . ABDOMINAL HYSTERECTOMY    . AORTIC VALVE REPLACEMENT    . CARDIAC CATHETERIZATION    . CARDIAC CATHETERIZATION N/A 05/07/2015   Procedure: Left Heart Cath;  Surgeon: [name], MD;  Location: [place] [facility];  Service: Cardiovascular;  Laterality: N/A;  . CARDIAC VALVE REPLACEMENT    . COLONOSCOPY Left 02/20/2019   Procedure: COLONOSCOPY;  Surgeon: Virgel Manifold, MD;  Location: [facility] ENDOSCOPY;  Service: Endoscopy;  Laterality: Left;  . COLONOSCOPY N/A 02/21/2019   Procedure: COLONOSCOPY;  Surgeon: Virgel Manifold, MD;  Location: [facility] ENDOSCOPY;  Service: Endoscopy;  Laterality: N/A;  . ELECTROPHYSIOLOGIC STUDY N/A 05/05/2015   Procedure: Cardioversion;  Surgeon: [name], MD;  Location: [facility] ORS;  Service: Cardiovascular;  Laterality: N/A;  . ENTEROSCOPY N/A 02/21/2019   Procedure: ENTEROSCOPY;  Surgeon: Virgel Manifold, MD;  Location: [facility] ENDOSCOPY;  Service: Endoscopy;  Laterality: N/A;  . ESOPHAGOGASTRODUODENOSCOPY Left 02/19/2019   Procedure: ESOPHAGOGASTRODUODENOSCOPY (EGD);  Surgeon: Virgel Manifold, MD;  Location: [facility] ENDOSCOPY;  Service: Endoscopy;  Laterality: Left;  . REPLACEMENT TOTAL KNEE BILATERAL      Family History  Problem Relation Age of Onset  . Hypertension Mother   . Colon cancer Mother   . Prostate cancer Father   .  Kidney cancer Brother   . Prostate cancer Brother   . Prostate cancer Brother   . Breast cancer Neg Hx     Social History   Socioeconomic History  . Marital status: Married    Spouse name: Not on file  . Number of children: Not  on file  . Years of education: Not on file  . Highest education level: Not on file  Occupational History  . Not on file  Tobacco Use  . Smoking status: Former Smoker    Packs/day: 0.50    Years: 20.00    Pack years: 10.00    Types: Cigarettes    Quit date: 2004    Years since quitting: 17.8  . Smokeless tobacco: Never Used  Vaping Use  . Vaping Use: Never used  Substance and Sexual Activity  . Alcohol use: Yes    Alcohol/week: 0.0 standard drinks  . Drug use: No  . Sexual activity: Not on file  Other Topics Concern  . Not on file  Social History Narrative  . Not on file   Social Determinants of Health   Financial Resource Strain:   . Difficulty of Paying Living Expenses: Not on file  Food Insecurity:   . Worried About Charity fundraiser in the Last Year: Not on file  . Ran Out of Food in the Last Year: Not on file  Transportation Needs:   . Lack of Transportation (Medical): Not on file  . Lack of Transportation (Non-Medical): Not on file  Physical Activity:   . Days of Exercise per Week: Not on file  . Minutes of Exercise per Session: Not on file  Stress:   . Feeling of Stress : Not on file  Social Connections:   . Frequency of Communication with Friends and Family: Not on file  . Frequency of Social Gatherings with Friends and Family: Not on file  . Attends Religious Services: Not on file  . Active Member of Clubs or Organizations: Not on file  . Attends Archivist Meetings: Not on file  . Marital Status: Not on file  Intimate Partner Violence:   . Fear of Current or Ex-Partner: Not on file  . Emotionally Abused: Not on file  . Physically Abused: Not on file  . Sexually Abused: Not on file    Current Outpatient Medications on File Prior to Visit  Medication Sig Dispense Refill  . ALPRAZolam (XANAX) 0.25 MG tablet Take 0.25 mg by mouth 2 (two) times daily.    Marland Kitchen amoxicillin (AMOXIL) 500 MG tablet Take 2,000 mg by mouth as needed. For dental  procedures    . aspirin EC 81 MG tablet Take 81 mg by mouth daily.    . benazepril (LOTENSIN) 40 MG tablet Take 40 mg by mouth daily.    . cloNIDine (CATAPRES) 0.3 MG tablet Take 0.3 mg 3 (three) times daily by mouth.  2  . cyclobenzaprine (FLEXERIL) 5 MG tablet Take 1 tablet (5 mg total) by mouth 3 (three) times daily as needed for muscle spasms. 15 tablet 0  . folic acid (FOLVITE) 1 MG tablet TAKE 1 TABLET(1 MG) BY MOUTH DAILY 90 tablet 0  . furosemide (LASIX) 20 MG tablet Take 20-40 mg by mouth daily. Take one tablet on odd days and two tablets on even days    . hydrALAZINE (APRESOLINE) 100 MG tablet Take 100 mg by mouth 2 (two) times daily.    . pantoprazole (PROTONIX) 20 MG tablet Take 1 tablet (20 mg  total) by mouth daily. 30 tablet 2  . rosuvastatin (CRESTOR) 5 MG tablet Take 5 mg by mouth at bedtime.  2  . sodium bicarbonate 650 MG tablet Take 650 mg by mouth 2 (two) times daily.    Marland Kitchen warfarin (COUMADIN) 4 MG tablet Take 4 mg by mouth daily.    Marland Kitchen amLODipine (NORVASC) 10 MG tablet Take 10 mg by mouth daily.     Marland Kitchen amLODipine (NORVASC) 5 MG tablet Take 5 mg by mouth daily. (Patient not taking: Reported on 09/30/2020)    . potassium chloride (K-DUR) 10 MEQ tablet Take 10 mEq by mouth daily.     . potassium chloride (KLOR-CON) 10 MEQ tablet Take 10 mEq by mouth daily. (Patient not taking: Reported on 09/30/2020)    . SODIUM BICARBONATE, ANTACID, PO Take 10 mg by mouth. (Patient not taking: Reported on 09/30/2020)    . traMADol (ULTRAM) 50 MG tablet Take 1 tablet (50 mg total) by mouth every 12 (twelve) hours as needed. (Patient not taking: Reported on 09/30/2020) 12 tablet 0  . warfarin (COUMADIN) 6 MG tablet Take 6 mg by mouth. (Patient not taking: Reported on 09/30/2020)     No current facility-administered medications on file prior to visit.    Allergies  Allergen Reactions  . Atorvastatin Other (See Comments)    Other reaction(s): Liver Disorder Elevated liver enzymes        Observations/Objective: Today's Vitals   09/30/20 1328  PainSc: 0-No pain   There is no height or weight on file to calculate BMI.  Physical Exam Labs are reviewed and discussed with patient.  CBC    Component Value Date/Time   WBC 4.4 09/29/2020 1347   RBC 2.96 (L) 09/29/2020 1347   HGB 8.6 (L) 09/29/2020 1347   HGB 8.4 (L) 03/10/2012 0457   HCT 27.9 (L) 09/29/2020 1347   HCT 25.2 (L) 03/10/2012 0457   PLT 160 09/29/2020 1347   PLT 179 03/10/2012 0457   MCV 94.3 09/29/2020 1347   MCV 88 03/10/2012 0457   MCH 29.1 09/29/2020 1347   MCHC 30.8 09/29/2020 1347   RDW 15.0 09/29/2020 1347   RDW 15.7 (H) 03/10/2012 0457   LYMPHSABS 0.7 09/29/2020 1347   LYMPHSABS 0.8 (L) 03/10/2012 0457   MONOABS 0.5 09/29/2020 1347   MONOABS 1.2 (H) 03/10/2012 0457   EOSABS 0.3 09/29/2020 1347   EOSABS 0.0 03/10/2012 0457   BASOSABS 0.0 09/29/2020 1347   BASOSABS 0.0 03/10/2012 0457    CMP     Component Value Date/Time   NA 142 06/18/2020 1106   NA 138 03/10/2012 0457   K 5.2 (H) 06/18/2020 1106   K 4.0 03/10/2012 0457   CL 108 06/18/2020 1106   CL 103 03/10/2012 0457   CO2 24 06/18/2020 1106   CO2 25 03/10/2012 0457   GLUCOSE 104 (H) 06/18/2020 1106   GLUCOSE 99 03/10/2012 0457   BUN 73 (H) 06/18/2020 1106   BUN 23 (H) 03/10/2012 0457   CREATININE 2.82 (H) 06/18/2020 1106   CREATININE 1.32 (H) 03/10/2012 0457   CALCIUM 9.2 06/18/2020 1106   CALCIUM 8.8 03/10/2012 0457   PROT 7.5 06/18/2020 1106   ALBUMIN 4.1 06/18/2020 1106   AST 15 06/18/2020 1106   ALT 12 06/18/2020 1106   ALKPHOS 83 06/18/2020 1106   BILITOT 0.5 06/18/2020 1106   GFRNONAA 16 (L) 06/18/2020 1106   GFRNONAA 43 (L) 03/10/2012 0457   GFRAA 19 (L) 06/18/2020 1106   GFRAA 53 (L) 03/10/2012  [id number]     Assessment and Plan: 1. Anemia of chronic renal failure, stage 4 (severe) ([facility])     #Anemia of chronic kidney disease, Labs reviewed and discussed with patient.  Hemoglobin has decreased to 8.6, iron panel  showed ferritin level 200 and iron saturation 21. Continue erythropoietin replacement therapy using Retacrit.  She received 60,000 units recently. Hemoglobin has slightly decreased comparing to prior levels 3 months ago. I recommend patient to proceed with 1 more IV Venofer treatment and she declined. She prefers to increase erythropoietin injection dose and if not working she may reconsider Venofer. She is made aware about the erythropoietin may not work efficiently without good iron store. We will increase Retacrit to 70,000 units monthly. Follow-up with H&H in 4 weeks and 8 weeks +/-Retacrit  Follow-up in 3 months.  I offered her to have an in person visit.  Patient prefers to continue with very video visit and she is aware about the limitation of video visit.  She knows that she can call to change to in person visit if she changes her mind.  I discussed the assessment and treatment plan with the patient. The patient was provided an opportunity to ask questions and all were answered. The patient agreed with the plan and demonstrated an understanding of the instructions.  The patient was advised to call back or seek an in-person evaluation if the symptoms worsen or if the condition fails to improve as anticipated.    Earlie Server, MD 10/01/2020 1:07 PM

## 2020-10-29 ENCOUNTER — Other Ambulatory Visit: Payer: Self-pay

## 2020-10-29 ENCOUNTER — Other Ambulatory Visit: Payer: Self-pay | Admitting: Oncology

## 2020-10-29 ENCOUNTER — Inpatient Hospital Stay: Payer: Medicare Other

## 2020-10-29 ENCOUNTER — Inpatient Hospital Stay: Payer: Medicare Other | Attending: Oncology

## 2020-10-29 VITALS — BP 137/76 | HR 62

## 2020-10-29 DIAGNOSIS — N184 Chronic kidney disease, stage 4 (severe): Secondary | ICD-10-CM | POA: Insufficient documentation

## 2020-10-29 DIAGNOSIS — D631 Anemia in chronic kidney disease: Secondary | ICD-10-CM | POA: Diagnosis present

## 2020-10-29 LAB — HEMOGLOBIN AND HEMATOCRIT, BLOOD
HCT: 29.7 % — ABNORMAL LOW (ref 36.0–46.0)
Hemoglobin: 9.1 g/dL — ABNORMAL LOW (ref 12.0–15.0)

## 2020-10-29 MED ORDER — EPOETIN ALFA-EPBX 40000 UNIT/ML IJ SOLN
20000.0000 [IU] | Freq: Once | INTRAMUSCULAR | Status: DC
  Filled 2020-10-29: qty 1

## 2020-10-29 MED ORDER — EPOETIN ALFA-EPBX 10000 UNIT/ML IJ SOLN
20000.0000 [IU] | Freq: Once | INTRAMUSCULAR | Status: AC
  Administered 2020-10-29: 20000 [IU] via SUBCUTANEOUS
  Filled 2020-10-29: qty 2

## 2020-10-29 MED ORDER — EPOETIN ALFA-EPBX 40000 UNIT/ML IJ SOLN: 70000.0000 [IU] | Freq: Once | INTRAMUSCULAR | Status: DC

## 2020-10-29 MED ORDER — EPOETIN ALFA-EPBX 40000 UNIT/ML IJ SOLN
40000.0000 [IU] | Freq: Once | INTRAMUSCULAR | Status: AC
  Administered 2020-10-29: 40000 [IU] via SUBCUTANEOUS
  Filled 2020-10-29: qty 1

## 2020-10-29 NOTE — Progress Notes (Signed)
Per MD patient to get retacrit 60,000 today

## 2020-11-20 ENCOUNTER — Other Ambulatory Visit: Payer: Self-pay | Admitting: *Deleted

## 2020-11-20 MED ORDER — FOLIC ACID 1 MG PO TABS
ORAL_TABLET | ORAL | 3 refills | Status: DC
Start: 2020-11-20 — End: 2021-07-14

## 2020-11-26 ENCOUNTER — Inpatient Hospital Stay: Payer: Medicare Other

## 2020-11-26 ENCOUNTER — Inpatient Hospital Stay: Payer: Medicare Other | Attending: Oncology

## 2020-11-26 VITALS — BP 123/65 | HR 70

## 2020-11-26 DIAGNOSIS — D631 Anemia in chronic kidney disease: Secondary | ICD-10-CM | POA: Diagnosis present

## 2020-11-26 DIAGNOSIS — N184 Chronic kidney disease, stage 4 (severe): Secondary | ICD-10-CM | POA: Insufficient documentation

## 2020-11-26 LAB — HEMOGLOBIN AND HEMATOCRIT, BLOOD
HCT: 29.3 % — ABNORMAL LOW (ref 36.0–46.0)
Hemoglobin: 9.1 g/dL — ABNORMAL LOW (ref 12.0–15.0)

## 2020-11-26 MED ORDER — EPOETIN ALFA-EPBX 10000 UNIT/ML IJ SOLN
20000.0000 [IU] | Freq: Once | INTRAMUSCULAR | Status: AC
Start: 2020-11-26 — End: 2020-11-26
  Administered 2020-11-26: 14:00:00 20000 [IU] via SUBCUTANEOUS
  Filled 2020-11-26: qty 2

## 2020-11-26 MED ORDER — EPOETIN ALFA-EPBX 40000 UNIT/ML IJ SOLN
40000.0000 [IU] | Freq: Once | INTRAMUSCULAR | Status: AC
Start: 2020-11-26 — End: 2020-11-26
  Administered 2020-11-26: 14:00:00 40000 [IU] via SUBCUTANEOUS
  Filled 2020-11-26: qty 1

## 2020-11-26 MED ORDER — EPOETIN ALFA-EPBX 40000 UNIT/ML IJ SOLN: 60000.0000 [IU] | Freq: Once | INTRAMUSCULAR | Status: DC

## 2020-11-27 ENCOUNTER — Other Ambulatory Visit: Payer: Medicare Other

## 2020-11-27 ENCOUNTER — Ambulatory Visit: Payer: Medicare Other

## 2020-12-09 ENCOUNTER — Other Ambulatory Visit: Payer: Self-pay | Admitting: Family Medicine

## 2020-12-09 DIAGNOSIS — Z1231 Encounter for screening mammogram for malignant neoplasm of breast: Secondary | ICD-10-CM

## 2020-12-30 ENCOUNTER — Telehealth: Payer: Self-pay | Admitting: Oncology

## 2020-12-30 NOTE — Telephone Encounter (Signed)
Pt called and stated appointments for 1/24 and 1/25 need to be rescheduled. pt requests call back at [phone].

## 2020-12-30 NOTE — Telephone Encounter (Signed)
Done....  Pt is aware of her sched appts

## 2021-01-04 ENCOUNTER — Inpatient Hospital Stay: Payer: Medicare Other | Attending: Oncology

## 2021-01-04 DIAGNOSIS — N184 Chronic kidney disease, stage 4 (severe): Secondary | ICD-10-CM | POA: Diagnosis present

## 2021-01-04 DIAGNOSIS — Z87891 Personal history of nicotine dependence: Secondary | ICD-10-CM | POA: Insufficient documentation

## 2021-01-04 DIAGNOSIS — D631 Anemia in chronic kidney disease: Secondary | ICD-10-CM | POA: Diagnosis present

## 2021-01-04 LAB — CBC WITH DIFFERENTIAL/PLATELET
Abs Immature Granulocytes: 0.02 10*3/uL (ref 0.00–0.07)
Basophils Absolute: 0.1 10*3/uL (ref 0.0–0.1)
Basophils Relative: 1 %
Eosinophils Absolute: 0.2 10*3/uL (ref 0.0–0.5)
Eosinophils Relative: 4 %
HCT: 29.3 % — ABNORMAL LOW (ref 36.0–46.0)
Hemoglobin: 9.1 g/dL — ABNORMAL LOW (ref 12.0–15.0)
Immature Granulocytes: 0 %
Lymphocytes Relative: 16 %
Lymphs Abs: 0.8 10*3/uL (ref 0.7–4.0)
MCH: 29.1 pg (ref 26.0–34.0)
MCHC: 31.1 g/dL (ref 30.0–36.0)
MCV: 93.6 fL (ref 80.0–100.0)
Monocytes Absolute: 0.5 10*3/uL (ref 0.1–1.0)
Monocytes Relative: 10 %
Neutro Abs: 3.2 10*3/uL (ref 1.7–7.7)
Neutrophils Relative %: 69 %
Platelets: 163 10*3/uL (ref 150–400)
RBC: 3.13 MIL/uL — ABNORMAL LOW (ref 3.87–5.11)
RDW: 15.2 % (ref 11.5–15.5)
WBC: 4.7 10*3/uL (ref 4.0–10.5)
nRBC: 0 % (ref 0.0–0.2)

## 2021-01-04 LAB — COMPREHENSIVE METABOLIC PANEL
ALT: 11 U/L (ref 0–44)
AST: 16 U/L (ref 15–41)
Albumin: 4 g/dL (ref 3.5–5.0)
Alkaline Phosphatase: 75 U/L (ref 38–126)
Anion gap: 10 (ref 5–15)
BUN: 72 mg/dL — ABNORMAL HIGH (ref 8–23)
CO2: 22 mmol/L (ref 22–32)
Calcium: 9.3 mg/dL (ref 8.9–10.3)
Chloride: 106 mmol/L (ref 98–111)
Creatinine, Ser: 2.54 mg/dL — ABNORMAL HIGH (ref 0.44–1.00)
GFR, Estimated: 20 mL/min — ABNORMAL LOW (ref >60)
Glucose, Bld: 98 mg/dL (ref 70–99)
Potassium: 4.3 mmol/L (ref 3.5–5.1)
Sodium: 138 mmol/L (ref 135–145)
Total Bilirubin: 0.7 mg/dL (ref 0.3–1.2)
Total Protein: 7.1 g/dL (ref 6.5–8.1)

## 2021-01-04 LAB — IRON AND TIBC
Iron: 49 ug/dL (ref 28–170)
Saturation Ratios: 15 % (ref 10.4–31.8)
TIBC: 336 ug/dL (ref 250–450)
UIBC: 287 ug/dL

## 2021-01-04 LAB — FERRITIN: Ferritin: 137 ng/mL (ref 11–307)

## 2021-01-05 ENCOUNTER — Inpatient Hospital Stay (HOSPITAL_BASED_OUTPATIENT_CLINIC_OR_DEPARTMENT_OTHER): Payer: Medicare Other | Admitting: Oncology

## 2021-01-05 ENCOUNTER — Inpatient Hospital Stay: Payer: Medicare Other

## 2021-01-05 ENCOUNTER — Encounter: Payer: Self-pay | Admitting: Oncology

## 2021-01-05 VITALS — BP 145/76 | HR 48 | Temp 97.6°F | Resp 18 | Wt 179.2 lb

## 2021-01-05 DIAGNOSIS — D509 Iron deficiency anemia, unspecified: Secondary | ICD-10-CM

## 2021-01-05 DIAGNOSIS — D631 Anemia in chronic kidney disease: Secondary | ICD-10-CM | POA: Diagnosis not present

## 2021-01-05 DIAGNOSIS — D649 Anemia, unspecified: Secondary | ICD-10-CM

## 2021-01-05 DIAGNOSIS — N184 Chronic kidney disease, stage 4 (severe): Secondary | ICD-10-CM | POA: Diagnosis not present

## 2021-01-05 DIAGNOSIS — Z79899 Other long term (current) drug therapy: Secondary | ICD-10-CM

## 2021-01-05 MED ORDER — EPOETIN ALFA-EPBX 40000 UNIT/ML IJ SOLN: 60000.0000 [IU] | Freq: Once | INTRAMUSCULAR | Status: DC

## 2021-01-05 MED ORDER — EPOETIN ALFA-EPBX 10000 UNIT/ML IJ SOLN
20000.0000 [IU] | Freq: Once | INTRAMUSCULAR | Status: AC
Start: 2021-01-05 — End: 2021-01-05
  Administered 2021-01-05: 20000 [IU] via SUBCUTANEOUS
  Filled 2021-01-05: qty 2

## 2021-01-05 MED ORDER — EPOETIN ALFA-EPBX 40000 UNIT/ML IJ SOLN
40000.0000 [IU] | Freq: Once | INTRAMUSCULAR | Status: AC
Start: 2021-01-05 — End: 2021-01-05
  Administered 2021-01-05: 40000 [IU] via SUBCUTANEOUS
  Filled 2021-01-05: qty 1

## 2021-01-05 NOTE — Progress Notes (Signed)
Hematology/Oncology Follow up note [facility] [phone] [phone]   Patient Care Team: Maryland Pink, MD as PCP - General (Family Medicine) Earlie Server, MD as Consulting Physician (Hematology and Oncology) Earlie Server, MD as Consulting Physician (Hematology and Oncology) Earlie Server, MD as Consulting Physician (Hematology and Oncology)  REFERRING PROVIDER: Maryland Pink, MD  REASON FOR VISIT Follow up for treatment of anemia of chronic kidney disease.  HISTORY OF PRESENTING ILLNESS:  Jeanette Romero is a  70 y.o.  female with PMH listed below who was referred to me for evaluation of labile INR. She has extensive cardiac comorbidities including history of aortic mechanic valve replacement, aneurysm of aorta, PAF, Coronary artery disease. She was on Amiodarone and also had drug induced hyperthyroidism.  She was sent to me for evaluation of labile INR.  Extensive review of her medical records from primary care physician's office showed  10/27/2017 INR 5.9,  10/24/2017 INR 4.9 10/12/2017 INR 1.2 10/07/2017 INR 6  10/04/2017 INR 8.1   08/11/2017 INR 7.5  05/24/2017 INR 3.4 04/26/2017 INR 3.5 03/15/2017 INR 3.4 01/31/2017 INR 2.7  08/29/2017 Folate 2.8,  B12 825 and the patient was started on folic acid supplementation.  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 Amiodarone was stopped. And patient was referred to endocrinologist for evaluation. She was found out to have Graves' disease and was started on prednisone. Last TSH that was done in the [place] system showed a TSH of 4.8.  # 3/9-3/13/2020  hospitalized due to weakness and noted to have acute on chronic anemia, black tarry stool. Status post EGD showing minimal gastritis and possible Barrett esophagus.   Biopsy was not done due to elevated INR and recent bleed.  Colonoscopy showing 3 to 4 mm sized sessile polyps.  Biopsies were not done.  Patient received PRBC transfusions during her admission  # #Abnormal CT finding: Patient had CT abdomen pelvis without contrast on 06/28/2018 for evaluation of right lower abdomen swelling.  There were incidental findings of 2 cm soft tissue nodule in the left posterior mediastinum.  Questionable enlarged lymph nodes.  I have previously discussed with patient about need of repeating CT scan of chest to evaluate stability.  I have discussed with her on multiple occasion. Patient declined.   INTERVAL HISTORY 70 y.o. female  presents for follow-up for management of anemia of CKD.  Patient reports feeling okay today.   Chronic fatigue level is stable.  Patient is on anticoagulation.  No bleeding events. No new complaints.  Review of Systems  Constitutional: Positive for malaise/fatigue. Negative for chills, fever and weight loss.  HENT: Negative for sore throat.   Eyes: Negative for redness.  Respiratory: Negative for cough, shortness of breath and wheezing.   Cardiovascular: Negative for chest pain, palpitations and leg swelling.  Gastrointestinal: Negative for abdominal pain, blood in stool, nausea and vomiting.  Genitourinary: Negative for dysuria.  Musculoskeletal: Negative for myalgias.  Skin: Negative for rash.  Neurological: Negative for dizziness, tingling and tremors.  Endo/Heme/Allergies: Does not bruise/bleed easily.  Psychiatric/Behavioral: Negative for hallucinations.    MEDICAL HISTORY:  Past Medical History:  Diagnosis Date  . Anemia in chronic kidney disease (CKD) 11/14/2017  . Anxiety   . Aortic valve regurgitation   . Aortic valve replaced   . Chronic kidney disease   .  Coronary artery disease   . Dizziness   . Edema   . GERD (gastroesophageal reflux disease)   . Gout   . Hypercholesterolemia   . Hypertension   . Shortness of  breath dyspnea     SURGICAL HISTORY: Past Surgical History:  Procedure Laterality Date  . ABDOMINAL HYSTERECTOMY    . AORTIC VALVE REPLACEMENT    . CARDIAC CATHETERIZATION    . CARDIAC CATHETERIZATION N/A 05/07/2015   Procedure: Left Heart Cath;  Surgeon: [name], MD;  Location: [facility];  Service: Cardiovascular;  Laterality: N/A;  . CARDIAC VALVE REPLACEMENT    . COLONOSCOPY Left 02/20/2019   Procedure: COLONOSCOPY;  Surgeon: Virgel Manifold, MD;  Location[facility] ENDOSCOPY;  Service: Endoscopy;  Laterality: Left;  . COLONOSCOPY N/A 02/21/2019   Procedure: COLONOSCOPY;  Surgeon: Virgel Manifold, MD;  Location: [facility] ENDOSCOPY;  Service: Endoscopy;  Laterality: N/A;  . ELECTROPHYSIOLOGIC STUDY N/A 05/05/2015   Procedure: Cardioversion;  Surgeon: [name], MD;  Location: [facility] ORS;  Service: Cardiovascular;  Laterality: N/A;  . ENTEROSCOPY N/A 02/21/2019   Procedure: ENTEROSCOPY;  Surgeon: Virgel Manifold, MD;  Location: [facility] ENDOSCOPY;  Service: Endoscopy;  Laterality: N/A;  . ESOPHAGOGASTRODUODENOSCOPY Left 02/19/2019   Procedure: ESOPHAGOGASTRODUODENOSCOPY (EGD);  Surgeon: Virgel Manifold, MD;  Location: [facility] ENDOSCOPY;  Service: Endoscopy;  Laterality: Left;  . REPLACEMENT TOTAL KNEE BILATERAL      SOCIAL HISTORY: Social History   Socioeconomic History  . Marital status: Married    Spouse name: Not on file  . Number of children: Not on file  . Years of education: Not on file  . Highest education level: Not on file  Occupational History  . Not on file  Tobacco Use  . Smoking status: Former Smoker    Packs/day: 0.50    Years: 20.00    Pack years: 10.00    Types: Cigarettes    Quit date: 2004    Years since quitting: 18.0  . Smokeless tobacco: Never Used  Vaping Use  . Vaping Use: Never used  Substance and Sexual Activity  . Alcohol use: Yes    Alcohol/week: 0.0 standard drinks  . Drug use: No  . Sexual activity: Not on file   Other Topics Concern  . Not on file  Social History Narrative  . Not on file   Social Determinants of Health   Financial Resource Strain: Not on file  Food Insecurity: Not on file  Transportation Needs: Not on file  Physical Activity: Not on file  Stress: Not on file  Social Connections: Not on file  Intimate Partner Violence: Not on file    FAMILY HISTORY: Family History  Problem Relation Age of Onset  . Hypertension Mother   . Colon cancer Mother   . Prostate cancer Father   . Kidney cancer Brother   . Prostate cancer Brother   . Prostate cancer Brother   . Breast cancer Neg Hx     ALLERGIES:  is allergic to atorvastatin.  MEDICATIONS:  Current Outpatient Medications  Medication Sig Dispense Refill  . ALPRAZolam (XANAX) 0.25 MG tablet Take 0.25 mg by mouth 2 (two) times daily.    Marland Kitchen amLODipine (NORVASC) 10 MG tablet Take 10 mg by mouth daily.     Marland Kitchen amoxicillin (AMOXIL) 500 MG tablet Take 2,000 mg by mouth as needed. For dental procedures    . aspirin EC 81 MG tablet Take 81 mg by mouth daily.    . benazepril (  LOTENSIN) 40 MG tablet Take 40 mg by mouth daily.    . cloNIDine (CATAPRES) 0.3 MG tablet Take 0.3 mg 3 (three) times daily by mouth.  2  . folic acid (FOLVITE) 1 MG tablet TAKE 1 TABLET(1 MG) BY MOUTH DAILY 90 tablet 3  . furosemide (LASIX) 20 MG tablet Take 20-40 mg by mouth daily. Take one tablet on odd days and two tablets on even days    . hydrALAZINE (APRESOLINE) 100 MG tablet Take 100 mg by mouth 2 (two) times daily.    . pantoprazole (PROTONIX) 20 MG tablet Take 1 tablet (20 mg total) by mouth daily. 30 tablet 2  . rosuvastatin (CRESTOR) 5 MG tablet Take 5 mg by mouth at bedtime.  2  . sodium bicarbonate 650 MG tablet Take 650 mg by mouth 2 (two) times daily.    Marland Kitchen warfarin (COUMADIN) 4 MG tablet Take 4 mg by mouth daily.    Marland Kitchen amLODipine (NORVASC) 5 MG tablet Take 5 mg by mouth daily. (Patient not taking: No sig reported)    . cyclobenzaprine (FLEXERIL) 5  MG tablet Take 1 tablet (5 mg total) by mouth 3 (three) times daily as needed for muscle spasms. (Patient not taking: Reported on 01/05/2021) 15 tablet 0  . potassium chloride (K-DUR) 10 MEQ tablet Take 10 mEq by mouth daily.  (Patient not taking: Reported on 01/05/2021)    . potassium chloride (KLOR-CON) 10 MEQ tablet Take 10 mEq by mouth daily. (Patient not taking: No sig reported)    . SODIUM BICARBONATE, ANTACID, PO Take 10 mg by mouth. (Patient not taking: No sig reported)    . traMADol (ULTRAM) 50 MG tablet Take 1 tablet (50 mg total) by mouth every 12 (twelve) hours as needed. (Patient not taking: No sig reported) 12 tablet 0  . warfarin (COUMADIN) 6 MG tablet Take 6 mg by mouth. (Patient not taking: No sig reported)     No current facility-administered medications for this visit.     PHYSICAL EXAMINATION: ECOG PERFORMANCE STATUS: 1 - Symptomatic but completely ambulatory Vitals:   01/05/21 1347  BP: (!) 145/76  Pulse: (!) 48  Resp: 18  Temp: 97.6 F (36.4 C)   Filed Weights   01/05/21 1347  Weight: 179 lb 3.2 oz (81.3 kg)    Physical Exam Constitutional:      General: She is not in acute distress.    Appearance: She is not diaphoretic.  HENT:     Head: Normocephalic and atraumatic.     Nose: Nose normal.     Mouth/Throat:     Pharynx: No oropharyngeal exudate.  Eyes:     General: No scleral icterus.       Left eye: No discharge.     Pupils: Pupils are equal, round, and reactive to light.  Neck:     Vascular: No JVD.  Cardiovascular:     Rate and Rhythm: Normal rate. Rhythm irregular.     Heart sounds: Murmur heard.    Pulmonary:     Effort: Pulmonary effort is normal. No respiratory distress.     Breath sounds: No wheezing or rales.  Chest:     Chest wall: No tenderness.  Abdominal:     General: Bowel sounds are normal. There is no distension.     Palpations: Abdomen is soft. There is no mass.     Tenderness: There is no abdominal tenderness. There is no  rebound.  Musculoskeletal:        General: No  tenderness. Normal range of motion.     Cervical back: Normal range of motion and neck supple.  Lymphadenopathy:     Cervical: No cervical adenopathy.  Skin:    General: Skin is warm and dry.     Findings: No erythema or rash.  Neurological:     Mental Status: She is alert and oriented to person, place, and time.     Cranial Nerves: No cranial nerve deficit.     Motor: No abnormal muscle tone.     Coordination: Coordination normal.  Psychiatric:        Mood and Affect: Mood and affect normal.      LABORATORY DATA:  I have reviewed the data as listed Lab Results  Component Value Date   WBC 4.7 01/04/2021   HGB 9.1 (L) 01/04/2021   HCT 29.3 (L) 01/04/2021   MCV 93.6 01/04/2021   PLT 163 01/04/2021   Recent Labs    06/18/20 1106 01/04/21 1023  NA 142 138  K 5.2* 4.3  CL 108 106  CO2 24 22  GLUCOSE 104* 98  BUN 73* 72*  CREATININE 2.82* 2.54*  CALCIUM 9.2 9.3  GFRNONAA 16* 20*  GFRAA 19*  --   PROT 7.5 7.1  ALBUMIN 4.1 4.0  AST 15 16  ALT 12 11  ALKPHOS 83 75  BILITOT 0.5 0.7   Iron/TIBC/Ferritin/ %Sat    Component Value Date/Time   IRON 49 01/04/2021 1023   TIBC 336 01/04/2021 1023   FERRITIN 137 01/04/2021 1023   IRONPCTSAT 15 01/04/2021 1023    08/29/2017 Folate 2.8,  B12 825              01/02/2018 TSH 3.05                                                                                                                                                        RADIOGRAPHIC STUDIES: I have personally reviewed the radiological images as listed and agreed with the findings in the report. 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  Mild aneurysmal dilation of the proximal abdominal aorta 3.7 cm.  Femorofemoral crossover graft and right axillo femoral bypass graft noted.   Rounded fluid collection surrounds the distal aspect of the axillofemoral bypass graft.  Presumably postoperative seroma.  Moderate stool burden in the colon.  2 cm soft tissue nodule in the left posterior mediastinum presumably mildly enlarged lymph node.  This could be followed with a repeat CT chest in 6 months to assess stability.  Cardiomegaly.  CAD.  ASSESSMENT & PLAN:  1. Anemia of chronic renal failure, stage 4 (severe) ([place])   2. Encounter for ESA (erythropoietin stimulating agent) anemia management   3. Iron deficiency anemia, unspecified iron deficiency anemia type    #Anemia of chronic kidney disease Labs are reviewed and discussed with patient. Hemoglobin is 9.1, stable.  Proceed with Retacrit 60,000 unit today.   #History of GI bleeding due to chronic anticoagulation. Iron labs are reviewed, I recommend patient to receive IV venofer x 1 to further increase her iron store.  She prefers to schedule on Thursdays.   #History of folic acid deficiency, loud heart murmur.  continue folic acid supplementation.  All questions were answered. The patient knows to call the clinic with any problems questions or concerns. Orders Placed This Encounter  Procedures  . CBC with Differential/Platelet    Standing Status:   Future    Standing Expiration Date:   01/05/2022  . Iron and TIBC    Standing Status:   Future    Standing Expiration Date:   01/05/2022  . Ferritin    Standing Status:   Future    Standing Expiration Date:   01/05/2022  . Hematocrit    Standing Status:   Future    Standing Expiration Date:   01/05/2022  . Hemoglobin    Standing Status:   Future     Standing Expiration Date:   01/05/2022  . Hemoglobin    Standing Status:   Future    Standing Expiration Date:   01/05/2022  . Hematocrit    Standing Status:   Future    Standing Expiration Date:   01/05/2022    Return of visit: IV venofer x 1.  H&H in 4 weeks, 8 weeks, +/- retacrit, lab md in 12 weeks +/- retacrit.    Earlie Server, MD, PhD 01/05/21

## 2021-01-05 NOTE — Progress Notes (Signed)
Patient denies new problems/concerns today.   °

## 2021-01-07 ENCOUNTER — Other Ambulatory Visit: Payer: Self-pay

## 2021-01-07 ENCOUNTER — Ambulatory Visit
Admission: RE | Admit: 2021-01-07 | Discharge: 2021-01-07 | Disposition: A | Discharge status: Home / Self Care | Payer: Medicare Other | Source: Ambulatory Visit | Attending: Family Medicine | Admitting: Family Medicine

## 2021-01-07 DIAGNOSIS — Z1231 Encounter for screening mammogram for malignant neoplasm of breast: Secondary | ICD-10-CM | POA: Diagnosis not present

## 2021-01-13 ENCOUNTER — Other Ambulatory Visit: Payer: Self-pay | Admitting: Family Medicine

## 2021-01-13 DIAGNOSIS — N6310 Unspecified lump in the right breast, unspecified quadrant: Secondary | ICD-10-CM

## 2021-01-13 DIAGNOSIS — R928 Other abnormal and inconclusive findings on diagnostic imaging of breast: Secondary | ICD-10-CM

## 2021-01-19 ENCOUNTER — Ambulatory Visit: Payer: Medicare Other

## 2021-01-19 ENCOUNTER — Ambulatory Visit
Admission: RE | Admit: 2021-01-19 | Discharge: 2021-01-19 | Disposition: A | Discharge status: Home / Self Care | Payer: Medicare Other | Source: Ambulatory Visit | Attending: Family Medicine | Admitting: Family Medicine

## 2021-01-19 ENCOUNTER — Other Ambulatory Visit: Payer: Self-pay

## 2021-01-19 DIAGNOSIS — R928 Other abnormal and inconclusive findings on diagnostic imaging of breast: Secondary | ICD-10-CM | POA: Diagnosis not present

## 2021-01-19 DIAGNOSIS — N6310 Unspecified lump in the right breast, unspecified quadrant: Secondary | ICD-10-CM | POA: Diagnosis present

## 2021-01-21 ENCOUNTER — Inpatient Hospital Stay: Payer: Medicare Other | Attending: Oncology

## 2021-01-21 VITALS — BP 122/73 | HR 65 | Temp 96.0°F | Resp 18

## 2021-01-21 DIAGNOSIS — D631 Anemia in chronic kidney disease: Secondary | ICD-10-CM | POA: Diagnosis present

## 2021-01-21 DIAGNOSIS — N184 Chronic kidney disease, stage 4 (severe): Secondary | ICD-10-CM | POA: Diagnosis not present

## 2021-01-21 MED ORDER — SODIUM CHLORIDE 0.9 % IV SOLN
Freq: Once | INTRAVENOUS | Status: AC
  Filled 2021-01-21: qty 250

## 2021-01-21 MED ORDER — IRON SUCROSE 20 MG/ML IV SOLN
200.0000 mg | Freq: Once | INTRAVENOUS | Status: AC
  Administered 2021-01-21: 200 mg via INTRAVENOUS
  Filled 2021-01-21: qty 10

## 2021-01-21 NOTE — Progress Notes (Signed)
Patient tolerated infusion well. Patient and VSS. Discharged home  

## 2021-01-26 ENCOUNTER — Other Ambulatory Visit: Payer: Self-pay | Admitting: Family Medicine

## 2021-01-26 DIAGNOSIS — R928 Other abnormal and inconclusive findings on diagnostic imaging of breast: Secondary | ICD-10-CM

## 2021-01-26 DIAGNOSIS — N6310 Unspecified lump in the right breast, unspecified quadrant: Secondary | ICD-10-CM

## 2021-01-29 ENCOUNTER — Ambulatory Visit
Admission: RE | Admit: 2021-01-29 | Discharge: 2021-01-29 | Disposition: A | Discharge status: Home / Self Care | Payer: Medicare Other | Source: Ambulatory Visit | Attending: Family Medicine | Admitting: Family Medicine

## 2021-01-29 ENCOUNTER — Other Ambulatory Visit: Payer: Self-pay

## 2021-01-29 DIAGNOSIS — N6310 Unspecified lump in the right breast, unspecified quadrant: Secondary | ICD-10-CM | POA: Diagnosis present

## 2021-01-29 DIAGNOSIS — R928 Other abnormal and inconclusive findings on diagnostic imaging of breast: Secondary | ICD-10-CM

## 2021-01-29 DIAGNOSIS — D241 Benign neoplasm of right breast: Secondary | ICD-10-CM

## 2021-01-29 HISTORY — DX: Benign neoplasm of right breast: D24.1

## 2021-01-29 HISTORY — PX: BREAST BIOPSY: SHX20

## 2021-02-01 ENCOUNTER — Encounter: Payer: Self-pay | Admitting: *Deleted

## 2021-02-01 LAB — SURGICAL PATHOLOGY

## 2021-02-01 NOTE — Progress Notes (Addendum)
Received message from Electa Sniff, RN from [facility] of patients benign pathology but need for surgical consult.  I have the left the patient a message to return my call.  Patient returned my call.  She is scheduled to see Dr. Peyton Najjar on February 11, 2021 @ 8:15.

## 2021-02-04 ENCOUNTER — Inpatient Hospital Stay: Payer: Medicare Other

## 2021-02-04 VITALS — BP 136/70 | HR 50

## 2021-02-04 DIAGNOSIS — N184 Chronic kidney disease, stage 4 (severe): Secondary | ICD-10-CM

## 2021-02-04 DIAGNOSIS — D631 Anemia in chronic kidney disease: Secondary | ICD-10-CM

## 2021-02-04 LAB — HEMATOCRIT: HCT: 30.3 % — ABNORMAL LOW (ref 36.0–46.0)

## 2021-02-04 LAB — HEMOGLOBIN: Hemoglobin: 9.2 g/dL — ABNORMAL LOW (ref 12.0–15.0)

## 2021-02-04 MED ORDER — EPOETIN ALFA-EPBX 10000 UNIT/ML IJ SOLN
20000.0000 [IU] | Freq: Once | INTRAMUSCULAR | Status: AC
Start: 2021-02-04 — End: 2021-02-04
  Administered 2021-02-04: 20000 [IU] via SUBCUTANEOUS
  Filled 2021-02-04: qty 2

## 2021-02-04 MED ORDER — EPOETIN ALFA-EPBX 40000 UNIT/ML IJ SOLN
40000.0000 [IU] | Freq: Once | INTRAMUSCULAR | Status: AC
Start: 2021-02-04 — End: 2021-02-04
  Administered 2021-02-04: 40000 [IU] via SUBCUTANEOUS
  Filled 2021-02-04: qty 1

## 2021-02-04 MED ORDER — EPOETIN ALFA-EPBX 40000 UNIT/ML IJ SOLN: 60000.0000 [IU] | Freq: Once | INTRAMUSCULAR | Status: DC

## 2021-02-11 ENCOUNTER — Ambulatory Visit: Payer: Self-pay | Admitting: General Surgery
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
## 2021-02-12 ENCOUNTER — Ambulatory Visit: Payer: Self-pay | Admitting: General Surgery
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
## 2021-02-15 ENCOUNTER — Other Ambulatory Visit: Payer: Self-pay | Admitting: General Surgery

## 2021-02-15 DIAGNOSIS — D241 Benign neoplasm of right breast: Secondary | ICD-10-CM

## 2021-02-17 ENCOUNTER — Ambulatory Visit
Admission: RE | Admit: 2021-02-17 | Discharge: 2021-02-17 | Disposition: A | Discharge status: Home / Self Care | Payer: Medicare Other | Source: Ambulatory Visit | Attending: General Surgery | Admitting: General Surgery

## 2021-02-17 ENCOUNTER — Other Ambulatory Visit: Payer: Self-pay

## 2021-02-17 DIAGNOSIS — D241 Benign neoplasm of right breast: Secondary | ICD-10-CM | POA: Diagnosis present

## 2021-02-22 ENCOUNTER — Other Ambulatory Visit
Admission: RE | Admit: 2021-02-22 | Discharge: 2021-02-22 | Disposition: A | Discharge status: Home / Self Care | Payer: Medicare Other | Source: Ambulatory Visit | Attending: General Surgery | Admitting: General Surgery

## 2021-02-22 NOTE — Patient Instructions (Addendum)
Your procedure is scheduled on: 03/01/21 - Monday Report to the Registration Desk on the 1st floor of the [facility]. To find out your arrival time, please call [phone] between 1PM - 3PM on: 02/26/21- Friday  Report to [facility] 02/25/21 between 8am - 2pm for Covid Testing- valet parking available.  REMEMBER: Instructions that are not followed completely may result in serious medical risk, up to and including death; or upon the discretion of your surgeon and anesthesiologist your surgery may need to be rescheduled.  Do not eat food or drink any fluids after midnight the night before surgery.  No gum chewing, lozengers or hard candies.  TAKE THESE MEDICATIONS THE MORNING OF SURGERY WITH A SIP OF WATER:  - ALPRAZolam (XANAX) 0.25 MG tablet - amLODipine (NORVASC) 10 MG tablet - cloNIDine (CATAPRES) 0.3 MG tablet - hydrALAZINE (APRESOLINE) 100 MG tablet - sodium bicarbonate 650 MG tablet  Follow recommendations from Cardiologist, Pulmonologist or PCP regarding stopping Aspirin, Coumadin, Plavix, Eliquis, Pradaxa, or Pletal. Stop Coumadin and Aspirin starting 02/23/21.  One week prior to surgery: Stop Anti-inflammatories (NSAIDS) such as Advil, Aleve, Ibuprofen, Motrin, Naproxen, Naprosyn and Aspirin based products such as Excedrin, Goodys Powder, BC Powder.  Stop ANY OVER THE COUNTER supplements until after surgery.  No Alcohol for 24 hours before or after surgery.  No Smoking including e-cigarettes for 24 hours prior to surgery.  No chewable tobacco products for at least 6 hours prior to surgery.  No nicotine patches on the day of surgery.  Do not use any "recreational" drugs for at least a week prior to your surgery.  Please be advised that the combination of cocaine and anesthesia may have negative outcomes, up to and including death. If you test positive for cocaine, your surgery will be cancelled.  On the morning of surgery brush your teeth with toothpaste and water,  you may rinse your mouth with mouthwash if you wish. Do not swallow any toothpaste or mouthwash.  Do not wear jewelry, make-up, hairpins, clips or nail polish.  Do not wear lotions, powders, or perfumes.   Do not shave body from the neck down 48 hours prior to surgery just in case you cut yourself which could leave a site for infection.  Also, freshly shaved skin may become irritated if using the CHG soap.  Contact lenses, hearing aids and dentures may not be worn into surgery.  Do not bring valuables to the hospital. [facility] is not responsible for any missing/lost belongings or valuables.   Use CHG Soap or wipes as directed on instruction sheet.  Notify your doctor if there is any change in your medical condition (cold, fever, infection).  Wear comfortable clothing (specific to your surgery type) to the hospital.  Plan for stool softeners for home use; pain medications have a tendency to cause constipation. You can also help prevent constipation by eating foods high in fiber such as fruits and vegetables and drinking plenty of fluids as your diet allows.  After surgery, you can help prevent lung complications by doing breathing exercises.  Take deep breaths and cough every 1-2 hours. Your doctor may order a device called an Incentive Spirometer to help you take deep breaths. When coughing or sneezing, hold a pillow firmly against your incision with both hands. This is called "splinting." Doing this helps protect your incision. It also decreases belly discomfort.  If you are being admitted to the hospital overnight, leave your suitcase in the car. After surgery it may be  brought to your room.  If you are being discharged the day of surgery, you will not be allowed to drive home. You will need a responsible adult (18 years or older) to drive you home and stay with you that night.   If you are taking public transportation, you will need to have a responsible adult (18 years or  older) with you. Please confirm with your physician that it is acceptable to use public transportation.   Please call the [facility] at [phone] if you have any questions about these instructions.  Surgery Visitation Policy:  Patients undergoing a surgery or procedure may have one family member or support person with them as long as that person is not COVID-19 positive or experiencing its symptoms.  That person may remain in the waiting area during the procedure.  Inpatient Visitation:    Visiting hours are 7 a.m. to 8 p.m. Inpatients will be allowed two visitors daily. The visitors may change each day during the patient's stay. No visitors under the age of 29. Any visitor under the age of 35 must be accompanied by an adult. The visitor must pass COVID-19 screenings, use hand sanitizer when entering and exiting the patient's room and wear a mask at all times, including in the patient's room. Patients must also wear a mask when staff or their visitor are in the room. Masking is required regardless of vaccination status.

## 2021-02-23 ENCOUNTER — Encounter: Payer: Self-pay | Admitting: General Surgery

## 2021-02-23 NOTE — Progress Notes (Signed)
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 Clinical nursing staff has updated patient's PMH/PSHx, current medication list, and drug allergies/intolerances to ensure comprehensive history available to assist in medical decision making as it pertains to the aforementioned surgical procedure and anticipated anesthetic course.   Clinical Discussion:  Jeanette Romero is a 70 y.o. female who is submitted for pre-surgical anesthesia review and clearance prior to her undergoing the above procedure. Patient is a Former Smoker (10 pack years; quit 12/2002). Pertinent PMH includes: CAD, permanent atrial fibrillation, AVR, aortic atherosclerosis, AAA (s/p dissection), aortic pseudoaneurysm, HTN, HLD, SOB, GERD (on daily PPI), CKD-IV, anemia of chronic disease, anxiety (on BZO).  Patient is followed by cardiology Edwin Dada, MD). She was last seen in the cardiology clinic on 09/15/2020; notes reviewed.  At the time of her clinic visit, patient doing well overall from a cardiovascular standpoint.  She complains of exertional dyspnea with climbing stairs.  Any chest pain, she denied PND, orthopnea, palpitations, significant peripheral edema (edema that is present resolves at night with lower extremity elevation), vertiginous symptoms, or presyncope/syncope. Patient noted to be bradycardic in the office; ECG revealed slow A. fib.  Patient denied fatigue.  PMH  significant for CAD, spontaneous aortic dissection, and atrial fibrillation.  Spontaneous aortic dissection occurred in 2004, and shortly after patient underwent AVR for severe mitral valve stenosis.  Cardiac catheterization in 2016 revealed nonobstructive coronary disease. 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 CHA2DS2-VASc Score = 4. Patient chronically anticoagulated using warfarin; compliant with therapy.  Patient has failed DCCV procedures times two at that point.  Postprocedural TEE revealed normal left ventricular systolic function with an EF of >55%.  Last TTE performed on 12/14/2018 revealed normal left ventricular systolic function, severe LVH, moderate valvular insufficiency, and severe aortic valve (prosthetic) stenosis (mean gradient 42 mmHg).  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 Patient on GDMT for her HTN and HLD diagnoses.  Blood pressure well controlled at 126/68 on currently prescribed amlodipine, benazepril, clonidine, furosemide, and hydralazine therapies.  Patient is on a statin for her HLD. Functional capacity limited by exertional dyspnea and claudication pain.  No changes were made to patient's medication regimen.  Patient to follow-up with outpatient cardiology in 3 months or sooner if needed.  Patient scheduled to undergo excisional breast biopsy with radiofrequency tag placement on 03/01/2021 with Dr. Herbert Pun.  Given patient's past medical history significant for cardiovascular disease and intervention, presurgical cardiac clearance was sought by the PAT team.  Per cardiology, "this patient is optimized for surgery and may proceed with the planned procedural course with an ACCEPTABLE risk stratification".  Again, this patient is on daily anticoagulation  therapy.  Patient has been instructed on recommendations from her primary surgeon and cardiology for holding her warfarin and daily low-dose ASA for 5  days prior to her procedure. Cardiology requesting that anticoagulation therapy be restarted on THE NIGHT OF SURGERY. Patient has been instructed that her last dose of these medications will be on 02/23/2021.  Patient denies previous perioperative complications with anesthesia in the past. In review of the available records, it is noted that patient underwent a general anesthetic course here (ASA III) in 02/2019 without documented complications.   Vitals with BMI 02/04/2021 01/21/2021 01/21/2021  Height - - -  Weight - - -  BMI - - -  Systolic 277 412 878  Diastolic 70 73 76  Pulse 50 65 70    Providers/Specialists:   NOTE: Primary physician provider listed below. Patient may have been seen by APP or partner within same practice.   PROVIDER ROLE / SPECIALTY LAST Tanna Savoy, MD General Surgery  02/11/2021  [place] Pink, MD Primary Care Provider  02/09/2021  Cloretta Ned, MD Cardiology  09/15/2020  Earlie Server, MD Oncology  01/05/2021   Allergies:  Atorvastatin  Current Home Medications:   No current facility-administered medications for this encounter.   Marland Kitchen ALPRAZolam (XANAX) 0.25 MG tablet  . amLODipine (NORVASC) 10 MG tablet  . amoxicillin (AMOXIL) 500 MG tablet  . aspirin EC 81 MG tablet  . benazepril (LOTENSIN) 40 MG tablet  . cloNIDine (CATAPRES) 0.3 MG tablet  . folic acid (FOLVITE) 1 MG tablet  . furosemide (LASIX) 20 MG tablet  . hydrALAZINE (APRESOLINE) 100 MG tablet  . pantoprazole (PROTONIX) 20 MG tablet  . rosuvastatin (CRESTOR) 5 MG tablet  . sodium bicarbonate 650 MG tablet  . warfarin (COUMADIN) 4 MG tablet  . amLODipine (NORVASC) 5 MG tablet  . cyclobenzaprine (FLEXERIL) 5 MG tablet  . potassium chloride (K-DUR) 10 MEQ tablet  . potassium chloride (KLOR-CON) 10 MEQ tablet  . SODIUM  BICARBONATE, ANTACID, PO  . traMADol (ULTRAM) 50 MG tablet  . warfarin (COUMADIN) 6 MG tablet   History:   Past Medical History:  Diagnosis Date  . Anemia in chronic kidney disease (CKD) 11/14/2017  . Aneurysm of infrarenal abdominal aorta (HCC)    a.) measured 2.3 x 2.8 cm on 11/15/2019  . 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 Anxiety   . Aortic atherosclerosis ([place])   . Aortic valve regurgitation    severe; s/p AVR  . Aortic valve replaced   . CKD (chronic kidney disease) stage 4, GFR 15-29 ml/min (HCC)   . Coronary artery disease   . Current use of long term anticoagulation    warfarin; managed at [facility]  . Dizziness   . Edema   . GERD (gastroesophageal reflux disease)   . Gout   . Hypercholesterolemia   . Hypertension   . 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 Permanent atrial fibrillation ([place])   . Pseudoaneurysm of aorta (HCC)    a.) proximal; enhancing portion measured 4.6 x 3.8 x 3.2 cm on 11/15/2019  . PVD (peripheral vascular disease) (HCC)    s/p axillofemoral bypass  . Shortness of breath dyspnea   . 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 ABDOMINAL HYSTERECTOMY    . AORTIC VALVE REPLACEMENT    . BREAST BIOPSY Right 01/29/2021   [place] Bx, 6:00 vision clip, path pending   . BREAST BIOPSY Right 01/29/2021   [place] Bx, 8:00 Ribbon clip, path pending   . CARDIAC CATHETERIZATION    .  CARDIAC CATHETERIZATION N/A 05/07/2015   Procedure: Left Heart Cath;  Surgeon: [name], MD;  Location: [place] CV LAB;  Service: Cardiovascular;  Laterality: N/A;  . CARDIAC VALVE REPLACEMENT    . COLONOSCOPY Left 02/20/2019   Procedure: COLONOSCOPY;  Surgeon: Virgel Manifold, MD;  Location: [facility] ENDOSCOPY;  Service: Endoscopy;  Laterality: Left;  . COLONOSCOPY N/A 02/21/2019   Procedure: COLONOSCOPY;  Surgeon: Virgel Manifold, MD;  Location: [facility]  ENDOSCOPY;  Service: Endoscopy;  Laterality: N/A;  . ELECTROPHYSIOLOGIC STUDY N/A 05/05/2015   Procedure: Cardioversion;  Surgeon: [name], MD;  Location: [facility] ORS;  Service: Cardiovascular;  Laterality: N/A;  . ENTEROSCOPY N/A 02/21/2019   Procedure: ENTEROSCOPY;  Surgeon: Virgel Manifold, MD;  Location: [facility] ENDOSCOPY;  Service: Endoscopy;  Laterality: N/A;  . ESOPHAGOGASTRODUODENOSCOPY Left 02/19/2019   Procedure: ESOPHAGOGASTRODUODENOSCOPY (EGD);  Surgeon: Virgel Manifold, MD;  Location: [facility] ENDOSCOPY;  Service: Endoscopy;  Laterality: Left;  . REPLACEMENT TOTAL KNEE BILATERAL     Family History  Problem Relation Age of Onset  . Hypertension Mother   . Colon cancer Mother   . Prostate cancer Father   . Kidney cancer Brother   . Prostate cancer Brother   . Prostate cancer Brother   . Breast cancer Neg Hx    Social History   Tobacco Use  . Smoking status: Former Smoker    Packs/day: 0.50    Years: 20.00    Pack years: 10.00    Types: Cigarettes    Quit date: 2004    Years since quitting: 18.2  . Smokeless tobacco: Never Used  Vaping Use  . Vaping Use: Never used  Substance Use Topics  . Alcohol use: Yes    Alcohol/week: 0.0 standard drinks    Comment: occas  . Drug use: No    Pertinent Clinical Results:  LABS: Labs reviewed: Acceptable for surgery.   Lab Results  Component Value Date   WBC 4.7 01/04/2021   HGB 9.2 (L) 02/04/2021   HCT 30.3 (L) 02/04/2021   MCV 93.6 01/04/2021   PLT 163 01/04/2021   Lab Results  Component Value Date   NA 138 01/04/2021   K 4.3 01/04/2021   GLUCOSE 98 01/04/2021   BUN 72 (H) 01/04/2021   CREATININE 2.54 (H) 01/04/2021   AST 16 01/04/2021   ALT 11 01/04/2021   ALKPHOS 75 01/04/2021   BILITOT 0.7 01/04/2021    ECG: Date: 09/15/2020 Rate: 49 bpm Rhythm: atrial fibrillation; IVCD Intervals: QRS 122 ms. QTc 429 ms. ST segment and T wave changes: Nonspecific T wave abnormalities; IVCD present  Comparison:  Similar to previous tracing obtained on 12/14/2018 NOTE: Tracing obtained at [facility]; unable for review. Above based on cardiologist's interpretation.    IMAGING / PROCEDURES: BILATERAL ULTRASOUND ABI performed on 01/14/2020 1. RIGHT  Right ABIs not obtained due to vessel classification  Continuous-wave Doppler waveforms at the ankle of the posterior tibial artery were biphasic and monophasic at the dorsalis pedis artery.    Digit pressures consistent with healing. 2. LEFT:  Left ABIs not obtained due to vessel classification  Continuous-wave Doppler waveforms at the ankle of the posterior tibial artery were biphasic and monophasic at the dorsalis pedis artery.    Digit pressures consistent with healing.  BILATERAL CAROTID DOPPLER performed on 11/28/2019 1. RIGHT  ICA less than 50% stenosis by velocity criteria  CCA less than 50% stenosis velocity criteria  ECA less than 50% stenosis by velocity criteria  Antegrade vertebral  artery flow  No significant subclavian artery stenosis by velocity criteria 2. LEFT:  ICA less than 50% stenosis by velocity criteria  CCA less than 50% stenosis velocity criteria  ECA less than 50% stenosis by velocity criteria  Antegrade vertebral artery flow  No significant subclavian artery stenosis by velocity criteria  CT TAA ANGIOGRAM performed on 11/15/2019 1. Partially thrombosed pseudoaneurysm likely arising from the distal anastomosis of the ascending aortic graft. The overall size is unchanged  although the central enhancing portion appears slightly enlarged, likely due to differences in contrast phase compared to May 15, 2019 CT.  2. Stable type A aortic dissection extending from the aortic arch to the left common iliac artery.  3. Stable dilation of the aortic root with residual focal dissection in the root.  4. 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 5. Enhancing soft tissue para-aortic soft tissue  nodule at the hiatus of  uncertain significance. Increased in size compared to remote priors but stable compared to June 2020 study. Attention on follow-up.  6. Unchanged hypoenhancement of the left kidney with the left renal artery arising from the false lumen.    ECHOCARDIOGRAM performed on 12/14/2018 1. Atrial fibrillation at a controlled ventricular rate 2. Severe concentric LVH 3. Normal left ventricular size 4. Normal left ventricular systolic function; LVEF 02% 5. Normal right heart 6. Moderate tricuspid regurgitation 7. Moderate central mitral regurgitation 8. Mildly dilated aortic root (4.6 cm) 9. Mechanical aortic valve with severe prosthetic stenosis (mean gradient 42 mmHg). 10. True and false lumen seen in abdominal aorta  Impression and Plan:  KARIELLE DAVIDOW has been referred for pre-anesthesia review and clearance prior to her undergoing the planned anesthetic and procedural courses. Available labs, pertinent testing, and imaging results were personally reviewed by me. This patient has been appropriately cleared by cardiology with an overall ACCEPTABLE risk of significant perioperative cardiovascular complications.  Based on clinical review performed today (02/25/21), barring any significant acute changes in the patient's overall condition, it is anticipated that she will be able to proceed with the planned surgical intervention. Any acute changes in clinical condition may necessitate her procedure being postponed and/or cancelled. Pre-surgical instructions were reviewed with the patient during her PAT appointment and questions were fielded by PAT clinical staff.  Honor Loh, MSN, APRN, FNP-C, CEN [facility]  Peri-operative Services Nurse Practitioner Phone: [phone] 02/25/21 9:58 AM  NOTE: This note has been prepared using Dragon dictation software. Despite my best ability to proofread, there is always the potential that unintentional  transcriptional errors may still occur from this process.

## 2021-02-25 ENCOUNTER — Other Ambulatory Visit: Payer: Self-pay

## 2021-02-25 ENCOUNTER — Other Ambulatory Visit
Admission: RE | Admit: 2021-02-25 | Discharge: 2021-02-25 | Disposition: A | Discharge status: Home / Self Care | Payer: Medicare Other | Source: Ambulatory Visit | Attending: General Surgery | Admitting: General Surgery

## 2021-02-25 DIAGNOSIS — Z01812 Encounter for preprocedural laboratory examination: Secondary | ICD-10-CM | POA: Diagnosis present

## 2021-02-25 DIAGNOSIS — Z20822 Contact with and (suspected) exposure to covid-19: Secondary | ICD-10-CM | POA: Insufficient documentation

## 2021-02-25 LAB — SARS CORONAVIRUS 2 (TAT 6-24 HRS): SARS Coronavirus 2: NEGATIVE

## 2021-02-28 MED ORDER — SODIUM CHLORIDE 0.9 % IV SOLN: INTRAVENOUS | Status: DC

## 2021-02-28 MED ORDER — ORAL CARE MOUTH RINSE: 15.0000 mL | Freq: Once | OROMUCOSAL | Status: AC

## 2021-02-28 MED ORDER — CHLORHEXIDINE GLUCONATE 0.12 % MT SOLN: 15.0000 mL | Freq: Once | OROMUCOSAL | Status: AC

## 2021-02-28 MED ORDER — FAMOTIDINE 20 MG PO TABS: 20.0000 mg | ORAL_TABLET | Freq: Once | ORAL | Status: DC

## 2021-03-01 ENCOUNTER — Ambulatory Visit: Payer: Medicare Other | Admitting: Urgent Care

## 2021-03-01 ENCOUNTER — Other Ambulatory Visit: Payer: Self-pay

## 2021-03-01 ENCOUNTER — Ambulatory Visit
Admission: RE | Admit: 2021-03-01 | Discharge: 2021-03-01 | Disposition: A | Discharge status: Home / Self Care | Payer: Medicare Other | Attending: General Surgery | Admitting: General Surgery

## 2021-03-01 ENCOUNTER — Encounter: Payer: Self-pay | Admitting: General Surgery

## 2021-03-01 ENCOUNTER — Encounter
Admission: RE | Disposition: A | Discharge status: Home / Self Care | Payer: Self-pay | Source: Home / Self Care | Attending: General Surgery

## 2021-03-01 ENCOUNTER — Ambulatory Visit
Admission: RE | Admit: 2021-03-01 | Discharge: 2021-03-01 | Disposition: A | Discharge status: Home / Self Care | Payer: Medicare Other | Source: Ambulatory Visit | Attending: General Surgery | Admitting: General Surgery

## 2021-03-01 DIAGNOSIS — D631 Anemia in chronic kidney disease: Secondary | ICD-10-CM | POA: Insufficient documentation

## 2021-03-01 DIAGNOSIS — Z801 Family history of malignant neoplasm of trachea, bronchus and lung: Secondary | ICD-10-CM | POA: Insufficient documentation

## 2021-03-01 DIAGNOSIS — N6021 Fibroadenosis of right breast: Secondary | ICD-10-CM | POA: Diagnosis not present

## 2021-03-01 DIAGNOSIS — Z79899 Other long term (current) drug therapy: Secondary | ICD-10-CM | POA: Diagnosis not present

## 2021-03-01 DIAGNOSIS — I129 Hypertensive chronic kidney disease with stage 1 through stage 4 chronic kidney disease, or unspecified chronic kidney disease: Secondary | ICD-10-CM | POA: Diagnosis not present

## 2021-03-01 DIAGNOSIS — D241 Benign neoplasm of right breast: Secondary | ICD-10-CM | POA: Insufficient documentation

## 2021-03-01 DIAGNOSIS — N184 Chronic kidney disease, stage 4 (severe): Secondary | ICD-10-CM | POA: Diagnosis not present

## 2021-03-01 DIAGNOSIS — I4821 Permanent atrial fibrillation: Secondary | ICD-10-CM | POA: Insufficient documentation

## 2021-03-01 DIAGNOSIS — Z87891 Personal history of nicotine dependence: Secondary | ICD-10-CM | POA: Insufficient documentation

## 2021-03-01 DIAGNOSIS — Z8042 Family history of malignant neoplasm of prostate: Secondary | ICD-10-CM | POA: Diagnosis not present

## 2021-03-01 DIAGNOSIS — Z800 Family history of malignant neoplasm of digestive organs: Secondary | ICD-10-CM | POA: Diagnosis not present

## 2021-03-01 DIAGNOSIS — Z8249 Family history of ischemic heart disease and other diseases of the circulatory system: Secondary | ICD-10-CM | POA: Insufficient documentation

## 2021-03-01 DIAGNOSIS — Z888 Allergy status to other drugs, medicaments and biological substances status: Secondary | ICD-10-CM | POA: Diagnosis not present

## 2021-03-01 HISTORY — PX: EXCISION OF BREAST BIOPSY: SHX5822

## 2021-03-01 HISTORY — DX: Aneurysm of iliac artery: I72.3

## 2021-03-01 HISTORY — DX: Atherosclerosis of aorta: I70.0

## 2021-03-01 HISTORY — DX: Permanent atrial fibrillation: I48.21

## 2021-03-01 HISTORY — DX: Peripheral vascular disease, unspecified: I73.9

## 2021-03-01 HISTORY — DX: Abdominal aortic aneurysm, without rupture: I71.4

## 2021-03-01 HISTORY — DX: Infrarenal abdominal aortic aneurysm, without rupture: I71.43

## 2021-03-01 HISTORY — DX: Aortic aneurysm of unspecified site, without rupture: I71.9

## 2021-03-01 HISTORY — DX: Chronic kidney disease, stage 4 (severe): N18.4

## 2021-03-01 HISTORY — DX: Long term (current) use of anticoagulants: Z79.01

## 2021-03-01 LAB — POCT I-STAT, CHEM 8
BUN: 84 mg/dL — ABNORMAL HIGH (ref 8–23)
Calcium, Ion: 1.26 mmol/L (ref 1.15–1.40)
Chloride: 108 mmol/L (ref 98–111)
Creatinine, Ser: 3 mg/dL — ABNORMAL HIGH (ref 0.44–1.00)
Glucose, Bld: 104 mg/dL — ABNORMAL HIGH (ref 70–99)
HCT: 27 % — ABNORMAL LOW (ref 36.0–46.0)
Hemoglobin: 9.2 g/dL — ABNORMAL LOW (ref 12.0–15.0)
Potassium: 4.4 mmol/L (ref 3.5–5.1)
Sodium: 142 mmol/L (ref 135–145)
TCO2: 22 mmol/L (ref 22–32)

## 2021-03-01 LAB — PROTIME-INR
INR: 1.2 (ref 0.8–1.2)
Prothrombin Time: 14.4 seconds (ref 11.4–15.2)

## 2021-03-01 SURGERY — EXCISION OF BREAST BIOPSY
Anesthesia: General | Site: Breast | Laterality: Right

## 2021-03-01 MED ORDER — MIDAZOLAM HCL 2 MG/2ML IJ SOLN
INTRAMUSCULAR | Status: AC
  Filled 2021-03-01: qty 2

## 2021-03-01 MED ORDER — ACETAMINOPHEN 10 MG/ML IV SOLN
INTRAVENOUS | Status: DC | PRN
  Administered 2021-03-01: 1000 mg via INTRAVENOUS

## 2021-03-01 MED ORDER — FENTANYL CITRATE (PF) 100 MCG/2ML IJ SOLN
25.0000 ug | INTRAMUSCULAR | Status: DC | PRN
  Administered 2021-03-01: 25 ug via INTRAVENOUS

## 2021-03-01 MED ORDER — PROMETHAZINE HCL 25 MG/ML IJ SOLN: 6.2500 mg | INTRAMUSCULAR | Status: DC | PRN

## 2021-03-01 MED ORDER — FENTANYL CITRATE (PF) 100 MCG/2ML IJ SOLN
INTRAMUSCULAR | Status: DC | PRN
  Administered 2021-03-01 (×2): 50 ug via INTRAVENOUS

## 2021-03-01 MED ORDER — FENTANYL CITRATE (PF) 100 MCG/2ML IJ SOLN
INTRAMUSCULAR | Status: AC
  Filled 2021-03-01: qty 2

## 2021-03-01 MED ORDER — HYDROCODONE-ACETAMINOPHEN 5-325 MG PO TABS: 1.0000 | ORAL_TABLET | ORAL | 0 refills | Status: AC | PRN

## 2021-03-01 MED ORDER — EPHEDRINE 5 MG/ML INJ
INTRAVENOUS | Status: AC
  Filled 2021-03-01: qty 10

## 2021-03-01 MED ORDER — LIDOCAINE HCL (CARDIAC) PF 100 MG/5ML IV SOSY
PREFILLED_SYRINGE | INTRAVENOUS | Status: DC | PRN
  Administered 2021-03-01: 80 mg via INTRAVENOUS

## 2021-03-01 MED ORDER — DEXAMETHASONE SODIUM PHOSPHATE 10 MG/ML IJ SOLN
INTRAMUSCULAR | Status: DC | PRN
  Administered 2021-03-01: 10 mg via INTRAVENOUS

## 2021-03-01 MED ORDER — PHENYLEPHRINE HCL (PRESSORS) 10 MG/ML IV SOLN
INTRAVENOUS | Status: DC | PRN
  Administered 2021-03-01 (×5): 100 ug via INTRAVENOUS

## 2021-03-01 MED ORDER — BUPIVACAINE-EPINEPHRINE (PF) 0.5% -1:200000 IJ SOLN
INTRAMUSCULAR | Status: DC | PRN
  Administered 2021-03-01: 10 mL
  Administered 2021-03-01: 20 mL

## 2021-03-01 MED ORDER — PROPOFOL 10 MG/ML IV BOLUS
INTRAVENOUS | Status: AC
  Filled 2021-03-01: qty 20

## 2021-03-01 MED ORDER — CEFAZOLIN SODIUM-DEXTROSE 2-4 GM/100ML-% IV SOLN
2.0000 g | INTRAVENOUS | Status: AC
  Administered 2021-03-01: 2 g via INTRAVENOUS

## 2021-03-01 MED ORDER — OXYCODONE HCL 5 MG/5ML PO SOLN: 5.0000 mg | Freq: Once | ORAL | Status: DC | PRN

## 2021-03-01 MED ORDER — ONDANSETRON HCL 4 MG/2ML IJ SOLN
INTRAMUSCULAR | Status: AC
  Filled 2021-03-01: qty 2

## 2021-03-01 MED ORDER — CEFAZOLIN SODIUM-DEXTROSE 2-4 GM/100ML-% IV SOLN
INTRAVENOUS | Status: AC
  Filled 2021-03-01: qty 100

## 2021-03-01 MED ORDER — ACETAMINOPHEN 10 MG/ML IV SOLN
INTRAVENOUS | Status: AC
  Filled 2021-03-01: qty 100

## 2021-03-01 MED ORDER — MEPERIDINE HCL 50 MG/ML IJ SOLN: 6.2500 mg | INTRAMUSCULAR | Status: DC | PRN

## 2021-03-01 MED ORDER — HEMOSTATIC AGENTS (NO CHARGE) OPTIME
TOPICAL | Status: DC | PRN
  Administered 2021-03-01: 1 via TOPICAL

## 2021-03-01 MED ORDER — PROPOFOL 10 MG/ML IV BOLUS
INTRAVENOUS | Status: DC | PRN
  Administered 2021-03-01: 120 mg via INTRAVENOUS
  Administered 2021-03-01: 20 mg via INTRAVENOUS

## 2021-03-01 MED ORDER — LIDOCAINE HCL (PF) 2 % IJ SOLN
INTRAMUSCULAR | Status: AC
  Filled 2021-03-01: qty 5

## 2021-03-01 MED ORDER — OXYCODONE HCL 5 MG PO TABS
5.0000 mg | ORAL_TABLET | Freq: Once | ORAL | Status: DC | PRN
Start: 2021-03-01 — End: 2021-03-01

## 2021-03-01 MED ORDER — EPHEDRINE SULFATE 50 MG/ML IJ SOLN
INTRAMUSCULAR | Status: DC | PRN
  Administered 2021-03-01 (×2): 5 mg via INTRAVENOUS
  Administered 2021-03-01: 10 mg via INTRAVENOUS

## 2021-03-01 MED ORDER — CHLORHEXIDINE GLUCONATE 0.12 % MT SOLN
OROMUCOSAL | Status: AC
  Administered 2021-03-01: 15 mL via OROMUCOSAL
  Filled 2021-03-01: qty 15

## 2021-03-01 MED ORDER — GLYCOPYRROLATE 0.2 MG/ML IJ SOLN
INTRAMUSCULAR | Status: DC | PRN
  Administered 2021-03-01: .2 mg via INTRAVENOUS

## 2021-03-01 MED ORDER — ONDANSETRON HCL 4 MG/2ML IJ SOLN
INTRAMUSCULAR | Status: DC | PRN
  Administered 2021-03-01: 4 mg via INTRAVENOUS

## 2021-03-01 SURGICAL SUPPLY — 45 items
ADH SKN CLS APL DERMABOND .7 (GAUZE/BANDAGES/DRESSINGS) ×1
APL PRP STRL LF DISP 70% ISPRP (MISCELLANEOUS) ×1
BLADE SURG 15 STRL LF DISP TIS (BLADE) ×1 IMPLANT
BLADE SURG 15 STRL SS (BLADE) ×2
CHLORAPREP W/TINT 26 (MISCELLANEOUS) ×2 IMPLANT
CNTNR SPEC 2.5X3XGRAD LEK (MISCELLANEOUS) ×1
CONT SPEC 4OZ STER OR WHT (MISCELLANEOUS) ×1
CONT SPEC 4OZ STRL OR WHT (MISCELLANEOUS) ×1
CONTAINER SPEC 2.5X3XGRAD LEK (MISCELLANEOUS) ×1 IMPLANT
COVER WAND RF STERILE (DRAPES) ×2 IMPLANT
DERMABOND ADVANCED (GAUZE/BANDAGES/DRESSINGS) ×1
DERMABOND ADVANCED .7 DNX12 (GAUZE/BANDAGES/DRESSINGS) ×1 IMPLANT
DEVICE DUBIN SPECIMEN MAMMOGRA (MISCELLANEOUS) ×2 IMPLANT
DRAPE LAPAROTOMY TRNSV 106X77 (MISCELLANEOUS) ×2 IMPLANT
ELECT CAUTERY BLADE TIP 2.5 (TIP) ×2
ELECT REM PT RETURN 9FT ADLT (ELECTROSURGICAL) ×2
ELECTRODE CAUTERY BLDE TIP 2.5 (TIP) ×1 IMPLANT
ELECTRODE REM PT RTRN 9FT ADLT (ELECTROSURGICAL) ×1 IMPLANT
GLOVE SURG ENC MOIS LTX SZ6.5 (GLOVE) ×2 IMPLANT
GLOVE SURG UNDER POLY LF SZ6.5 (GLOVE) ×2 IMPLANT
GOWN STRL REUS W/ TWL LRG LVL3 (GOWN DISPOSABLE) ×3 IMPLANT
GOWN STRL REUS W/TWL LRG LVL3 (GOWN DISPOSABLE) ×6
KIT MARKER MARGIN INK (KITS) ×1 IMPLANT
KIT TURNOVER KIT A (KITS) ×2 IMPLANT
LABEL OR SOLS (LABEL) ×2 IMPLANT
MANIFOLD NEPTUNE II (INSTRUMENTS) ×2 IMPLANT
MARGIN MAP 10MM (MISCELLANEOUS) ×1 IMPLANT
MARKER MARGIN CORRECT CLIP (MARKER) ×1 IMPLANT
NDL HYPO 25X1 1.5 SAFETY (NEEDLE) ×1 IMPLANT
NEEDLE HYPO 25X1 1.5 SAFETY (NEEDLE) ×2 IMPLANT
PACK BASIN MINOR ARMC (MISCELLANEOUS) ×2 IMPLANT
RETRACTOR RING XSMALL (MISCELLANEOUS) IMPLANT
RTRCTR WOUND ALEXIS 13CM XS SH (MISCELLANEOUS) ×2
SET LOCALIZER 20 PROBE US (MISCELLANEOUS) ×2 IMPLANT
SUT ETHILON 3-0 FS-10 30 BLK (SUTURE)
SUT MNCRL 4-0 (SUTURE) ×2
SUT MNCRL 4-0 27XMFL (SUTURE) ×1
SUT SILK 2 0 SH (SUTURE) ×2 IMPLANT
SUT VIC AB 3-0 SH 27 (SUTURE) ×2
SUT VIC AB 3-0 SH 27X BRD (SUTURE) ×1 IMPLANT
SUTURE EHLN 3-0 FS-10 30 BLK (SUTURE) IMPLANT
SUTURE MNCRL 4-0 27XMF (SUTURE) ×1 IMPLANT
SYR 10ML LL (SYRINGE) ×2 IMPLANT
SYR BULB IRRIG 60ML STRL (SYRINGE) ×2 IMPLANT
WATER STERILE IRR 1000ML POUR (IV SOLUTION) ×2 IMPLANT

## 2021-03-01 NOTE — Discharge Instructions (Addendum)
  Diet: Resume home heart healthy regular diet.   Activity: Increase activity as tolerated. Light activity and walking are encouraged. Do not drive or drink alcohol if taking narcotic pain medications.  Wound care: May shower with soapy water and pat dry (do not rub incisions), but no baths or submerging incision underwater until follow-up. (no swimming)   Medications: Resume all home medications. For mild to moderate pain: acetaminophen (Tylenol) or ibuprofen (if no kidney disease). Combining Tylenol with alcohol can substantially increase your risk of causing liver disease. Narcotic pain medications, if prescribed, can be used for severe pain, though may cause nausea, constipation, and drowsiness. Do not combine Tylenol and Norco within a 6 hour period as Norco contains Tylenol. If you do not need the narcotic pain medication, you do not need to fill the prescription.  (TYLENOL 1,000 MG GIVEN AT [place] AM)  Call office [phone]) at any time if any questions, worsening pain, fevers/chills, bleeding, drainage from incision site, or other concerns.   AMBULATORY SURGERY  DISCHARGE INSTRUCTIONS   1) The drugs that you were given will stay in your system until tomorrow so for the next 24 hours you should not:  A) Drive an automobile B) Make any legal decisions C) Drink any alcoholic beverage   2) You may resume regular meals tomorrow.  Today it is better to start with liquids and gradually work up to solid foods.  You may eat anything you prefer, but it is better to start with liquids, then soup and crackers, and gradually work up to solid foods.   3) Please notify your doctor immediately if you have any unusual bleeding, trouble breathing, redness and pain at the surgery site, drainage, fever, or pain not relieved by medication.    4) Additional Instructions:        Please contact your physician with any problems or Same Day Surgery at [phone], Monday  through Friday 6 am to 4 pm, or [place] at [facility] number at [phone].

## 2021-03-01 NOTE — Transfer of Care (Signed)
Immediate Anesthesia Transfer of Care Note  Patient: Jeanette Romero  Procedure(s) Performed: EXCISION OF BREAST BIOPSY w/ radiofrequency tag (Right Breast)  Patient Location: PACU  Anesthesia Type:General  Level of Consciousness: awake, alert  and oriented  Airway & Oxygen Therapy: Patient Spontanous Breathing and Patient connected to face mask oxygen  Post-op Assessment: Report given to RN and Post -op Vital signs reviewed and stable  Post vital signs: Reviewed and stable  Last Vitals:  Vitals Value Taken Time  BP 110/57 03/01/21 0840  Temp    Pulse 82 03/01/21 0843  Resp 15 03/01/21 0843  SpO2 99 % 03/01/21 0843  Vitals shown include unvalidated device data.  Last Pain:  Vitals:   03/01/21 0638  TempSrc: Tympanic  PainSc:          Complications: No complications documented.

## 2021-03-01 NOTE — Interval H&P Note (Signed)
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  The various methods of treatment have been discussed with the patient and family. After consideration of risks, benefits and other options for treatment, the patient has consented to  Procedure(s): EXCISION OF BREAST BIOPSY w/ radiofrequency tag (Right) as a surgical intervention.  The patient's history has been reviewed, patient examined, no change in status, stable for surgery.  I have reviewed the patient's chart and labs.  Right chest marked in the pre procedure room. Questions were answered to the patient's satisfaction.     Herbert Pun

## 2021-03-01 NOTE — Op Note (Signed)
Preoperative diagnosis: Right breast intraductal papilloma.  Postoperative diagnosis: Right breast intraductal papilloma.   Procedure: Right radiofrequency tag-localized excisional biopsy.                    Anesthesia: GETA  Surgeon: Dr. Windell Moment  Wound Classification: Clean  Indications: Patient is a 70 y.o. female with a nonpalpable right breast mass noted on mammography with core biopsy demonstrating intraductal papilloma requires radiofrequency tag-localized excisional biopsy to rule out malignancy Findings: 1. Specimen mammography shows marker and tag on specimen 2. No other palpable mass or lymph node identified.   Description of procedure: Preoperative radiofrequency tag localization was performed by radiology.  Localization studies were reviewed. The patient was taken to the operating room and placed supine on the operating table, and after general anesthesia the right chest and axilla were prepped and draped in the usual sterile fashion. A time-out was completed verifying correct patient, procedure, site, positioning, and implant(s) and/or special equipment prior to beginning this procedure.  By comparing the localization studies and interrogation with Localizer device, the probable trajectory and location of the mass was visualized. A circumareolar skin incision was planned in such a way as to minimize the amount of dissection to reach the mass.  The skin incision was made. Flaps were raised and the location of the tag was confirmed with Localizer device confirmed. A 2-0 silk figure-of-eight stay suture was placed and used for retraction. Dissection was then taken down circumferentially, taking care to include the entire localizing tag and a wide margin of grossly normal tissue. The specimen and entire localizing tag were removed. The specimen was oriented and sent to radiology with the localization studies. Confirmation was received that the entire target lesion had been resected.  The wound was irrigated. Hemostasis was checked. The wound was closed with interrupted sutures of 3-0 Vicryl and a subcuticular suture of Monocryl 4-0. No attempt was made to close the dead space.   The patient tolerated the procedure well and was taken to the postanesthesia care unit in stable condition.   Specimen: Right Breast excisional biopsy                     Complications: None  Estimated Blood Loss: 5 mL

## 2021-03-01 NOTE — Anesthesia Preprocedure Evaluation (Signed)
Anesthesia Evaluation  Patient identified by MRN, date of birth, ID band Patient awake    Reviewed: Allergy & Precautions, NPO status , Patient's Chart, lab work & pertinent test results  History of Anesthesia Complications Negative for: history of anesthetic complications  Airway Mallampati: II  TM Distance: >3 FB Neck ROM: Full    Dental no notable dental hx.    Pulmonary neg sleep apnea, neg COPD, former smoker,    breath sounds clear to auscultation- rhonchi (-) wheezing      Cardiovascular hypertension, Pt. on medications + CAD and + Peripheral Vascular Disease  (-) Past MI, (-) Cardiac Stents and (-) CABG + Valvular Problems/Murmurs (s/p AVR)  Rhythm:Regular Rate:Normal - Systolic murmurs and - Diastolic murmurs    Neuro/Psych neg Seizures PSYCHIATRIC DISORDERS Anxiety Depression negative neurological ROS     GI/Hepatic Neg liver ROS, GERD  ,  Endo/Other  negative endocrine ROSneg diabetes  Renal/GU CRFRenal disease     Musculoskeletal negative musculoskeletal ROS (+)   Abdominal (+) + obese,   Peds  Hematology  (+) anemia ,   Anesthesia Other Findings Past Medical History: 11/14/2017: Anemia in chronic kidney disease (CKD) No date: Aneurysm of infrarenal abdominal aorta (HCC)     Comment:  a.) measured 2.3 x 2.8 cm on 11/15/2019 No date: 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)     Comment:  a.) measured 1.9 x 1.9 on 11/15/2019 No date: Anxiety No date: Aortic atherosclerosis ([place]) No date: Aortic valve regurgitation     Comment:  severe; s/p AVR 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 No date: CKD (chronic kidney disease) stage 4, GFR 15-29 ml/min (HCC) No date: Coronary artery disease No date: Current use of long term anticoagulation     Comment:  warfarin; managed at [facility] No date: Dizziness No date: Edema No date: GERD (gastroesophageal reflux disease) No date: Gout No date: Hypercholesterolemia No  date: Hypertension 01/29/2021: Intraductal papilloma of breast, right No date: Permanent atrial fibrillation (HCC) No date: Pseudoaneurysm of aorta (HCC)     Comment:  a.) proximal; enhancing portion measured 4.6 x 3.8 x 3.2              cm on 11/15/2019 No date: PVD (peripheral vascular disease) ([place])     Comment:  s/p axillofemoral bypass No date: Shortness of breath dyspnea 2004: Thoracoabdominal aortic dissection (HCC)   Reproductive/Obstetrics                             Anesthesia Physical Anesthesia Plan  ASA: III  Anesthesia Plan: General   Post-op Pain Management:    Induction: Intravenous  PONV Risk Score and Plan: 2 and Ondansetron and Dexamethasone  Airway Management Planned: LMA  Additional Equipment:   Intra-op Plan:   Post-operative Plan:   Informed Consent: I have reviewed the patients History and Physical, chart, labs and discussed the procedure including the risks, benefits and alternatives for the proposed anesthesia with the patient or authorized representative who has indicated his/her understanding and acceptance.     Dental advisory given  Plan Discussed with: CRNA and Anesthesiologist  Anesthesia Plan Comments:         Anesthesia Quick Evaluation

## 2021-03-01 NOTE — Anesthesia Procedure Notes (Signed)
Procedure Name: LMA Insertion Date/Time: 03/01/2021 7:35 AM Performed by: Tollie Eth, CRNA Pre-anesthesia Checklist: Patient identified, Patient being monitored, Timeout performed, Emergency Drugs available and Suction available Patient Re-evaluated:Patient Re-evaluated prior to induction Oxygen Delivery Method: Circle system utilized Preoxygenation: Pre-oxygenation with 100% oxygen Induction Type: IV induction Ventilation: Mask ventilation without difficulty LMA: LMA inserted LMA Size: 3.5 Tube type: Oral Number of attempts: 1 Placement Confirmation: positive ETCO2 and breath sounds checked- equal and bilateral Tube secured with: Tape Dental Injury: Teeth and Oropharynx as per pre-operative assessment

## 2021-03-01 NOTE — Anesthesia Postprocedure Evaluation (Signed)
Anesthesia Post Note  Patient: Janyah Singleterry Wilmes  Procedure(s) Performed: EXCISION OF BREAST BIOPSY w/ radiofrequency tag (Right Breast)  Patient location during evaluation: PACU Anesthesia Type: General Level of consciousness: awake and alert and oriented Pain management: pain level controlled Vital Signs Assessment: post-procedure vital signs reviewed and stable Respiratory status: spontaneous breathing, nonlabored ventilation and respiratory function stable Cardiovascular status: blood pressure returned to baseline and stable Postop Assessment: no signs of nausea or vomiting Anesthetic complications: no   No complications documented.   Last Vitals:  Vitals:   03/01/21 0930 03/01/21 0940  BP: (!) 117/56 (!) 116/48  Pulse: (!) 53 (!) 57  Resp: 10 12  Temp: (!) 36.3 C   SpO2: 97% 100%    Last Pain:  Vitals:   03/01/21 0940  TempSrc:   PainSc: 2                  Denny Mccree

## 2021-03-02 ENCOUNTER — Encounter: Payer: Self-pay | Admitting: General Surgery

## 2021-03-03 LAB — SURGICAL PATHOLOGY

## 2021-03-04 ENCOUNTER — Inpatient Hospital Stay: Payer: Medicare Other | Attending: Oncology

## 2021-03-04 ENCOUNTER — Inpatient Hospital Stay: Payer: Medicare Other

## 2021-03-04 VITALS — BP 140/73 | HR 54

## 2021-03-04 DIAGNOSIS — D631 Anemia in chronic kidney disease: Secondary | ICD-10-CM

## 2021-03-04 DIAGNOSIS — N184 Chronic kidney disease, stage 4 (severe): Secondary | ICD-10-CM | POA: Diagnosis present

## 2021-03-04 LAB — HEMOGLOBIN: Hemoglobin: 8.9 g/dL — ABNORMAL LOW (ref 12.0–15.0)

## 2021-03-04 LAB — HEMATOCRIT: HCT: 29.5 % — ABNORMAL LOW (ref 36.0–46.0)

## 2021-03-04 MED ORDER — EPOETIN ALFA-EPBX 10000 UNIT/ML IJ SOLN
20000.0000 [IU] | Freq: Once | INTRAMUSCULAR | Status: AC
Start: 2021-03-04 — End: 2021-03-04
  Administered 2021-03-04: 20000 [IU] via SUBCUTANEOUS
  Filled 2021-03-04: qty 2

## 2021-03-04 MED ORDER — EPOETIN ALFA-EPBX 40000 UNIT/ML IJ SOLN: 60000.0000 [IU] | Freq: Once | INTRAMUSCULAR | Status: DC

## 2021-03-04 MED ORDER — EPOETIN ALFA-EPBX 10000 UNIT/ML IJ SOLN
20000.0000 [IU] | Freq: Once | INTRAMUSCULAR | Status: DC
Start: 2021-03-04 — End: 2021-03-04

## 2021-03-04 MED ORDER — EPOETIN ALFA-EPBX 40000 UNIT/ML IJ SOLN
40000.0000 [IU] | Freq: Once | INTRAMUSCULAR | Status: DC
Start: 2021-03-04 — End: 2021-03-04

## 2021-03-04 MED ORDER — EPOETIN ALFA-EPBX 40000 UNIT/ML IJ SOLN
40000.0000 [IU] | Freq: Once | INTRAMUSCULAR | Status: AC
  Administered 2021-03-04: 40000 [IU] via SUBCUTANEOUS
  Filled 2021-03-04: qty 1

## 2021-03-17 ENCOUNTER — Emergency Department: Payer: Medicare Other

## 2021-03-17 ENCOUNTER — Other Ambulatory Visit: Payer: Self-pay

## 2021-03-17 ENCOUNTER — Emergency Department
Admission: EM | Admit: 2021-03-17 | Discharge: 2021-03-17 | Disposition: A | Discharge status: Home / Self Care | Payer: Medicare Other | Attending: Emergency Medicine | Admitting: Emergency Medicine

## 2021-03-17 DIAGNOSIS — I129 Hypertensive chronic kidney disease with stage 1 through stage 4 chronic kidney disease, or unspecified chronic kidney disease: Secondary | ICD-10-CM | POA: Insufficient documentation

## 2021-03-17 DIAGNOSIS — Z85038 Personal history of other malignant neoplasm of large intestine: Secondary | ICD-10-CM | POA: Diagnosis not present

## 2021-03-17 DIAGNOSIS — Z96651 Presence of right artificial knee joint: Secondary | ICD-10-CM | POA: Diagnosis not present

## 2021-03-17 DIAGNOSIS — I2510 Atherosclerotic heart disease of native coronary artery without angina pectoris: Secondary | ICD-10-CM | POA: Insufficient documentation

## 2021-03-17 DIAGNOSIS — Z7901 Long term (current) use of anticoagulants: Secondary | ICD-10-CM | POA: Diagnosis not present

## 2021-03-17 DIAGNOSIS — Z96652 Presence of left artificial knee joint: Secondary | ICD-10-CM | POA: Diagnosis not present

## 2021-03-17 DIAGNOSIS — N184 Chronic kidney disease, stage 4 (severe): Secondary | ICD-10-CM | POA: Diagnosis not present

## 2021-03-17 DIAGNOSIS — R0789 Other chest pain: Secondary | ICD-10-CM | POA: Insufficient documentation

## 2021-03-17 DIAGNOSIS — Z79899 Other long term (current) drug therapy: Secondary | ICD-10-CM | POA: Diagnosis not present

## 2021-03-17 DIAGNOSIS — R001 Bradycardia, unspecified: Secondary | ICD-10-CM | POA: Diagnosis not present

## 2021-03-17 DIAGNOSIS — Z87891 Personal history of nicotine dependence: Secondary | ICD-10-CM | POA: Insufficient documentation

## 2021-03-17 DIAGNOSIS — R079 Chest pain, unspecified: Secondary | ICD-10-CM | POA: Diagnosis present

## 2021-03-17 DIAGNOSIS — Z7982 Long term (current) use of aspirin: Secondary | ICD-10-CM | POA: Diagnosis not present

## 2021-03-17 LAB — CBC
HCT: 30.4 % — ABNORMAL LOW (ref 36.0–46.0)
Hemoglobin: 9.2 g/dL — ABNORMAL LOW (ref 12.0–15.0)
MCH: 28.7 pg (ref 26.0–34.0)
MCHC: 30.3 g/dL (ref 30.0–36.0)
MCV: 94.7 fL (ref 80.0–100.0)
Platelets: 181 10*3/uL (ref 150–400)
RBC: 3.21 MIL/uL — ABNORMAL LOW (ref 3.87–5.11)
RDW: 15.4 % (ref 11.5–15.5)
WBC: 6 10*3/uL (ref 4.0–10.5)
nRBC: 0 % (ref 0.0–0.2)

## 2021-03-17 LAB — BASIC METABOLIC PANEL
Anion gap: 7 (ref 5–15)
BUN: 53 mg/dL — ABNORMAL HIGH (ref 8–23)
CO2: 23 mmol/L (ref 22–32)
Calcium: 9.4 mg/dL (ref 8.9–10.3)
Chloride: 108 mmol/L (ref 98–111)
Creatinine, Ser: 2.48 mg/dL — ABNORMAL HIGH (ref 0.44–1.00)
GFR, Estimated: 20 mL/min — ABNORMAL LOW (ref >60)
Glucose, Bld: 102 mg/dL — ABNORMAL HIGH (ref 70–99)
Potassium: 3.9 mmol/L (ref 3.5–5.1)
Sodium: 138 mmol/L (ref 135–145)

## 2021-03-17 LAB — PROTIME-INR
INR: 1.9 — ABNORMAL HIGH (ref 0.8–1.2)
Prothrombin Time: 21.2 seconds — ABNORMAL HIGH (ref 11.4–15.2)

## 2021-03-17 LAB — TROPONIN I (HIGH SENSITIVITY)
Troponin I (High Sensitivity): 18 ng/L — ABNORMAL HIGH (ref <18)
Troponin I (High Sensitivity): 14 ng/L (ref <18)

## 2021-03-17 MED ORDER — ONDANSETRON 4 MG PO TBDP
4.0000 mg | ORAL_TABLET | Freq: Once | ORAL | Status: AC
  Administered 2021-03-17: 4 mg via ORAL
  Filled 2021-03-17: qty 1

## 2021-03-17 MED ORDER — OXYCODONE-ACETAMINOPHEN 5-325 MG PO TABS: 1.0000 | ORAL_TABLET | Freq: Four times a day (QID) | ORAL | 0 refills | Status: AC | PRN

## 2021-03-17 MED ORDER — OXYCODONE-ACETAMINOPHEN 5-325 MG PO TABS
1.0000 | ORAL_TABLET | Freq: Once | ORAL | Status: AC
  Administered 2021-03-17: 1 via ORAL
  Filled 2021-03-17: qty 1

## 2021-03-17 NOTE — ED Provider Notes (Signed)
[facility] Emergency Department Provider Note  ____________________________________________   Event Date/Time   First MD Initiated Contact with Patient 03/17/21 1038     (approximate)  I have reviewed the triage vital signs and the nursing notes.   HISTORY  Chief Complaint Chest Pain    HPI Jeanette Romero is a 70 y.o. female with past medical history as below here with mild positional right-sided chest pain.  The patient states that for the last several days, she has had a sharp, somewhat positional, stabbing, right-sided chest pain.  She recently had a lumpectomy of the right breast.  She states the pain is somewhat worse with position and palpation.  She denies any associated shortness of breath.  No hemoptysis or cough.  No fevers.  No leg swelling.  Of note, she did stop her Coumadin for her procedure but has been back on it for several days.  Denies any palpitations.  No leg swelling as mentioned.  No other complaints.        Past Medical History:  Diagnosis Date  . Anemia in chronic kidney disease (CKD) 11/14/2017  . Aneurysm of infrarenal abdominal aorta (HCC)    a.) measured 2.3 x 2.8 cm on 11/15/2019  . 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 Anxiety   . Aortic atherosclerosis ([place])   . Aortic valve regurgitation    severe; s/p AVR  . Aortic valve replaced   . CKD (chronic kidney disease) stage 4, GFR 15-29 ml/min (HCC)   . Coronary artery disease   . Current use of long term anticoagulation    warfarin; managed at [facility]  . Dizziness   . Edema   . GERD (gastroesophageal reflux disease)   . Gout   . Hypercholesterolemia   . Hypertension   . 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 Permanent atrial fibrillation ([place])   . Pseudoaneurysm of aorta (HCC)    a.) proximal; enhancing portion measured 4.6 x 3.8 x 3.2 cm on 11/15/2019  . PVD (peripheral vascular disease) (HCC)    s/p  axillofemoral bypass  . Shortness of breath dyspnea   . 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 Benign neoplasm of ascending colon   . Benign neoplasm of cecum   . Internal hemorrhoids   . Elevated troponin   . Columnar-lined esophagus   . Melena   . Acute posthemorrhagic anemia   . Demand ischemia of myocardium ([place])   . Symptomatic anemia 02/17/2019  . Anemia of chronic renal failure 11/14/2017  . Normocytic anemia 10/31/2017  . Fatigue associated with anemia 10/31/2017  . Depression 10/31/2017  . Hyperthyroidism 10/31/2017  . Chronic anticoagulation 10/31/2017  . Bradycardia 05/05/2015    Past Surgical History:  Procedure Laterality Date  . ABDOMINAL HYSTERECTOMY    . AORTIC VALVE REPLACEMENT    . BREAST BIOPSY Right 01/29/2021   [place] Bx, 6:00 vision clip, path pending   . BREAST BIOPSY Right 01/29/2021   [place] Bx, 8:00 Ribbon clip, path pending   . CARDIAC CATHETERIZATION    . CARDIAC CATHETERIZATION N/A 05/07/2015   Procedure: Left Heart Cath;  Surgeon: [name], MD;  Location: [place] CV LAB;  Service: Cardiovascular;  Laterality: N/A;  . CARDIAC VALVE REPLACEMENT    . COLONOSCOPY Left 02/20/2019   Procedure: COLONOSCOPY;  Surgeon: Virgel Manifold, MD;  Location: [facility] ENDOSCOPY;  Service: Endoscopy;  Laterality: Left;  . COLONOSCOPY N/A 02/21/2019   Procedure: COLONOSCOPY;  Surgeon: Virgel Manifold, MD;  Location: [facility] ENDOSCOPY;  Service: Endoscopy;  Laterality: N/A;  . ELECTROPHYSIOLOGIC STUDY N/A 05/05/2015   Procedure: Cardioversion;  Surgeon: [name], MD;  Location: [facility] ORS;  Service: Cardiovascular;  Laterality: N/A;  . ENTEROSCOPY N/A 02/21/2019   Procedure: ENTEROSCOPY;  Surgeon: Virgel Manifold, MD;  Location: [facility] ENDOSCOPY;  Service: Endoscopy;  Laterality: N/A;  . ESOPHAGOGASTRODUODENOSCOPY Left 02/19/2019   Procedure: ESOPHAGOGASTRODUODENOSCOPY (EGD);  Surgeon:  Virgel Manifold, MD;  Location: [facility] ENDOSCOPY;  Service: Endoscopy;  Laterality: Left;  . EXCISION OF BREAST BIOPSY Right 03/01/2021   Procedure: EXCISION OF BREAST BIOPSY w/ radiofrequency tag;  Surgeon: Herbert Pun, MD;  Location: [facility] ORS;  Service: General;  Laterality: Right;  . REPLACEMENT TOTAL KNEE BILATERAL      Prior to Admission medications   Medication Sig Start Date End Date Taking? Authorizing Provider  oxyCODONE-acetaminophen (PERCOCET) 5-325 MG tablet Take 1 tablet by mouth every 6 (six) hours as needed for severe pain. 03/17/21 03/17/22 Yes Duffy Bruce, MD  ALPRAZolam Duanne Moron) 0.25 MG tablet Take 0.25 mg by mouth 2 (two) times daily.    [provider]  amLODipine (NORVASC) 10 MG tablet Take 10 mg by mouth daily.     [provider]  amLODipine (NORVASC) 5 MG tablet Take 5 mg by mouth daily. 05/26/20   [provider]  amoxicillin (AMOXIL) 500 MG tablet Take 2,000 mg by mouth See admin instructions. Take 2000 mg by mouth one hour prior to dental procedures 01/17/19   [provider]  aspirin EC 81 MG tablet Take 81 mg by mouth daily.    [provider]  benazepril (LOTENSIN) 40 MG tablet Take 40 mg by mouth daily.    [provider]  cloNIDine (CATAPRES) 0.3 MG tablet Take 0.3 mg 3 (three) times daily by mouth. 10/14/17   [provider]  cyclobenzaprine (FLEXERIL) 5 MG tablet Take 1 tablet (5 mg total) by mouth 3 (three) times daily as needed for muscle spasms. 02/25/20   Sable Feil, PA-C  folic acid (FOLVITE) 1 MG tablet TAKE 1 TABLET(1 MG) BY MOUTH DAILY 11/20/20   Earlie Server, MD  furosemide (LASIX) 20 MG tablet Take 20-40 mg by mouth daily. Take 20 mg by mouth on odd days and 40 mg on even days 12/14/18   [provider]  hydrALAZINE (APRESOLINE) 100 MG tablet Take 100 mg by mouth 2 (two) times daily. 02/27/20   [provider]  pantoprazole (PROTONIX) 20 MG tablet Take 1 tablet (20 mg  total) by mouth daily. 02/23/19   Gladstone Lighter, MD  potassium chloride (K-DUR) 10 MEQ tablet Take 10 mEq by mouth daily. 05/04/18 03/19/20  [provider]  potassium chloride (KLOR-CON) 10 MEQ tablet Take 10 mEq by mouth daily. 05/26/20   [provider]  rosuvastatin (CRESTOR) 5 MG tablet Take 5 mg by mouth at bedtime. 02/20/18   [provider]  sodium bicarbonate 650 MG tablet Take 650 mg by mouth 2 (two) times daily. 05/18/20   [provider]  SODIUM BICARBONATE, ANTACID, PO Take 10 mg by mouth.    [provider]  traMADol (ULTRAM) 50 MG tablet Take 1 tablet (50 mg total) by mouth every 12 (twelve) hours as needed. 02/25/20   Sable Feil, PA-C  warfarin (COUMADIN) 4 MG tablet Take 4 mg by mouth  daily. 01/13/19   [provider]  warfarin (COUMADIN) 6 MG tablet Take 6 mg by mouth. 05/21/19   [provider]    Allergies Atorvastatin  Family History  Problem Relation Age of Onset  . Hypertension Mother   . Colon cancer Mother   . Prostate cancer Father   . Kidney cancer Brother   . Prostate cancer Brother   . Prostate cancer Brother   . Breast cancer Neg Hx     Social History Social History   Tobacco Use  . Smoking status: Former Smoker    Packs/day: 0.50    Years: 20.00    Pack years: 10.00    Types: Cigarettes    Quit date: 2004    Years since quitting: 18.2  . Smokeless tobacco: Never Used  Vaping Use  . Vaping Use: Never used  Substance Use Topics  . Alcohol use: Yes    Alcohol/week: 0.0 standard drinks    Comment: occas  . Drug use: No    Review of Systems  Review of Systems  Constitutional: Negative for fatigue and fever.  HENT: Negative for congestion and sore throat.   Eyes: Negative for visual disturbance.  Respiratory: Positive for cough. Negative for shortness of breath.   Cardiovascular: Positive for chest pain.  Gastrointestinal: Negative for abdominal pain, diarrhea, nausea and  vomiting.  Genitourinary: Negative for flank pain.  Musculoskeletal: Negative for back pain and neck pain.  Skin: Negative for rash and wound.  Neurological: Negative for weakness.  All other systems reviewed and are negative.    ____________________________________________  PHYSICAL EXAM:      VITAL SIGNS: ED Triage Vitals  Enc Vitals Group     BP 03/17/21 1003 137/76     Pulse Rate 03/17/21 1003 83     Resp 03/17/21 1003 15     Temp 03/17/21 1003 98.4 F (36.9 C)     Temp Source 03/17/21 1003 Oral     SpO2 03/17/21 1003 100 %     Weight 03/17/21 1003 181 lb (82.1 kg)     Height 03/17/21 1003 5\' 2"  (1.575 m)     Head Circumference --      Peak Flow --      Pain Score 03/17/21 1003 6     Pain Loc --      Pain Edu? --      Excl. in [place]? --      Physical Exam Vitals and nursing note reviewed.  Constitutional:      General: She is not in acute distress.    Appearance: She is well-developed.  HENT:     Head: Normocephalic and atraumatic.  Eyes:     Conjunctiva/sclera: Conjunctivae normal.  Cardiovascular:     Rate and Rhythm: Normal rate and regular rhythm.     Heart sounds: Normal heart sounds. No murmur heard. No friction rub.  Pulmonary:     Effort: Pulmonary effort is normal. No respiratory distress.     Breath sounds: Normal breath sounds. No wheezing or rales.  Chest:     Comments: Tenderness to palpation of the right parasternal musculature.  This reproduces her pain. Abdominal:     General: There is no distension.     Palpations: Abdomen is soft.     Tenderness: There is no abdominal tenderness.  Musculoskeletal:     Cervical back: Neck supple.  Skin:    General: Skin is warm.     Capillary Refill: Capillary refill takes less than 2  seconds.  Neurological:     Mental Status: She is alert and oriented to person, place, and time.     Motor: No abnormal muscle tone.       ____________________________________________   LABS (all labs ordered are  listed, but only abnormal results are displayed)  Labs Reviewed  BASIC METABOLIC PANEL - Abnormal; Notable for the following components:      Result Value   Glucose, Bld 102 (*)    BUN 53 (*)    Creatinine, Ser 2.48 (*)    GFR, Estimated 20 (*)    All other components within normal limits  CBC - Abnormal; Notable for the following components:   RBC 3.21 (*)    Hemoglobin 9.2 (*)    HCT 30.4 (*)    All other components within normal limits  PROTIME-INR - Abnormal; Notable for the following components:   Prothrombin Time 21.2 (*)    INR 1.9 (*)    All other components within normal limits  TROPONIN I (HIGH SENSITIVITY) - Abnormal; Notable for the following components:   Troponin I (High Sensitivity) 18 (*)    All other components within normal limits  TROPONIN I (HIGH SENSITIVITY)    ____________________________________________  EKG: Atrial fibrillation, ventricular rate 45.  QRS 122, QTc 427.  Nonspecific ST changes.  When compared to prior, bradycardia persist. ________________________________________  RADIOLOGY All imaging, including plain films, CT scans, and ultrasounds, independently reviewed by me, and interpretations confirmed via formal radiology reads.  ED MD interpretation:   Chest x-ray: Clear  Official radiology report(s): DG Chest 2 View  Result Date: 03/17/2021 CLINICAL DATA:  Chest pain. EXAM: CHEST - 2 VIEW COMPARISON:  February 20, 2019. FINDINGS: Stable cardiomegaly. No pneumothorax or pleural effusion is noted. Sternotomy wires are noted. No acute pulmonary disease is noted. IMPRESSION: No active cardiopulmonary disease. Electronically Signed   By: Marijo Conception M.D.   On: 03/17/2021 10:42    ____________________________________________  PROCEDURES   Procedure(s) performed (including Critical Care):  Procedures  ____________________________________________  INITIAL IMPRESSION / MDM / [place] / ED COURSE  As part of my medical decision  making, I reviewed the following data within the [place] notes reviewed and incorporated, Old chart reviewed, Notes from prior ED visits, and [place] Controlled Substance Database       *APREL EGELHOFF was evaluated in Emergency Department on 03/17/2021 for the symptoms described in the history of present illness. She was evaluated in the context of the global COVID-19 pandemic, which necessitated consideration that the patient might be at risk for infection with the SARS-CoV-2 virus that causes COVID-19. Institutional protocols and algorithms that pertain to the evaluation of patients at risk for COVID-19 are in a state of rapid change based on information released by regulatory bodies including the CDC and federal and state organizations. These policies and algorithms were followed during the patient's care in the ED.  Some ED evaluations and interventions may be delayed as a result of limited staffing during the pandemic.*     Medical Decision Making: 70 year old female here with atypical chest pain.  Pain is reproducible on examination and localized where she recently had a lumpectomy, and I suspect this could be musculoskeletal in etiology.  EKG shows slow A. fib which is well documented in her previous records, and her heart rate, although in the 40s, seem similar to multiple documented heart rates in the clinic.  Moreover, she has no lightheadedness, dizziness, hypotension, or  signs of symptomatic bradycardia.  Troponin minimally elevated then negative on repeat.  This appears to be her baseline.  Do not suspect ACS.  She is already anticoagulated on Coumadin and is not hypoxic, tachypneic, do not suspect PE.  Unable to perform CT due to chronic kidney disease, but treatment would be Coumadin regardless essentially.  Otherwise, patient appears well and would like to go home.  Will treat for possible musculoskeletal chest wall pain, outpatient follow-up.  Return precautions  given.  ____________________________________________  FINAL CLINICAL IMPRESSION(S) / ED DIAGNOSES  Final diagnoses:  Atypical chest pain  Bradycardia     MEDICATIONS GIVEN DURING THIS VISIT:  Medications  oxyCODONE-acetaminophen (PERCOCET/ROXICET) 5-325 MG per tablet 1 tablet (1 tablet Oral Given 03/17/21 1323)  ondansetron (ZOFRAN-ODT) disintegrating tablet 4 mg (4 mg Oral Given 03/17/21 1323)     ED Discharge Orders         Ordered    oxyCODONE-acetaminophen (PERCOCET) 5-325 MG tablet  Every 6 hours PRN        03/17/21 1405           Note:  This document was prepared using Dragon voice recognition software and may include unintentional dictation errors.   Duffy Bruce, MD 03/17/21 [phone]

## 2021-03-17 NOTE — ED Notes (Signed)
Dr. Myrene Buddy notified of patients HR in low 40s at this time.

## 2021-03-17 NOTE — ED Triage Notes (Signed)
Pt to ER via POV with complaints of L sided non-radiating chest pain accompanied with shortness of breath that started yesterday. Pain is at worst with inspiration.

## 2021-03-17 NOTE — ED Notes (Signed)
Pt placed in bed, hooked up to cardiac monitor, bp cuff and pulse ox. States her chest pain "is uncomfortable." Alert and oriented. Call light in reach, bed locked and low. NAD. Husband at bedside.

## 2021-03-17 NOTE — ED Notes (Signed)
MD at bedside. 

## 2021-03-17 NOTE — ED Notes (Signed)
Patient ambulated to and from restroom with steady gait

## 2021-04-06 ENCOUNTER — Inpatient Hospital Stay: Payer: Medicare Other

## 2021-04-08 ENCOUNTER — Inpatient Hospital Stay: Payer: Medicare Other | Admitting: Oncology

## 2021-04-08 ENCOUNTER — Inpatient Hospital Stay: Payer: Medicare Other

## 2021-04-13 ENCOUNTER — Inpatient Hospital Stay: Payer: Medicare Other | Attending: Oncology

## 2021-04-13 DIAGNOSIS — D631 Anemia in chronic kidney disease: Secondary | ICD-10-CM | POA: Diagnosis present

## 2021-04-13 DIAGNOSIS — N184 Chronic kidney disease, stage 4 (severe): Secondary | ICD-10-CM | POA: Diagnosis present

## 2021-04-13 LAB — IRON AND TIBC
Iron: 45 ug/dL (ref 28–170)
Saturation Ratios: 15 % (ref 10.4–31.8)
TIBC: 307 ug/dL (ref 250–450)
UIBC: 262 ug/dL

## 2021-04-13 LAB — CBC WITH DIFFERENTIAL/PLATELET
Abs Immature Granulocytes: 0.02 10*3/uL (ref 0.00–0.07)
Basophils Absolute: 0.1 10*3/uL (ref 0.0–0.1)
Basophils Relative: 1 %
Eosinophils Absolute: 0.2 10*3/uL (ref 0.0–0.5)
Eosinophils Relative: 4 %
HCT: 26.4 % — ABNORMAL LOW (ref 36.0–46.0)
Hemoglobin: 7.9 g/dL — ABNORMAL LOW (ref 12.0–15.0)
Immature Granulocytes: 0 %
Lymphocytes Relative: 15 %
Lymphs Abs: 0.7 10*3/uL (ref 0.7–4.0)
MCH: 27.8 pg (ref 26.0–34.0)
MCHC: 29.9 g/dL — ABNORMAL LOW (ref 30.0–36.0)
MCV: 93 fL (ref 80.0–100.0)
Monocytes Absolute: 0.4 10*3/uL (ref 0.1–1.0)
Monocytes Relative: 8 %
Neutro Abs: 3.3 10*3/uL (ref 1.7–7.7)
Neutrophils Relative %: 72 %
Platelets: 186 10*3/uL (ref 150–400)
RBC: 2.84 MIL/uL — ABNORMAL LOW (ref 3.87–5.11)
RDW: 15 % (ref 11.5–15.5)
WBC: 4.7 10*3/uL (ref 4.0–10.5)
nRBC: 0 % (ref 0.0–0.2)

## 2021-04-13 LAB — FERRITIN: Ferritin: 156 ng/mL (ref 11–307)

## 2021-04-16 ENCOUNTER — Inpatient Hospital Stay: Payer: Medicare Other

## 2021-04-16 ENCOUNTER — Encounter: Payer: Self-pay | Admitting: Oncology

## 2021-04-16 ENCOUNTER — Inpatient Hospital Stay (HOSPITAL_BASED_OUTPATIENT_CLINIC_OR_DEPARTMENT_OTHER): Payer: Medicare Other | Admitting: Oncology

## 2021-04-16 VITALS — BP 124/76 | HR 50 | Temp 96.7°F | Resp 18 | Wt 178.6 lb

## 2021-04-16 DIAGNOSIS — D509 Iron deficiency anemia, unspecified: Secondary | ICD-10-CM

## 2021-04-16 DIAGNOSIS — N6099 Unspecified benign mammary dysplasia of unspecified breast: Secondary | ICD-10-CM | POA: Diagnosis not present

## 2021-04-16 DIAGNOSIS — N184 Chronic kidney disease, stage 4 (severe): Secondary | ICD-10-CM

## 2021-04-16 DIAGNOSIS — Z79899 Other long term (current) drug therapy: Secondary | ICD-10-CM

## 2021-04-16 DIAGNOSIS — D649 Anemia, unspecified: Secondary | ICD-10-CM | POA: Diagnosis not present

## 2021-04-16 DIAGNOSIS — D631 Anemia in chronic kidney disease: Secondary | ICD-10-CM

## 2021-04-16 MED ORDER — EPOETIN ALFA-EPBX 40000 UNIT/ML IJ SOLN: 60000.0000 [IU] | Freq: Once | INTRAMUSCULAR | Status: DC

## 2021-04-16 MED ORDER — EPOETIN ALFA-EPBX 40000 UNIT/ML IJ SOLN
20000.0000 [IU] | Freq: Once | INTRAMUSCULAR | Status: AC
  Administered 2021-04-16: 20000 [IU] via SUBCUTANEOUS
  Filled 2021-04-16: qty 1

## 2021-04-16 MED ORDER — EPOETIN ALFA-EPBX 40000 UNIT/ML IJ SOLN
40000.0000 [IU] | Freq: Once | INTRAMUSCULAR | Status: AC
  Administered 2021-04-16: 40000 [IU] via SUBCUTANEOUS
  Filled 2021-04-16: qty 1

## 2021-04-16 NOTE — Progress Notes (Signed)
Pt here for follow up. No new concerns voiced.   

## 2021-04-16 NOTE — Progress Notes (Signed)
Hematology/Oncology Follow up note [facility] [phone] [phone]   Patient Care Team: Maryland Pink, MD as PCP - General (Family Medicine) Earlie Server, MD as Consulting Physician (Hematology and Oncology) Earlie Server, MD as Consulting Physician (Hematology and Oncology) Earlie Server, MD as Consulting Physician (Hematology and Oncology)  REFERRING PROVIDER: Maryland Pink, MD  REASON FOR VISIT Follow up for treatment of anemia of chronic kidney disease.  HISTORY OF PRESENTING ILLNESS:  Jeanette Romero is a  70 y.o.  female with PMH listed below who was referred to me for evaluation of labile INR. She has extensive cardiac comorbidities including history of aortic mechanic valve replacement, aneurysm of aorta, PAF, Coronary artery disease. She was on Amiodarone and also had drug induced hyperthyroidism.  She was sent to me for evaluation of labile INR.  Extensive review of her medical records from primary care physician's office showed  10/27/2017 INR 5.9,  10/24/2017 INR 4.9 10/12/2017 INR 1.2 10/07/2017 INR 6  10/04/2017 INR 8.1   08/11/2017 INR 7.5  05/24/2017 INR 3.4 04/26/2017 INR 3.5 03/15/2017 INR 3.4 01/31/2017 INR 2.7  08/29/2017 Folate 2.8,  B12 825 and the patient was started on folic acid supplementation.  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 Amiodarone was stopped. And patient was referred to endocrinologist for evaluation. She was found out to have Graves' disease and was started on prednisone. Last TSH that was done in the [place] system showed a TSH of 4.8.  # 3/9-3/13/2020  hospitalized due to weakness and noted to have acute on chronic anemia, black tarry stool. Status post EGD showing minimal gastritis and possible Barrett esophagus.   Biopsy was not done due to elevated INR and recent bleed.  Colonoscopy showing 3 to 4 mm sized sessile polyps.  Biopsies were not done.  Patient received PRBC transfusions during her admission  # #Abnormal CT finding: Patient had CT abdomen pelvis without contrast on 06/28/2018 for evaluation of right lower abdomen swelling.  There were incidental findings of 2 cm soft tissue nodule in the left posterior mediastinum.  Questionable enlarged lymph nodes.  I have previously discussed with patient about need of repeating CT scan of chest to evaluate stability.  I have discussed with her on multiple occasion. Patient declined.   INTERVAL HISTORY 70 y.o. female  presents for follow-up for management of anemia of CKD.  Patient reports feeling tired today, Last Retacrit was 03/04/2021  01/07/2021, bilateral breast screening mammogram 01/19/2021 right diagnostic mammogram: 1.8 cm intraductal mass in the retroareolar right breast at 6 o'clock.  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  Negative for malignancy Right breast 8:00 biopsy showed cystically dilated duct with associated histiocytes and a sclerosis.  Usual ductal hyperplasia.  Negative for atypia or malignancy.  03/01/2021, patient underwent right breast excisional biopsy.  Pathology showed  -Superior medial site :sclerosing intraductal papilloma, focal atypical lobular hyperplasia, extensive duct dilatation with periductal sclerosis.  Usual ductal hyperplasia. -Inferior lateral site: Focal atypical lobular hyperplasia, ductal dilatation with periductal sclerosis.  Usual ductal hyperplasia.    Post operation, patient developed seroma patient was seen by Dr. Peyton Najjar on 04/13/2021.  Patient is healing slowly  but adequately.  No signs of infection.  Patient is on  anticoagulation.  She denies no bleeding events. No new complaints.  Review of Systems  Constitutional: Positive for malaise/fatigue. Negative for chills, fever and weight loss.  HENT: Negative for sore throat.   Eyes: Negative for redness.  Respiratory: Negative for cough, shortness of breath and wheezing.   Cardiovascular: Negative for chest pain, palpitations and leg swelling.  Gastrointestinal: Negative for abdominal pain, blood in stool, nausea and vomiting.  Genitourinary: Negative for dysuria.  Musculoskeletal: Negative for myalgias.  Skin: Negative for rash.  Neurological: Negative for dizziness, tingling and tremors.  Endo/Heme/Allergies: Does not bruise/bleed easily.  Psychiatric/Behavioral: Negative for hallucinations.    MEDICAL HISTORY:  Past Medical History:  Diagnosis Date  . Anemia in chronic kidney disease (CKD) 11/14/2017  . Aneurysm of infrarenal abdominal aorta (HCC)    a.) measured 2.3 x 2.8 cm on 11/15/2019  . 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 Anxiety   . Aortic atherosclerosis ([place])   . Aortic valve regurgitation    severe; s/p AVR  . Aortic valve replaced   . CKD (chronic kidney disease) stage 4, GFR 15-29 ml/min (HCC)   . Coronary artery disease   . Current use of long term anticoagulation    warfarin; managed at [facility]  . Dizziness   . Edema   . GERD (gastroesophageal reflux disease)   . Gout   . Hypercholesterolemia   . Hypertension   . 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 Permanent atrial fibrillation ([place])   . Pseudoaneurysm of aorta (HCC)    a.) proximal; enhancing portion measured 4.6 x 3.8 x 3.2 cm on 11/15/2019  . PVD (peripheral vascular disease) (HCC)    s/p axillofemoral bypass  . Shortness of breath dyspnea   . 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 ABDOMINAL HYSTERECTOMY    . AORTIC  VALVE REPLACEMENT    . BREAST BIOPSY Right 01/29/2021   [place] Bx, 6:00 vision clip, path pending   . BREAST BIOPSY Right 01/29/2021   [place] Bx, 8:00 Ribbon clip, path pending   . CARDIAC CATHETERIZATION    . CARDIAC CATHETERIZATION N/A 05/07/2015   Procedure: Left Heart Cath;  Surgeon: [name], MD;  Location: [facility];  Service: Cardiovascular;  Laterality: N/A;  . CARDIAC VALVE REPLACEMENT    . COLONOSCOPY Left 02/20/2019   Procedure: COLONOSCOPY;  Surgeon: Virgel Manifold, MD;  Location: [facility] ENDOSCOPY;  Service: Endoscopy;  Laterality: Left;  . COLONOSCOPY N/A 02/21/2019   Procedure: COLONOSCOPY;  Surgeon: Virgel Manifold, MD;  Location: [facility] ENDOSCOPY;  Service: Endoscopy;  Laterality: N/A;  . ELECTROPHYSIOLOGIC STUDY N/A 05/05/2015   Procedure: Cardioversion;  Surgeon: [name], MD;  Location: [facility] ORS;  Service: Cardiovascular;  Laterality: N/A;  . ENTEROSCOPY N/A 02/21/2019   Procedure: ENTEROSCOPY;  Surgeon: Virgel Manifold, MD;  Location: [facility] ENDOSCOPY;  Service: Endoscopy;  Laterality: N/A;  . ESOPHAGOGASTRODUODENOSCOPY Left 02/19/2019   Procedure: ESOPHAGOGASTRODUODENOSCOPY (EGD);  Surgeon: Virgel Manifold, MD;  Location: [facility] ENDOSCOPY;  Service: Endoscopy;  Laterality: Left;  . EXCISION OF BREAST BIOPSY Right 03/01/2021   Procedure: EXCISION OF BREAST BIOPSY w/ radiofrequency tag;  Surgeon: Herbert Pun, MD;  Location: [facility] ORS;  Service: General;  Laterality: Right;  . REPLACEMENT TOTAL KNEE BILATERAL  SOCIAL HISTORY: Social History   Socioeconomic History  . Marital status: Married    Spouse name: Not on file  . Number of children: Not on file  . Years of education: Not on file  . Highest education level: Not on file  Occupational History  . Not on file  Tobacco Use  . Smoking status: Former Smoker    Packs/day: 0.50    Years: 20.00    Pack years: 10.00    Types: Cigarettes    Quit date: 2004    Years since  quitting: 18.3  . Smokeless tobacco: Never Used  Vaping Use  . Vaping Use: Never used  Substance and Sexual Activity  . Alcohol use: Yes    Alcohol/week: 0.0 standard drinks    Comment: occas  . Drug use: No  . Sexual activity: Not on file  Other Topics Concern  . Not on file  Social History Narrative  . Not on file   Social Determinants of Health   Financial Resource Strain: Not on file  Food Insecurity: Not on file  Transportation Needs: Not on file  Physical Activity: Not on file  Stress: Not on file  Social Connections: Not on file  Intimate Partner Violence: Not on file    FAMILY HISTORY: Family History  Problem Relation Age of Onset  . Hypertension Mother   . Colon cancer Mother   . Prostate cancer Father   . Kidney cancer Brother   . Prostate cancer Brother   . Prostate cancer Brother   . Breast cancer Neg Hx     ALLERGIES:  is allergic to atorvastatin.  MEDICATIONS:  Current Outpatient Medications  Medication Sig Dispense Refill  . ALPRAZolam (XANAX) 0.25 MG tablet Take 0.25 mg by mouth 2 (two) times daily.    Marland Kitchen amLODipine (NORVASC) 10 MG tablet Take 10 mg by mouth daily.     Marland Kitchen aspirin EC 81 MG tablet Take 81 mg by mouth daily.    . benazepril (LOTENSIN) 40 MG tablet Take 40 mg by mouth daily.    . cloNIDine (CATAPRES) 0.3 MG tablet Take 0.3 mg 3 (three) times daily by mouth.  2  . folic acid (FOLVITE) 1 MG tablet TAKE 1 TABLET(1 MG) BY MOUTH DAILY 90 tablet 3  . furosemide (LASIX) 20 MG tablet Take 20-40 mg by mouth daily. Take 20 mg by mouth on odd days and 40 mg on even days    . hydrALAZINE (APRESOLINE) 100 MG tablet Take 100 mg by mouth 2 (two) times daily.    . pantoprazole (PROTONIX) 20 MG tablet Take 1 tablet (20 mg total) by mouth daily. 30 tablet 2  . rosuvastatin (CRESTOR) 5 MG tablet Take 5 mg by mouth at bedtime.  2  . sodium bicarbonate 650 MG tablet Take 650 mg by mouth 2 (two) times daily.    . traMADol (ULTRAM) 50 MG tablet Take 1 tablet  (50 mg total) by mouth every 12 (twelve) hours as needed. 12 tablet 0  . warfarin (COUMADIN) 4 MG tablet Take 4 mg by mouth daily.    Marland Kitchen amoxicillin (AMOXIL) 500 MG tablet Take 2,000 mg by mouth See admin instructions. Take 2000 mg by mouth one hour prior to dental procedures (Patient not taking: Reported on 04/16/2021)    . cyclobenzaprine (FLEXERIL) 5 MG tablet Take 1 tablet (5 mg total) by mouth 3 (three) times daily as needed for muscle spasms. (Patient not taking: Reported on 04/16/2021) 15 tablet 0  . oxyCODONE-acetaminophen (PERCOCET)  5-325 MG tablet Take 1 tablet by mouth every 6 (six) hours as needed for severe pain. (Patient not taking: Reported on 04/16/2021) 8 tablet 0  . potassium chloride (K-DUR) 10 MEQ tablet Take 10 mEq by mouth daily.    . potassium chloride (KLOR-CON) 10 MEQ tablet Take 10 mEq by mouth daily. (Patient not taking: Reported on 04/16/2021)    . SODIUM BICARBONATE, ANTACID, PO Take 10 mg by mouth. (Patient not taking: Reported on 04/16/2021)    . warfarin (COUMADIN) 6 MG tablet Take 6 mg by mouth. (Patient not taking: Reported on 04/16/2021)     No current facility-administered medications for this visit.     PHYSICAL EXAMINATION: ECOG PERFORMANCE STATUS: 1 - Symptomatic but completely ambulatory Vitals:   04/16/21 1016  BP: 124/76  Pulse: (!) 50  Resp: 18  Temp: (!) 96.7 F (35.9 C)   Filed Weights   04/16/21 1016  Weight: 178 lb 9.6 oz (81 kg)    Physical Exam Constitutional:      General: She is not in acute distress.    Appearance: She is not diaphoretic.  HENT:     Head: Normocephalic and atraumatic.     Nose: Nose normal.     Mouth/Throat:     Pharynx: No oropharyngeal exudate.  Eyes:     General: No scleral icterus.       Left eye: No discharge.     Pupils: Pupils are equal, round, and reactive to light.  Neck:     Vascular: No JVD.  Cardiovascular:     Rate and Rhythm: Normal rate. Rhythm irregular.     Heart sounds: Murmur heard.     Pulmonary:     Effort: Pulmonary effort is normal. No respiratory distress.     Breath sounds: No wheezing or rales.  Chest:     Chest wall: No tenderness.  Abdominal:     General: Bowel sounds are normal. There is no distension.     Palpations: Abdomen is soft. There is no mass.     Tenderness: There is no abdominal tenderness. There is no rebound.  Musculoskeletal:        General: No tenderness. Normal range of motion.     Cervical back: Normal range of motion and neck supple.  Lymphadenopathy:     Cervical: No cervical adenopathy.  Skin:    General: Skin is warm and dry.     Findings: No erythema or rash.  Neurological:     Mental Status: She is alert and oriented to person, place, and time.     Cranial Nerves: No cranial nerve deficit.     Motor: No abnormal muscle tone.     Coordination: Coordination normal.  Psychiatric:        Mood and Affect: Mood and affect normal.    Status post right breast excisional biopsy  LABORATORY DATA:  I have reviewed the data as listed Lab Results  Component Value Date   WBC 4.7 04/13/2021   HGB 7.9 (L) 04/13/2021   HCT 26.4 (L) 04/13/2021   MCV 93.0 04/13/2021   PLT 186 04/13/2021   Recent Labs    06/18/20 1106 01/04/21 1023 03/01/21 0645 03/17/21 1008  NA 142 138 142 138  K 5.2* 4.3 4.4 3.9  CL 108 106 108 108  CO2 24 22  --  23  GLUCOSE 104* 98 104* 102*  BUN 73* 72* 84* 53*  CREATININE 2.82* 2.54* 3.00* 2.48*  CALCIUM 9.2 9.3  --  9.4  GFRNONAA 16* 20*  --  20*  GFRAA 19*  --   --   --   PROT 7.5 7.1  --   --   ALBUMIN 4.1 4.0  --   --   AST 15 16  --   --   ALT 12 11  --   --   ALKPHOS 83 75  --   --   BILITOT 0.5 0.7  --   --    Iron/TIBC/Ferritin/ %Sat    Component Value Date/Time   IRON 45 04/13/2021 1126   TIBC 307 04/13/2021 1126   FERRITIN 156 04/13/2021 1126   IRONPCTSAT 15 04/13/2021 1126    08/29/2017 Folate 2.8,  B12 825              01/02/2018 TSH 3.05                                                                                                                                                         RADIOGRAPHIC STUDIES: I have personally reviewed the radiological images as listed and agreed with the findings in the report. 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  Mild aneurysmal dilation of the proximal abdominal aorta 3.7 cm.  Femorofemoral crossover graft and right axillo femoral bypass graft noted.  Rounded fluid collection surrounds the distal aspect of the axillofemoral bypass graft.  Presumably postoperative seroma.  Moderate stool burden in the colon.  2 cm soft tissue nodule in the left posterior mediastinum presumably mildly enlarged lymph node.  This could be followed with a repeat CT chest in 6 months to assess stability.  Cardiomegaly.  CAD.  ASSESSMENT & PLAN:  1. Anemia of chronic renal failure, stage 4 (severe) ([place])   2. Encounter for ESA (erythropoietin stimulating agent) anemia management   3. Iron deficiency anemia, unspecified iron deficiency anemia type   4. Atypical lobular hyperplasia ([facility]) of breast    #Anemia of chronic kidney disease Labs reviewed and discussed with patient. Hemoglobin is 7.9. Likely due to recent surgery and overdue for Retacrit. Proceed with Retacrit 60,000 units today.   #History of GI bleeding due to chronic anticoagulation. Iron labs are reviewed, I recommend patient to receive IV venofer x 1 to further increase her iron store.  patient declined.  #History  of folic acid deficiency, loud heart murmur.  continue folic acid supplementation.   #Atypical lobular hyperplasia: AH confers a moderate increase in the risk of subsequent breast cancer (relative risk 3 to 5).  Recommend active surveillance. Options of chemoprevention was discussed with the patient and potential side effects were discussed.  Patient is not interested.  With patient's multiple comorbidities, her life expectancy is likely less than 10 years and I think is reasonable just continue active surveillance.  She agrees with the plan.  All questions were answered. The patient knows to call the clinic with any problems questions or concerns. Orders Placed This Encounter  Procedures  . Hemoglobin and hematocrit, blood    Standing Status:   Future    Standing Expiration Date:   04/16/2022  . CBC with Differential/Platelet    Standing Status:   Future    Standing Expiration Date:   04/16/2022  . Hemoglobin and hematocrit, blood    Standing Status:   Future    Standing Expiration Date:   04/16/2022    Return of visit:   H&H in 4 weeks, 8 weeks, +/- retacrit, lab md in 12 weeks +/- retacrit.    Earlie Server, MD, PhD 04/16/21

## 2021-04-22 ENCOUNTER — Inpatient Hospital Stay: Payer: Medicare Other

## 2021-04-22 VITALS — BP 112/66

## 2021-04-22 DIAGNOSIS — D631 Anemia in chronic kidney disease: Secondary | ICD-10-CM

## 2021-04-22 DIAGNOSIS — N184 Chronic kidney disease, stage 4 (severe): Secondary | ICD-10-CM | POA: Diagnosis not present

## 2021-04-22 DIAGNOSIS — D509 Iron deficiency anemia, unspecified: Secondary | ICD-10-CM

## 2021-04-22 LAB — HEMOGLOBIN AND HEMATOCRIT, BLOOD
HCT: 27.3 % — ABNORMAL LOW (ref 36.0–46.0)
Hemoglobin: 8.2 g/dL — ABNORMAL LOW (ref 12.0–15.0)

## 2021-04-22 MED ORDER — EPOETIN ALFA-EPBX 40000 UNIT/ML IJ SOLN
40000.0000 [IU] | Freq: Once | INTRAMUSCULAR | Status: AC
  Administered 2021-04-22: 40000 [IU] via SUBCUTANEOUS

## 2021-04-22 MED ORDER — EPOETIN ALFA-EPBX 10000 UNIT/ML IJ SOLN
20000.0000 [IU] | Freq: Once | INTRAMUSCULAR | Status: AC
  Administered 2021-04-22: 20000 [IU] via SUBCUTANEOUS

## 2021-04-22 MED ORDER — EPOETIN ALFA-EPBX 40000 UNIT/ML IJ SOLN
60000.0000 [IU] | Freq: Once | INTRAMUSCULAR | Status: DC
  Filled 2021-04-22: qty 2

## 2021-04-23 ENCOUNTER — Inpatient Hospital Stay: Payer: Medicare Other

## 2021-04-23 ENCOUNTER — Other Ambulatory Visit: Payer: Self-pay

## 2021-04-23 DIAGNOSIS — D631 Anemia in chronic kidney disease: Secondary | ICD-10-CM

## 2021-04-29 ENCOUNTER — Inpatient Hospital Stay: Payer: Medicare Other

## 2021-04-30 ENCOUNTER — Inpatient Hospital Stay: Payer: Medicare Other

## 2021-05-06 ENCOUNTER — Other Ambulatory Visit: Payer: Self-pay | Admitting: General Surgery

## 2021-05-06 DIAGNOSIS — N6489 Other specified disorders of breast: Secondary | ICD-10-CM

## 2021-05-10 ENCOUNTER — Other Ambulatory Visit: Payer: Self-pay | Admitting: General Surgery

## 2021-05-10 DIAGNOSIS — N6310 Unspecified lump in the right breast, unspecified quadrant: Secondary | ICD-10-CM

## 2021-05-11 ENCOUNTER — Inpatient Hospital Stay: Admission: RE | Admit: 2021-05-11 | Payer: Medicare Other | Source: Ambulatory Visit

## 2021-05-11 ENCOUNTER — Other Ambulatory Visit: Payer: Self-pay

## 2021-05-11 ENCOUNTER — Ambulatory Visit
Admission: RE | Admit: 2021-05-11 | Discharge: 2021-05-11 | Disposition: A | Discharge status: Home / Self Care | Payer: Medicare Other | Source: Ambulatory Visit | Attending: General Surgery | Admitting: General Surgery

## 2021-05-11 DIAGNOSIS — N6489 Other specified disorders of breast: Secondary | ICD-10-CM | POA: Insufficient documentation

## 2021-05-14 ENCOUNTER — Other Ambulatory Visit: Payer: Medicare Other

## 2021-05-14 ENCOUNTER — Ambulatory Visit: Payer: Medicare Other

## 2021-05-20 ENCOUNTER — Inpatient Hospital Stay: Payer: Medicare Other | Attending: Oncology

## 2021-05-20 ENCOUNTER — Inpatient Hospital Stay: Payer: Medicare Other

## 2021-05-20 VITALS — BP 119/62 | HR 50

## 2021-05-20 DIAGNOSIS — N184 Chronic kidney disease, stage 4 (severe): Secondary | ICD-10-CM | POA: Insufficient documentation

## 2021-05-20 DIAGNOSIS — D509 Iron deficiency anemia, unspecified: Secondary | ICD-10-CM

## 2021-05-20 DIAGNOSIS — D631 Anemia in chronic kidney disease: Secondary | ICD-10-CM | POA: Insufficient documentation

## 2021-05-20 LAB — HEMOGLOBIN AND HEMATOCRIT, BLOOD
HCT: 27.6 % — ABNORMAL LOW (ref 36.0–46.0)
Hemoglobin: 8.3 g/dL — ABNORMAL LOW (ref 12.0–15.0)

## 2021-05-20 MED ORDER — EPOETIN ALFA-EPBX 40000 UNIT/ML IJ SOLN
40000.0000 [IU] | Freq: Once | INTRAMUSCULAR | Status: AC
  Administered 2021-05-20: 40000 [IU] via SUBCUTANEOUS
  Filled 2021-05-20: qty 1

## 2021-05-20 MED ORDER — EPOETIN ALFA-EPBX 10000 UNIT/ML IJ SOLN
20000.0000 [IU] | Freq: Once | INTRAMUSCULAR | Status: AC
  Administered 2021-05-20: 20000 [IU] via SUBCUTANEOUS
  Filled 2021-05-20: qty 2

## 2021-05-20 MED ORDER — EPOETIN ALFA-EPBX 40000 UNIT/ML IJ SOLN: 60000.0000 [IU] | Freq: Once | INTRAMUSCULAR | Status: DC

## 2021-06-11 ENCOUNTER — Other Ambulatory Visit: Payer: Medicare Other

## 2021-06-11 ENCOUNTER — Encounter: Payer: Self-pay | Admitting: Oncology

## 2021-06-11 ENCOUNTER — Ambulatory Visit: Payer: Medicare Other

## 2021-06-17 ENCOUNTER — Inpatient Hospital Stay: Payer: Medicare Other

## 2021-06-17 ENCOUNTER — Inpatient Hospital Stay: Payer: Medicare Other | Attending: Oncology

## 2021-06-17 VITALS — BP 127/78 | HR 62

## 2021-06-17 DIAGNOSIS — D631 Anemia in chronic kidney disease: Secondary | ICD-10-CM | POA: Insufficient documentation

## 2021-06-17 DIAGNOSIS — N6310 Unspecified lump in the right breast, unspecified quadrant: Secondary | ICD-10-CM

## 2021-06-17 DIAGNOSIS — N184 Chronic kidney disease, stage 4 (severe): Secondary | ICD-10-CM | POA: Insufficient documentation

## 2021-06-17 LAB — HEMOGLOBIN AND HEMATOCRIT, BLOOD
HCT: 29.7 % — ABNORMAL LOW (ref 36.0–46.0)
Hemoglobin: 8.8 g/dL — ABNORMAL LOW (ref 12.0–15.0)

## 2021-06-17 MED ORDER — EPOETIN ALFA-EPBX 40000 UNIT/ML IJ SOLN: 60000.0000 [IU] | Freq: Once | INTRAMUSCULAR | Status: DC

## 2021-06-17 MED ORDER — EPOETIN ALFA-EPBX 10000 UNIT/ML IJ SOLN
20000.0000 [IU] | Freq: Once | INTRAMUSCULAR | Status: AC
  Administered 2021-06-17: 20000 [IU] via SUBCUTANEOUS
  Filled 2021-06-17: qty 2

## 2021-06-17 MED ORDER — EPOETIN ALFA-EPBX 40000 UNIT/ML IJ SOLN
40000.0000 [IU] | Freq: Once | INTRAMUSCULAR | Status: AC
  Administered 2021-06-17: 40000 [IU] via SUBCUTANEOUS
  Filled 2021-06-17: qty 1

## 2021-07-08 ENCOUNTER — Encounter: Payer: Self-pay | Admitting: Oncology

## 2021-07-12 ENCOUNTER — Encounter: Payer: Self-pay | Admitting: Oncology

## 2021-07-13 ENCOUNTER — Encounter: Payer: Self-pay | Admitting: Oncology

## 2021-07-14 ENCOUNTER — Other Ambulatory Visit: Payer: Self-pay | Admitting: *Deleted

## 2021-07-15 ENCOUNTER — Encounter: Payer: Self-pay | Admitting: Oncology

## 2021-07-15 MED ORDER — FOLIC ACID 1 MG PO TABS: ORAL_TABLET | ORAL | 3 refills | Status: DC

## 2021-07-16 ENCOUNTER — Inpatient Hospital Stay: Payer: Medicare Other

## 2021-07-16 ENCOUNTER — Inpatient Hospital Stay: Payer: Medicare Other | Attending: Oncology

## 2021-07-16 ENCOUNTER — Inpatient Hospital Stay (HOSPITAL_BASED_OUTPATIENT_CLINIC_OR_DEPARTMENT_OTHER): Payer: Medicare Other | Admitting: Oncology

## 2021-07-16 ENCOUNTER — Encounter: Payer: Self-pay | Admitting: Oncology

## 2021-07-16 VITALS — BP 142/70 | HR 55 | Temp 97.4°F | Resp 18 | Wt 174.2 lb

## 2021-07-16 DIAGNOSIS — D631 Anemia in chronic kidney disease: Secondary | ICD-10-CM

## 2021-07-16 DIAGNOSIS — N184 Chronic kidney disease, stage 4 (severe): Secondary | ICD-10-CM

## 2021-07-16 DIAGNOSIS — Z79899 Other long term (current) drug therapy: Secondary | ICD-10-CM

## 2021-07-16 DIAGNOSIS — D509 Iron deficiency anemia, unspecified: Secondary | ICD-10-CM

## 2021-07-16 DIAGNOSIS — N6099 Unspecified benign mammary dysplasia of unspecified breast: Secondary | ICD-10-CM | POA: Diagnosis not present

## 2021-07-16 DIAGNOSIS — D649 Anemia, unspecified: Secondary | ICD-10-CM

## 2021-07-16 LAB — CBC WITH DIFFERENTIAL/PLATELET
Abs Immature Granulocytes: 0.01 10*3/uL (ref 0.00–0.07)
Basophils Absolute: 0.1 10*3/uL (ref 0.0–0.1)
Basophils Relative: 1 %
Eosinophils Absolute: 0.2 10*3/uL (ref 0.0–0.5)
Eosinophils Relative: 5 %
HCT: 30.7 % — ABNORMAL LOW (ref 36.0–46.0)
Hemoglobin: 9.1 g/dL — ABNORMAL LOW (ref 12.0–15.0)
Immature Granulocytes: 0 %
Lymphocytes Relative: 18 %
Lymphs Abs: 0.8 10*3/uL (ref 0.7–4.0)
MCH: 27.7 pg (ref 26.0–34.0)
MCHC: 29.6 g/dL — ABNORMAL LOW (ref 30.0–36.0)
MCV: 93.3 fL (ref 80.0–100.0)
Monocytes Absolute: 0.5 10*3/uL (ref 0.1–1.0)
Monocytes Relative: 11 %
Neutro Abs: 2.8 10*3/uL (ref 1.7–7.7)
Neutrophils Relative %: 65 %
Platelets: 181 10*3/uL (ref 150–400)
RBC: 3.29 MIL/uL — ABNORMAL LOW (ref 3.87–5.11)
RDW: 15.2 % (ref 11.5–15.5)
WBC: 4.3 10*3/uL (ref 4.0–10.5)
nRBC: 0 % (ref 0.0–0.2)

## 2021-07-16 MED ORDER — EPOETIN ALFA-EPBX 40000 UNIT/ML IJ SOLN: 60000.0000 [IU] | Freq: Once | INTRAMUSCULAR | Status: DC

## 2021-07-16 MED ORDER — EPOETIN ALFA-EPBX 40000 UNIT/ML IJ SOLN
40000.0000 [IU] | Freq: Once | INTRAMUSCULAR | Status: AC
  Administered 2021-07-16: 40000 [IU] via SUBCUTANEOUS
  Filled 2021-07-16: qty 1

## 2021-07-16 MED ORDER — EPOETIN ALFA-EPBX 10000 UNIT/ML IJ SOLN
20000.0000 [IU] | Freq: Once | INTRAMUSCULAR | Status: AC
  Administered 2021-07-16: 20000 [IU] via SUBCUTANEOUS
  Filled 2021-07-16: qty 2

## 2021-07-16 NOTE — Progress Notes (Signed)
Pt here for follow up. No new concerns voiced.   

## 2021-07-16 NOTE — Progress Notes (Signed)
Hematology/Oncology Follow up note [facility] [phone] Fax:([phone]) [phone]   Patient Care Team: Maryland Pink, MD as PCP - General (Family Medicine) Earlie Server, MD as Consulting Physician (Hematology and Oncology)  REFERRING PROVIDER: Maryland Pink, MD  REASON FOR VISIT Follow up for treatment of anemia of chronic kidney disease.  HISTORY OF PRESENTING ILLNESS:  Jeanette Romero is a  70 y.o.  female with PMH listed below who was referred to me for evaluation of labile INR. She has extensive cardiac comorbidities including history of aortic mechanic valve replacement, aneurysm of aorta, PAF, Coronary artery disease. She was on Amiodarone and also had drug induced hyperthyroidism.  She was sent to me for evaluation of labile INR.  Extensive review of her medical records from primary care physician's office showed  10/27/2017 INR 5.9,  10/24/2017 INR 4.9 10/12/2017 INR 1.2 10/07/2017 INR 6  10/04/2017 INR 8.1   08/11/2017 INR 7.5  05/24/2017 INR 3.4 04/26/2017 INR 3.5 03/15/2017 INR 3.4 01/31/2017 INR 2.7  08/29/2017 Folate 2.8,  B12 825 and the patient was started on folic acid supplementation.  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 Amiodarone was stopped. And patient was referred to endocrinologist for evaluation. She was found out to have Graves' disease and was started on prednisone. Last TSH that was done in the [facility] system showed a TSH of 4.8.  # 3/9-3/13/2020  hospitalized due to weakness and noted to have acute on chronic anemia, black tarry stool. Status post EGD showing minimal gastritis and possible Barrett esophagus.  Biopsy was not done due to elevated INR and recent bleed.  Colonoscopy showing 3 to 4 mm sized sessile polyps.  Biopsies were  not done.  Patient received PRBC transfusions during her admission  # #Abnormal CT finding: Patient had CT abdomen pelvis without contrast on 06/28/2018 for evaluation of right lower abdomen swelling.  There were incidental findings of 2 cm soft tissue nodule in the left posterior mediastinum.  Questionable enlarged lymph nodes.  I have previously discussed with patient about need of repeating CT scan of chest to evaluate stability.  I have discussed with her on multiple occasion. Patient declined.     # 01/07/2021, bilateral breast screening mammogram 01/19/2021 right diagnostic mammogram: 1.8 cm intraductal mass in the retroareolar right breast at 6 o'clock.  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  Negative for malignancy Right breast 8:00 biopsy showed cystically dilated duct with associated histiocytes and a sclerosis.  Usual ductal hyperplasia.  Negative for atypia or malignancy.  03/01/2021, patient underwent right breast excisional biopsy.  Pathology showed  -Superior medial site :sclerosing intraductal papilloma, focal atypical lobular hyperplasia, extensive duct dilatation with periductal sclerosis.  Usual ductal hyperplasia. -Inferior lateral site: Focal atypical lobular hyperplasia, ductal dilatation with periductal sclerosis.  Usual ductal hyperplasia.    Post operation, patient developed seroma patient was seen by Dr. P[place] Najjar on 04/13/2021.  Patient is healing slowly but adequately.  No signs of infection.   INTERVAL HISTORY 70 y.o. female  presents for follow-up for management of anemia of CKD.  Patient reports feeling well today.  She is on anticoagulation, no bleeding events.  Review of  Systems  Constitutional:  Positive for malaise/fatigue. Negative for chills, fever and weight loss.  HENT:  Negative  for sore throat.   Eyes:  Negative for redness.  Respiratory:  Negative for cough, shortness of breath and wheezing.   Cardiovascular:  Negative for chest pain, palpitations and leg swelling.  Gastrointestinal:  Negative for abdominal pain, blood in stool, nausea and vomiting.  Genitourinary:  Negative for dysuria.  Musculoskeletal:  Negative for myalgias.  Skin:  Negative for rash.  Neurological:  Negative for dizziness, tingling and tremors.  Endo/Heme/Allergies:  Does not bruise/bleed easily.  Psychiatric/Behavioral:  Negative for hallucinations.    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)    a.) measured 1.9 x 1.9 on 11/15/2019   Anxiety    Aortic atherosclerosis (HCC)    Aortic valve regurgitation    severe; s/p AVR   Aortic valve replaced    CKD (chronic kidney disease) stage 4, GFR 15-29 ml/min (HCC)    Coronary artery disease    Current use of long term anticoagulation    warfarin; managed at [facility]   Dizziness    Edema    GERD (gastroesophageal reflux disease)    Gout    Hypercholesterolemia    Hypertension    Intraductal papilloma of breast, 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   Permanent atrial fibrillation (HCC)    Pseudoaneurysm of aorta (HCC)    a.) proximal; enhancing portion measured 4.6 x 3.8 x 3.2 cm on 11/15/2019   PVD (peripheral vascular disease) ([place])    s/p axillofemoral bypass   Shortness of breath dyspnea    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    SURGICAL HISTORY: Past Surgical History:  Procedure Laterality Date   ABDOMINAL HYSTERECTOMY     AORTIC VALVE REPLACEMENT     BREAST BIOPSY Right 01/29/2021   [place] Bx, 6:00 vision clip, path pending    BREAST BIOPSY Right 01/29/2021   [place] Bx, 8:00 Ribbon clip, path pending    CARDIAC CATHETERIZATION     CARDIAC  CATHETERIZATION N/A 05/07/2015   Procedure: Left Heart Cath;  Surgeon: [name], MD;  Location: [facility];  Service: Cardiovascular;  Laterality: N/A;   CARDIAC VALVE REPLACEMENT     COLONOSCOPY Left 02/20/2019   Procedure: COLONOSCOPY;  Surgeon: Virgel Manifold, MD;  Location: [facility];  Service: Endoscopy;  Laterality: Left;   COLONOSCOPY N/A 02/21/2019   Procedure: COLONOSCOPY;  Surgeon: Virgel Manifold, MD;  Location: [facility];  Service: Endoscopy;  Laterality: N/A;   ELECTROPHYSIOLOGIC STUDY N/A 05/05/2015   Procedure: Cardioversion;  Surgeon: [name], MD;  Location: [facility];  Service: Cardiovascular;  Laterality: N/A;   ENTEROSCOPY N/A 02/21/2019   Procedure: ENTEROSCOPY;  Surgeon: Virgel Manifold, MD;  Location: [facility];  Service: Endoscopy;  Laterality: N/A;   ESOPHAGOGASTRODUODENOSCOPY Left 02/19/2019   Procedure: ESOPHAGOGASTRODUODENOSCOPY (EGD);  Surgeon: Virgel Manifold, MD;  Location: [facility] ENDOSCOPY;  Service: Endoscopy;  Laterality: Left;   EXCISION OF BREAST BIOPSY Right 03/01/2021   Procedure: EXCISION OF BREAST BIOPSY w/ radiofrequency tag;  Surgeon: Herbert Pun, MD;  Location: [facility];  Service: General;  Laterality: Right;   REPLACEMENT TOTAL KNEE BILATERAL      SOCIAL HISTORY: Social History   Socioeconomic History   Marital status:  Married    Spouse name: Not on file   Number of children: Not on file   Years of education: Not on file   Highest education level: Not on file  Occupational History   Not on file  Tobacco Use   Smoking status: Former    Packs/day: 0.50    Years: 20.00    Pack years: 10.00    Types: Cigarettes    Quit date: 2004    Years since quitting: 18.6   Smokeless tobacco: Never  Vaping Use   Vaping Use: Never used  Substance and Sexual Activity   Alcohol use: Yes    Alcohol/week: 0.0 standard drinks    Comment: occas   Drug use: No   Sexual activity: Not on file   Other Topics Concern   Not on file  Social History Narrative   Not on file   Social Determinants of Health   Financial Resource Strain: Not on file  Food Insecurity: Not on file  Transportation Needs: Not on file  Physical Activity: Not on file  Stress: Not on file  Social Connections: Not on file  Intimate Partner Violence: Not on file    FAMILY HISTORY: Family History  Problem Relation Age of Onset   Hypertension Mother    Colon cancer Mother    Prostate cancer Father    Kidney cancer Brother    Prostate cancer Brother    Prostate cancer Brother    Breast cancer Neg Hx     ALLERGIES:  is allergic to atorvastatin.  MEDICATIONS:  Current Outpatient Medications  Medication Sig Dispense Refill   ALPRAZolam (XANAX) 0.25 MG tablet Take 0.25 mg by mouth 2 (two) times daily.     amLODipine (NORVASC) 10 MG tablet Take 10 mg by mouth daily.      aspirin EC 81 MG tablet Take 81 mg by mouth daily.     benazepril (LOTENSIN) 40 MG tablet Take 40 mg by mouth daily.     cloNIDine (CATAPRES) 0.3 MG tablet Take 0.3 mg 3 (three) times daily by mouth.  2   folic acid (FOLVITE) 1 MG tablet TAKE 1 TABLET(1 MG) BY MOUTH DAILY 90 tablet 3   furosemide (LASIX) 20 MG tablet Take 20-40 mg by mouth daily. Take 20 mg by mouth on odd days and 40 mg on even days     hydrALAZINE (APRESOLINE) 100 MG tablet Take 100 mg by mouth 2 (two) times daily.     pantoprazole (PROTONIX) 20 MG tablet Take 1 tablet (20 mg total) by mouth daily. 30 tablet 2   rosuvastatin (CRESTOR) 5 MG tablet Take 5 mg by mouth at bedtime.  2   sodium bicarbonate 650 MG tablet Take 650 mg by mouth 2 (two) times daily.     warfarin (COUMADIN) 4 MG tablet Take 4 mg by mouth daily.     amoxicillin (AMOXIL) 500 MG tablet Take 2,000 mg by mouth See admin instructions. Take 2000 mg by mouth one hour prior to dental procedures (Patient not taking: No sig reported)     cyclobenzaprine (FLEXERIL) 5 MG tablet Take 1 tablet (5 mg total) by  mouth 3 (three) times daily as needed for muscle spasms. (Patient not taking: No sig reported) 15 tablet 0   oxyCODONE-acetaminophen (PERCOCET) 5-325 MG tablet Take 1 tablet by mouth every 6 (six) hours as needed for severe pain. (Patient not taking: No sig reported) 8 tablet 0   potassium chloride (KLOR-CON) 10 MEQ tablet Take 10 mEq by  mouth daily. (Patient not taking: No sig reported)     traMADol (ULTRAM) 50 MG tablet Take 1 tablet (50 mg total) by mouth every 12 (twelve) hours as needed. (Patient not taking: Reported on 07/16/2021) 12 tablet 0   warfarin (COUMADIN) 6 MG tablet Take 6 mg by mouth. (Patient not taking: No sig reported)     No current facility-administered medications for this visit.     PHYSICAL EXAMINATION: ECOG PERFORMANCE STATUS: 1 - Symptomatic but completely ambulatory Vitals:   07/16/21 1029  BP: (!) 142/70  Pulse: (!) 55  Resp: 18  Temp: (!) 97.4 F (36.3 C)   Filed Weights   07/16/21 1029  Weight: 174 lb 3.2 oz (79 kg)    Physical Exam Constitutional:      General: She is not in acute distress.    Appearance: She is not diaphoretic.  HENT:     Head: Normocephalic and atraumatic.     Nose: Nose normal.     Mouth/Throat:     Pharynx: No oropharyngeal exudate.  Eyes:     General: No scleral icterus.       Left eye: No discharge.     Pupils: Pupils are equal, round, and reactive to light.  Neck:     Vascular: No JVD.  Cardiovascular:     Rate and Rhythm: Normal rate. Rhythm irregular.     Heart sounds: Murmur heard.  Pulmonary:     Effort: Pulmonary effort is normal. No respiratory distress.     Breath sounds: No wheezing or rales.  Chest:     Chest wall: No tenderness.  Abdominal:     General: Bowel sounds are normal. There is no distension.     Palpations: Abdomen is soft. There is no mass.     Tenderness: There is no abdominal tenderness. There is no rebound.  Musculoskeletal:        General: No tenderness. Normal range of motion.      Cervical back: Normal range of motion and neck supple.  Lymphadenopathy:     Cervical: No cervical adenopathy.  Skin:    General: Skin is warm and dry.     Findings: No erythema or rash.  Neurological:     Mental Status: She is alert and oriented to person, place, and time.     Cranial Nerves: No cranial nerve deficit.     Motor: No abnormal muscle tone.     Coordination: Coordination normal.  Psychiatric:        Mood and Affect: Mood and affect normal.    LABORATORY DATA:  I have reviewed the data as listed Lab Results  Component Value Date   WBC 4.3 07/16/2021   HGB 9.1 (L) 07/16/2021   HCT 30.7 (L) 07/16/2021   MCV 93.3 07/16/2021   PLT 181 07/16/2021   Recent Labs    01/04/21 1023 03/01/21 0645 03/17/21 1008  NA 138 142 138  K 4.3 4.4 3.9  CL 106 108 108  CO2 22  --  23  GLUCOSE 98 104* 102*  BUN 72* 84* 53*  CREATININE 2.54* 3.00* 2.48*  CALCIUM 9.3  --  9.4  GFRNONAA 20*  --  20*  PROT 7.1  --   --   ALBUMIN 4.0  --   --   AST 16  --   --   ALT 11  --   --   ALKPHOS 75  --   --   BILITOT 0.7  --   --  Iron/TIBC/Ferritin/ %Sat    Component Value Date/Time   IRON 45 04/13/2021 1126   TIBC 307 04/13/2021 1126   FERRITIN 156 04/13/2021 1126   IRONPCTSAT 15 04/13/2021 1126    08/29/2017 Folate 2.8,  B12 825              01/02/2018 TSH 3.05                                                                                                                                                        RADIOGRAPHIC STUDIES: I have personally reviewed the radiological images as listed and agreed with the findings in the report. 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  Mild aneurysmal dilation of the proximal abdominal aorta 3.7 cm.  Femorofemoral crossover graft and right axillo femoral bypass graft noted.  Rounded  fluid collection surrounds the distal aspect of the axillofemoral bypass graft.  Presumably postoperative seroma.  Moderate stool burden in the colon.  2 cm soft tissue nodule in the left posterior mediastinum presumably mildly enlarged lymph node.  This could be followed with a repeat CT chest in 6 months to assess stability.  Cardiomegaly.  CAD.  ASSESSMENT & PLAN:  1. Anemia of chronic renal failure, stage 4 (severe) ([place])   2. Encounter for ESA (erythropoietin stimulating agent) anemia management   3. Iron deficiency anemia, unspecified iron deficiency anemia type   4. Atypical lobular hyperplasia ([facility]) of breast    #Anemia of chronic kidney disease Labs reviewed and discussed with patient Hemoglobin has improved to 9.1. Proceed with Retacrit 60,000 unit today.  #History of GI bleeding due to chronic anticoagulation. I will check an iron level at the next visit.  #History of folic acid deficiency, loud heart murmur.  Continue long-term folic acid supplementation.   #Atypical lobular hyperplasia: AH confers a moderate increase in the risk of subsequent breast cancer (relative risk 3 to 5).  Recommend active surveillance. Options of chemoprevention was discussed with the patient and potential side effects were discussed.  Patient is not interested.  With patient's multiple comorbidities, her life expectancy is likely less than 10 years and I think is reasonable just continue active surveillance.  She agrees with the plan.  All questions were answered. The patient knows to call the clinic with any problems questions or concerns. Orders Placed This Encounter  Procedures    CBC with Differential/Platelet    Standing Status:   Future    Standing Expiration Date:   07/16/2022   Ferritin    Standing Status:   Future    Standing Expiration Date:   07/16/2022   Iron and TIBC    Standing Status:   Future    Standing Expiration Date:   07/16/2022   Hemoglobin and hematocrit, blood    Standing Status:   Future    Standing Expiration Date:   07/16/2022   Hemoglobin and hematocrit, blood    Standing Status:   Future    Standing Expiration Date:   07/16/2022    Return of visit:   H&H in 4 weeks, 8 weeks, +/- retacrit, lab MD in 12 weeks +/- retacrit.    Earlie Server, MD, PhD 07/16/21

## 2021-07-21 ENCOUNTER — Ambulatory Visit: Payer: Medicare Other

## 2021-07-21 ENCOUNTER — Ambulatory Visit: Payer: Medicare Other | Admitting: Oncology

## 2021-07-21 ENCOUNTER — Other Ambulatory Visit: Payer: Medicare Other

## 2021-07-27 ENCOUNTER — Ambulatory Visit: Payer: Medicare Other

## 2021-07-27 ENCOUNTER — Ambulatory Visit: Payer: Medicare Other | Admitting: Oncology

## 2021-07-27 ENCOUNTER — Other Ambulatory Visit: Payer: Medicare Other

## 2021-08-09 ENCOUNTER — Encounter: Payer: Self-pay | Admitting: Oncology

## 2021-08-17 ENCOUNTER — Inpatient Hospital Stay: Payer: Medicare Other

## 2021-08-17 ENCOUNTER — Inpatient Hospital Stay: Payer: Medicare Other | Attending: Oncology

## 2021-08-17 VITALS — BP 114/64 | HR 60

## 2021-08-17 DIAGNOSIS — N184 Chronic kidney disease, stage 4 (severe): Secondary | ICD-10-CM | POA: Diagnosis not present

## 2021-08-17 DIAGNOSIS — D631 Anemia in chronic kidney disease: Secondary | ICD-10-CM | POA: Diagnosis present

## 2021-08-17 DIAGNOSIS — D509 Iron deficiency anemia, unspecified: Secondary | ICD-10-CM

## 2021-08-17 LAB — HEMOGLOBIN AND HEMATOCRIT, BLOOD
HCT: 28.5 % — ABNORMAL LOW (ref 36.0–46.0)
Hemoglobin: 8.8 g/dL — ABNORMAL LOW (ref 12.0–15.0)

## 2021-08-17 MED ORDER — EPOETIN ALFA-EPBX 20000 UNIT/ML IJ SOLN
60000.0000 [IU] | Freq: Once | INTRAMUSCULAR | Status: AC
  Administered 2021-08-17: 60000 [IU] via SUBCUTANEOUS
  Filled 2021-08-17: qty 3

## 2021-09-14 ENCOUNTER — Other Ambulatory Visit: Payer: Self-pay

## 2021-09-14 ENCOUNTER — Inpatient Hospital Stay: Payer: Medicare Other | Attending: Oncology

## 2021-09-14 ENCOUNTER — Inpatient Hospital Stay: Payer: Medicare Other

## 2021-09-14 VITALS — BP 118/76 | HR 74

## 2021-09-14 DIAGNOSIS — N184 Chronic kidney disease, stage 4 (severe): Secondary | ICD-10-CM | POA: Diagnosis not present

## 2021-09-14 DIAGNOSIS — D631 Anemia in chronic kidney disease: Secondary | ICD-10-CM | POA: Diagnosis present

## 2021-09-14 LAB — HEMOGLOBIN AND HEMATOCRIT, BLOOD
HCT: 30 % — ABNORMAL LOW (ref 36.0–46.0)
Hemoglobin: 9 g/dL — ABNORMAL LOW (ref 12.0–15.0)

## 2021-09-14 MED ORDER — EPOETIN ALFA-EPBX 40000 UNIT/ML IJ SOLN: 60000.0000 [IU] | Freq: Once | INTRAMUSCULAR | Status: DC

## 2021-09-14 MED ORDER — EPOETIN ALFA-EPBX 10000 UNIT/ML IJ SOLN
20000.0000 [IU] | Freq: Once | INTRAMUSCULAR | Status: AC
  Administered 2021-09-14: 20000 [IU] via SUBCUTANEOUS
  Filled 2021-09-14: qty 2

## 2021-09-14 MED ORDER — EPOETIN ALFA-EPBX 40000 UNIT/ML IJ SOLN
40000.0000 [IU] | Freq: Once | INTRAMUSCULAR | Status: AC
  Administered 2021-09-14: 40000 [IU] via SUBCUTANEOUS
  Filled 2021-09-14: qty 1

## 2021-10-12 ENCOUNTER — Encounter: Payer: Self-pay | Admitting: Oncology

## 2021-10-12 ENCOUNTER — Inpatient Hospital Stay: Payer: Medicare Other

## 2021-10-12 ENCOUNTER — Other Ambulatory Visit: Payer: Self-pay

## 2021-10-12 ENCOUNTER — Inpatient Hospital Stay: Payer: Medicare Other | Attending: Oncology

## 2021-10-12 ENCOUNTER — Inpatient Hospital Stay: Payer: Medicare Other | Admitting: Oncology

## 2021-10-12 VITALS — BP 128/74 | HR 50 | Temp 96.4°F | Wt 169.0 lb

## 2021-10-12 DIAGNOSIS — D649 Anemia, unspecified: Secondary | ICD-10-CM

## 2021-10-12 DIAGNOSIS — N184 Chronic kidney disease, stage 4 (severe): Secondary | ICD-10-CM | POA: Diagnosis not present

## 2021-10-12 DIAGNOSIS — D631 Anemia in chronic kidney disease: Secondary | ICD-10-CM

## 2021-10-12 DIAGNOSIS — D509 Iron deficiency anemia, unspecified: Secondary | ICD-10-CM

## 2021-10-12 DIAGNOSIS — Z79899 Other long term (current) drug therapy: Secondary | ICD-10-CM

## 2021-10-12 DIAGNOSIS — N6099 Unspecified benign mammary dysplasia of unspecified breast: Secondary | ICD-10-CM | POA: Diagnosis not present

## 2021-10-12 LAB — CBC WITH DIFFERENTIAL/PLATELET
Abs Immature Granulocytes: 0.01 10*3/uL (ref 0.00–0.07)
Basophils Absolute: 0 10*3/uL (ref 0.0–0.1)
Basophils Relative: 1 %
Eosinophils Absolute: 0.2 10*3/uL (ref 0.0–0.5)
Eosinophils Relative: 4 %
HCT: 30.5 % — ABNORMAL LOW (ref 36.0–46.0)
Hemoglobin: 9.2 g/dL — ABNORMAL LOW (ref 12.0–15.0)
Immature Granulocytes: 0 %
Lymphocytes Relative: 15 %
Lymphs Abs: 0.6 10*3/uL — ABNORMAL LOW (ref 0.7–4.0)
MCH: 28.1 pg (ref 26.0–34.0)
MCHC: 30.2 g/dL (ref 30.0–36.0)
MCV: 93.3 fL (ref 80.0–100.0)
Monocytes Absolute: 0.4 10*3/uL (ref 0.1–1.0)
Monocytes Relative: 10 %
Neutro Abs: 2.9 10*3/uL (ref 1.7–7.7)
Neutrophils Relative %: 70 %
Platelets: 170 10*3/uL (ref 150–400)
RBC: 3.27 MIL/uL — ABNORMAL LOW (ref 3.87–5.11)
RDW: 15 % (ref 11.5–15.5)
WBC: 4.1 10*3/uL (ref 4.0–10.5)
nRBC: 0 % (ref 0.0–0.2)

## 2021-10-12 LAB — IRON AND TIBC
Iron: 41 ug/dL (ref 28–170)
Saturation Ratios: 13 % (ref 10.4–31.8)
TIBC: 322 ug/dL (ref 250–450)
UIBC: 281 ug/dL

## 2021-10-12 LAB — FERRITIN: Ferritin: 123 ng/mL (ref 11–307)

## 2021-10-12 MED ORDER — EPOETIN ALFA-EPBX 40000 UNIT/ML IJ SOLN: 60000.0000 [IU] | Freq: Once | INTRAMUSCULAR | Status: DC

## 2021-10-12 MED ORDER — EPOETIN ALFA-EPBX 40000 UNIT/ML IJ SOLN
40000.0000 [IU] | Freq: Once | INTRAMUSCULAR | Status: AC
  Administered 2021-10-12: 40000 [IU] via SUBCUTANEOUS
  Filled 2021-10-12: qty 1

## 2021-10-12 MED ORDER — EPOETIN ALFA-EPBX 10000 UNIT/ML IJ SOLN
20000.0000 [IU] | Freq: Once | INTRAMUSCULAR | Status: AC
  Administered 2021-10-12: 20000 [IU] via SUBCUTANEOUS
  Filled 2021-10-12: qty 2

## 2021-10-12 NOTE — Progress Notes (Signed)
Hematology/Oncology Follow up note [facility] [phone] [phone]   Patient Care Team: Maryland Pink, MD as PCP - General (Family Medicine) Earlie Server, MD as Consulting Physician (Hematology and Oncology)  REFERRING PROVIDER: Maryland Pink, MD  REASON FOR VISIT Follow up for treatment of anemia of chronic kidney disease.  HISTORY OF PRESENTING ILLNESS:  Jeanette Romero is a  70 y.o.  female with PMH listed below who was referred to me for evaluation of labile INR. She has extensive cardiac comorbidities including history of aortic mechanic valve replacement, aneurysm of aorta, PAF, Coronary artery disease. She was on Amiodarone and also had drug induced hyperthyroidism.  She was sent to me for evaluation of labile INR.  Extensive review of her medical records from primary care physician's office showed  10/27/2017 INR 5.9,  10/24/2017 INR 4.9 10/12/2017 INR 1.2 10/07/2017 INR 6  10/04/2017 INR 8.1   08/11/2017 INR 7.5  05/24/2017 INR 3.4 04/26/2017 INR 3.5 03/15/2017 INR 3.4 01/31/2017 INR 2.7  08/29/2017 Folate 2.8,  B12 825 and the patient was started on folic acid supplementation.  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 Amiodarone was stopped. And patient was referred to endocrinologist for evaluation. She was found out to have Graves' disease and was started on prednisone. Last TSH that was done in the [facility] showed a TSH of 4.8.  # 3/9-3/13/2020  hospitalized due to weakness and noted to have acute on chronic anemia, black tarry stool. Status post EGD showing minimal gastritis and possible Barrett esophagus.  Biopsy was not done due to elevated INR and recent bleed.  Colonoscopy showing 3 to 4 mm sized sessile polyps.  Biopsies were  not done.  Patient received PRBC transfusions during her admission  # #Abnormal CT finding: Patient had CT abdomen pelvis without contrast on 06/28/2018 for evaluation of right lower abdomen swelling.  There were incidental findings of 2 cm soft tissue nodule in the left posterior mediastinum.  Questionable enlarged lymph nodes.  I have previously discussed with patient about need of repeating CT scan of chest to evaluate stability.  I have discussed with her on multiple occasion. Patient declined.     # 01/07/2021, bilateral breast screening mammogram 01/19/2021 right diagnostic mammogram: 1.8 cm intraductal mass in the retroareolar right breast at 6 o'clock.  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  Negative for malignancy Right breast 8:00 biopsy showed cystically dilated duct with associated histiocytes and a sclerosis.  Usual ductal hyperplasia.  Negative for atypia or malignancy.  03/01/2021, patient underwent right breast excisional biopsy.  Pathology showed  -Superior medial site :sclerosing intraductal papilloma, focal atypical lobular hyperplasia, extensive duct dilatation with periductal sclerosis.  Usual ductal hyperplasia. -Inferior lateral site: Focal atypical lobular hyperplasia, ductal dilatation with periductal sclerosis.  Usual ductal hyperplasia.    Post operation, patient developed seroma patient was seen by Dr. Peyton Najjar on 04/13/2021.  Patient is healing slowly but adequately.  No signs of infection.  #Atypical lobular hyperplasia: AH confers a moderate increase in the risk of subsequent breast cancer (relative risk 3 to 5).  Recommend active surveillance. Options of chemoprevention was discussed with the patient and  potential side effects were discussed.  Patient is not interested.  With patient's  multiple comorbidities, her life expectancy is likely less than 10 years and I think is reasonable just continue active surveillance.  She agrees with the plan.  INTERVAL HISTORY 70 y.o. female  presents for follow-up for management of anemia of CKD.  Patient reports feeling well today.  She is on anticoagulation, no bleeding events.  Patient asking if she could do Retacrit injections every 8 weeks instead of every 4 weeks.  She is behind her copayment for the Retacrit.  With fixed income, she experiences financial stress.  Review of Systems  Constitutional:  Positive for malaise/fatigue. Negative for chills, fever and weight loss.  HENT:  Negative for sore throat.   Eyes:  Negative for redness.  Respiratory:  Negative for cough, shortness of breath and wheezing.   Cardiovascular:  Negative for chest pain, palpitations and leg swelling.  Gastrointestinal:  Negative for abdominal pain, blood in stool, nausea and vomiting.  Genitourinary:  Negative for dysuria.  Musculoskeletal:  Negative for myalgias.  Skin:  Negative for rash.  Neurological:  Negative for dizziness, tingling and tremors.  Endo/Heme/Allergies:  Does not bruise/bleed easily.  Psychiatric/Behavioral:  Negative for hallucinations.    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)    a.) measured 1.9 x 1.9 on 11/15/2019   Anxiety    Aortic atherosclerosis (HCC)    Aortic valve regurgitation    severe; s/p AVR   Aortic valve replaced    CKD (chronic kidney disease) stage 4, GFR 15-29 ml/min (HCC)    Coronary artery disease    Current use of long term anticoagulation    warfarin; managed at [facility]   Dizziness    Edema    GERD (gastroesophageal reflux disease)    Gout    Hypercholesterolemia    Hypertension    Intraductal papilloma  of breast, 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   Permanent atrial fibrillation (HCC)    Pseudoaneurysm of aorta (HCC)    a.) proximal; enhancing portion measured 4.6 x 3.8 x 3.2 cm on 11/15/2019   PVD (peripheral vascular disease) ([place])    s/p axillofemoral bypass   Shortness of breath dyspnea    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    SURGICAL HISTORY: Past Surgical History:  Procedure Laterality Date   ABDOMINAL HYSTERECTOMY     AORTIC VALVE REPLACEMENT     BREAST BIOPSY Right 01/29/2021   [place] Bx, 6:00 vision clip, path pending    BREAST BIOPSY Right 01/29/2021   [place] Bx, 8:00 Ribbon clip, path pending    CARDIAC CATHETERIZATION     CARDIAC CATHETERIZATION N/A 05/07/2015   Procedure: Left Heart Cath;  Surgeon: [name], MD;  Location: [place] [facility];  Service: Cardiovascular;  Laterality: N/A;   CARDIAC VALVE REPLACEMENT     COLONOSCOPY Left 02/20/2019   Procedure: COLONOSCOPY;  Surgeon: Virgel Manifold, MD;  Location: [facility] ENDOSCOPY;  Service: Endoscopy;  Laterality: Left;   COLONOSCOPY N/A 02/21/2019   Procedure: COLONOSCOPY;  Surgeon: Virgel Manifold, MD;  Location: [facility] ENDOSCOPY;  Service: Endoscopy;  Laterality: N/A;   ELECTROPHYSIOLOGIC STUDY N/A 05/05/2015   Procedure: Cardioversion;  Surgeon: [name], MD;  Location: [facility] ORS;  Service: Cardiovascular;  Laterality: N/A;  ENTEROSCOPY N/A 02/21/2019   Procedure: ENTEROSCOPY;  Surgeon: Virgel Manifold, MD;  Location: [facility] ENDOSCOPY;  Service: Endoscopy;  Laterality: N/A;   ESOPHAGOGASTRODUODENOSCOPY Left 02/19/2019   Procedure: ESOPHAGOGASTRODUODENOSCOPY (EGD);  Surgeon: Virgel Manifold, MD;  Location: [facility];  Service: Endoscopy;  Laterality: Left;   EXCISION OF BREAST BIOPSY Right 03/01/2021   Procedure: EXCISION OF BREAST BIOPSY w/ radiofrequency tag;  Surgeon: Herbert Pun, MD;  Location: [facility] ORS;  Service: General;  Laterality: Right;   REPLACEMENT TOTAL KNEE BILATERAL       SOCIAL HISTORY: Social History   Socioeconomic History   Marital status: Married    Spouse name: Not on file   Number of children: Not on file   Years of education: Not on file   Highest education level: Not on file  Occupational History   Not on file  Tobacco Use   Smoking status: Former    Packs/day: 0.50    Years: 20.00    Pack years: 10.00    Types: Cigarettes    Quit date: 2004    Years since quitting: 18.8   Smokeless tobacco: Never  Vaping Use   Vaping Use: Never used  Substance and Sexual Activity   Alcohol use: Yes    Alcohol/week: 0.0 standard drinks    Comment: occas   Drug use: No   Sexual activity: Not on file  Other Topics Concern   Not on file  Social History Narrative   Not on file   Social Determinants of Health   Financial Resource Strain: Not on file  Food Insecurity: Not on file  Transportation Needs: Not on file  Physical Activity: Not on file  Stress: Not on file  Social Connections: Not on file  Intimate Partner Violence: Not on file    FAMILY HISTORY: Family History  Problem Relation Age of Onset   Hypertension Mother    Colon cancer Mother    Prostate cancer Father    Kidney cancer Brother    Prostate cancer Brother    Prostate cancer Brother    Breast cancer Neg Hx     ALLERGIES:  is allergic to atorvastatin.  MEDICATIONS:  Current Outpatient Medications  Medication Sig Dispense Refill   ALPRAZolam (XANAX) 0.25 MG tablet Take 0.25 mg by mouth 2 (two) times daily.     amLODipine (NORVASC) 10 MG tablet Take 10 mg by mouth daily.      aspirin EC 81 MG tablet Take 81 mg by mouth daily.     benazepril (LOTENSIN) 40 MG tablet Take 40 mg by mouth daily.     cloNIDine (CATAPRES) 0.3 MG tablet Take 0.3 mg 3 (three) times daily by mouth.  2   folic acid (FOLVITE) 1 MG tablet TAKE 1 TABLET(1 MG) BY MOUTH DAILY 90 tablet 3   furosemide (LASIX) 20 MG tablet Take 20-40 mg by mouth daily. Take 20 mg by mouth on odd days and 40 mg on  even days     hydrALAZINE (APRESOLINE) 100 MG tablet Take 100 mg by mouth 2 (two) times daily.     pantoprazole (PROTONIX) 20 MG tablet Take 1 tablet (20 mg total) by mouth daily. 30 tablet 2   rosuvastatin (CRESTOR) 5 MG tablet Take 5 mg by mouth at bedtime.  2   sodium bicarbonate 650 MG tablet Take 650 mg by mouth 2 (two) times daily.     warfarin (COUMADIN) 4 MG tablet Take 4 mg by mouth daily.     amoxicillin (AMOXIL)  500 MG tablet Take 2,000 mg by mouth See admin instructions. Take 2000 mg by mouth one hour prior to dental procedures (Patient not taking: No sig reported)     cyclobenzaprine (FLEXERIL) 5 MG tablet Take 1 tablet (5 mg total) by mouth 3 (three) times daily as needed for muscle spasms. (Patient not taking: No sig reported) 15 tablet 0   oxyCODONE-acetaminophen (PERCOCET) 5-325 MG tablet Take 1 tablet by mouth every 6 (six) hours as needed for severe pain. (Patient not taking: No sig reported) 8 tablet 0   potassium chloride (KLOR-CON) 10 MEQ tablet Take 10 mEq by mouth daily. (Patient not taking: No sig reported)     traMADol (ULTRAM) 50 MG tablet Take 1 tablet (50 mg total) by mouth every 12 (twelve) hours as needed. (Patient not taking: No sig reported) 12 tablet 0   warfarin (COUMADIN) 6 MG tablet Take 6 mg by mouth. (Patient not taking: No sig reported)     No current facility-administered medications for this visit.     PHYSICAL EXAMINATION: ECOG PERFORMANCE STATUS: 1 - Symptomatic but completely ambulatory Vitals:   10/12/21 1041  BP: 128/74  Pulse: (!) 50  Temp: (!) 96.4 F (35.8 C)   Filed Weights   10/12/21 1041  Weight: 169 lb (76.7 kg)    Physical Exam Constitutional:      General: She is not in acute distress.    Appearance: She is not diaphoretic.  HENT:     Head: Normocephalic and atraumatic.     Nose: Nose normal.     Mouth/Throat:     Pharynx: No oropharyngeal exudate.  Eyes:     General: No scleral icterus.       Left eye: No discharge.      Pupils: Pupils are equal, round, and reactive to light.  Neck:     Vascular: No JVD.  Cardiovascular:     Rate and Rhythm: Normal rate. Rhythm irregular.     Heart sounds: Murmur heard.  Pulmonary:     Effort: Pulmonary effort is normal. No respiratory distress.     Breath sounds: No wheezing or rales.  Chest:     Chest wall: No tenderness.  Abdominal:     General: Bowel sounds are normal. There is no distension.     Palpations: Abdomen is soft. There is no mass.     Tenderness: There is no abdominal tenderness. There is no rebound.  Musculoskeletal:        General: No tenderness. Normal range of motion.     Cervical back: Normal range of motion and neck supple.  Lymphadenopathy:     Cervical: No cervical adenopathy.  Skin:    General: Skin is warm and dry.     Findings: No erythema or rash.  Neurological:     Mental Status: She is alert and oriented to person, place, and time.     Cranial Nerves: No cranial nerve deficit.     Motor: No abnormal muscle tone.     Coordination: Coordination normal.  Psychiatric:        Mood and Affect: Mood and affect normal.    LABORATORY DATA:  I have reviewed the data as listed Lab Results  Component Value Date   WBC 4.1 10/12/2021   HGB 9.2 (L) 10/12/2021   HCT 30.5 (L) 10/12/2021   MCV 93.3 10/12/2021   PLT 170 10/12/2021   Recent Labs    01/04/21 1023 03/01/21 0645 03/17/21 1008  NA 138 142 138  K 4.3 4.4 3.9  CL 106 108 108  CO2 22  --  23  GLUCOSE 98 104* 102*  BUN 72* 84* 53*  CREATININE 2.54* 3.00* 2.48*  CALCIUM 9.3  --  9.4  GFRNONAA 20*  --  20*  PROT 7.1  --   --   ALBUMIN 4.0  --   --   AST 16  --   --   ALT 11  --   --   ALKPHOS 75  --   --   BILITOT 0.7  --   --     Iron/TIBC/Ferritin/ %Sat    Component Value Date/Time   IRON 41 10/12/2021 0952   TIBC 322 10/12/2021 0952   FERRITIN 123 10/12/2021 0952   IRONPCTSAT 13 10/12/2021 0952    08/29/2017 Folate 2.8,  B12 825              01/02/2018 TSH  3.05                                                                                                                                                        RADIOGRAPHIC STUDIES: I have personally reviewed the radiological images as listed and agreed with the findings in the report. 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  Mild aneurysmal dilation of the proximal abdominal aorta 3.7 cm.  Femorofemoral crossover graft and right axillo femoral bypass graft noted.  Rounded fluid collection surrounds the distal aspect of the axillofemoral bypass graft.  Presumably postoperative seroma.  Moderate stool burden in the colon.  2 cm soft tissue nodule in the left posterior mediastinum presumably mildly enlarged lymph node.  This could be followed with a repeat CT chest in 6 months to assess stability.  Cardiomegaly.  CAD.  ASSESSMENT & PLAN:  1. Anemia of chronic renal failure, stage 4 (severe) ([place])   2. Encounter for ESA (erythropoietin stimulating agent) anemia management   3. Atypical lobular hyperplasia ([facility]) of breast    #Anemia of chronic kidney disease Labs are reviewed and discussed with patient. Hemoglobin less than 10. Proceed with Retacrit 60,000 unit today.  Erythropoietin therapy every 8 weeks most likely will not be sufficient enough to keep her hemoglobin stable.  Patient is made aware about the potential risk of further decrease of hemoglobin.  She prefers to have a try with erythropoietin therapy every 8 weeks.  Patient  knows that she should call us if she experiences worsening of fatigue symptoms.  She declines Education officer, museum referral.  #History of GI bleeding due to chronic anticoagulation. Stable iron store.  #History of folic acid deficiency, loud heart murmur.  Continue long-term folic acid supplementation.  #Atypical lobular hyperplasia.  She had decision has made not to proceed with chemoprevention.  Continue annual mammogram.   All questions were answered. The patient knows to call the clinic with any problems questions or concerns. Orders Placed This Encounter  Procedures   Hemoglobin and hematocrit, blood    Standing Status:   Future    Standing Expiration Date:   10/12/2022   CBC with Differential/Platelet    Standing Status:   Future    Standing Expiration Date:   10/12/2022   Ferritin    Standing Status:   Future    Standing Expiration Date:   10/12/2022   Iron and TIBC    Standing Status:   Future    Standing Expiration Date:   10/12/2022    Return of visit:   H&H in , 8 weeks, +/- retacrit, lab MD in 16 weeks +/- retacrit.    Earlie Server, MD, PhD 10/12/21

## 2021-12-07 ENCOUNTER — Inpatient Hospital Stay: Payer: Medicare Other

## 2021-12-07 ENCOUNTER — Other Ambulatory Visit: Payer: Self-pay

## 2021-12-07 ENCOUNTER — Inpatient Hospital Stay: Payer: Medicare Other | Attending: Oncology

## 2021-12-07 VITALS — BP 134/71 | HR 73

## 2021-12-07 DIAGNOSIS — N184 Chronic kidney disease, stage 4 (severe): Secondary | ICD-10-CM | POA: Insufficient documentation

## 2021-12-07 DIAGNOSIS — D631 Anemia in chronic kidney disease: Secondary | ICD-10-CM

## 2021-12-07 LAB — HEMOGLOBIN AND HEMATOCRIT, BLOOD
HCT: 27.5 % — ABNORMAL LOW (ref 36.0–46.0)
Hemoglobin: 8.2 g/dL — ABNORMAL LOW (ref 12.0–15.0)

## 2021-12-07 MED ORDER — EPOETIN ALFA-EPBX 40000 UNIT/ML IJ SOLN: 60000.0000 [IU] | Freq: Once | INTRAMUSCULAR | Status: DC

## 2021-12-07 MED ORDER — EPOETIN ALFA-EPBX 10000 UNIT/ML IJ SOLN
20000.0000 [IU] | Freq: Once | INTRAMUSCULAR | Status: AC
  Administered 2021-12-07: 10:00:00 20000 [IU] via SUBCUTANEOUS
  Filled 2021-12-07: qty 2

## 2021-12-07 MED ORDER — EPOETIN ALFA-EPBX 40000 UNIT/ML IJ SOLN
40000.0000 [IU] | Freq: Once | INTRAMUSCULAR | Status: AC
  Administered 2021-12-07: 10:00:00 40000 [IU] via SUBCUTANEOUS
  Filled 2021-12-07: qty 1

## 2021-12-14 ENCOUNTER — Other Ambulatory Visit: Payer: Self-pay | Admitting: Family Medicine

## 2021-12-14 DIAGNOSIS — Z1231 Encounter for screening mammogram for malignant neoplasm of breast: Secondary | ICD-10-CM

## 2022-01-18 ENCOUNTER — Other Ambulatory Visit: Payer: Self-pay

## 2022-01-18 ENCOUNTER — Ambulatory Visit
Admission: RE | Admit: 2022-01-18 | Discharge: 2022-01-18 | Disposition: A | Discharge status: Home / Self Care | Payer: Medicare Other | Source: Ambulatory Visit | Attending: Family Medicine | Admitting: Family Medicine

## 2022-01-18 DIAGNOSIS — Z1231 Encounter for screening mammogram for malignant neoplasm of breast: Secondary | ICD-10-CM | POA: Insufficient documentation

## 2022-01-25 ENCOUNTER — Other Ambulatory Visit: Payer: Self-pay | Admitting: *Deleted

## 2022-01-25 DIAGNOSIS — D509 Iron deficiency anemia, unspecified: Secondary | ICD-10-CM

## 2022-01-25 DIAGNOSIS — N184 Chronic kidney disease, stage 4 (severe): Secondary | ICD-10-CM

## 2022-01-26 ENCOUNTER — Other Ambulatory Visit: Payer: Self-pay | Admitting: Family Medicine

## 2022-01-26 DIAGNOSIS — N6489 Other specified disorders of breast: Secondary | ICD-10-CM

## 2022-01-26 DIAGNOSIS — N6320 Unspecified lump in the left breast, unspecified quadrant: Secondary | ICD-10-CM

## 2022-01-26 DIAGNOSIS — N63 Unspecified lump in unspecified breast: Secondary | ICD-10-CM

## 2022-01-26 DIAGNOSIS — R928 Other abnormal and inconclusive findings on diagnostic imaging of breast: Secondary | ICD-10-CM

## 2022-02-01 ENCOUNTER — Inpatient Hospital Stay: Payer: Medicare Other | Attending: Oncology

## 2022-02-01 ENCOUNTER — Other Ambulatory Visit: Payer: Self-pay

## 2022-02-01 DIAGNOSIS — D631 Anemia in chronic kidney disease: Secondary | ICD-10-CM | POA: Insufficient documentation

## 2022-02-01 DIAGNOSIS — D509 Iron deficiency anemia, unspecified: Secondary | ICD-10-CM

## 2022-02-01 DIAGNOSIS — N184 Chronic kidney disease, stage 4 (severe): Secondary | ICD-10-CM | POA: Insufficient documentation

## 2022-02-01 LAB — CBC WITH DIFFERENTIAL/PLATELET
Abs Immature Granulocytes: 0.01 10*3/uL (ref 0.00–0.07)
Basophils Absolute: 0 10*3/uL (ref 0.0–0.1)
Basophils Relative: 1 %
Eosinophils Absolute: 0.1 10*3/uL (ref 0.0–0.5)
Eosinophils Relative: 3 %
HCT: 27.5 % — ABNORMAL LOW (ref 36.0–46.0)
Hemoglobin: 8.3 g/dL — ABNORMAL LOW (ref 12.0–15.0)
Immature Granulocytes: 0 %
Lymphocytes Relative: 13 %
Lymphs Abs: 0.6 10*3/uL — ABNORMAL LOW (ref 0.7–4.0)
MCH: 28.5 pg (ref 26.0–34.0)
MCHC: 30.2 g/dL (ref 30.0–36.0)
MCV: 94.5 fL (ref 80.0–100.0)
Monocytes Absolute: 0.4 10*3/uL (ref 0.1–1.0)
Monocytes Relative: 8 %
Neutro Abs: 3.3 10*3/uL (ref 1.7–7.7)
Neutrophils Relative %: 75 %
Platelets: 171 10*3/uL (ref 150–400)
RBC: 2.91 MIL/uL — ABNORMAL LOW (ref 3.87–5.11)
RDW: 14.4 % (ref 11.5–15.5)
WBC: 4.4 10*3/uL (ref 4.0–10.5)
nRBC: 0 % (ref 0.0–0.2)

## 2022-02-01 LAB — IRON AND TIBC
Iron: 53 ug/dL (ref 28–170)
Saturation Ratios: 16 % (ref 10.4–31.8)
TIBC: 325 ug/dL (ref 250–450)
UIBC: 272 ug/dL

## 2022-02-01 LAB — FERRITIN: Ferritin: 121 ng/mL (ref 11–307)

## 2022-02-03 ENCOUNTER — Other Ambulatory Visit: Payer: Self-pay

## 2022-02-03 ENCOUNTER — Inpatient Hospital Stay: Payer: Medicare Other

## 2022-02-03 ENCOUNTER — Encounter: Payer: Self-pay | Admitting: Oncology

## 2022-02-03 ENCOUNTER — Inpatient Hospital Stay: Payer: Medicare Other | Admitting: Oncology

## 2022-02-03 VITALS — BP 149/83 | HR 78 | Temp 97.6°F | Wt 167.0 lb

## 2022-02-03 DIAGNOSIS — N6099 Unspecified benign mammary dysplasia of unspecified breast: Secondary | ICD-10-CM | POA: Diagnosis not present

## 2022-02-03 DIAGNOSIS — D649 Anemia, unspecified: Secondary | ICD-10-CM

## 2022-02-03 DIAGNOSIS — N184 Chronic kidney disease, stage 4 (severe): Secondary | ICD-10-CM | POA: Diagnosis not present

## 2022-02-03 DIAGNOSIS — D631 Anemia in chronic kidney disease: Secondary | ICD-10-CM

## 2022-02-03 DIAGNOSIS — Z79899 Other long term (current) drug therapy: Secondary | ICD-10-CM

## 2022-02-03 MED ORDER — EPOETIN ALFA-EPBX 40000 UNIT/ML IJ SOLN
40000.0000 [IU] | Freq: Once | INTRAMUSCULAR | Status: AC
  Administered 2022-02-03: 40000 [IU] via SUBCUTANEOUS
  Filled 2022-02-03: qty 1

## 2022-02-03 MED ORDER — EPOETIN ALFA-EPBX 10000 UNIT/ML IJ SOLN
20000.0000 [IU] | Freq: Once | INTRAMUSCULAR | Status: AC
  Administered 2022-02-03: 20000 [IU] via SUBCUTANEOUS
  Filled 2022-02-03: qty 2

## 2022-02-03 MED ORDER — EPOETIN ALFA-EPBX 40000 UNIT/ML IJ SOLN: 60000.0000 [IU] | Freq: Once | INTRAMUSCULAR | Status: DC

## 2022-02-03 NOTE — Progress Notes (Signed)
Hematology/Oncology Progress note [phone] [phone]      Patient Care Team: Maryland Pink, MD as PCP - General (Family Medicine) Earlie Server, MD as Consulting Physician (Hematology and Oncology)  REFERRING PROVIDER: Maryland Pink, MD  REASON FOR VISIT Follow up for treatment of anemia of chronic kidney disease.  HISTORY OF PRESENTING ILLNESS:  Jeanette Romero is a  71 y.o.  female with PMH listed below who was referred to me for evaluation of labile INR. She has extensive cardiac comorbidities including history of aortic mechanic valve replacement, aneurysm of aorta, PAF, Coronary artery disease. She was on Amiodarone and also had drug induced hyperthyroidism.  She was sent to me for evaluation of labile INR.  Extensive review of her medical records from primary care physician's office showed  10/27/2017 INR 5.9,  10/24/2017 INR 4.9 10/12/2017 INR 1.2 10/07/2017 INR 6  10/04/2017 INR 8.1   08/11/2017 INR 7.5  05/24/2017 INR 3.4 04/26/2017 INR 3.5 03/15/2017 INR 3.4 01/31/2017 INR 2.7  08/29/2017 Folate 2.8,  B12 825 and the patient was started on folic acid supplementation.  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 Amiodarone was stopped. And patient was referred to endocrinologist for evaluation. She was found out to have Graves' disease and was started on prednisone. Last TSH that was done in the [facility] system showed a TSH of 4.8.  # 3/9-3/13/2020  hospitalized due to weakness and noted to have acute on chronic anemia, black tarry stool. Status post EGD showing minimal gastritis and possible Barrett esophagus.  Biopsy was not done due to elevated INR and recent bleed.  Colonoscopy showing 3 to 4 mm sized sessile polyps.  Biopsies were not done.  Patient received  PRBC transfusions during her admission  # #Abnormal CT finding: Patient had CT abdomen pelvis without contrast on 06/28/2018 for evaluation of right lower abdomen swelling.  There were incidental findings of 2 cm soft tissue nodule in the left posterior mediastinum.  Questionable enlarged lymph nodes.  I have previously discussed with patient about need of repeating CT scan of chest to evaluate stability.  I have discussed with her on multiple occasion. Patient declined.     # 01/07/2021, bilateral breast screening mammogram 01/19/2021 right diagnostic mammogram: 1.8 cm intraductal mass in the retroareolar right breast at 6 o'clock.  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  Negative for malignancy Right breast 8:00 biopsy showed cystically dilated duct with associated histiocytes and a sclerosis.  Usual ductal hyperplasia.  Negative for atypia or malignancy.  03/01/2021, patient underwent right breast excisional biopsy.  Pathology showed  -Superior medial site :sclerosing intraductal papilloma, focal atypical lobular hyperplasia, extensive duct dilatation with periductal sclerosis.  Usual ductal hyperplasia. -Inferior lateral site: Focal atypical lobular hyperplasia, ductal dilatation with periductal sclerosis.  Usual ductal hyperplasia.    Post operation, patient developed seroma patient was seen by Dr. Peyton Najjar on 04/13/2021.  Patient is healing slowly but adequately.  No signs of infection.  #Atypical lobular hyperplasia: AH confers a moderate increase in the risk of subsequent breast cancer (relative risk 3 to 5).  Recommend active surveillance. Options of chemoprevention was discussed with the patient and potential side  effects were discussed.  Patient is not interested.  With patient's multiple comorbidities, her life  expectancy is likely less than 10 years and I think is reasonable just continue active surveillance.  She agrees with the plan.  INTERVAL HISTORY 71 y.o. female  presents for follow-up for management of anemia of CKD.  Patient reports feeling well today.  Chronic fatigue, not worse. She gets Retacrit 60,000 units every 8 weeks  Review of Systems  Constitutional:  Positive for malaise/fatigue. Negative for chills, fever and weight loss.  HENT:  Negative for sore throat.   Eyes:  Negative for redness.  Respiratory:  Negative for cough, shortness of breath and wheezing.   Cardiovascular:  Negative for chest pain, palpitations and leg swelling.  Gastrointestinal:  Negative for abdominal pain, blood in stool, nausea and vomiting.  Genitourinary:  Negative for dysuria.  Musculoskeletal:  Negative for myalgias.  Skin:  Negative for rash.  Neurological:  Negative for dizziness, tingling and tremors.  Endo/Heme/Allergies:  Does not bruise/bleed easily.  Psychiatric/Behavioral:  Negative for hallucinations.    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)    a.) measured 1.9 x 1.9 on 11/15/2019   Anxiety    Aortic atherosclerosis (HCC)    Aortic valve regurgitation    severe; s/p AVR   Aortic valve replaced    CKD (chronic kidney disease) stage 4, GFR 15-29 ml/min (HCC)    Coronary artery disease    Current use of long term anticoagulation    warfarin; managed at [facility]   Dizziness    Edema    GERD (gastroesophageal reflux disease)    Gout    Hypercholesterolemia    Hypertension    Intraductal papilloma of breast, 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   Permanent atrial fibrillation (HCC)    Pseudoaneurysm of aorta (HCC)    a.) proximal; enhancing portion measured 4.6 x 3.8 x 3.2 cm on 11/15/2019   PVD (peripheral  vascular disease) ([place])    s/p axillofemoral bypass   Shortness of breath dyspnea    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    SURGICAL HISTORY: Past Surgical History:  Procedure Laterality Date   ABDOMINAL HYSTERECTOMY     AORTIC VALVE REPLACEMENT     BREAST BIOPSY Right 01/29/2021   [place] Bx, 6:00 vision clip, path pending    BREAST BIOPSY Right 01/29/2021   [place] Bx, 8:00 Ribbon clip, path pending    CARDIAC CATHETERIZATION     CARDIAC CATHETERIZATION N/A 05/07/2015   Procedure: Left Heart Cath;  Surgeon: [name], MD;  Location: [facility];  Service: Cardiovascular;  Laterality: N/A;   CARDIAC VALVE REPLACEMENT     COLONOSCOPY Left 02/20/2019   Procedure: COLONOSCOPY;  Surgeon: Virgel Manifold, MD;  Location: [facility];  Service: Endoscopy;  Laterality: Left;   COLONOSCOPY N/A 02/21/2019   Procedure: COLONOSCOPY;  Surgeon: Virgel Manifold, MD;  Location: [facility];  Service: Endoscopy;  Laterality: N/A;   ELECTROPHYSIOLOGIC STUDY N/A 05/05/2015   Procedure: Cardioversion;  Surgeon: [name], MD;  Location: [facility];  Service: Cardiovascular;  Laterality: N/A;   ENTEROSCOPY N/A 02/21/2019   Procedure: ENTEROSCOPY;  Surgeon: Virgel Manifold, MD;  Location: [facility];  Service: Endoscopy;  Laterality: N/A;   ESOPHAGOGASTRODUODENOSCOPY Left 02/19/2019   Procedure:  ESOPHAGOGASTRODUODENOSCOPY (EGD);  Surgeon: Virgel Manifold, MD;  [facility];  Service: Endoscopy;  Laterality: Left;   EXCISION OF BREAST BIOPSY Right 03/01/2021   Procedure: EXCISION OF BREAST BIOPSY w/ radiofrequency tag;  Surgeon: Herbert Pun, MD;  Location: [facility];  Service: General;  Laterality: Right;   REPLACEMENT TOTAL KNEE BILATERAL      SOCIAL HISTORY: Social History   Socioeconomic History   Marital status: Married    Spouse name: Not on file   Number of children: Not on file   Years of education: Not on file   Highest  education level: Not on file  Occupational History   Not on file  Tobacco Use   Smoking status: Former    Packs/day: 0.50    Years: 20.00    Pack years: 10.00    Types: Cigarettes    Quit date: 2004    Years since quitting: 19.1   Smokeless tobacco: Never  Vaping Use   Vaping Use: Never used  Substance and Sexual Activity   Alcohol use: Yes    Alcohol/week: 0.0 standard drinks    Comment: occas   Drug use: No   Sexual activity: Not on file  Other Topics Concern   Not on file  Social History Narrative   Not on file   Social Determinants of Health   Financial Resource Strain: Not on file  Food Insecurity: Not on file  Transportation Needs: Not on file  Physical Activity: Not on file  Stress: Not on file  Social Connections: Not on file  Intimate Partner Violence: Not on file    FAMILY HISTORY: Family History  Problem Relation Age of Onset   Hypertension Mother    Colon cancer Mother    Prostate cancer Father    Kidney cancer Brother    Prostate cancer Brother    Prostate cancer Brother    Breast cancer Neg Hx     ALLERGIES:  is allergic to atorvastatin.  MEDICATIONS:  Current Outpatient Medications  Medication Sig Dispense Refill   ALPRAZolam (XANAX) 0.25 MG tablet Take 0.25 mg by mouth 2 (two) times daily.     amLODipine (NORVASC) 10 MG tablet Take 10 mg by mouth daily.      aspirin EC 81 MG tablet Take 81 mg by mouth daily.     benazepril (LOTENSIN) 40 MG tablet Take 40 mg by mouth daily.     cloNIDine (CATAPRES) 0.3 MG tablet Take 0.3 mg 3 (three) times daily by mouth.  2   folic acid (FOLVITE) 1 MG tablet TAKE 1 TABLET(1 MG) BY MOUTH DAILY 90 tablet 3   furosemide (LASIX) 20 MG tablet Take 20-40 mg by mouth daily. Take 20 mg by mouth on odd days and 40 mg on even days     hydrALAZINE (APRESOLINE) 100 MG tablet Take 100 mg by mouth 2 (two) times daily.     pantoprazole (PROTONIX) 20 MG tablet Take 1 tablet (20 mg total) by mouth daily. 30 tablet 2    rosuvastatin (CRESTOR) 5 MG tablet Take 5 mg by mouth at bedtime.  2   sodium bicarbonate 650 MG tablet Take 650 mg by mouth 2 (two) times daily.     warfarin (COUMADIN) 4 MG tablet Take 4 mg by mouth daily.     amoxicillin (AMOXIL) 500 MG tablet Take 2,000 mg by mouth See admin instructions. Take 2000 mg by mouth one hour prior to dental procedures (Patient not taking: Reported on 04/16/2021)  cyclobenzaprine (FLEXERIL) 5 MG tablet Take 1 tablet (5 mg total) by mouth 3 (three) times daily as needed for muscle spasms. (Patient not taking: Reported on 04/16/2021) 15 tablet 0   oxyCODONE-acetaminophen (PERCOCET) 5-325 MG tablet Take 1 tablet by mouth every 6 (six) hours as needed for severe pain. (Patient not taking: Reported on 04/16/2021) 8 tablet 0   potassium chloride (KLOR-CON) 10 MEQ tablet Take 10 mEq by mouth daily. (Patient not taking: Reported on 04/16/2021)     traMADol (ULTRAM) 50 MG tablet Take 1 tablet (50 mg total) by mouth every 12 (twelve) hours as needed. (Patient not taking: Reported on 07/16/2021) 12 tablet 0   warfarin (COUMADIN) 6 MG tablet Take 6 mg by mouth. (Patient not taking: Reported on 04/16/2021)     No current facility-administered medications for this visit.     PHYSICAL EXAMINATION: ECOG PERFORMANCE STATUS: 1 - Symptomatic but completely ambulatory Vitals:   02/03/22 0928  BP: (!) 149/83  Pulse: 78  Temp: 97.6 F (36.4 C)   Filed Weights   02/03/22 0928  Weight: 167 lb (75.8 kg)    Physical Exam Constitutional:      General: She is not in acute distress.    Appearance: She is not diaphoretic.  HENT:     Head: Normocephalic and atraumatic.     Nose: Nose normal.     Mouth/Throat:     Pharynx: No oropharyngeal exudate.  Eyes:     General: No scleral icterus.       Left eye: No discharge.     Pupils: Pupils are equal, round, and reactive to light.  Neck:     Vascular: No JVD.  Cardiovascular:     Rate and Rhythm: Normal rate. Rhythm irregular.     Heart  sounds: Murmur heard.  Pulmonary:     Effort: Pulmonary effort is normal. No respiratory distress.     Breath sounds: No wheezing or rales.  Chest:     Chest wall: No tenderness.  Abdominal:     General: Bowel sounds are normal. There is no distension.     Palpations: Abdomen is soft. There is no mass.     Tenderness: There is no abdominal tenderness. There is no rebound.  Musculoskeletal:        General: No tenderness. Normal range of motion.     Cervical back: Normal range of motion and neck supple.     Comments: Trace ankle edema bilaterally  Lymphadenopathy:     Cervical: No cervical adenopathy.  Skin:    General: Skin is warm and dry.     Findings: No erythema or rash.  Neurological:     Mental Status: She is alert and oriented to person, place, and time.     Cranial Nerves: No cranial nerve deficit.     Motor: No abnormal muscle tone.     Coordination: Coordination normal.  Psychiatric:        Mood and Affect: Mood and affect normal.    LABORATORY DATA:  I have reviewed the data as listed Lab Results  Component Value Date   WBC 4.4 02/01/2022   HGB 8.3 (L) 02/01/2022   HCT 27.5 (L) 02/01/2022   MCV 94.5 02/01/2022   PLT 171 02/01/2022   Recent Labs    03/01/21 0645 03/17/21 1008  NA 142 138  K 4.4 3.9  CL 108 108  CO2  --  23  GLUCOSE 104* 102*  BUN 84* 53*  CREATININE 3.00* 2.48*  CALCIUM  --  9.4  GFRNONAA  --  20*    Iron/TIBC/Ferritin/ %Sat    Component Value Date/Time   IRON 53 02/01/2022 0915   TIBC 325 02/01/2022 0915   FERRITIN 121 02/01/2022 0915   IRONPCTSAT 16 02/01/2022 0915    08/29/2017 Folate 2.8,  B12 825              01/02/2018 TSH 3.05                                                                                                                                                        RADIOGRAPHIC STUDIES: I have personally reviewed the radiological images as listed and agreed with the findings in the report. 06/28/2018 CT abdomen  pelvis without contrast  Extensive vascular disease with evidence of chronic calcified dissection in the visualized distal thoracic aorta and throughout the abdominal aorta.  Mild aneurysmal dilation of the proximal abdominal aorta 3.7 cm.  Femorofemoral crossover graft and right axillo femoral bypass graft noted.  Rounded fluid collection surrounds the distal aspect of the axillofemoral bypass graft.  Presumably postoperative seroma.  Moderate stool burden in the colon.  2 cm soft tissue nodule in the left posterior mediastinum presumably mildly enlarged lymph node.  This could be followed with a repeat CT chest in 6 months to assess stability.  Cardiomegaly.  CAD.  ASSESSMENT & PLAN:  1. Anemia of chronic renal failure, stage 4 (severe) ([place])   2. Encounter for ESA (erythropoietin stimulating agent) anemia management   3. Atypical lobular hyperplasia (ALH) of breast    #Anemia of chronic kidney disease Labs reviewed and discussed with patient Reviewed hemoglobin is less than 10. Proceed with Retacrit 60,000 units today. We discussed that every 8 weeks treatment is not frequent enough to keep her hemoglobin stable.  Patient prefers to keep Retacrit every 8 weeks due to financial reasons. We also discussed about further improve her iron stores with IV iron treatments and the patient prefers to defer.  #History of GI bleeding due to chronic anticoagulation.  #History of folic acid deficiency, loud heart murmur.  Continue  long-term folic acid supplementation.  #Atypical lobular hyperplasia.  Patient declined chemoprevention.  Annual mammogram- primary care provider's  office.  All questions were answered. The patient knows to call the clinic with any problems questions or concerns. Orders Placed This Encounter  Procedures   CBC with Differential/Platelet    Standing Status:   Future    Standing Expiration Date:   02/03/2023   CBC with Differential/Platelet    Standing Status:   Future    Standing Expiration Date:   02/03/2023   Ferritin    Standing Status:   Future    Standing Expiration Date:   02/03/2023   Iron and TIBC    Standing Status:   Future    Standing Expiration Date:   02/03/2023    Return of visit:   H&H in , 8 weeks, +/- retacrit, lab MD in 16 weeks +/- retacrit.    Earlie Server, MD, PhD 02/03/22

## 2022-02-08 ENCOUNTER — Ambulatory Visit
Admission: RE | Admit: 2022-02-08 | Discharge: 2022-02-08 | Disposition: A | Discharge status: Home / Self Care | Payer: Medicare Other | Source: Ambulatory Visit | Attending: Family Medicine | Admitting: Family Medicine

## 2022-02-08 ENCOUNTER — Other Ambulatory Visit: Payer: Self-pay

## 2022-02-08 DIAGNOSIS — R928 Other abnormal and inconclusive findings on diagnostic imaging of breast: Secondary | ICD-10-CM | POA: Diagnosis not present

## 2022-02-08 DIAGNOSIS — N63 Unspecified lump in unspecified breast: Secondary | ICD-10-CM | POA: Diagnosis present

## 2022-02-08 DIAGNOSIS — N6489 Other specified disorders of breast: Secondary | ICD-10-CM | POA: Insufficient documentation

## 2022-02-14 ENCOUNTER — Other Ambulatory Visit: Payer: Self-pay | Admitting: Family Medicine

## 2022-02-14 DIAGNOSIS — R928 Other abnormal and inconclusive findings on diagnostic imaging of breast: Secondary | ICD-10-CM

## 2022-02-14 DIAGNOSIS — N63 Unspecified lump in unspecified breast: Secondary | ICD-10-CM

## 2022-02-22 ENCOUNTER — Other Ambulatory Visit: Payer: Self-pay

## 2022-02-22 ENCOUNTER — Ambulatory Visit
Admission: RE | Admit: 2022-02-22 | Discharge: 2022-02-22 | Disposition: A | Discharge status: Home / Self Care | Payer: Medicare Other | Source: Ambulatory Visit | Attending: Family Medicine | Admitting: Family Medicine

## 2022-02-22 DIAGNOSIS — C50912 Malignant neoplasm of unspecified site of left female breast: Secondary | ICD-10-CM

## 2022-02-22 DIAGNOSIS — N6322 Unspecified lump in the left breast, upper inner quadrant: Secondary | ICD-10-CM | POA: Diagnosis not present

## 2022-02-22 DIAGNOSIS — N6321 Unspecified lump in the left breast, upper outer quadrant: Secondary | ICD-10-CM | POA: Diagnosis present

## 2022-02-22 DIAGNOSIS — N63 Unspecified lump in unspecified breast: Secondary | ICD-10-CM | POA: Insufficient documentation

## 2022-02-22 DIAGNOSIS — R928 Other abnormal and inconclusive findings on diagnostic imaging of breast: Secondary | ICD-10-CM

## 2022-02-22 HISTORY — DX: Malignant neoplasm of unspecified site of left female breast: C50.912

## 2022-02-23 DIAGNOSIS — C50919 Malignant neoplasm of unspecified site of unspecified female breast: Secondary | ICD-10-CM

## 2022-02-24 ENCOUNTER — Telehealth: Payer: Self-pay | Admitting: Licensed Clinical Social Worker

## 2022-02-24 LAB — SURGICAL PATHOLOGY

## 2022-02-24 NOTE — Telephone Encounter (Signed)
Called patient to explain reason for genetics referral. Patient declines at this time. ?

## 2022-02-28 ENCOUNTER — Encounter: Payer: Self-pay | Admitting: Oncology

## 2022-02-28 ENCOUNTER — Inpatient Hospital Stay: Payer: Medicare Other

## 2022-02-28 ENCOUNTER — Other Ambulatory Visit: Payer: Self-pay

## 2022-02-28 ENCOUNTER — Inpatient Hospital Stay: Payer: Medicare Other | Attending: Oncology | Admitting: Oncology

## 2022-02-28 VITALS — BP 146/81 | HR 70 | Temp 97.8°F | Resp 20 | Wt 167.0 lb

## 2022-02-28 DIAGNOSIS — Z7901 Long term (current) use of anticoagulants: Secondary | ICD-10-CM | POA: Insufficient documentation

## 2022-02-28 DIAGNOSIS — I4821 Permanent atrial fibrillation: Secondary | ICD-10-CM | POA: Insufficient documentation

## 2022-02-28 DIAGNOSIS — E538 Deficiency of other specified B group vitamins: Secondary | ICD-10-CM | POA: Insufficient documentation

## 2022-02-28 DIAGNOSIS — N6041 Mammary duct ectasia of right breast: Secondary | ICD-10-CM | POA: Insufficient documentation

## 2022-02-28 DIAGNOSIS — N184 Chronic kidney disease, stage 4 (severe): Secondary | ICD-10-CM | POA: Diagnosis not present

## 2022-02-28 DIAGNOSIS — R011 Cardiac murmur, unspecified: Secondary | ICD-10-CM | POA: Insufficient documentation

## 2022-02-28 DIAGNOSIS — C50212 Malignant neoplasm of upper-inner quadrant of left female breast: Secondary | ICD-10-CM | POA: Diagnosis not present

## 2022-02-28 DIAGNOSIS — D631 Anemia in chronic kidney disease: Secondary | ICD-10-CM

## 2022-02-28 DIAGNOSIS — Z7189 Other specified counseling: Secondary | ICD-10-CM

## 2022-02-28 DIAGNOSIS — Z170 Estrogen receptor positive status [ER+]: Secondary | ICD-10-CM | POA: Diagnosis not present

## 2022-02-28 DIAGNOSIS — C50919 Malignant neoplasm of unspecified site of unspecified female breast: Secondary | ICD-10-CM

## 2022-02-28 DIAGNOSIS — Z79899 Other long term (current) drug therapy: Secondary | ICD-10-CM | POA: Diagnosis not present

## 2022-02-28 DIAGNOSIS — Z7982 Long term (current) use of aspirin: Secondary | ICD-10-CM | POA: Diagnosis not present

## 2022-02-28 DIAGNOSIS — E0500 Thyrotoxicosis with diffuse goiter without thyrotoxic crisis or storm: Secondary | ICD-10-CM | POA: Diagnosis not present

## 2022-02-28 DIAGNOSIS — Z87891 Personal history of nicotine dependence: Secondary | ICD-10-CM | POA: Diagnosis not present

## 2022-02-28 DIAGNOSIS — Z952 Presence of prosthetic heart valve: Secondary | ICD-10-CM | POA: Insufficient documentation

## 2022-02-28 DIAGNOSIS — N6099 Unspecified benign mammary dysplasia of unspecified breast: Secondary | ICD-10-CM | POA: Diagnosis not present

## 2022-02-28 NOTE — Progress Notes (Signed)
?Hematology/Oncology Progress note ?[phone] [phone] ?  ? ? ? ?Patient Care Team: ?Maryland Pink, MD as PCP - General (Family Medicine) ?Earlie Server, MD as Consulting Physician (Hematology and Oncology) ?Theodore Demark, RN as Oncology Nurse Navigator ? ?REFERRING PROVIDER: ?Maryland Pink, MD ? ?REASON FOR VISIT ?Follow up for treatment of anemia of chronic kidney disease. ? ?HISTORY OF PRESENTING ILLNESS:  ?Jeanette Romero has extensive cardiac comorbidities including history of aortic mechanic valve replacement, aneurysm of aorta, PAF, Coronary artery disease. Jeanette Romero was on Amiodarone and also had drug induced hyperthyroidism.  ?Jeanette Romero was sent to me for evaluation of labile INR.  ?Extensive review of her medical records from primary care physician's office showed  ?10/27/2017 INR 5.9,  ?10/24/2017 INR 4.9 ?10/12/2017 INR 1.2 ?10/07/2017 INR 6 ? 10/04/2017 INR 8.1  ? 08/11/2017 INR 7.5 ? 05/24/2017 INR 3.4 ?04/26/2017 INR 3.5 ?03/15/2017 INR 3.4 ?01/31/2017 INR 2.7 ? ?08/29/2017 Folate 2.8,  B12 825 and the patient was started on folic acid supplementation. ? 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 Amiodarone was stopped. And patient was referred to endocrinologist for evaluation. Jeanette Romero was found out to have Graves' disease and was started on prednisone. Last TSH that was done in the [facility] system showed a TSH of 4.8. ? ?# 3/9-3/13/2020  hospitalized due to weakness and noted to have acute on chronic anemia, black tarry stool. 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  Biopsy was not done due to elevated INR and recent bleed.  Colonoscopy showing 3 to 4 mm sized sessile polyps.  Biopsies were not done.  Patient received PRBC transfusions during her admission ? ?# #Abnormal CT finding:  Patient had CT abdomen pelvis without contrast on 06/28/2018 for evaluation of right lower abdomen swelling.  There were incidental findings of 2 cm soft tissue nodule in the left posterior mediastinum.  Questionable enlarged lymph nodes.  ?I have previously discussed with patient about need of repeating CT scan of chest to evaluate stability.  I have discussed with her on multiple occasion. Patient declined.  ? ?# Chronic anemia due to CKD on erythropoietin therapy ? ?# 01/07/2021, bilateral breast screening mammogram ?01/19/2021 right diagnostic mammogram: 1.8 cm intraductal mass in the retroareolar right ?breast at 6 o'clock.  There is a hypoechoic 3 mm round mass in the right breast at 8 o'clock, 4 cm from the nipple.No evidence of right axillary lymphadenopathy ?01/29/2021, right breast 6:00 mass biopsy showed sclerosing intraductal papilloma.  Negative for malignancy ?Right breast 8:00 biopsy showed cystically dilated duct with associated histiocytes and a sclerosis.  Usual ductal hyperplasia.  Negative for atypia or malignancy. ? ?03/01/2021, patient underwent right breast excisional biopsy.  Pathology showed  ?-Superior medial site :sclerosing intraductal papilloma, focal atypical lobular hyperplasia, extensive duct dilatation with periductal sclerosis.  Usual ductal hyperplasia. ?-Inferior lateral site: Focal atypical lobular hyperplasia, ductal dilatation with periductal sclerosis.  Usual ductal hyperplasia.   ? ?Post operation, patient developed seroma patient was seen by Dr. Peyton Najjar on 04/13/2021.  Patient is healing slowly but adequately.  No signs of infection. ? ?#Atypical lobular hyperplasia: AH confers a moderate increase in the risk of subsequent breast cancer (relative risk 3 to 5).  Recommend active surveillance. Options of chemoprevention was discussed with the patient and potential side effects were discussed.  Patient is not interested.  ?  With patient's multiple comorbidities, her life expectancy is  likely less than 10 years and I think is reasonable just continue active surveillance.  Jeanette Romero agrees with the plan. ? ?INTERVAL HISTORY ?71 y.o. female  presents for follow-up for evaluation of breast cancer ?Jeanette Romero has chronic anemia due to CKD, Jeanette Romero has been on Retacrit 60,000 units every 8 weeks [patient prefers].  ?History of atypical lobular hyperplasia of right breast status post surgical resection, declined chemoprevention. ?01/19/2022, bilateral screening mammogram showed possible mass and asymmetry in the left breast. ?02/08/2022, unilateral left breast diagnostic mammogram showed suspicious mass 7 x 6 x 6 mm in left breast, 1030 o'clock.  Anterior to the suspicious mass is an additional small lobular mass which is not visualized on ultrasound. No left axillary adenopathy identified. 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  ER 90% positive, PR 51-90%, HER2 IHC negative ? Left breast upper outer quadrant stereotactic core needle biopsy showed benign mammary parenchyma with mild fibrocystic changes and focal usual ductal hyperplasia.  Negative for atypical proliferative breast disease. ? ?Patient present for evaluation and discussion of management of left breast cancer. ? ? ? ?Review of Systems  ?Constitutional:  Positive for malaise/fatigue. Negative for chills, fever and weight loss.  ?HENT:  Negative for sore throat.   ?Eyes:  Negative for redness.  ?Respiratory:  Negative for cough, shortness of breath and wheezing.   ?Cardiovascular:  Negative for chest pain, palpitations and leg swelling.  ?Gastrointestinal:  Negative for abdominal pain, blood in stool, nausea and vomiting.  ?Genitourinary:  Negative for dysuria.  ?Musculoskeletal:  Negative for myalgias.  ?Skin:  Negative for rash.  ?Neurological:  Negative for dizziness, tingling and tremors.  ?Endo/Heme/Allergies:  Does not bruise/bleed easily.   ?Psychiatric/Behavioral:  Negative for hallucinations.   ? ?MEDICAL HISTORY:  ?Past Medical History:  ?Diagnosis Date  ? Anemia in chronic kidney disease (CKD) 11/14/2017  ? Aneurysm of infrarenal abdominal aorta   ? a.) measured 2.3 x 2.8 cm on 11/15/2019  ? 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 a.) measured 1.9 x 1.9 on 11/15/2019  ? Anxiety   ? Aortic atherosclerosis ([place])   ? Aortic valve regurgitation   ? severe; s/p AVR  ? Aortic valve replaced   ? CKD (chronic kidney disease) stage 4, GFR 15-29 ml/min (HCC)   ? Coronary artery disease   ? Current use of long term anticoagulation   ? warfarin; [facility] at [facility]  ? Dizziness   ? Edema   ? GERD (gastroesophageal reflux disease)   ? Gout   ? Hypercholesterolemia   ? Hypertension   ? Intraductal papilloma of breast, 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  ? Permanent atrial fibrillation ([place])   ? Pseudoaneurysm of aorta (HCC)   ? a.) proximal; enhancing portion measured 4.6 x 3.8 x 3.2 cm on 11/15/2019  ? PVD (peripheral vascular disease) ([place])   ? s/p axillofemoral bypass  ? Shortness of breath dyspnea   ? 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 ? ?SURGICAL HISTORY: ?Past Surgical History:  ?Procedure Laterality Date  ? ABDOMINAL HYSTERECTOMY    ? AORTIC VALVE REPLACEMENT    ? BREAST BIOPSY Right 01/29/2021  ? [place] Bx, 6:00 vision clip, path pending   ? BREAST BIOPSY Right 01/29/2021  ? [place] Bx, 8:00 Ribbon clip, path pending   ? CARDIAC CATHETERIZATION    ? CARDIAC CATHETERIZATION N/A 05/07/2015  ? Procedure: Left Heart Cath;  Surgeon: [name], MD;  Location: [place] [facility];  Service: Cardiovascular;  Laterality: N/A;  ? CARDIAC VALVE REPLACEMENT    ? COLONOSCOPY Left 02/20/2019  ? Procedure: COLONOSCOPY;  Surgeon: Virgel Manifold, MD;  Location: [facility] ENDOSCOPY;  Service: Endoscopy;  Laterality: Left;  ? COLONOSCOPY N/A 02/21/2019  ? Procedure: COLONOSCOPY;  Surgeon: Virgel Manifold, MD;  Location: [facility] ENDOSCOPY;  Service: Endoscopy;  Laterality: N/A;   ? ELECTROPHYSIOLOGIC STUDY N/A 05/05/2015  ? Procedure: Cardioversion;  Surgeon: [name], MD;  Location: [facility] ORS;  Service: Cardiovascular;  Laterality: N/A;  ? ENTEROSCOPY N/A 02/21/2019  ? Proce

## 2022-03-01 ENCOUNTER — Other Ambulatory Visit: Payer: Self-pay | Admitting: General Surgery

## 2022-03-01 ENCOUNTER — Ambulatory Visit: Payer: Self-pay | Admitting: General Surgery

## 2022-03-01 DIAGNOSIS — Z7901 Long term (current) use of anticoagulants: Secondary | ICD-10-CM

## 2022-03-01 DIAGNOSIS — Z170 Estrogen receptor positive status [ER+]: Secondary | ICD-10-CM

## 2022-03-01 NOTE — H&P (View-Only) (Signed)
PATIENT PROFILE: ?[name] is a 71 y.o. female who presents to the Clinic for consultation at the request of Dr. Kary Kos for evaluation of left breast cancer. ? ?PCP:  Lovie Macadamia, MD ? ?HISTORY OF PRESENT ILLNESS: ?[name] reports had her screening mammogram which showed a mass and asymmetry of the left breast.  Diagnostic mammogram and ultrasound confirmed the 0.7 cm mass of the left breast at 1030 o'clock position.  This led to core biopsy.  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  Biopsy came back with an ER positive, PR positive HER2 negative. ? 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  Discussion of chemoprevention was done but patient was not interested.  I personally reviewed the medical oncologist multiple previous evaluations. ? ?Family history of breast cancer: Denies ?Family history of other cancers: Mother with colon cancer, brother with breast cancer ?Menarche: 10 ?Menopause: 37 ?Used OCP: Yes ?Used estrogen and progesterone therapy: Yes for 1 year ?History of Radiation to the chest: None ?Number of pregnancies: 2 ?Age of her pregnancy: 84 ?Previous breast biopsy: Yes, positive for invasive lobular hyperplasia. ? ?PROBLEM LIST: ?Problem List  Date Reviewed: 02/10/2022  ? ?       Noted  ? Aortic atherosclerosis (CMS-HCC) 03/05/2020  ? Thoracic aortic aneurysm without rupture 05/14/2019  ? Iron deficiency anemia secondary to blood loss (chronic) 03/19/2019  ? Colon polyps 03/19/2019  ? Long term (current) use of anticoagulants 09/05/2018  ? Anemia of chronic kidney failure, stage 4 (severe) (CMS-HCC) 03/05/2018  ? Hyperparathyroidism (CMS-HCC) 02/28/2018  ? CKD (chronic kidney disease) stage 4, GFR 15-29 ml/min (CMS-HCC) 01/02/2018  ? 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 Essential hypertension 10/05/2017  ? Permanent atrial fibrillation (CMS-HCC) 10/05/2017  ? Overview  ?  First noted in 2014.  Rx Amiodarone ?04/2015  DCCV ?Currently as of 10/05/17 on 400 daily.   ?  ?  ? Coronary artery disease involving native coronary artery of native heart without angina pectoris (Chronic) 10/05/2017  ? Overview  ?  04/2015 Cardiac Cath ([facility]): RPDA lesion, 50% stenosed, Inf Sept lesion, 50% stenosed, Mid Cx lesion, 50% stenosed, Prox LAD lesion, 50% stenosed, Mid RCA lesion, 40% stenosed.Patient has mild to moderate disease in mid LAD, distal RCA and mid LCX, and normal EF ,70% on echo. Advise medical treatment. ?  ?  ? Severe aortic valve regurgitation / s/p AVR (Chronic) 12/24/2014  ? Overview  ?  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 Norm Parcel, including an aortic valve resuspension and supra-coronary graft replacement of the ascending aorta with a 28 mm Dacron graft.  ?October 2004, after developing strep endocarditis of her resuspended aortic valve and, in underwent redo-sternotomy and St. Jude aortic valve replacement with a 21 mm mechanical valve. 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 ?  ?  ? Heart palpitations 01/16/2013  ? Shortness of breath 01/16/2013  ? 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 Overview  ?  Actually a dissection from arch to aortic bifurcation in pelvis.  True and false lumen with all major vessels off of the true lumen. 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 Norm Parcel, Repair included aortic valve resuspension and supra-coronary graft replacement of the ascending aorta with a 28 mm Dacron graft.  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 02/17/2004 right axillary to right femoral to left femoral bypass with 8 mm PTFE  ?04/2015  Aortic Root by echo: 5 cm ?09/2017 Aortic Root by echo: 4.9 cm ?01/2018 ABIs: Right 0.68, left 0.59. Bilat LE Arterial Duplex: RLE-Fem-fem bypass graft was visualized in its entirety, with no flow throughout, suggestive of occlusion.  The distal EIA was patent, with no evidence of  hemodynamically significant stenosis. Mild to moderate heterogeneous plaque was visualized at the CFA, with elevated velocities recorded, suggesting a > 50% stenosis by velocity criteria. Mild calcified plaque was visualized at the PFA, SFA and popliteal artery, with no evidence of hemodynamically significant stenosis. Multiple calcified shadows were noted at the ATA and PTA, can not exclude a significant stenosis or occlusion. The peroneal artery was not well visualized. The DPA was patent. LLE-Distal EIA was patent, with monophasic waveforms recorded, suggesting possible aorto-iliac arterial occlusive disease proximally. Mild calcified plaque was visualized at the CFA, PFA, SFA and popliteal artery, with no evidence of hemodynamically significant stenosis. Multiple calcified shadows were noted at the ATA and PTA, can not exclude a significant stenosis or occlusion. The peroneal artery was not well visualized. The DPA was patent. ?03/2018 Aortic Root diameter by Echo [facility]) 4.8cm ?06/28/2018 CTA Abd/Pelvis ([facility])-films uploaded to [facility]: Chronic appearing aortic dissection noted. Intimal flap is calcified. Maximum diameter of the visualized descending thoracic aorta 3.4 cm. Chronic appearing aortic dissection with calcified intimal flap. Maximum aortic diameter at the aortic hiatus measures 3.7 cm. Diffuse aortic and iliac calcifications. Fem-fem crossover graft and right axillofemoral bypass graft noted. Low-density structure noted around the distal aspect of the axillofemoral bypass graft measures 4.3 x 3.2 cm. This measures water density, presumably postoperative fluid collection/seroma. ?  ?  ? ? ?GENERAL REVIEW OF SYSTEMS:  ? ?General ROS: negative for - chills, fatigue, fever, weight gain or weight loss ?Allergy and Immunology ROS: negative for - hives  ?Hematological and Lymphatic ROS: negative for - bleeding problems or bruising, negative for palpable nodes ?Endocrine ROS: negative for - heat or cold  intolerance, hair changes ?Respiratory ROS: negative for - cough, shortness of breath or wheezing ?Cardiovascular ROS: no chest pain or palpitations ?GI ROS: negative for nausea, vomiting, abdominal pain, diarrhea, constipation ?Musculoskeletal ROS: negative for - joint swelling or muscle pain ?Neurological ROS: negative for - confusion, syncope ?Dermatological ROS: negative for pruritus and rash ?Psychiatric: negative for anxiety, depression, difficulty sleeping and memory loss ? ?MEDICATIONS: ?Current Outpatient Medications  ?Medication Sig Dispense Refill  ? ALPRAZolam (XANAX) 0.25 MG tablet TAKE ONE (1) TABLET BY MOUTH TWICE DAILY 60 tablet 5  ? amLODIPine (NORVASC) 10 MG tablet Take 1 tablet (10 mg total) by mouth once daily 90 tablet 3  ? amoxicillin (AMOXIL) 500 MG tablet TK 4 TS PO 1 HOUR PRIOR TO DENTAL APPOINTMENT    ? aspirin 81 MG chewable tablet Take 1 tablet (81 mg total) by mouth once daily 90 tablet 3  ? benazepriL (LOTENSIN) 40 MG tablet Take 1 tablet (40 mg total) by mouth once daily 30 tablet 11  ? cloNIDine HCL (CATAPRES) 0.3 MG tablet Take 1 tablet (0.3 mg total) by mouth 2 (two) times daily 270 tablet 3  ? folic acid (FOLVITE) 1 MG tablet Take by mouth once daily    ? FUROsemide (LASIX) 20 MG tablet TAKE 1 TABLET BY MOUTH ON ODD DAYS AND 2 TABLETS ON EVEN DAYS AS DIRECTED FOR EDEMA 180 tablet 3  ? hydrALAZINE (APRESOLINE) 100 MG tablet Take 1 tablet (100 mg total) by mouth 2 (two) times daily 180 tablet 3  ? pantoprazole (PROTONIX) 20  MG DR tablet Take 1 tablet (20 mg total) by mouth once daily 30 tablet 11  ? rosuvastatin (CRESTOR) 5 MG tablet Take 1 tablet (5 mg total) by mouth once daily 30 tablet 11  ? sodium bicarbonate 650 MG tablet Take 1 tablet (650 mg total) by mouth 2 (two) times daily 180 tablet 1  ? warfarin (COUMADIN) 4 MG tablet TAKE 1 TABLET BY MOUTH EVERY DAY 90 tablet 3  ? ?No current facility-administered medications for this visit.  ? ? ?ALLERGIES: ?Lipitor  [atorvastatin] ? ?PAST MEDICAL HISTORY: ?Past Medical History:  ?Diagnosis Date  ? Abnormal heart rhythm   ? Anemia of chronic kidney failure, stage 4 (severe) (CMS-HCC) 03/05/2018  ? Aneurysm of aorta (CMS-HCC) 2004  ? Aneurysm of aorta (CMS-HCC) 1/1

## 2022-03-01 NOTE — H&P (Signed)
PATIENT PROFILE: ?Jeanette Romero is a 71 y.o. female who presents to the Clinic for consultation at the request of Dr. Kary Kos for evaluation of left breast cancer. ? ?PCP:  Lovie Macadamia, MD ? ?HISTORY OF PRESENT ILLNESS: ?Ms. Jeanette Romero reports had her screening mammogram which showed a mass and asymmetry of the left breast.  Diagnostic mammogram and ultrasound confirmed the 0.7 cm mass of the left breast at 1030 o'clock position.  This led to core biopsy.  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  Biopsy came back with an ER positive, PR positive HER2 negative. ? 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  Discussion of chemoprevention was done but patient was not interested.  I personally reviewed the medical oncologist multiple previous evaluations. ? ?Family history of breast cancer: Denies ?Family history of other cancers: Mother with colon cancer, brother with breast cancer ?Menarche: 10 ?Menopause: 37 ?Used OCP: Yes ?Used estrogen and progesterone therapy: Yes for 1 year ?History of Radiation to the chest: None ?Number of pregnancies: 2 ?Age of her pregnancy: 69 ?Previous breast biopsy: Yes, positive for invasive lobular hyperplasia. ? ?PROBLEM LIST: ?Problem List  Date Reviewed: 02/10/2022  ? ?       Noted  ? Aortic atherosclerosis (CMS-HCC) 03/05/2020  ? Thoracic aortic aneurysm without rupture 05/14/2019  ? Iron deficiency anemia secondary to blood loss (chronic) 03/19/2019  ? Colon polyps 03/19/2019  ? Long term (current) use of anticoagulants 09/05/2018  ? Anemia of chronic kidney failure, stage 4 (severe) (CMS-HCC) 03/05/2018  ? Hyperparathyroidism (CMS-HCC) 02/28/2018  ? CKD (chronic kidney disease) stage 4, GFR 15-29 ml/min (CMS-HCC) 01/02/2018  ? 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 Essential hypertension 10/05/2017  ? Permanent atrial fibrillation (CMS-HCC) 10/05/2017  ? Overview  ?  First noted in 2014.  Rx Amiodarone ?04/2015  DCCV ?Currently as of 10/05/17 on 400 daily.   ?  ?  ? Coronary artery disease involving native coronary artery of native heart without angina pectoris (Chronic) 10/05/2017  ? Overview  ?  04/2015 Cardiac Cath ([facility]): RPDA lesion, 50% stenosed, Inf Sept lesion, 50% stenosed, Mid Cx lesion, 50% stenosed, Prox LAD lesion, 50% stenosed, Mid RCA lesion, 40% stenosed.Patient has mild to moderate disease in mid LAD, distal RCA and mid LCX, and normal EF ,70% on echo. Advise medical treatment. ?  ?  ? Severe aortic valve regurgitation / s/p AVR (Chronic) 12/24/2014  ? Overview  ?  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 Norm Parcel, including an aortic valve resuspension and supra-coronary graft replacement of the ascending aorta with a 28 mm Dacron graft.  ?October 2004, after developing strep endocarditis of her resuspended aortic valve and, in underwent redo-sternotomy and St. Jude aortic valve replacement with a 21 mm mechanical valve. 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 ?  ?  ? Heart palpitations 01/16/2013  ? Shortness of breath 01/16/2013  ? 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 Overview  ?  Actually a dissection from arch to aortic bifurcation in pelvis.  True and false lumen with all major vessels off of the true lumen. 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 Norm Parcel, Repair included aortic valve resuspension and supra-coronary graft replacement of the ascending aorta with a 28 mm Dacron graft.  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 02/17/2004 right axillary to right femoral to left femoral bypass with 8 mm PTFE  ?04/2015  Aortic Root by echo: 5 cm ?09/2017 Aortic Root by echo: 4.9 cm ?01/2018 ABIs: Right 0.68, left 0.59. Bilat LE Arterial Duplex: RLE-Fem-fem bypass graft was visualized in its entirety, with no flow throughout, suggestive of occlusion.  The distal EIA was patent, with no evidence of  hemodynamically significant stenosis. Mild to moderate heterogeneous plaque was visualized at the CFA, with elevated velocities recorded, suggesting a > 50% stenosis by velocity criteria. Mild calcified plaque was visualized at the PFA, SFA and popliteal artery, with no evidence of hemodynamically significant stenosis. Multiple calcified shadows were noted at the ATA and PTA, can not exclude a significant stenosis or occlusion. The peroneal artery was not well visualized. The DPA was patent. LLE-Distal EIA was patent, with monophasic waveforms recorded, suggesting possible aorto-iliac arterial occlusive disease proximally. Mild calcified plaque was visualized at the CFA, PFA, SFA and popliteal artery, with no evidence of hemodynamically significant stenosis. Multiple calcified shadows were noted at the ATA and PTA, can not exclude a significant stenosis or occlusion. The peroneal artery was not well visualized. The DPA was patent. ?03/2018 Aortic Root diameter by Echo [facility]) 4.8cm ?06/28/2018 CTA Abd/Pelvis ([facility])-films uploaded to [facility]: Chronic appearing aortic dissection noted. Intimal flap is calcified. Maximum diameter of the visualized descending thoracic aorta 3.4 cm. Chronic appearing aortic dissection with calcified intimal flap. Maximum aortic diameter at the aortic hiatus measures 3.7 cm. Diffuse aortic and iliac calcifications. Fem-fem crossover graft and right axillofemoral bypass graft noted. Low-density structure noted around the distal aspect of the axillofemoral bypass graft measures 4.3 x 3.2 cm. This measures water density, presumably postoperative fluid collection/seroma. ?  ?  ? ? ?GENERAL REVIEW OF SYSTEMS:  ? ?General ROS: negative for - chills, fatigue, fever, weight gain or weight loss ?Allergy and Immunology ROS: negative for - hives  ?Hematological and Lymphatic ROS: negative for - bleeding problems or bruising, negative for palpable nodes ?Endocrine ROS: negative for - heat or cold  intolerance, hair changes ?Respiratory ROS: negative for - cough, shortness of breath or wheezing ?Cardiovascular ROS: no chest pain or palpitations ?GI ROS: negative for nausea, vomiting, abdominal pain, diarrhea, constipation ?Musculoskeletal ROS: negative for - joint swelling or muscle pain ?Neurological ROS: negative for - confusion, syncope ?Dermatological ROS: negative for pruritus and rash ?Psychiatric: negative for anxiety, depression, difficulty sleeping and memory loss ? ?MEDICATIONS: ?Current Outpatient Medications  ?Medication Sig Dispense Refill  ? ALPRAZolam (XANAX) 0.25 MG tablet TAKE ONE (1) TABLET BY MOUTH TWICE DAILY 60 tablet 5  ? amLODIPine (NORVASC) 10 MG tablet Take 1 tablet (10 mg total) by mouth once daily 90 tablet 3  ? amoxicillin (AMOXIL) 500 MG tablet TK 4 TS PO 1 HOUR PRIOR TO DENTAL APPOINTMENT    ? aspirin 81 MG chewable tablet Take 1 tablet (81 mg total) by mouth once daily 90 tablet 3  ? benazepriL (LOTENSIN) 40 MG tablet Take 1 tablet (40 mg total) by mouth once daily 30 tablet 11  ? cloNIDine HCL (CATAPRES) 0.3 MG tablet Take 1 tablet (0.3 mg total) by mouth 2 (two) times daily 270 tablet 3  ? folic acid (FOLVITE) 1 MG tablet Take by mouth once daily    ? FUROsemide (LASIX) 20 MG tablet TAKE 1 TABLET BY MOUTH ON ODD DAYS AND 2 TABLETS ON EVEN DAYS AS DIRECTED FOR EDEMA 180 tablet 3  ? hydrALAZINE (APRESOLINE) 100 MG tablet Take 1 tablet (100 mg total) by mouth 2 (two) times daily 180 tablet 3  ? pantoprazole (PROTONIX) 20  MG DR tablet Take 1 tablet (20 mg total) by mouth once daily 30 tablet 11  ? rosuvastatin (CRESTOR) 5 MG tablet Take 1 tablet (5 mg total) by mouth once daily 30 tablet 11  ? sodium bicarbonate 650 MG tablet Take 1 tablet (650 mg total) by mouth 2 (two) times daily 180 tablet 1  ? warfarin (COUMADIN) 4 MG tablet TAKE 1 TABLET BY MOUTH EVERY DAY 90 tablet 3  ? ?No current facility-administered medications for this visit.  ? ? ?ALLERGIES: ?Lipitor  [atorvastatin] ? ?PAST MEDICAL HISTORY: ?Past Medical History:  ?Diagnosis Date  ? Abnormal heart rhythm   ? Anemia of chronic kidney failure, stage 4 (severe) (CMS-HCC) 03/05/2018  ? Aneurysm of aorta (CMS-HCC) 2004  ? Aneurysm of aorta (CMS-HCC) 1/1

## 2022-03-02 ENCOUNTER — Other Ambulatory Visit: Payer: Self-pay | Admitting: General Surgery

## 2022-03-02 DIAGNOSIS — C50212 Malignant neoplasm of upper-inner quadrant of left female breast: Secondary | ICD-10-CM

## 2022-03-03 ENCOUNTER — Encounter: Payer: Self-pay | Admitting: Oncology

## 2022-03-03 NOTE — Progress Notes (Signed)
Navigation initiated . Consults scheduled with Dr. Tasia Catchings, and Dr. Peyton Najjar, Patient is already known to both providers. ?

## 2022-03-08 ENCOUNTER — Encounter: Payer: Self-pay | Admitting: General Surgery

## 2022-03-08 ENCOUNTER — Other Ambulatory Visit: Payer: Self-pay

## 2022-03-08 ENCOUNTER — Other Ambulatory Visit
Admission: RE | Admit: 2022-03-08 | Discharge: 2022-03-08 | Disposition: A | Discharge status: Home / Self Care | Payer: Medicare Other | Source: Ambulatory Visit | Attending: General Surgery | Admitting: General Surgery

## 2022-03-08 DIAGNOSIS — Z7901 Long term (current) use of anticoagulants: Secondary | ICD-10-CM

## 2022-03-08 DIAGNOSIS — D649 Anemia, unspecified: Secondary | ICD-10-CM

## 2022-03-08 DIAGNOSIS — N184 Chronic kidney disease, stage 4 (severe): Secondary | ICD-10-CM

## 2022-03-08 HISTORY — DX: Unspecified osteoarthritis, unspecified site: M19.90

## 2022-03-08 HISTORY — DX: Sleep apnea, unspecified: G47.30

## 2022-03-08 NOTE — Progress Notes (Signed)
?Perioperative Services ? ?Pre-Admission/Anesthesia Testing Clinical Review ? ?Date: 03/10/22 ? ?Patient Demographics:  ?Name: Jeanette Romero ?DOB:   1951/03/04 ?MRN:   [id number] ? ?Planned Surgical Procedure(s):  ? ? Case: [id number] Date/Time: 03/14/22 0915  ? Procedure: PARTIAL MASTECTOMY WITH Radio Frequency tag AND AXILLARY SENTINEL LYMPH NODE BX (Left: Breast)  ? Anesthesia type: General  ? 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 Location: [facility] OR ROOM 04 / [facility] ORS FOR ANESTHESIA GROUP  ? Surgeons: Herbert Pun, MD  ? ?NOTE: Available PAT nursing documentation and vital signs have been reviewed. Clinical nursing staff has updated patient's PMH/PSHx, current medication list, and drug allergies/intolerances to ensure comprehensive history available to assist in medical decision making as it pertains to the aforementioned surgical procedure and anticipated anesthetic course.  ? ?Clinical Discussion:  ?Jeanette Romero is a 71 y.o. female who is submitted for pre-surgical anesthesia review and clearance prior to her undergoing the above procedure. Patient is a Former Smoker (10 pack years; quit 12/2002). Pertinent PMH includes: CAD, permanent atrial fibrillation, HFpEF, CVA, severe aortic regurgitation (s/p AVR), aortic atherosclerosis, ascending aortic aneurism (s/p dissection and repair), aortic pseudoaneurysm, HTN, HLD, SOB, OSAH (does not require nocturnal PAP therapy), GERD (on daily PPI), CKD-IV, LEFT breast cancer, anemia of chronic disease, anxiety (on BZO). ? ?Patient is followed by cardiology Edwin Dada, MD). She was last seen in the cardiology clinic on 01/03/2022; notes reviewed.  At the time of her clinic visit, patient doing well overall from a cardiovascular standpoint.  She denied any episodes of chest pain, however had chronic exertional dyspnea.  Patient with weakness and anergia.  She denied any  PND, orthopnea, palpitations, significant peripheral edema, vertiginous symptoms, or presyncope/syncope.  Patient with a significant cardiovascular history. 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  During the procedure, a 28 mm Hemashield light graft was placed and aortic valve was resuspended. ? ?Patient with known severe aortic valve regurgitation.  She underwent TAVR on 09/19/2003 placing a Saint Jude mechanical valve. ? ?Diagnostic left heart catheterization was performed on 05/07/2015 revealing multivessel CAD; 50% RPDA, 50% inferior septal, 50% mid LCx, 50% proximal LAD, and 40% mid RCA.  Intervention was deferred opting for medical management. ? 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  Patient reported associated hemianopsia.  She has no lasting residual deficits per her report. ? ?Patient with significant peripheral vascular disease.  She is status post remote axillofemoral and femorofemoral bypasses. ? ?Last TTE was performed on 12/14/2018 revealing a normal left ventricular systolic function with severe LVH; LVEF >55%.  Diastolic Doppler parameters consistent with a reversible restrictive pattern (G3DD).  Left atrium severely enlarged and right atrium moderately enlarged.  There was trivial aortic and mild mitral/tricuspid/pulmonary valve regurgitation.  There was severe prostatic aortic valve stenosis noted; VMAX 4.7 m/s; mean pressure gradient 42 mmHg.  Aortic root mildly dilated at 4.6 cm. ? 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 ?Patient with an atrial fibrillation diagnosis; CHA2DS2-VASc Score = 7 (age, sex, HFpEF, HTN, CVA x 2, PVD).  Patient status post DCCV procedure in 2016 and TEE with cardioversion in 09/2018.  Rate and rhythm currently maintained without morphological  intervention.  Patient is chronically anticoagulated using daily warfarin; compliant with therapy with no evidence or reports of  GI bleeding.  Blood pressure well controlled 114/68 on currently prescribed CCB, ACEi, alpha-blocker, diuretic, and vasodilator therapies.  Patient is on a statin for her HLD and further ASCVD prevention.  She is not diabetic.  Patient with an OSAH diagnosis, however she does not require use of nocturnal PAP therapy.  Functional capacity limited by underlying cardiopulmonary diagnoses, arthritides, and overall deconditioning, however patient still felt to be able to achieve at least 4 METS of activity without angina/anginal equivalent symptoms.  No changes were made to her medication regimen.  Patient follow-up with outpatient cardiology in 6 months or sooner if needed. ? 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  Patient has been scheduled to undergo a PARTIAL MASTECTOMY WITH RADIO FREQUENCY TAG AND AXILLARY SENTINEL LYMPH NODE BIOPSY on 03/14/2022 with Dr. Herbert Pun.  Given patient's past medical history significant for cardiovascular diagnoses, presurgical cardiac clearance was sought by the PAT team.  Per cardiology, "this patient is optimized for surgery and may proceed with the planned procedural course with a LOW risk of significant perioperative cardiovascular complications". Again, this patient is on daily anticoagulation therapy.  Patient has been instructed on recommendations from her primary surgeon and cardiology for holding her warfarin and daily low-dose ASA for 5 days prior to her procedure. Cardiology asking that both the ASA and warfarin be restarted on the night of her procedure. The patient is aware that her last dose of these medications will be on 03/08/2022.  Per cardiology, patient will not require enoxaparin bridging  prior to this procedure. ? ?Patient denies previous perioperative complications with anesthesia in the past. In review of the available records, it is noted that patient underwent a general anesthetic course here (ASA III) in 02/2021 without documented complications.  ? ? ?  02/28/2022  ?  9:26 AM 02/03/2022  ?  9:28 AM 12/07/2021  ?  9:00 AM  ?Vitals with BMI  ?Weight 167 lbs 167 lbs   ?Systolic 161 096 045  ?Diastolic 81 83 71  ?Pulse 70 78 73  ? ? ?Providers/Specialists:  ? ?NOTE: Primary physician provider listed below. Patient may have been seen by APP or partner within same practice.  ? ?PROVIDER ROLE / SPECIALTY LAST OV  ?Herbert Pun, MD General Surgery 03/01/2022  ?[place] Pink, MD Primary Care Provider 02/10/2022  ?Cloretta Ned, MD Cardiology 01/03/2022  ?Earlie Server, MD Oncology 02/28/2022  ? ?Allergies:  ?Atorvastatin ? ?Current Home Medications:  ? ?No current facility-administered medications for this encounter.  ? ? ALPRAZolam (XANAX) 0.25 MG tablet  ? amLODipine (NORVASC) 10 MG tablet  ? amoxicillin (AMOXIL) 500 MG tablet  ? aspirin EC 81 MG tablet  ? benazepril (LOTENSIN) 40 MG tablet  ? cloNIDine (CATAPRES) 0.3 MG tablet  ? folic acid (FOLVITE) 1 MG tablet  ? furosemide (LASIX) 20 MG tablet  ? hydrALAZINE (APRESOLINE) 100 MG tablet  ? pantoprazole (PROTONIX) 20 MG tablet  ? rosuvastatin (CRESTOR) 5 MG tablet  ? sodium bicarbonate 650 MG tablet  ? warfarin (COUMADIN) 4 MG tablet  ? warfarin (COUMADIN) 6 MG tablet  ? cyclobenzaprine (FLEXERIL) 5 MG tablet  ? diphenhydramine-acetaminophen (TYLENOL PM) 25-500 MG TABS tablet  ? oxyCODONE-acetaminophen (PERCOCET) 5-325 MG tablet  ? traMADol (ULTRAM) 50 MG tablet  ? ?History:  ? ?Past Medical History:  ?Diagnosis Date  ? (HFpEF) heart failure with preserved ejection fraction ([place])   ?  Anemia in chronic kidney disease (CKD) 11/14/2017  ? Aneurysm of infrarenal abdominal aorta   ? a.) measured 2.3 x 2.8 cm on 11/15/2019  ? 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 a.) measured 1.9 x 1.9 on 11/15/2019  ? Anxiety   ? Aortic atherosclerosis ([place])   ? Aortic valve regurgitation   ? a.) severe; s/p placement of St. Jude mechanical valve 09/19/2003  ? [place]

## 2022-03-08 NOTE — Patient Instructions (Addendum)
Your procedure is scheduled on: 03/14/22 - Monday ?Report to the Registration Desk on the 1st floor of the [facility]. ?To find out your arrival time, please call [phone] between 1PM - 3PM on: 03/11/22 - Friday  ? ?REMEMBER: ?Instructions that are not followed completely may result in serious medical risk, up to and including death; or upon the discretion of your surgeon and anesthesiologist your surgery may need to be rescheduled. ? ?Do not eat food or drink any fluids after midnight the night before surgery.  ?No gum chewing, lozengers or hard candies. ? ?TAKE ONLY THESE MEDICATIONS THE MORNING OF SURGERY WITH A SIP OF WATER: ? ?- ALPRAZolam (XANAX) 0.25 MG tablet ?- amLODipine (NORVASC) 10 MG tablet ?- cloNIDine (CATAPRES) 0.3 MG tablet ?- hydrALAZINE (APRESOLINE) 100 MG tablet ?- sodium bicarbonate 650 MG tablet ?- pantoprazole (PROTONIX) 20 MG tablet, (take one the night before and one on the morning of surgery - helps to prevent nausea after surgery.) ? ?Follow recommendations from Cardiologist, Pulmonologist or PCP regarding stopping Aspirin, Coumadin, Plavix, Eliquis, Pradaxa, or Pletal- Hold coumadin 5 days before procedure, resume after procedure on the same day, hold Aspirin 5 days prior to surgery. ? ?One week prior to surgery: ?Stop Anti-inflammatories (NSAIDS) such as Advil, Aleve, Ibuprofen, Motrin, Naproxen, Naprosyn and Aspirin based products such as Excedrin, Goodys Powder, BC Powder. ? ?Stop ANY OVER THE COUNTER supplements until after surgery. ? ?You may however, continue to take Tylenol if needed for pain up until the day of surgery. ? ?No Alcohol for 24 hours before or after surgery. ? ?No Smoking including e-cigarettes for 24 hours prior to surgery.  ?No chewable tobacco products for at least 6 hours prior to surgery.  ?No nicotine patches on the day of surgery. ? ?Do not use any "recreational" drugs for at least a week prior to your surgery.  ?Please be advised that the combination  of cocaine and anesthesia may have negative outcomes, up to and including death. ?If you test positive for cocaine, your surgery will be cancelled. ? ?On the morning of surgery brush your teeth with toothpaste and water, you may rinse your mouth with mouthwash if you wish. ?Do not swallow any toothpaste or mouthwash. ? ?Use CHG Soap or wipes as directed on instruction sheet. ? ?Do not wear jewelry, make-up, hairpins, clips or nail polish. ? ?Do not wear lotions, powders, or perfumes.  ? ?Do not shave body from the neck down 48 hours prior to surgery just in case you cut yourself which could leave a site for infection.  ?Also, freshly shaved skin may become irritated if using the CHG soap. ? ?Contact lenses, hearing aids and dentures may not be worn into surgery. ? ?Do not bring valuables to the hospital. [facility] is not responsible for any missing/lost belongings or valuables.  ? ?Notify your doctor if there is any change in your medical condition (cold, fever, infection). ? ?Wear comfortable clothing (specific to your surgery type) to the hospital. ? ?After surgery, you can help prevent lung complications by doing breathing exercises.  ?Take deep breaths and cough every 1-2 hours. Your doctor may order a device called an Incentive Spirometer to help you take deep breaths. ?When coughing or sneezing, hold a pillow firmly against your incision with both hands. This is called ?splinting.? Doing this helps protect your incision. It also decreases belly discomfort. ? ?If you are being admitted to the hospital overnight, leave your suitcase in the car. ?After surgery it  may be brought to your room. ? ?If you are being discharged the day of surgery, you will not be allowed to drive home. ?You will need a responsible adult (18 years or older) to drive you home and stay with you that night.  ? ?If you are taking public transportation, you will need to have a responsible adult (18 years or older) with you. ?Please confirm  with your physician that it is acceptable to use public transportation.  ? ?Please call the [place] Dept. at [phone] if you have any questions about these instructions. ? ?Surgery Visitation Policy: ? ?Patients undergoing a surgery or procedure may have two family members or support persons with them as long as the person is not COVID-19 positive or experiencing its symptoms.  ? ?Inpatient Visitation:   ? ?Visiting hours are 7 a.m. to 8 p.m. ?Up to four visitors are allowed at one time in a patient room, including children. The visitors may rotate out with other people during the day. One designated support person (adult) may remain overnight.  ?

## 2022-03-09 ENCOUNTER — Other Ambulatory Visit: Payer: Self-pay | Admitting: General Surgery

## 2022-03-09 DIAGNOSIS — Z170 Estrogen receptor positive status [ER+]: Secondary | ICD-10-CM

## 2022-03-10 ENCOUNTER — Other Ambulatory Visit
Admission: RE | Admit: 2022-03-10 | Discharge: 2022-03-10 | Disposition: A | Discharge status: Home / Self Care | Payer: Medicare Other | Source: Ambulatory Visit | Attending: General Surgery | Admitting: General Surgery

## 2022-03-10 ENCOUNTER — Encounter: Payer: Self-pay | Admitting: General Surgery

## 2022-03-10 ENCOUNTER — Ambulatory Visit
Admission: RE | Admit: 2022-03-10 | Discharge: 2022-03-10 | Disposition: A | Discharge status: Home / Self Care | Payer: Medicare Other | Source: Ambulatory Visit | Attending: General Surgery | Admitting: General Surgery

## 2022-03-10 DIAGNOSIS — C50212 Malignant neoplasm of upper-inner quadrant of left female breast: Secondary | ICD-10-CM

## 2022-03-10 DIAGNOSIS — Z7901 Long term (current) use of anticoagulants: Secondary | ICD-10-CM

## 2022-03-10 DIAGNOSIS — D631 Anemia in chronic kidney disease: Secondary | ICD-10-CM

## 2022-03-10 DIAGNOSIS — N184 Chronic kidney disease, stage 4 (severe): Secondary | ICD-10-CM | POA: Diagnosis present

## 2022-03-10 DIAGNOSIS — D649 Anemia, unspecified: Secondary | ICD-10-CM

## 2022-03-10 DIAGNOSIS — Z170 Estrogen receptor positive status [ER+]: Secondary | ICD-10-CM

## 2022-03-10 LAB — CBC
HCT: 28.6 % — ABNORMAL LOW (ref 36.0–46.0)
Hemoglobin: 8.4 g/dL — ABNORMAL LOW (ref 12.0–15.0)
MCH: 27.2 pg (ref 26.0–34.0)
MCHC: 29.4 g/dL — ABNORMAL LOW (ref 30.0–36.0)
MCV: 92.6 fL (ref 80.0–100.0)
Platelets: 178 10*3/uL (ref 150–400)
RBC: 3.09 MIL/uL — ABNORMAL LOW (ref 3.87–5.11)
RDW: 14 % (ref 11.5–15.5)
WBC: 3.7 10*3/uL — ABNORMAL LOW (ref 4.0–10.5)
nRBC: 0 % (ref 0.0–0.2)

## 2022-03-13 MED ORDER — CEFAZOLIN SODIUM-DEXTROSE 2-4 GM/100ML-% IV SOLN
2.0000 g | INTRAVENOUS | Status: AC
  Administered 2022-03-14: 2 g via INTRAVENOUS

## 2022-03-13 MED ORDER — ORAL CARE MOUTH RINSE
15.0000 mL | Freq: Once | OROMUCOSAL | Status: AC
Start: 2022-03-13 — End: 2022-03-14

## 2022-03-13 MED ORDER — LACTATED RINGERS IV SOLN: INTRAVENOUS | Status: DC

## 2022-03-13 MED ORDER — CHLORHEXIDINE GLUCONATE 0.12 % MT SOLN: 15.0000 mL | Freq: Once | OROMUCOSAL | Status: AC

## 2022-03-14 ENCOUNTER — Other Ambulatory Visit: Payer: Self-pay

## 2022-03-14 ENCOUNTER — Encounter: Payer: Self-pay | Admitting: General Surgery

## 2022-03-14 ENCOUNTER — Encounter
Admission: RE | Disposition: A | Discharge status: Home / Self Care | Payer: Self-pay | Source: Home / Self Care | Attending: General Surgery

## 2022-03-14 ENCOUNTER — Ambulatory Visit
Admission: RE | Admit: 2022-03-14 | Discharge: 2022-03-14 | Disposition: A | Discharge status: Home / Self Care | Payer: Medicare Other | Attending: General Surgery | Admitting: General Surgery

## 2022-03-14 ENCOUNTER — Ambulatory Visit: Payer: Medicare Other | Admitting: Urgent Care

## 2022-03-14 ENCOUNTER — Ambulatory Visit
Admission: RE | Admit: 2022-03-14 | Discharge: 2022-03-14 | Disposition: A | Discharge status: Home / Self Care | Payer: Medicare Other | Source: Ambulatory Visit | Attending: General Surgery | Admitting: General Surgery

## 2022-03-14 ENCOUNTER — Other Ambulatory Visit: Payer: Medicare Other

## 2022-03-14 DIAGNOSIS — I700 Atherosclerosis of aorta: Secondary | ICD-10-CM | POA: Diagnosis not present

## 2022-03-14 DIAGNOSIS — C50212 Malignant neoplasm of upper-inner quadrant of left female breast: Secondary | ICD-10-CM | POA: Insufficient documentation

## 2022-03-14 DIAGNOSIS — Z7901 Long term (current) use of anticoagulants: Secondary | ICD-10-CM | POA: Diagnosis not present

## 2022-03-14 DIAGNOSIS — Z170 Estrogen receptor positive status [ER+]: Secondary | ICD-10-CM | POA: Diagnosis not present

## 2022-03-14 DIAGNOSIS — I129 Hypertensive chronic kidney disease with stage 1 through stage 4 chronic kidney disease, or unspecified chronic kidney disease: Secondary | ICD-10-CM | POA: Insufficient documentation

## 2022-03-14 DIAGNOSIS — J449 Chronic obstructive pulmonary disease, unspecified: Secondary | ICD-10-CM | POA: Diagnosis not present

## 2022-03-14 DIAGNOSIS — N184 Chronic kidney disease, stage 4 (severe): Secondary | ICD-10-CM | POA: Diagnosis not present

## 2022-03-14 DIAGNOSIS — Z87891 Personal history of nicotine dependence: Secondary | ICD-10-CM | POA: Diagnosis not present

## 2022-03-14 DIAGNOSIS — Z79899 Other long term (current) drug therapy: Secondary | ICD-10-CM | POA: Diagnosis not present

## 2022-03-14 DIAGNOSIS — I4821 Permanent atrial fibrillation: Secondary | ICD-10-CM | POA: Insufficient documentation

## 2022-03-14 DIAGNOSIS — I2510 Atherosclerotic heart disease of native coronary artery without angina pectoris: Secondary | ICD-10-CM | POA: Insufficient documentation

## 2022-03-14 HISTORY — DX: Cerebral infarction due to embolism of other precerebral artery: I63.19

## 2022-03-14 HISTORY — DX: Personal history of other diseases of the digestive system: Z87.19

## 2022-03-14 HISTORY — DX: Unspecified diastolic (congestive) heart failure: I50.30

## 2022-03-14 HISTORY — PX: PARTIAL MASTECTOMY WITH NEEDLE LOCALIZATION AND AXILLARY SENTINEL LYMPH NODE BX: SHX6009

## 2022-03-14 LAB — POCT I-STAT, CHEM 8
BUN: 57 mg/dL — ABNORMAL HIGH (ref 8–23)
Calcium, Ion: 1.32 mmol/L (ref 1.15–1.40)
Chloride: 107 mmol/L (ref 98–111)
Creatinine, Ser: 3 mg/dL — ABNORMAL HIGH (ref 0.44–1.00)
Glucose, Bld: 99 mg/dL (ref 70–99)
HCT: 24 % — ABNORMAL LOW (ref 36.0–46.0)
Hemoglobin: 8.2 g/dL — ABNORMAL LOW (ref 12.0–15.0)
Potassium: 4.2 mmol/L (ref 3.5–5.1)
Sodium: 141 mmol/L (ref 135–145)
TCO2: 24 mmol/L (ref 22–32)

## 2022-03-14 LAB — PROTIME-INR
INR: 1.1 (ref 0.8–1.2)
Prothrombin Time: 14 seconds (ref 11.4–15.2)

## 2022-03-14 SURGERY — PARTIAL MASTECTOMY WITH NEEDLE LOCALIZATION AND AXILLARY SENTINEL LYMPH NODE BX
Anesthesia: General | Site: Breast | Laterality: Left

## 2022-03-14 MED ORDER — LIDOCAINE HCL (CARDIAC) PF 100 MG/5ML IV SOSY
PREFILLED_SYRINGE | INTRAVENOUS | Status: DC | PRN
  Administered 2022-03-14: 60 mg via INTRAVENOUS

## 2022-03-14 MED ORDER — DEXAMETHASONE SODIUM PHOSPHATE 10 MG/ML IJ SOLN
INTRAMUSCULAR | Status: DC | PRN
  Administered 2022-03-14: 5 mg via INTRAVENOUS

## 2022-03-14 MED ORDER — FENTANYL CITRATE (PF) 100 MCG/2ML IJ SOLN
INTRAMUSCULAR | Status: AC
  Administered 2022-03-14: 25 ug
  Filled 2022-03-14: qty 2

## 2022-03-14 MED ORDER — CEFAZOLIN SODIUM-DEXTROSE 2-4 GM/100ML-% IV SOLN
INTRAVENOUS | Status: AC
  Filled 2022-03-14: qty 100

## 2022-03-14 MED ORDER — PROPOFOL 10 MG/ML IV BOLUS
INTRAVENOUS | Status: DC | PRN
Start: 2022-03-14 — End: 2022-03-14
  Administered 2022-03-14: 20 mg via INTRAVENOUS
  Administered 2022-03-14: 60 mg via INTRAVENOUS
  Administered 2022-03-14: 30 mg via INTRAVENOUS

## 2022-03-14 MED ORDER — ACETAMINOPHEN 500 MG PO TABS: 1000.0000 mg | ORAL_TABLET | Freq: Once | ORAL | Status: AC

## 2022-03-14 MED ORDER — ONDANSETRON HCL 4 MG/2ML IJ SOLN
INTRAMUSCULAR | Status: DC | PRN
  Administered 2022-03-14: 4 mg via INTRAVENOUS

## 2022-03-14 MED ORDER — OXYCODONE HCL 5 MG/5ML PO SOLN: 5.0000 mg | Freq: Once | ORAL | Status: DC | PRN

## 2022-03-14 MED ORDER — BUPIVACAINE-EPINEPHRINE (PF) 0.5% -1:200000 IJ SOLN
INTRAMUSCULAR | Status: AC
  Filled 2022-03-14: qty 30

## 2022-03-14 MED ORDER — PROPOFOL 10 MG/ML IV BOLUS
INTRAVENOUS | Status: AC
  Filled 2022-03-14: qty 20

## 2022-03-14 MED ORDER — DEXAMETHASONE SODIUM PHOSPHATE 10 MG/ML IJ SOLN
INTRAMUSCULAR | Status: AC
  Filled 2022-03-14: qty 5

## 2022-03-14 MED ORDER — EPHEDRINE 5 MG/ML INJ
INTRAVENOUS | Status: AC
  Filled 2022-03-14: qty 10

## 2022-03-14 MED ORDER — ROCURONIUM BROMIDE 10 MG/ML (PF) SYRINGE
PREFILLED_SYRINGE | INTRAVENOUS | Status: AC
  Filled 2022-03-14: qty 30

## 2022-03-14 MED ORDER — ACETAMINOPHEN 10 MG/ML IV SOLN: 1000.0000 mg | Freq: Once | INTRAVENOUS | Status: DC | PRN

## 2022-03-14 MED ORDER — HEMOSTATIC AGENTS (NO CHARGE) OPTIME
TOPICAL | Status: DC | PRN
  Administered 2022-03-14: 1 via TOPICAL

## 2022-03-14 MED ORDER — ROCURONIUM BROMIDE 10 MG/ML (PF) SYRINGE
PREFILLED_SYRINGE | INTRAVENOUS | Status: AC
  Filled 2022-03-14: qty 10

## 2022-03-14 MED ORDER — PROMETHAZINE HCL 25 MG/ML IJ SOLN: 6.2500 mg | INTRAMUSCULAR | Status: DC | PRN

## 2022-03-14 MED ORDER — CHLORHEXIDINE GLUCONATE 0.12 % MT SOLN
OROMUCOSAL | Status: AC
Start: 2022-03-14 — End: 2022-03-14
  Administered 2022-03-14: 15 mL via OROMUCOSAL
  Filled 2022-03-14: qty 15

## 2022-03-14 MED ORDER — ONDANSETRON HCL 4 MG/2ML IJ SOLN
INTRAMUSCULAR | Status: AC
  Filled 2022-03-14: qty 10

## 2022-03-14 MED ORDER — FENTANYL CITRATE (PF) 100 MCG/2ML IJ SOLN
25.0000 ug | INTRAMUSCULAR | Status: DC | PRN
  Administered 2022-03-14 (×3): 25 ug via INTRAVENOUS

## 2022-03-14 MED ORDER — FENTANYL CITRATE (PF) 100 MCG/2ML IJ SOLN
INTRAMUSCULAR | Status: AC
  Filled 2022-03-14: qty 2

## 2022-03-14 MED ORDER — HYDROCODONE-ACETAMINOPHEN 5-325 MG PO TABS: 1.0000 | ORAL_TABLET | ORAL | 0 refills | Status: AC | PRN

## 2022-03-14 MED ORDER — METHYLENE BLUE 0.5 % INJ SOLN
INTRAVENOUS | Status: AC
  Filled 2022-03-14: qty 10

## 2022-03-14 MED ORDER — STERILE WATER FOR IRRIGATION IR SOLN
Status: DC | PRN
  Administered 2022-03-14: 500 mL

## 2022-03-14 MED ORDER — PHENYLEPHRINE 40 MCG/ML (10ML) SYRINGE FOR IV PUSH (FOR BLOOD PRESSURE SUPPORT)
PREFILLED_SYRINGE | INTRAVENOUS | Status: AC
  Filled 2022-03-14: qty 10

## 2022-03-14 MED ORDER — ACETAMINOPHEN 500 MG PO TABS
ORAL_TABLET | ORAL | Status: AC
  Administered 2022-03-14: 1000 mg via ORAL
  Filled 2022-03-14: qty 2

## 2022-03-14 MED ORDER — OXYCODONE HCL 5 MG PO TABS: 5.0000 mg | ORAL_TABLET | Freq: Once | ORAL | Status: DC | PRN

## 2022-03-14 MED ORDER — PHENYLEPHRINE HCL-NACL 20-0.9 MG/250ML-% IV SOLN
INTRAVENOUS | Status: DC | PRN
  Administered 2022-03-14: 50 ug/min via INTRAVENOUS

## 2022-03-14 MED ORDER — FENTANYL CITRATE (PF) 100 MCG/2ML IJ SOLN
INTRAMUSCULAR | Status: DC | PRN
  Administered 2022-03-14 (×6): 25 ug via INTRAVENOUS
  Administered 2022-03-14: 50 ug via INTRAVENOUS

## 2022-03-14 MED ORDER — BUPIVACAINE-EPINEPHRINE (PF) 0.5% -1:200000 IJ SOLN
INTRAMUSCULAR | Status: DC | PRN
  Administered 2022-03-14: 16 mL
  Administered 2022-03-14: 14 mL

## 2022-03-14 MED ORDER — GLYCOPYRROLATE 0.2 MG/ML IJ SOLN
INTRAMUSCULAR | Status: AC
  Filled 2022-03-14: qty 3

## 2022-03-14 MED ORDER — TECHNETIUM TC 99M TILMANOCEPT KIT
1.0300 | PACK | Freq: Once | INTRAVENOUS | Status: AC | PRN
  Administered 2022-03-14: 1.03 via INTRADERMAL

## 2022-03-14 MED ORDER — LIDOCAINE HCL (PF) 2 % IJ SOLN
INTRAMUSCULAR | Status: AC
  Filled 2022-03-14: qty 15

## 2022-03-14 SURGICAL SUPPLY — 46 items
ADH SKN CLS APL DERMABOND .7 (GAUZE/BANDAGES/DRESSINGS) ×1
APL PRP STRL LF DISP 70% ISPRP (MISCELLANEOUS) ×1
BINDER BREAST XLRG (GAUZE/BANDAGES/DRESSINGS) ×1 IMPLANT
BLADE SURG 15 STRL LF DISP TIS (BLADE) ×2 IMPLANT
BLADE SURG 15 STRL SS (BLADE) ×4
CHLORAPREP W/TINT 26 (MISCELLANEOUS) ×2 IMPLANT
DERMABOND ADVANCED (GAUZE/BANDAGES/DRESSINGS) ×1
DERMABOND ADVANCED .7 DNX12 (GAUZE/BANDAGES/DRESSINGS) ×1 IMPLANT
DEVICE DUBIN SPECIMEN MAMMOGRA (MISCELLANEOUS) ×2 IMPLANT
DRAPE LAPAROTOMY TRNSV 106X77 (MISCELLANEOUS) ×2 IMPLANT
DRSG GAUZE FLUFF 36X18 (GAUZE/BANDAGES/DRESSINGS) ×1 IMPLANT
ELECT CAUTERY BLADE 6.4 (BLADE) ×2 IMPLANT
ELECT REM PT RETURN 9FT ADLT (ELECTROSURGICAL) ×2
ELECTRODE REM PT RTRN 9FT ADLT (ELECTROSURGICAL) ×1 IMPLANT
GAUZE 4X4 16PLY ~~LOC~~+RFID DBL (SPONGE) ×2 IMPLANT
GLOVE SURG ENC MOIS LTX SZ6.5 (GLOVE) ×2 IMPLANT
GLOVE SURG UNDER POLY LF SZ6.5 (GLOVE) ×2 IMPLANT
GOWN STRL REUS W/ TWL LRG LVL3 (GOWN DISPOSABLE) ×2 IMPLANT
GOWN STRL REUS W/TWL LRG LVL3 (GOWN DISPOSABLE) ×4
HEMOSTAT ARISTA ABSORB 1G (HEMOSTASIS) ×1 IMPLANT
KIT MARKER MARGIN INK (KITS) ×1 IMPLANT
KIT TURNOVER KIT A (KITS) ×2 IMPLANT
LABEL OR SOLS (LABEL) ×2 IMPLANT
MANIFOLD NEPTUNE II (INSTRUMENTS) ×2 IMPLANT
MARGIN MAP 10MM (MISCELLANEOUS) ×1 IMPLANT
MARKER MARGIN CORRECT CLIP (MARKER) ×1 IMPLANT
NDL HYPO 25X1 1.5 SAFETY (NEEDLE) ×1 IMPLANT
NEEDLE HYPO 22GX1.5 SAFETY (NEEDLE) ×2 IMPLANT
NEEDLE HYPO 25X1 1.5 SAFETY (NEEDLE) ×2 IMPLANT
PACK BASIN MINOR ARMC (MISCELLANEOUS) ×2 IMPLANT
RETRACTOR RING XSMALL (MISCELLANEOUS) IMPLANT
RTRCTR WOUND ALEXIS 13CM XS SH (MISCELLANEOUS) ×2
SET LOCALIZER 20 PROBE US (MISCELLANEOUS) ×2 IMPLANT
SLEVE PROBE SENORX GAMMA FIND (MISCELLANEOUS) ×2 IMPLANT
SUT MNCRL 4-0 (SUTURE) ×2
SUT MNCRL 4-0 27XMFL (SUTURE) ×1
SUT SILK 2 0 SH (SUTURE) ×2 IMPLANT
SUT VIC AB 2-0 SH 27 (SUTURE) ×2
SUT VIC AB 2-0 SH 27XBRD (SUTURE) IMPLANT
SUT VIC AB 3-0 SH 27 (SUTURE) ×4
SUT VIC AB 3-0 SH 27X BRD (SUTURE) ×2 IMPLANT
SUTURE MNCRL 4-0 27XMF (SUTURE) ×2 IMPLANT
SYR 10ML LL (SYRINGE) ×4 IMPLANT
SYR BULB IRRIG 60ML STRL (SYRINGE) ×2 IMPLANT
TRAP NEPTUNE SPECIMEN COLLECT (MISCELLANEOUS) ×2 IMPLANT
WATER STERILE IRR 500ML POUR (IV SOLUTION) ×2 IMPLANT

## 2022-03-14 NOTE — Anesthesia Postprocedure Evaluation (Signed)
Anesthesia Post Note ? ?Patient: Jeanette Romero ? ?Procedure(s) Performed: PARTIAL MASTECTOMY WITH Radio Frequency tag AND AXILLARY SENTINEL LYMPH NODE BX (Left: Breast) ? ?Patient location during evaluation: PACU ?Anesthesia Type: General ?Level of consciousness: awake and alert ?Pain management: pain level controlled ?Vital Signs Assessment: post-procedure vital signs reviewed and stable ?Respiratory status: spontaneous breathing, nonlabored ventilation and respiratory function stable ?Cardiovascular status: blood pressure returned to baseline and stable ?Postop Assessment: no apparent nausea or vomiting ?Anesthetic complications: no ? ? ?No notable events documented. ? ? ?Last Vitals:  ?Vitals:  ? 03/14/22 1315 03/14/22 1325  ?BP: (!) 116/59 124/70  ?Pulse: (!) 51 63  ?Resp: 18 14  ?Temp: 36.4 ?C 36.5 ?C  ?SpO2: 95% 92%  ?  ?Last Pain:  ?Vitals:  ? 03/14/22 1325  ?TempSrc: Oral  ?PainSc: 2   ? ? ?  ?  ?  ?  ?  ?  ? ?[place] Ouch ? ? ? ? ?

## 2022-03-14 NOTE — Anesthesia Procedure Notes (Addendum)
Procedure Name: LMA Insertion ?Date/Time: 03/14/2022 9:31 AM ?Performed by: Lily Peer, Lillion Elbert, CRNA ?Pre-anesthesia Checklist: Patient identified, Emergency Drugs available, Suction available and Patient being monitored ?Patient Re-evaluated:Patient Re-evaluated prior to induction ?Oxygen Delivery Method: Circle system utilized ?Preoxygenation: Pre-oxygenation with 100% oxygen ?Induction Type: IV induction ?Ventilation: Mask ventilation without difficulty ?LMA: LMA inserted ?LMA Size: 3.0 ?Number of attempts: 1 ?Placement Confirmation: positive ETCO2 and breath sounds checked- equal and bilateral ?Tube secured with: Tape ?Dental Injury: Teeth and Oropharynx as per pre-operative assessment  ? ? ? ? ?

## 2022-03-14 NOTE — Discharge Instructions (Addendum)
  Diet: Resume home heart healthy regular diet.   Activity: No heavy lifting >20 pounds (children, pets, laundry, garbage) or strenuous activity until follow-up, but light activity and walking are encouraged. Do not drive or drink alcohol if taking narcotic pain medications.  Wound care: May shower with soapy water and pat dry (do not rub incisions), but no baths or submerging incision underwater until follow-up. (no swimming)   Medications: Resume all home medications. For mild to moderate pain: acetaminophen (Tylenol) ***or ibuprofen (if no kidney disease). Combining Tylenol with alcohol can substantially increase your risk of causing liver disease. Narcotic pain medications, if prescribed, can be used for severe pain, though may cause nausea, constipation, and drowsiness. Do not combine Tylenol and Norco within a 6 hour period as Norco contains Tylenol. If you do not need the narcotic pain medication, you do not need to fill the prescription.  Call office ([phone]) at any time if any questions, worsening pain, fevers/chills, bleeding, drainage from incision site, or other concerns.   AMBULATORY SURGERY  DISCHARGE INSTRUCTIONS   The drugs that you were given will stay in your system until tomorrow so for the next 24 hours you should not:  Drive an automobile Make any legal decisions Drink any alcoholic beverage   You may resume regular meals tomorrow.  Today it is better to start with liquids and gradually work up to solid foods.  You may eat anything you prefer, but it is better to start with liquids, then soup and crackers, and gradually work up to solid foods.   Please notify your doctor immediately if you have any unusual bleeding, trouble breathing, redness and pain at the surgery site, drainage, fever, or pain not relieved by medication.    Additional Instructions:        Please contact your physician with any problems or Same Day Surgery at [phone], Monday  through Friday 6 am to 4 pm, or [facility] number at [phone].  

## 2022-03-14 NOTE — Op Note (Signed)
Preoperative diagnosis: Left breast carcinoma. ? ?Postoperative diagnosis: Same.  ? ?Procedure: Left radiofrequency tag-localized partial mastectomy.  ?                     Left Axillary Sentinel Lymph node biopsy ? ?Anesthesia: GETA ? ?Surgeon: Dr. Windell Moment ? ?Wound Classification: Clean ? ?Indications: Patient is a 71 y.o. female with a nonpalpable left breast mass noted on mammography with core biopsy demonstrating invasive mammary carcinoma requires radiofrequency tag-localized partial mastectomy for treatment with sentinel lymph node biopsy.  ? ?Findings: ?1. Specimen mammography shows marker and tag on specimen ?2. Pathology call refers gross examination of margins was 2 mm from anterior margin.  ?3. No other palpable mass or lymph node identified.  ? ?Description of procedure: Preoperative radiofrequency tag localization was performed by radiology. In the nuclear medicine suite, the subareolar region was injected with Tc-99 sulfur colloid. Localization studies were reviewed. The patient was taken to the operating room and placed supine on the operating table, and after general anesthesia the left chest and axilla were prepped and draped in the usual sterile fashion. A time-out was completed verifying correct patient, procedure, site, positioning, and implant(s) and/or special equipment prior to beginning this procedure.  ?By comparing the localization studies and interrogation with Localizer device, the probable trajectory and location of the mass was visualized. A circumareolar skin incision was planned in such a way as to minimize the amount of dissection to reach the mass.  ?The skin incision was made. Flaps were raised and the location of the tag was confirmed with Localizer device confirmed. A 2-0 silk figure-of-eight stay suture was placed and used for retraction. Dissection was then taken down circumferentially, taking care to include the entire localizing tag and a wide margin of grossly normal  tissue. ?The specimen and entire localizing tag were removed. The specimen was oriented and sent to radiology and pathology with the localization studies. Pathology reported close margin (2 mm) from the anterior margin. I performed a resection of the anterior margion. The wound was irrigated. Hemostasis was checked.  ?A hand-held gamma probe was used to identify the location of the hottest spot in the axilla. An incision was made around the caudal axillary hairline. Dissection was carried down until subdermal facias was advanced. The probe was placed and again, the point of maximal count was found. Dissection continue until nodule was identified. The probe was placed in contact with the node. The node was excised in its entirety.  An additional hot spot was detected and the node was excised in similar fashion. No additional hot spots were identified. No clinically abnormal nodes were palpated. The procedure was terminated. Hemostasis was achieved and the wound closed in layers with deep interrupted 3-0 Vicryl and skin was closed with subcuticular suture of Monocryl 3-0.  ? ?The breast wound was then approached for closure.  Eliminate dead space a tissue transfer technique was utilized.  The breast and pectoralis fascia was elevated off the underlying muscle and the serratus muscle circumferentially for a distance of about 6 centimeters (A = 36 sq cm).  The fascial layer was then approximated with interrupted 2-0 Vicryl sutures.  The superficial layer of the breast parenchyma was then approximated in a similar fashion.  This was done in a radial direction.  The skin flaps were then elevated circumferentially to remove a ripple noted superiorly and medially. The skin was closed with 4-0 Monocryl. Dermabond was applied. ? ?The patient tolerated the procedure  well and was taken to the postanesthesia care unit in stable condition.  ? ?Sentinel Node Biopsy Synoptic Operative Report ? ?Operation performed with curative  intent:Yes ? ?Tracer(s) used to identify sentinel nodes in the upfront surgery (non-neoadjuvant) setting (select all that apply):Radioactive Tracer ? ?Tracer(s) used to identify sentinel nodes in the neoadjuvant setting (select all that apply):N/A ? ?All nodes (colored or non-colored) present at the end of a dye-filled lymphatic channel were removed:N/A ? ?All significantly radioactive nodes were removed:Yes ? ?All palpable suspicious nodes were removed:N/A ? ?Biopsy-proven positive nodes marked with clips prior to chemotherapy were identified and removed:N/A ? ? ? ?Specimen: Left Breast mass  ?                   Sentinel Lymph nodes #1, #2 ? ?Complications: None ? ?Estimated Blood Loss: 10 mL ? ?

## 2022-03-14 NOTE — Anesthesia Procedure Notes (Signed)
Arterial Line Insertion ?Start/End4/02/2022 9:25 AM, 03/14/2022 9:30 AM ?Performed by: Iran Ouch, MD, anesthesiologist ? Patient location: Pre-op. ?Preanesthetic checklist: patient identified, IV checked, site marked, risks and benefits discussed, surgical consent, monitors and equipment checked, pre-op evaluation and anesthesia consent ?Lidocaine 1% used for infiltration ?radial was placed ?Catheter size: 20 G ?Hand hygiene performed  and maximum sterile barriers used  ? ?Attempts: 1 ?Procedure performed using ultrasound guided technique. ?Ultrasound Notes:anatomy identified, needle tip was noted to be adjacent to the nerve/plexus identified and no ultrasound evidence of intravascular and/or intraneural injection ?Following insertion, dressing applied. ?Post procedure assessment: normal and unchanged ? ? ? ?

## 2022-03-14 NOTE — Anesthesia Preprocedure Evaluation (Addendum)
Anesthesia Evaluation  ?Patient identified by MRN, date of birth, ID band ?Patient awake ? ? ? ?Reviewed: ?Allergy & Precautions, NPO status , Patient's Chart, lab work & pertinent test results ? ?History of Anesthesia Complications ?Negative for: history of anesthetic complications ? ?Airway ?Mallampati: III ? ?TM Distance: >3 FB ?Neck ROM: Full ? ? ? Dental ?no notable dental hx. ? ?  ?Pulmonary ?shortness of breath and with exertion, sleep apnea , neg COPD, former smoker,  ?  ?breath sounds clear to auscultation- rhonchi ?(-) wheezing ? ? ? ? ? Cardiovascular ?Exercise Tolerance: Poor ?hypertension, Pt. on medications ?+ CAD, + Peripheral Vascular Disease (Chronic iliofemoral malperfusion with left > right. s/p axillofemoral and femorofemoral bypasses 2005 now with chronic occlusion), +CHF (HFpEF) and + DOE  ?(-) Past MI, (-) Cardiac Stents, (-) CABG and (-) Orthopnea + dysrhythmias Atrial Fibrillation + Valvular Problems/Murmurs (s/p AVR with prosthetic severe stenosis from echo 2020) AS  ?Rhythm:Regular Rate:Normal ?- Systolic murmurs and - Diastolic murmurs ?ECHO 1/20: ?INTERPRETATION  ?ATRIAL FIBRILLATION, CONTROLLED VENT RATE  ?SEVERE CONCENTRIC LVH, NORMAL LV SIZE  ?NORMAL LV SYSTOLIC FUNCTION  ?NORMAL RIGHT HEART  ?MODERATE TRICUSPID REGURGITATION  ?MODERATE CENTRAL MITRAL REGURGITATION  ?MILDLY DILATED AORTIC ROOT, 4.6 CM  ?MECHANICAL AORTIC VALVE (PER CHART, 21MM ST JUDE), SEVERE PROSTHETIC STENOSIS (VMAX 4.7  ?M/S, MEAN GRADIENT 42 MMHG)  ?TRUE AND FALSE LUMEN SEEN IN ABDOMINAL AORTA  ? ? ?Chronic anticoagulation with coumadin for Afib ? ?LHC 05/07/2015: 50% RPDA, 50% inf septal, 50% mLCx, 50% pLAD, 40% mRCA; intervention deferred opting for medical mgmt ? ?Severe aortic valve regurgitation / s/p AVR (Chronic) 12/24/2014  ?Overview  ?? 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 Norm Parcel, including an aortic valve resuspension and supra-coronary graft replacement  of the ascending aorta with a 28 mm Dacron graft.  ?? October 2004, after developing strep endocarditis of her resuspended aortic valve and, in underwent redo-sternotomy and St. Jude aortic valve replacement with a 21 mm mechanical valve. 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 ?Aortic pseudoaneurysm ?06/28/2018 CTA Abd/Pelvis ([facility])-films uploaded to [facility]: Chronic appearing aortic dissection noted. Intimal flap is calcified. Maximum diameter of the visualized descending thoracic aorta 3.4 cm. Chronic appearing aortic dissection with calcified intimal flap. Maximum aortic diameter at the aortic hiatus measures 3.7 cm. Diffuse aortic and iliac calcifications. Fem-fem crossover graft and right axillofemoral bypass graft noted. Low-density structure noted around the distal aspect of the axillofemoral bypass graft measures 4.3 x 3.2 cm. This measures water density, presumably postoperative fluid collection/seroma ? 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  ?Neuro/Psych ?neg Seizures PSYCHIATRIC DISORDERS Anxiety Depression  Neuromuscular disease (intermittent numbness in left foot)   ? GI/Hepatic ?Neg liver ROS, GERD  Controlled and Medicated,  ?Endo/Other  ?negative endocrine ROSneg diabetes ? Renal/GU ?CRFRenal disease  ? ?  ?Musculoskeletal ?negative musculoskeletal ROS ?(+)  ? Abdominal ?(+) + obese,   ?Peds ? Hematology ? ?(+) Blood dyscrasia, anemia , Hgb 8.4   ?Anesthesia Other Findings ?Past Medical History: ?Left breast cancer ?11/14/2017: Anemia in chronic kidney disease (CKD) ?No date: Aneurysm of infrarenal abdominal aorta (HCC) ?    Comment:  a.) measured 2.3 x 2.8 cm on 11/15/2019 ?No date: Aneurysm of left common iliac artery ([place]) ?    Comment:  a.) measured 1.9 x 1.9 on 11/15/2019 ?No date: Anxiety ?No date: Aortic atherosclerosis ([place]) ?No date: Aortic valve regurgitation ?  Comment:  severe; s/p AVR ?No date: Aortic  valve replaced ?No date: CKD (chronic kidney disease) stage 4, GFR 15-29 ml/min (HCC) ?No date: Coronary artery disease ?No date: Current use of long term anticoagulation ?    Comment:  warfarin; managed at [facility] ?No date: Dizziness ?No date: Edema ?No date: GERD (gastroesophageal reflux disease) ?No date: Gout ?No date: Hypercholesterolemia ?No date: Hypertension ?01/29/2021: Intraductal papilloma of breast, right ?No date: Permanent atrial fibrillation ([place]) ?No date: Pseudoaneurysm of aorta (HCC) ?    Comment:  a.) proximal; enhancing portion measured 4.6 x 3.8 x 3.2 ?             cm on 11/15/2019 ?No date: PVD (peripheral vascular disease) ([place]) ?    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 ? Reproductive/Obstetrics ? ?  ? ? ? ? ? ? ? ? ? ? ? ? ? ?  ?  ? ? ? ? ? ? ?Anesthesia Physical ? ?Anesthesia Plan ? ?ASA: III ? ?Anesthesia Plan: General  ? ?Post-op Pain Management: Tylenol PO (pre-op)* and Regional block*  ? ?Induction: Intravenous ? ?PONV Risk Score and Plan: 2 and Ondansetron and Dexamethasone ? ?Airway Management Planned: LMA ? ?Additional Equipment: Arterial line ? ?Intra-op Plan:  ? ?Post-operative Plan: Extubation in OR ? ?Informed Consent: I have reviewed the patients History and Physical, chart, labs and discussed the procedure including the risks, benefits and alternatives for the proposed anesthesia with the patient or authorized representative who has indicated his/her understanding and acceptance.  ? ? ? ?Dental advisory given ? ?Plan Discussed with: CRNA and Anesthesiologist ? ?Anesthesia Plan Comments: (Check INR in the morning of the surgery before nuclear medicine injections. Per PAT, Cardiology asking that both the ASA and warfarin be restarted on the night of her procedure. The patient is aware that her last dose of these medications will be on 03/08/2022.  Per cardiology, patient will not require enoxaparin  bridging prior to this procedure.)  ? ? ? ? ?Anesthesia Quick Evaluation ? ?

## 2022-03-14 NOTE — Interval H&P Note (Signed)
History and Physical Interval Note: ? ?03/14/2022 ?9:13 AM ? 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  The various methods of treatment have been discussed with the patient and family. After consideration of risks, benefits and other options for treatment, the patient has consented to  Procedure(s): ?PARTIAL MASTECTOMY WITH Radio Frequency tag AND AXILLARY SENTINEL LYMPH NODE BX (Left) as a surgical intervention.  The patient's history has been reviewed, patient examined, no change in status, stable for surgery.  I have reviewed the patient's chart and labs.  Questions were answered to the patient's satisfaction.   ? ? ?Paddy Neis Cintron-Diaz ? ? ?

## 2022-03-14 NOTE — Transfer of Care (Signed)
Immediate Anesthesia Transfer of Care Note ? ?Patient: Jeanette Romero ? ?Procedure(s) Performed: PARTIAL MASTECTOMY WITH Radio Frequency tag AND AXILLARY SENTINEL LYMPH NODE BX (Left: Breast) ? ?Patient Location: PACU ? ?Anesthesia Type:General ? ?Level of Consciousness: awake, alert  and oriented ? ?Airway & Oxygen Therapy: Patient Spontanous Breathing and Patient connected to face mask oxygen ? ?Post-op Assessment: Report given to RN and Post -op Vital signs reviewed and stable ? ?Post vital signs: Reviewed and stable ? ?Last Vitals:  ?Vitals Value Taken Time  ?BP 115/54 03/14/22 1115  ?Temp 36 ?C 03/14/22 1115  ?Pulse 53 03/14/22 1117  ?Resp 24 03/14/22 1117  ?SpO2 97 % 03/14/22 1117  ?Vitals shown include unvalidated device data. ? ?Last Pain:  ?Vitals:  ? 03/14/22 0754  ?TempSrc: Temporal  ?PainSc: 0-No pain  ?   ? ?  ? ?Complications: No notable events documented. ?

## 2022-03-15 ENCOUNTER — Encounter: Payer: Self-pay | Admitting: General Surgery

## 2022-03-15 HISTORY — PX: BREAST LUMPECTOMY: SHX2

## 2022-03-16 ENCOUNTER — Other Ambulatory Visit: Payer: Self-pay | Admitting: Anatomic Pathology & Clinical Pathology

## 2022-03-16 LAB — SURGICAL PATHOLOGY

## 2022-03-31 ENCOUNTER — Inpatient Hospital Stay (HOSPITAL_BASED_OUTPATIENT_CLINIC_OR_DEPARTMENT_OTHER): Payer: Medicare Other | Admitting: Oncology

## 2022-03-31 ENCOUNTER — Encounter: Payer: Self-pay | Admitting: Oncology

## 2022-03-31 ENCOUNTER — Inpatient Hospital Stay: Payer: Medicare Other

## 2022-03-31 ENCOUNTER — Inpatient Hospital Stay: Payer: Medicare Other | Attending: Oncology

## 2022-03-31 VITALS — BP 126/65 | HR 60

## 2022-03-31 VITALS — BP 133/59 | HR 49 | Temp 98.2°F | Wt 166.1 lb

## 2022-03-31 DIAGNOSIS — D649 Anemia, unspecified: Secondary | ICD-10-CM | POA: Diagnosis not present

## 2022-03-31 DIAGNOSIS — C50919 Malignant neoplasm of unspecified site of unspecified female breast: Secondary | ICD-10-CM

## 2022-03-31 DIAGNOSIS — D631 Anemia in chronic kidney disease: Secondary | ICD-10-CM | POA: Insufficient documentation

## 2022-03-31 DIAGNOSIS — Z79899 Other long term (current) drug therapy: Secondary | ICD-10-CM | POA: Insufficient documentation

## 2022-03-31 DIAGNOSIS — N184 Chronic kidney disease, stage 4 (severe): Secondary | ICD-10-CM | POA: Insufficient documentation

## 2022-03-31 DIAGNOSIS — D509 Iron deficiency anemia, unspecified: Secondary | ICD-10-CM | POA: Diagnosis not present

## 2022-03-31 DIAGNOSIS — Z7982 Long term (current) use of aspirin: Secondary | ICD-10-CM | POA: Diagnosis not present

## 2022-03-31 DIAGNOSIS — C50212 Malignant neoplasm of upper-inner quadrant of left female breast: Secondary | ICD-10-CM | POA: Insufficient documentation

## 2022-03-31 DIAGNOSIS — Z7901 Long term (current) use of anticoagulants: Secondary | ICD-10-CM | POA: Insufficient documentation

## 2022-03-31 DIAGNOSIS — Z170 Estrogen receptor positive status [ER+]: Secondary | ICD-10-CM | POA: Diagnosis not present

## 2022-03-31 DIAGNOSIS — Z87891 Personal history of nicotine dependence: Secondary | ICD-10-CM | POA: Insufficient documentation

## 2022-03-31 LAB — CBC WITH DIFFERENTIAL/PLATELET
Abs Immature Granulocytes: 0.01 10*3/uL (ref 0.00–0.07)
Basophils Absolute: 0.1 10*3/uL (ref 0.0–0.1)
Basophils Relative: 1 %
Eosinophils Absolute: 0.6 10*3/uL — ABNORMAL HIGH (ref 0.0–0.5)
Eosinophils Relative: 11 %
HCT: 27.2 % — ABNORMAL LOW (ref 36.0–46.0)
Hemoglobin: 8 g/dL — ABNORMAL LOW (ref 12.0–15.0)
Immature Granulocytes: 0 %
Lymphocytes Relative: 15 %
Lymphs Abs: 0.7 10*3/uL (ref 0.7–4.0)
MCH: 27.9 pg (ref 26.0–34.0)
MCHC: 29.4 g/dL — ABNORMAL LOW (ref 30.0–36.0)
MCV: 94.8 fL (ref 80.0–100.0)
Monocytes Absolute: 0.5 10*3/uL (ref 0.1–1.0)
Monocytes Relative: 10 %
Neutro Abs: 3.1 10*3/uL (ref 1.7–7.7)
Neutrophils Relative %: 63 %
Platelets: 169 10*3/uL (ref 150–400)
RBC: 2.87 MIL/uL — ABNORMAL LOW (ref 3.87–5.11)
RDW: 14.5 % (ref 11.5–15.5)
WBC: 4.9 10*3/uL (ref 4.0–10.5)
nRBC: 0 % (ref 0.0–0.2)

## 2022-03-31 MED ORDER — EPOETIN ALFA-EPBX 40000 UNIT/ML IJ SOLN: 60000.0000 [IU] | Freq: Once | INTRAMUSCULAR | Status: DC

## 2022-03-31 MED ORDER — EPOETIN ALFA-EPBX 10000 UNIT/ML IJ SOLN
20000.0000 [IU] | Freq: Once | INTRAMUSCULAR | Status: AC
  Administered 2022-03-31: 20000 [IU] via SUBCUTANEOUS
  Filled 2022-03-31: qty 2

## 2022-03-31 MED ORDER — EPOETIN ALFA-EPBX 40000 UNIT/ML IJ SOLN
40000.0000 [IU] | Freq: Once | INTRAMUSCULAR | Status: AC
  Administered 2022-03-31: 40000 [IU] via SUBCUTANEOUS
  Filled 2022-03-31: qty 1

## 2022-03-31 NOTE — Progress Notes (Signed)
?Hematology/Oncology Progress note ?[phone] [phone] ?  ? ? ? ?Patient Care Team: ?Maryland Pink, MD as PCP - General (Family Medicine) ?Earlie Server, MD as Consulting Physician (Hematology and Oncology) ?Theodore Demark, RN as Oncology Nurse Navigator ? ?REFERRING PROVIDER: ?Maryland Pink, MD ? ?REASON FOR VISIT ?Follow up for treatment of anemia of chronic kidney disease. ? ?HISTORY OF PRESENTING ILLNESS:  ?She has extensive cardiac comorbidities including history of aortic mechanic valve replacement, aneurysm of aorta, PAF, Coronary artery disease. She was on Amiodarone and also had drug induced hyperthyroidism.  ?She was sent to me for evaluation of labile INR.  ?Extensive review of her medical records from primary care physician's office showed  ?10/27/2017 INR 5.9,  ?10/24/2017 INR 4.9 ?10/12/2017 INR 1.2 ?10/07/2017 INR 6 ? 10/04/2017 INR 8.1  ? 08/11/2017 INR 7.5 ? 05/24/2017 INR 3.4 ?04/26/2017 INR 3.5 ?03/15/2017 INR 3.4 ?01/31/2017 INR 2.7 ? ?08/29/2017 Folate 2.8,  B12 825 and the patient was started on folic acid supplementation. ? 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 Amiodarone was stopped. And patient was referred to endocrinologist for evaluation. She was found out to have Graves' disease and was started on prednisone. Last TSH that was done in the [place] system showed a TSH of 4.8. ? ?# 3/9-3/13/2020  hospitalized due to weakness and noted to have acute on chronic anemia, black tarry stool. 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  Biopsy was not done due to elevated INR and recent bleed.  Colonoscopy showing 3 to 4 mm sized sessile polyps.  Biopsies were not done.  Patient received PRBC transfusions during her admission ? ?# #Abnormal CT finding:  Patient had CT abdomen pelvis without contrast on 06/28/2018 for evaluation of right lower abdomen swelling.  There were incidental findings of 2 cm soft tissue nodule in the left posterior mediastinum.  Questionable enlarged lymph nodes.  ?I have previously discussed with patient about need of repeating CT scan of chest to evaluate stability.  I have discussed with her on multiple occasion. Patient declined.  ? ?# Chronic anemia due to CKD on erythropoietin therapy ? ?# 01/07/2021, bilateral breast screening mammogram ?01/19/2021 right diagnostic mammogram: 1.8 cm intraductal mass in the retroareolar right ?breast at 6 o'clock.  There is a hypoechoic 3 mm round mass in the right breast at 8 o'clock, 4 cm from the nipple.No evidence of right axillary lymphadenopathy ?01/29/2021, right breast 6:00 mass biopsy showed sclerosing intraductal papilloma.  Negative for malignancy ?Right breast 8:00 biopsy showed cystically dilated duct with associated histiocytes and a sclerosis.  Usual ductal hyperplasia.  Negative for atypia or malignancy. ? ?03/01/2021, patient underwent right breast excisional biopsy.  Pathology showed  ?-Superior medial site :sclerosing intraductal papilloma, focal atypical lobular hyperplasia, extensive duct dilatation with periductal sclerosis.  Usual ductal hyperplasia. ?-Inferior lateral site: Focal atypical lobular hyperplasia, ductal dilatation with periductal sclerosis.  Usual ductal hyperplasia.   ? ?Post operation, patient developed seroma patient was seen by Dr. Peyton Najjar on 04/13/2021.  Patient is healing slowly but adequately.  No signs of infection. ? ?#Atypical lobular hyperplasia: AH confers a moderate increase in the risk of subsequent breast cancer (relative risk 3 to 5).  Recommend active surveillance. Options of chemoprevention was discussed with the patient and potential side effects were discussed.  Patient is not interested.  ? ?  01/19/2022, bilateral screening mammogram showed possible mass  and asymmetry in the left breast. ?02/08/2022, unilateral left breast diagnostic mammogram showed suspicious mass 7 x 6 x 6 mm in left breast, 1030 o'clock.  Anterior to the suspicious mass is an additional small lobular mass which is not visualized on ultrasound. No left axillary adenopathy identified. 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  ER 90% positive, PR 51-90%, HER2 IHC negative ? Left breast upper outer quadrant stereotactic core needle biopsy showed benign mammary parenchyma with mild fibrocystic changes and focal usual ductal hyperplasia.  Negative for atypical proliferative breast disease. ? ? ? ?INTERVAL HISTORY ?71 y.o. female  presents for follow-up for evaluation of breast cancer ?She has chronic anemia due to CKD, she has been on Retacrit 60,000 units every 8 weeks [patient prefers] and the left breast cancer management.   ? ?03/14/2022, patient is status post left breast lumpectomy ?Pathology showed left mammary carcinoma, no special type, 2 sentinel lymph nodes were excised and both were negative.  Margins are negative for invasive carcinoma. pT1b pN0.  Previous biopsy showed ER positive, PR positive HER2 negative ? ?Patient reports feeling well.  No concern of surgical sites.  No fever or chills. ? ?Review of Systems  ?Constitutional:  Positive for malaise/fatigue. Negative for chills, fever and weight loss.  ?HENT:  Negative for sore throat.   ?Eyes:  Negative for redness.  ?Respiratory:  Negative for cough, shortness of breath and wheezing.   ?Cardiovascular:  Negative for chest pain, palpitations and leg swelling.  ?Gastrointestinal:  Negative for abdominal pain, blood in stool, nausea and vomiting.  ?Genitourinary:  Negative for dysuria.  ?Musculoskeletal:  Negative for myalgias.  ?Skin:  Negative for rash.  ?Neurological:  Negative for dizziness, tingling and tremors.   ?Endo/Heme/Allergies:  Does not bruise/bleed easily.  ?Psychiatric/Behavioral:  Negative for hallucinations.   ? ?MEDICAL HISTORY:  ?Past Medical History:  ?Diagnosis Date  ? (HFpEF) heart failure with preserved ejection fraction ([place])   ? Anemia in chronic kidney disease (CKD) 11/14/2017  ? Aneurysm of infrarenal abdominal aorta (HCC)   ? a.) measured 2.3 x 2.8 cm on 11/15/2019  ? 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 a.) measured 1.9 x 1.9 on 11/15/2019  ? Anxiety   ? Aortic atherosclerosis ([place])   ? Aortic valve regurgitation   ? a.) severe; s/p placement of St. Jude mechanical valve 09/19/2003  ? Arthritis   ? Breast cancer, left ([place]) 02/22/2022  ? a.) CNB 02/22/2022 (+) for invasive mammary carcinoma; stage IA (cT1b, cN0, cM0, G1, ER+, PR+, HER2-).  ? 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 CKD (chronic kidney disease) stage 4, GFR 15-29 ml/min (HCC)   ? Coronary artery disease   ? a.) LHC 05/07/2015: 50% RPDA, 50% inf septal, 50% mLCx, 50% pLAD, 40% mRCA; intervention deferred opting for medical mgmt.  ? Current use of long term anticoagulation   ? a.) warfarin  ? Dizziness   ? Edema   ? GERD (gastroesophageal reflux disease)   ? Gout   ? History of GI bleed   ? Hypercholesterolemia   ? Hypertension   ? Intraductal papilloma of breast, 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  ? Permanent atrial fibrillation (HCC)   ? a.) CHA2DS2-VASc = 7 (age, sex, HFpEF, HTN, CVA x 2, PVD). b.) s/p DCCV 2016 and TEE with cardioversion 09/2018. c.)  rate/rhythm  maintained without pharmacological intervention; chronically anticoagulated on warfarin  ? Pseudoaneurysm of aorta (HCC)   ? a.) proximal; enhancing portion measured 4.6 x 3.8 x 3.2 cm on 11/15/2019  ? PVD (peripheral vascular disease) ([place])   ? a.) s/p axillofemoral and femorofemoral bypasses  ? Shortness of breath dyspnea   ? Sleep apnea   ? 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 a.) 28 mm Hemashield like graft   ? ? ?SURGICAL HISTORY: ?Past Surgical History:  ?Procedure Laterality Date  ? ABDOMINAL HYSTERECTOMY    ? AORTIC VALVE REPLACEMENT N/A 09/19/2003  ? BREAST BIOPSY Right 01/29/2021  ? [place] Bx, 6:00 vision clip --> sclerosin

## 2022-04-07 ENCOUNTER — Ambulatory Visit
Admission: RE | Admit: 2022-04-07 | Discharge: 2022-04-07 | Disposition: A | Discharge status: Home / Self Care | Payer: Medicare Other | Source: Ambulatory Visit | Attending: Radiation Oncology | Admitting: Radiation Oncology

## 2022-04-07 VITALS — BP 120/67 | HR 54 | Temp 96.8°F | Resp 18 | Ht 61.0 in | Wt 167.4 lb

## 2022-04-07 DIAGNOSIS — Z79899 Other long term (current) drug therapy: Secondary | ICD-10-CM | POA: Diagnosis not present

## 2022-04-07 DIAGNOSIS — I5032 Chronic diastolic (congestive) heart failure: Secondary | ICD-10-CM | POA: Insufficient documentation

## 2022-04-07 DIAGNOSIS — Z7901 Long term (current) use of anticoagulants: Secondary | ICD-10-CM | POA: Insufficient documentation

## 2022-04-07 DIAGNOSIS — I739 Peripheral vascular disease, unspecified: Secondary | ICD-10-CM | POA: Insufficient documentation

## 2022-04-07 DIAGNOSIS — G4730 Sleep apnea, unspecified: Secondary | ICD-10-CM | POA: Insufficient documentation

## 2022-04-07 DIAGNOSIS — I2510 Atherosclerotic heart disease of native coronary artery without angina pectoris: Secondary | ICD-10-CM | POA: Diagnosis not present

## 2022-04-07 DIAGNOSIS — N184 Chronic kidney disease, stage 4 (severe): Secondary | ICD-10-CM | POA: Insufficient documentation

## 2022-04-07 DIAGNOSIS — K219 Gastro-esophageal reflux disease without esophagitis: Secondary | ICD-10-CM | POA: Insufficient documentation

## 2022-04-07 DIAGNOSIS — C50912 Malignant neoplasm of unspecified site of left female breast: Secondary | ICD-10-CM | POA: Insufficient documentation

## 2022-04-07 DIAGNOSIS — Z8673 Personal history of transient ischemic attack (TIA), and cerebral infarction without residual deficits: Secondary | ICD-10-CM | POA: Insufficient documentation

## 2022-04-07 DIAGNOSIS — D631 Anemia in chronic kidney disease: Secondary | ICD-10-CM | POA: Insufficient documentation

## 2022-04-07 DIAGNOSIS — I4821 Permanent atrial fibrillation: Secondary | ICD-10-CM | POA: Diagnosis not present

## 2022-04-07 DIAGNOSIS — Z8042 Family history of malignant neoplasm of prostate: Secondary | ICD-10-CM | POA: Diagnosis not present

## 2022-04-07 DIAGNOSIS — Z87891 Personal history of nicotine dependence: Secondary | ICD-10-CM | POA: Diagnosis not present

## 2022-04-07 DIAGNOSIS — I130 Hypertensive heart and chronic kidney disease with heart failure and stage 1 through stage 4 chronic kidney disease, or unspecified chronic kidney disease: Secondary | ICD-10-CM | POA: Insufficient documentation

## 2022-04-07 DIAGNOSIS — E7800 Pure hypercholesterolemia, unspecified: Secondary | ICD-10-CM | POA: Insufficient documentation

## 2022-04-07 DIAGNOSIS — Z7982 Long term (current) use of aspirin: Secondary | ICD-10-CM | POA: Diagnosis not present

## 2022-04-07 DIAGNOSIS — Z800 Family history of malignant neoplasm of digestive organs: Secondary | ICD-10-CM | POA: Diagnosis not present

## 2022-04-07 DIAGNOSIS — Z170 Estrogen receptor positive status [ER+]: Secondary | ICD-10-CM | POA: Diagnosis not present

## 2022-04-07 DIAGNOSIS — C50919 Malignant neoplasm of unspecified site of unspecified female breast: Secondary | ICD-10-CM
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
## 2022-04-08 ENCOUNTER — Telehealth: Payer: Self-pay | Admitting: *Deleted

## 2022-04-08 NOTE — Telephone Encounter (Signed)
Patient called stating that she is to see Dr Baruch Gouty and "be set up for CT for where to get my radiation". She states that in the mean time, Dr Ysidro Evert has called wanting to do heart surgery on her and also wants her to do a CT. She is unsure of which she needs to do first and would appreciate a return call to help her sort this out. ? ?Telephone Encounter - Burk[place], Morey Hummingbird, NP - 04/05/2022 10:34 AM EDT ?Formatting of this note might be different from the original. ?Spoke to patient after reaching out to nephrology in regards to plan for contrasted CT imaging. She will be admitted to Dr. Ysidro Evert service for overnight hydration prior to CTA imaging the following morning with post imaging hydration prior to discharge. She will choose a date of admission following her radiation oncology appointment later this week and will call the office back in regards to scheduling this. ?Netta Corrigan, NP  ? ?Electronically signed by Netta Corrigan, NP at 04/05/2022 10:36 AM EDT  ?

## 2022-04-15 ENCOUNTER — Ambulatory Visit
Admission: RE | Admit: 2022-04-15 | Discharge: 2022-04-15 | Disposition: A | Discharge status: Home / Self Care | Payer: Medicare Other | Source: Ambulatory Visit | Attending: Radiation Oncology | Admitting: Radiation Oncology

## 2022-04-15 ENCOUNTER — Other Ambulatory Visit: Payer: Self-pay | Admitting: *Deleted

## 2022-04-15 DIAGNOSIS — C50212 Malignant neoplasm of upper-inner quadrant of left female breast: Secondary | ICD-10-CM | POA: Diagnosis present

## 2022-04-15 DIAGNOSIS — Z510 Encounter for antineoplastic radiation therapy: Secondary | ICD-10-CM | POA: Diagnosis present

## 2022-04-15 DIAGNOSIS — N184 Chronic kidney disease, stage 4 (severe): Secondary | ICD-10-CM | POA: Insufficient documentation

## 2022-04-15 DIAGNOSIS — D631 Anemia in chronic kidney disease: Secondary | ICD-10-CM | POA: Diagnosis not present

## 2022-04-15 DIAGNOSIS — Z170 Estrogen receptor positive status [ER+]: Secondary | ICD-10-CM | POA: Insufficient documentation

## 2022-04-15 DIAGNOSIS — C50919 Malignant neoplasm of unspecified site of unspecified female breast: Secondary | ICD-10-CM

## 2022-04-19 DIAGNOSIS — Z510 Encounter for antineoplastic radiation therapy: Secondary | ICD-10-CM | POA: Diagnosis not present

## 2022-04-21 ENCOUNTER — Ambulatory Visit: Admission: RE | Admit: 2022-04-21 | Payer: Medicare Other | Source: Ambulatory Visit

## 2022-04-21 DIAGNOSIS — Z510 Encounter for antineoplastic radiation therapy: Secondary | ICD-10-CM | POA: Diagnosis not present

## 2022-04-25 ENCOUNTER — Ambulatory Visit
Admission: RE | Admit: 2022-04-25 | Discharge: 2022-04-25 | Disposition: A | Discharge status: Home / Self Care | Payer: Medicare Other | Source: Ambulatory Visit | Attending: Radiation Oncology | Admitting: Radiation Oncology

## 2022-04-25 ENCOUNTER — Other Ambulatory Visit: Payer: Self-pay

## 2022-04-25 DIAGNOSIS — Z510 Encounter for antineoplastic radiation therapy: Secondary | ICD-10-CM | POA: Diagnosis not present

## 2022-04-25 LAB — RAD ONC ARIA SESSION SUMMARY
Course Elapsed Days: 0
Plan Fractions Treated to Date: 1
Plan Prescribed Dose Per Fraction: 2.66 Gy
Plan Total Fractions Prescribed: 16
Plan Total Prescribed Dose: 42.56 Gy
Reference Point Dosage Given to Date: 2.66 Gy
Reference Point Session Dosage Given: 2.66 Gy
Session Number: 1

## 2022-04-26 ENCOUNTER — Ambulatory Visit
Admission: RE | Admit: 2022-04-26 | Discharge: 2022-04-26 | Disposition: A | Discharge status: Home / Self Care | Payer: Medicare Other | Source: Ambulatory Visit | Attending: Radiation Oncology | Admitting: Radiation Oncology

## 2022-04-26 ENCOUNTER — Other Ambulatory Visit: Payer: Self-pay

## 2022-04-26 DIAGNOSIS — Z510 Encounter for antineoplastic radiation therapy: Secondary | ICD-10-CM | POA: Diagnosis not present

## 2022-04-26 LAB — RAD ONC ARIA SESSION SUMMARY
Course Elapsed Days: 1
Plan Fractions Treated to Date: 2
Plan Prescribed Dose Per Fraction: 2.66 Gy
Plan Total Fractions Prescribed: 16
Plan Total Prescribed Dose: 42.56 Gy
Reference Point Dosage Given to Date: 5.32 Gy
Reference Point Session Dosage Given: 2.66 Gy
Session Number: 2

## 2022-04-27 ENCOUNTER — Other Ambulatory Visit: Payer: Self-pay

## 2022-04-27 ENCOUNTER — Ambulatory Visit
Admission: RE | Admit: 2022-04-27 | Discharge: 2022-04-27 | Disposition: A | Discharge status: Home / Self Care | Payer: Medicare Other | Source: Ambulatory Visit | Attending: Radiation Oncology | Admitting: Radiation Oncology

## 2022-04-27 DIAGNOSIS — Z510 Encounter for antineoplastic radiation therapy: Secondary | ICD-10-CM | POA: Diagnosis not present

## 2022-04-27 LAB — RAD ONC ARIA SESSION SUMMARY
Course Elapsed Days: 2
Plan Fractions Treated to Date: 3
Plan Prescribed Dose Per Fraction: 2.66 Gy
Plan Total Fractions Prescribed: 16
Plan Total Prescribed Dose: 42.56 Gy
Reference Point Dosage Given to Date: 7.98 Gy
Reference Point Session Dosage Given: 2.66 Gy
Session Number: 3

## 2022-04-28 ENCOUNTER — Other Ambulatory Visit: Payer: Self-pay

## 2022-04-28 ENCOUNTER — Ambulatory Visit
Admission: RE | Admit: 2022-04-28 | Discharge: 2022-04-28 | Disposition: A | Discharge status: Home / Self Care | Payer: Medicare Other | Source: Ambulatory Visit | Attending: Radiation Oncology | Admitting: Radiation Oncology

## 2022-04-28 DIAGNOSIS — Z510 Encounter for antineoplastic radiation therapy: Secondary | ICD-10-CM | POA: Diagnosis not present

## 2022-04-28 LAB — RAD ONC ARIA SESSION SUMMARY
Course Elapsed Days: 3
Plan Fractions Treated to Date: 4
Plan Prescribed Dose Per Fraction: 2.66 Gy
Plan Total Fractions Prescribed: 16
Plan Total Prescribed Dose: 42.56 Gy
Reference Point Dosage Given to Date: 10.64 Gy
Reference Point Session Dosage Given: 2.66 Gy
Session Number: 4

## 2022-04-29 ENCOUNTER — Other Ambulatory Visit: Payer: Self-pay

## 2022-04-29 ENCOUNTER — Ambulatory Visit
Admission: RE | Admit: 2022-04-29 | Discharge: 2022-04-29 | Disposition: A | Discharge status: Home / Self Care | Payer: Medicare Other | Source: Ambulatory Visit | Attending: Radiation Oncology | Admitting: Radiation Oncology

## 2022-04-29 DIAGNOSIS — Z510 Encounter for antineoplastic radiation therapy: Secondary | ICD-10-CM | POA: Diagnosis not present

## 2022-04-29 LAB — RAD ONC ARIA SESSION SUMMARY
Course Elapsed Days: 4
Plan Fractions Treated to Date: 5
Plan Prescribed Dose Per Fraction: 2.66 Gy
Plan Total Fractions Prescribed: 16
Plan Total Prescribed Dose: 42.56 Gy
Reference Point Dosage Given to Date: 13.3 Gy
Reference Point Session Dosage Given: 2.66 Gy
Session Number: 5

## 2022-05-02 ENCOUNTER — Ambulatory Visit
Admission: RE | Admit: 2022-05-02 | Discharge: 2022-05-02 | Disposition: A | Discharge status: Home / Self Care | Payer: Medicare Other | Source: Ambulatory Visit | Attending: Radiation Oncology | Admitting: Radiation Oncology

## 2022-05-02 ENCOUNTER — Other Ambulatory Visit: Payer: Self-pay

## 2022-05-02 DIAGNOSIS — Z510 Encounter for antineoplastic radiation therapy: Secondary | ICD-10-CM | POA: Diagnosis not present

## 2022-05-02 LAB — RAD ONC ARIA SESSION SUMMARY
Course Elapsed Days: 7
Plan Fractions Treated to Date: 6
Plan Prescribed Dose Per Fraction: 2.66 Gy
Plan Total Fractions Prescribed: 16
Plan Total Prescribed Dose: 42.56 Gy
Reference Point Dosage Given to Date: 15.96 Gy
Reference Point Session Dosage Given: 2.66 Gy
Session Number: 6

## 2022-05-03 ENCOUNTER — Other Ambulatory Visit: Payer: Self-pay

## 2022-05-03 ENCOUNTER — Ambulatory Visit
Admission: RE | Admit: 2022-05-03 | Discharge: 2022-05-03 | Disposition: A | Discharge status: Home / Self Care | Payer: Medicare Other | Source: Ambulatory Visit | Attending: Radiation Oncology | Admitting: Radiation Oncology

## 2022-05-03 DIAGNOSIS — Z510 Encounter for antineoplastic radiation therapy: Secondary | ICD-10-CM | POA: Diagnosis not present

## 2022-05-03 LAB — RAD ONC ARIA SESSION SUMMARY
Course Elapsed Days: 8
Plan Fractions Treated to Date: 7
Plan Prescribed Dose Per Fraction: 2.66 Gy
Plan Total Fractions Prescribed: 16
Plan Total Prescribed Dose: 42.56 Gy
Reference Point Dosage Given to Date: 18.62 Gy
Reference Point Session Dosage Given: 2.66 Gy
Session Number: 7

## 2022-05-04 ENCOUNTER — Other Ambulatory Visit: Payer: Self-pay

## 2022-05-04 ENCOUNTER — Ambulatory Visit
Admission: RE | Admit: 2022-05-04 | Discharge: 2022-05-04 | Disposition: A | Discharge status: Home / Self Care | Payer: Medicare Other | Source: Ambulatory Visit | Attending: Radiation Oncology | Admitting: Radiation Oncology

## 2022-05-04 DIAGNOSIS — Z510 Encounter for antineoplastic radiation therapy: Secondary | ICD-10-CM | POA: Diagnosis not present

## 2022-05-04 LAB — RAD ONC ARIA SESSION SUMMARY
Course Elapsed Days: 9
Plan Fractions Treated to Date: 8
Plan Prescribed Dose Per Fraction: 2.66 Gy
Plan Total Fractions Prescribed: 16
Plan Total Prescribed Dose: 42.56 Gy
Reference Point Dosage Given to Date: 21.28 Gy
Reference Point Session Dosage Given: 2.66 Gy
Session Number: 8

## 2022-05-05 ENCOUNTER — Ambulatory Visit
Admission: RE | Admit: 2022-05-05 | Discharge: 2022-05-05 | Disposition: A | Discharge status: Home / Self Care | Payer: Medicare Other | Source: Ambulatory Visit | Attending: Radiation Oncology | Admitting: Radiation Oncology

## 2022-05-05 ENCOUNTER — Other Ambulatory Visit: Payer: Self-pay

## 2022-05-05 DIAGNOSIS — Z510 Encounter for antineoplastic radiation therapy: Secondary | ICD-10-CM | POA: Diagnosis not present

## 2022-05-05 LAB — RAD ONC ARIA SESSION SUMMARY
Course Elapsed Days: 10
Plan Fractions Treated to Date: 9
Plan Prescribed Dose Per Fraction: 2.66 Gy
Plan Total Fractions Prescribed: 16
Plan Total Prescribed Dose: 42.56 Gy
Reference Point Dosage Given to Date: 23.94 Gy
Reference Point Session Dosage Given: 2.66 Gy
Session Number: 9

## 2022-05-06 ENCOUNTER — Ambulatory Visit
Admission: RE | Admit: 2022-05-06 | Discharge: 2022-05-06 | Disposition: A | Discharge status: Home / Self Care | Payer: Medicare Other | Source: Ambulatory Visit | Attending: Radiation Oncology | Admitting: Radiation Oncology

## 2022-05-06 ENCOUNTER — Other Ambulatory Visit: Payer: Self-pay

## 2022-05-06 DIAGNOSIS — Z510 Encounter for antineoplastic radiation therapy: Secondary | ICD-10-CM | POA: Diagnosis not present

## 2022-05-06 LAB — RAD ONC ARIA SESSION SUMMARY
Course Elapsed Days: 11
Plan Fractions Treated to Date: 10
Plan Prescribed Dose Per Fraction: 2.66 Gy
Plan Total Fractions Prescribed: 16
Plan Total Prescribed Dose: 42.56 Gy
Reference Point Dosage Given to Date: 26.6 Gy
Reference Point Session Dosage Given: 2.66 Gy
Session Number: 10

## 2022-05-09 ENCOUNTER — Ambulatory Visit
Admission: EM | Admit: 2022-05-09 | Discharge: 2022-05-09 | Disposition: A | Discharge status: Other Acute Inpatient Hospital | Payer: Medicare Other | Attending: Urgent Care | Admitting: Urgent Care

## 2022-05-09 DIAGNOSIS — R42 Dizziness and giddiness: Secondary | ICD-10-CM | POA: Diagnosis present

## 2022-05-09 DIAGNOSIS — N184 Chronic kidney disease, stage 4 (severe): Secondary | ICD-10-CM | POA: Insufficient documentation

## 2022-05-09 DIAGNOSIS — D649 Anemia, unspecified: Secondary | ICD-10-CM | POA: Insufficient documentation

## 2022-05-09 LAB — CBC WITH DIFFERENTIAL/PLATELET
Abs Immature Granulocytes: 0.01 10*3/uL (ref 0.00–0.07)
Basophils Absolute: 0 10*3/uL (ref 0.0–0.1)
Basophils Relative: 1 %
Eosinophils Absolute: 0.1 10*3/uL (ref 0.0–0.5)
Eosinophils Relative: 2 %
HCT: 26.3 % — ABNORMAL LOW (ref 36.0–46.0)
Hemoglobin: 7.8 g/dL — ABNORMAL LOW (ref 12.0–15.0)
Immature Granulocytes: 0 %
Lymphocytes Relative: 9 %
Lymphs Abs: 0.4 10*3/uL — ABNORMAL LOW (ref 0.7–4.0)
MCH: 26.9 pg (ref 26.0–34.0)
MCHC: 29.7 g/dL — ABNORMAL LOW (ref 30.0–36.0)
MCV: 90.7 fL (ref 80.0–100.0)
Monocytes Absolute: 0.3 10*3/uL (ref 0.1–1.0)
Monocytes Relative: 7 %
Neutro Abs: 3.8 10*3/uL (ref 1.7–7.7)
Neutrophils Relative %: 81 %
Platelets: 189 10*3/uL (ref 150–400)
RBC: 2.9 MIL/uL — ABNORMAL LOW (ref 3.87–5.11)
RDW: 15.4 % (ref 11.5–15.5)
WBC: 4.7 10*3/uL (ref 4.0–10.5)
nRBC: 0 % (ref 0.0–0.2)

## 2022-05-09 LAB — COMPREHENSIVE METABOLIC PANEL
ALT: 11 U/L (ref 0–44)
AST: 15 U/L (ref 15–41)
Albumin: 4 g/dL (ref 3.5–5.0)
Alkaline Phosphatase: 76 U/L (ref 38–126)
Anion gap: 10 (ref 5–15)
BUN: 66 mg/dL — ABNORMAL HIGH (ref 8–23)
CO2: 24 mmol/L (ref 22–32)
Calcium: 9.9 mg/dL (ref 8.9–10.3)
Chloride: 106 mmol/L (ref 98–111)
Creatinine, Ser: 3 mg/dL — ABNORMAL HIGH (ref 0.44–1.00)
GFR, Estimated: 16 mL/min — ABNORMAL LOW (ref >60)
Glucose, Bld: 104 mg/dL — ABNORMAL HIGH (ref 70–99)
Potassium: 4.1 mmol/L (ref 3.5–5.1)
Sodium: 140 mmol/L (ref 135–145)
Total Bilirubin: 0.5 mg/dL (ref 0.3–1.2)
Total Protein: 7.3 g/dL (ref 6.5–8.1)

## 2022-05-09 NOTE — Discharge Instructions (Signed)
Your CBC shows recently worsening anemia, Hgb 7.8. Patients with chronic kidney disease typically require a transfusion when Hgb levels drop below 8. Given your new onset of dizziness, a further workup in the ER would be recommended. A CT scan of the head would be recommended for further workup. Please head to the ER for a complete workup.

## 2022-05-09 NOTE — ED Notes (Signed)
Patient is being discharged from the Urgent Care and sent to the Emergency Department via pov . Per [facility], patient is in need of higher level of care due to [place], WORSENING ANEMIA. Patient is aware and verbalizes understanding of plan of care.  Vitals:   05/09/22 1235  BP: 127/73  Pulse: 66  Temp: 97.8 F (36.6 C)  SpO2: 100%

## 2022-05-09 NOTE — ED Provider Notes (Addendum)
[facility]    CSN: [id number] Arrival date & time: 05/09/22  1046      History   Chief Complaint No chief complaint on file.   HPI Jeanette Romero is a 71 y.o. female.   Pleasant 71yo female presents today due to the sensation of feeling lightheaded upon standing that started upon awakening just this morning. Reports last evening she was at her normal baseline health. She states that this morning when she woke up, she felt off balance. She is stumbling when walking and reports gait disturbance. She denies headache, slurred speech, weakness or numbness of an extremity, blurred vision or tinnitus. She denies SOB or CP. She recently started radiation therapy for breast cancer. She is taking no new medications. She has known CKD stage 4, last GFR 20 on 04/19/22. Has longstanding hx of anemia, normal Hgb usually around 8.3. She has anticoagulation therapy due to mechanical heart valve, last INR was 2.3. Has a check tomorrow. Denies any trauma or injury to the head. She denies nausea or vomiting. Rapid head movements do not worsen her dizzy sensation, only standing and walking. She did have vertigo 10-20 years ago but feels this is different. Took all of her normal home medications this morning.    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 Jude mechanical valve 09/19/2003   Arthritis    Breast cancer, left ([place]) 02/22/2022   a.) CNB 02/22/2022 (+) for invasive mammary carcinoma; stage IA (cT1b, cN0, cM0, G1, ER+, PR+, HER2-).   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 b.) s/p DCCV 2016 and TEE with cardioversion 09/2018. 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   a.) 28 mm Hemashield like graft    Patient Active Problem List   Diagnosis Date Noted   Atypical lobular hyperplasia [facility]) of breast 02/28/2022   Invasive carcinoma of breast ([place]) 02/28/2022   Goals of care, counseling/discussion 02/28/2022   Benign neoplasm of ascending colon  Benign neoplasm of cecum    Internal hemorrhoids    Elevated troponin    Columnar-lined esophagus    Melena    Acute posthemorrhagic anemia    Demand ischemia of myocardium (HCC)    Symptomatic anemia 02/17/2019   Anemia of chronic renal failure 11/14/2017   Normocytic anemia 10/31/2017   Fatigue associated with anemia 10/31/2017   Depression 10/31/2017   Hyperthyroidism 10/31/2017   Chronic anticoagulation 10/31/2017   Bradycardia 05/05/2015    Past Surgical History:  Procedure Laterality Date   ABDOMINAL HYSTERECTOMY     AORTIC VALVE REPLACEMENT N/A  09/19/2003   BREAST BIOPSY Right 01/29/2021   [place] Bx, 6:00 vision clip --> sclerosing intraductal papilloma; (-) for atypia and malignancy   BREAST BIOPSY Right 01/29/2021   [place] Bx, 8:00 Ribbon clip --> usual ductal hyperplasia; (-) for atypia and malignancy   CARDIAC CATHETERIZATION N/A 05/07/2015   Procedure: Left Heart Cath;  Surgeon: [name], MD;  Location: [place] [facility];  Service: Cardiovascular;  Laterality: N/A;   COLONOSCOPY Left 02/20/2019   Procedure: COLONOSCOPY;  Surgeon: Virgel Manifold, MD;  Location: [facility] ENDOSCOPY;  Service: Endoscopy;  Laterality: Left;   COLONOSCOPY N/A 02/21/2019   Procedure: COLONOSCOPY;  Surgeon: Virgel Manifold, MD;  Location: [facility] ENDOSCOPY;  Service: Endoscopy;  Laterality: N/A;   ELECTROPHYSIOLOGIC STUDY N/A 05/05/2015   Procedure: Cardioversion;  Surgeon: [name], MD;  Location: [facility] ORS;  Service: Cardiovascular;  Laterality: N/A;   ENTEROSCOPY N/A 02/21/2019   Procedure: ENTEROSCOPY;  Surgeon: Virgel Manifold, MD;  Location: [facility] ENDOSCOPY;  Service: Endoscopy;  Laterality: N/A;   ESOPHAGOGASTRODUODENOSCOPY Left 02/19/2019   Procedure: ESOPHAGOGASTRODUODENOSCOPY (EGD);  Surgeon: Virgel Manifold, MD;  Location: [facility] ENDOSCOPY;  Service: Endoscopy;  Laterality: Left;   EXCISION OF BREAST BIOPSY Right 03/01/2021   Procedure: EXCISION OF BREAST BIOPSY w/ radiofrequency tag;  Surgeon: Herbert Pun, MD;  Location: [facility] ORS;  Service: General;  Laterality: Right;   PARTIAL MASTECTOMY WITH NEEDLE LOCALIZATION AND AXILLARY SENTINEL LYMPH NODE BX Left 03/14/2022   Procedure: PARTIAL MASTECTOMY WITH Radio Frequency tag AND AXILLARY SENTINEL LYMPH NODE BX;  Surgeon: Herbert Pun, MD;  Location: [facility] ORS;  Service: General;  Laterality: Left;   REPAIR OF ACUTE ASCENDING THORACIC AORTIC DISSECTION N/A 06/08/2003   Procedure: ASCENDING AORTIC DISSECTION REPAIR AND RESUSPENSION OF AORTIC VALVE (28 mm Hemashield  like graft); Location: [facility]; Surgeon: Druscilla Brownie, MD   REPLACEMENT TOTAL KNEE BILATERAL     TEE WITH CARDIOVERSION  09/26/2018    OB History   No obstetric history on file.      Home Medications    Prior to Admission medications   Medication Sig Start Date End Date Taking? Authorizing Provider  ALPRAZolam (XANAX) 0.25 MG tablet Take 0.25 mg by mouth 2 (two) times daily.   Yes [provider]  amLODipine (NORVASC) 10 MG tablet Take 10 mg by mouth daily.    Yes [provider]  amoxicillin (AMOXIL) 500 MG tablet Take 2,000 mg by mouth See admin instructions. Take 2000 mg by mouth one hour prior to dental procedures 01/17/19  Yes [provider]  aspirin EC 81 MG tablet Take 81 mg by mouth daily.   Yes [provider]  benazepril (LOTENSIN) 40 MG tablet Take 40 mg by mouth daily.   Yes [provider]  cloNIDine (CATAPRES) 0.3 MG tablet Take 0.3 mg by mouth 2 (two) times daily. 10/14/17  Yes [provider]  diphenhydramine-acetaminophen (  TYLENOL PM) 25-500 MG TABS tablet Take 1 tablet by mouth at bedtime as needed.   Yes [provider]  folic acid (FOLVITE) 1 MG tablet TAKE 1 TABLET(1 MG) BY MOUTH DAILY 07/15/21  Yes Earlie Server, MD  furosemide (LASIX) 20 MG tablet Take 20-40 mg by mouth daily. Take 20 mg by mouth on odd days and 40 mg on even days 12/14/18  Yes [provider]  hydrALAZINE (APRESOLINE) 100 MG tablet Take 100 mg by mouth 2 (two) times daily. 02/27/20  Yes [provider]  pantoprazole (PROTONIX) 20 MG tablet Take 1 tablet (20 mg total) by mouth daily. 02/23/19  Yes Gladstone Lighter, MD  rosuvastatin (CRESTOR) 5 MG tablet Take 5 mg by mouth at bedtime. 02/20/18  Yes [provider]  sodium bicarbonate 650 MG tablet Take 650 mg by mouth 2 (two) times daily. 05/18/20  Yes [provider]  traMADol (ULTRAM) 50 MG tablet Take 1 tablet (50 mg total) by mouth every 12 (twelve) hours as  needed. 02/25/20  Yes Sable Feil, PA-C  warfarin (COUMADIN) 4 MG tablet Take 4 mg by mouth See admin instructions. Take 4 mg by mouth daily except Wednesday Saturday. 01/13/19  Yes [provider]  warfarin (COUMADIN) 6 MG tablet Take 6 mg by mouth See admin instructions. Take 6 mg by mouth on Wednesday and Saturday. 05/21/19  Yes [provider]    Family History Family History  Problem Relation Age of Onset   Hypertension Mother    Colon cancer Mother    Prostate cancer Father    Kidney cancer Brother    Prostate cancer Brother    Prostate cancer Brother    Breast cancer Neg Hx     Social History Social History   Tobacco Use   Smoking status: Former    Packs/day: 0.50    Years: 20.00    Pack years: 10.00    Types: Cigarettes    Quit date: 2004    Years since quitting: 19.4   Smokeless tobacco: Never  Vaping Use   Vaping Use: Never used  Substance Use Topics   Alcohol use: Yes    Alcohol/week: 0.0 standard drinks    Comment: occas   Drug use: No     Allergies   Atorvastatin   Review of Systems Review of Systems As per hpi  Physical Exam Triage Vital Signs ED Triage Vitals  Enc Vitals Group     BP 05/09/22 1235 127/73     Pulse Rate 05/09/22 1235 66     Resp --      Temp 05/09/22 1235 97.8 F (36.6 C)     Temp Source 05/09/22 1235 Oral     SpO2 05/09/22 1235 100 %     Weight 05/09/22 1233 163 lb (73.9 kg)     Height 05/09/22 1233 '5\' 1"'  (1.549 m)     Head Circumference --      Peak Flow --      Pain Score 05/09/22 1233 0     Pain Loc --      Pain Edu? --      Excl. in [place]? --    Orthostatic VS for the past 24 hrs:  BP- Lying Pulse- Lying BP- Sitting Pulse- Sitting BP- Standing at 0 minutes Pulse- Standing at 0 minutes  05/09/22 1336 124/80 94 117/60 60 124/76 64    Updated Vital Signs BP 127/73 (BP Location: Left Arm)   Pulse 66   Temp 97.8  F (36.6 C) (Oral)   Ht '5\' 1"'  (1.549 m)   Wt 163 lb (73.9 kg)   SpO2 100%   BMI  30.80 kg/m   Visual Acuity Right Eye Distance:   Left Eye Distance:   Bilateral Distance:    Right Eye Near:   Left Eye Near:    Bilateral Near:     Physical Exam Vitals and nursing note reviewed. Exam conducted with a chaperone present.  Constitutional:      General: She is not in acute distress.    Appearance: Normal appearance. She is well-developed. She is not ill-appearing, toxic-appearing or diaphoretic.  HENT:     Head: Normocephalic and atraumatic.     Right Ear: Tympanic membrane, ear canal and external ear normal.     Left Ear: Tympanic membrane, ear canal and external ear normal.     Nose: Nose normal.     Mouth/Throat:     Mouth: Mucous membranes are moist.     Pharynx: No oropharyngeal exudate or posterior oropharyngeal erythema.  Eyes:     Extraocular Movements: Extraocular movements intact.     Conjunctiva/sclera: Conjunctivae normal.     Pupils: Pupils are equal, round, and reactive to light.  Cardiovascular:     Rate and Rhythm: Normal rate and regular rhythm.     Heart sounds: Murmur (harsh holosystolic in #2 R and L ICS) heard.  Pulmonary:     Effort: Pulmonary effort is normal. No respiratory distress.     Breath sounds: Normal breath sounds.  Abdominal:     Palpations: Abdomen is soft.     Tenderness: There is no abdominal tenderness.  Musculoskeletal:        General: No swelling.     Cervical back: Normal range of motion and neck supple. No rigidity or tenderness.  Lymphadenopathy:     Cervical: No cervical adenopathy.  Skin:    General: Skin is warm and dry.     Capillary Refill: Capillary refill takes less than 2 seconds.     Findings: No rash.  Neurological:     General: No focal deficit present.     Mental Status: She is alert and oriented to person, place, and time. Mental status is at baseline.     Cranial Nerves: Cranial nerves 2-12 are intact. No cranial nerve deficit or facial asymmetry.     Sensory: Sensation is intact. No sensory  deficit.     Motor: Motor function is intact. No weakness, tremor, atrophy or pronator drift.     Coordination: Coordination is intact. Romberg sign negative. Coordination normal. Finger-Nose-Finger Test normal.     Gait: Gait abnormal.     Deep Tendon Reflexes: Reflexes are normal and symmetric.  Psychiatric:        Mood and Affect: Mood normal.     UC Treatments / Results  Labs (all labs ordered are listed, but only abnormal results are displayed) Labs Reviewed  CBC WITH DIFFERENTIAL/PLATELET - Abnormal; Notable for the following components:      Result Value   RBC 2.90 (*)    Hemoglobin 7.8 (*)    HCT 26.3 (*)    MCHC 29.7 (*)    Lymphs Abs 0.4 (*)    All other components within normal limits  COMPREHENSIVE METABOLIC PANEL - Abnormal; Notable for the following components:   Glucose, Bld 104 (*)    BUN 66 (*)    Creatinine, Ser 3.00 (*)    GFR, Estimated 16 (*)    All  other components within normal limits    EKG   Radiology No results found.  Procedures Procedures (including critical care time)  Medications Ordered in UC Medications - No data to display  Initial Impression / Assessment and Plan / UC Course  I have reviewed the triage vital signs and the nursing notes.  Pertinent labs & imaging results that were available during my care of the patient were reviewed by me and considered in my medical decision making (see chart for details).     Dizziness - likely multifactorial. Given her gait instability however, I feel that CT is warranted to rule out cerebellar infarct. Does have borderline positive orthostatics which may be contributing to her symptoms.  Anemia - recently worsened. In the setting of CVD and CKD, concern that transfusion may be warranted to prevent worsening symptoms Stage 4 CKD - last GFR 20, now 16. Pt has been as low as 15 in the past. Pt follows with nephrology  Final Clinical Impressions(s) / UC Diagnoses   Final diagnoses:  Dizziness   Anemia, unspecified type  Stage 4 chronic kidney disease [facility])     Discharge Instructions      Your CBC shows recently worsening anemia, Hgb 7.8. Patients with chronic kidney disease typically require a transfusion when Hgb levels drop below 8. Given your new onset of dizziness, a further workup in the ER would be recommended. A CT scan of the head would be recommended for further workup. Please head to the ER for a complete workup.     ED Prescriptions   None    PDMP not reviewed this encounter.   Chaney Malling, [place] 05/09/22 1428    Chaney Malling, [place] 05/09/22 1658

## 2022-05-09 NOTE — ED Triage Notes (Signed)
Patient thinks she has vertigo.   Patient started this has been going on this AM -- when standing very off balance.   Patient denies any falls.

## 2022-05-10 ENCOUNTER — Other Ambulatory Visit: Payer: Self-pay

## 2022-05-10 ENCOUNTER — Ambulatory Visit
Admission: RE | Admit: 2022-05-10 | Discharge: 2022-05-10 | Disposition: A | Discharge status: Home / Self Care | Payer: Medicare Other | Source: Ambulatory Visit | Attending: Radiation Oncology | Admitting: Radiation Oncology

## 2022-05-10 DIAGNOSIS — Z510 Encounter for antineoplastic radiation therapy: Secondary | ICD-10-CM | POA: Diagnosis not present

## 2022-05-10 LAB — RAD ONC ARIA SESSION SUMMARY
Course Elapsed Days: 15
Plan Fractions Treated to Date: 11
Plan Prescribed Dose Per Fraction: 2.66 Gy
Plan Total Fractions Prescribed: 16
Plan Total Prescribed Dose: 42.56 Gy
Reference Point Dosage Given to Date: 29.26 Gy
Reference Point Session Dosage Given: 2.66 Gy
Session Number: 11

## 2022-05-11 ENCOUNTER — Inpatient Hospital Stay: Payer: Medicare Other

## 2022-05-11 ENCOUNTER — Ambulatory Visit
Admission: RE | Admit: 2022-05-11 | Discharge: 2022-05-11 | Disposition: A | Discharge status: Home / Self Care | Payer: Medicare Other | Source: Ambulatory Visit | Attending: Radiation Oncology | Admitting: Radiation Oncology

## 2022-05-11 ENCOUNTER — Other Ambulatory Visit: Payer: Self-pay

## 2022-05-11 DIAGNOSIS — N184 Chronic kidney disease, stage 4 (severe): Secondary | ICD-10-CM | POA: Insufficient documentation

## 2022-05-11 DIAGNOSIS — Z510 Encounter for antineoplastic radiation therapy: Secondary | ICD-10-CM | POA: Diagnosis not present

## 2022-05-11 DIAGNOSIS — D631 Anemia in chronic kidney disease: Secondary | ICD-10-CM | POA: Insufficient documentation

## 2022-05-11 DIAGNOSIS — C50919 Malignant neoplasm of unspecified site of unspecified female breast: Secondary | ICD-10-CM

## 2022-05-11 LAB — RAD ONC ARIA SESSION SUMMARY
Course Elapsed Days: 16
Plan Fractions Treated to Date: 12
Plan Prescribed Dose Per Fraction: 2.66 Gy
Plan Total Fractions Prescribed: 16
Plan Total Prescribed Dose: 42.56 Gy
Reference Point Dosage Given to Date: 31.92 Gy
Reference Point Session Dosage Given: 2.66 Gy
Session Number: 12

## 2022-05-11 LAB — CBC
HCT: 24.7 % — ABNORMAL LOW (ref 36.0–46.0)
Hemoglobin: 7.2 g/dL — ABNORMAL LOW (ref 12.0–15.0)
MCH: 27.4 pg (ref 26.0–34.0)
MCHC: 29.1 g/dL — ABNORMAL LOW (ref 30.0–36.0)
MCV: 93.9 fL (ref 80.0–100.0)
Platelets: 169 10*3/uL (ref 150–400)
RBC: 2.63 MIL/uL — ABNORMAL LOW (ref 3.87–5.11)
RDW: 15.4 % (ref 11.5–15.5)
WBC: 4.6 10*3/uL (ref 4.0–10.5)
nRBC: 0 % (ref 0.0–0.2)

## 2022-05-12 ENCOUNTER — Ambulatory Visit: Payer: Medicare Other

## 2022-05-12 ENCOUNTER — Ambulatory Visit: Admission: RE | Admit: 2022-05-12 | Payer: Medicare Other | Source: Ambulatory Visit

## 2022-05-13 ENCOUNTER — Other Ambulatory Visit: Payer: Self-pay

## 2022-05-13 ENCOUNTER — Ambulatory Visit
Admission: RE | Admit: 2022-05-13 | Discharge: 2022-05-13 | Disposition: A | Discharge status: Home / Self Care | Payer: Medicare Other | Source: Ambulatory Visit | Attending: Radiation Oncology | Admitting: Radiation Oncology

## 2022-05-13 DIAGNOSIS — Z510 Encounter for antineoplastic radiation therapy: Secondary | ICD-10-CM | POA: Insufficient documentation

## 2022-05-13 DIAGNOSIS — N184 Chronic kidney disease, stage 4 (severe): Secondary | ICD-10-CM | POA: Insufficient documentation

## 2022-05-13 DIAGNOSIS — Z170 Estrogen receptor positive status [ER+]: Secondary | ICD-10-CM | POA: Insufficient documentation

## 2022-05-13 DIAGNOSIS — D631 Anemia in chronic kidney disease: Secondary | ICD-10-CM | POA: Insufficient documentation

## 2022-05-13 DIAGNOSIS — C50212 Malignant neoplasm of upper-inner quadrant of left female breast: Secondary | ICD-10-CM | POA: Diagnosis present

## 2022-05-13 LAB — RAD ONC ARIA SESSION SUMMARY
Course Elapsed Days: 18
Plan Fractions Treated to Date: 13
Plan Prescribed Dose Per Fraction: 2.66 Gy
Plan Total Fractions Prescribed: 16
Plan Total Prescribed Dose: 42.56 Gy
Reference Point Dosage Given to Date: 34.58 Gy
Reference Point Session Dosage Given: 2.66 Gy
Session Number: 13

## 2022-05-16 ENCOUNTER — Other Ambulatory Visit: Payer: Self-pay

## 2022-05-16 ENCOUNTER — Ambulatory Visit
Admission: RE | Admit: 2022-05-16 | Discharge: 2022-05-16 | Disposition: A | Discharge status: Home / Self Care | Payer: Medicare Other | Source: Ambulatory Visit | Attending: Radiation Oncology | Admitting: Radiation Oncology

## 2022-05-16 DIAGNOSIS — Z510 Encounter for antineoplastic radiation therapy: Secondary | ICD-10-CM | POA: Diagnosis not present

## 2022-05-16 LAB — RAD ONC ARIA SESSION SUMMARY
Course Elapsed Days: 21
Plan Fractions Treated to Date: 14
Plan Prescribed Dose Per Fraction: 2.66 Gy
Plan Total Fractions Prescribed: 16
Plan Total Prescribed Dose: 42.56 Gy
Reference Point Dosage Given to Date: 37.24 Gy
Reference Point Session Dosage Given: 2.66 Gy
Session Number: 14

## 2022-05-17 ENCOUNTER — Ambulatory Visit
Admission: RE | Admit: 2022-05-17 | Discharge: 2022-05-17 | Disposition: A | Discharge status: Home / Self Care | Payer: Medicare Other | Source: Ambulatory Visit | Attending: Radiation Oncology | Admitting: Radiation Oncology

## 2022-05-17 ENCOUNTER — Other Ambulatory Visit: Payer: Self-pay

## 2022-05-17 DIAGNOSIS — Z510 Encounter for antineoplastic radiation therapy: Secondary | ICD-10-CM | POA: Diagnosis not present

## 2022-05-17 LAB — RAD ONC ARIA SESSION SUMMARY
Course Elapsed Days: 22
Plan Fractions Treated to Date: 15
Plan Prescribed Dose Per Fraction: 2.66 Gy
Plan Total Fractions Prescribed: 16
Plan Total Prescribed Dose: 42.56 Gy
Reference Point Dosage Given to Date: 39.9 Gy
Reference Point Session Dosage Given: 2.66 Gy
Session Number: 15

## 2022-05-18 ENCOUNTER — Ambulatory Visit: Payer: Medicare Other

## 2022-05-18 ENCOUNTER — Ambulatory Visit
Admission: RE | Admit: 2022-05-18 | Discharge: 2022-05-18 | Disposition: A | Discharge status: Home / Self Care | Payer: Medicare Other | Source: Ambulatory Visit | Attending: Radiation Oncology | Admitting: Radiation Oncology

## 2022-05-18 ENCOUNTER — Other Ambulatory Visit: Payer: Self-pay

## 2022-05-18 DIAGNOSIS — Z510 Encounter for antineoplastic radiation therapy: Secondary | ICD-10-CM | POA: Diagnosis not present

## 2022-05-18 LAB — RAD ONC ARIA SESSION SUMMARY
Course Elapsed Days: 23
Plan Fractions Treated to Date: 16
Plan Prescribed Dose Per Fraction: 2.66 Gy
Plan Total Fractions Prescribed: 16
Plan Total Prescribed Dose: 42.56 Gy
Reference Point Dosage Given to Date: 42.56 Gy
Reference Point Session Dosage Given: 2.66 Gy
Session Number: 16

## 2022-05-19 ENCOUNTER — Ambulatory Visit: Payer: Medicare Other

## 2022-05-19 ENCOUNTER — Other Ambulatory Visit: Payer: Self-pay

## 2022-05-19 ENCOUNTER — Other Ambulatory Visit: Payer: Self-pay | Admitting: Physician Assistant

## 2022-05-19 ENCOUNTER — Ambulatory Visit
Admission: RE | Admit: 2022-05-19 | Discharge: 2022-05-19 | Disposition: A | Discharge status: Home / Self Care | Payer: Medicare Other | Source: Ambulatory Visit | Attending: Radiation Oncology | Admitting: Radiation Oncology

## 2022-05-19 DIAGNOSIS — Z8673 Personal history of transient ischemic attack (TIA), and cerebral infarction without residual deficits: Secondary | ICD-10-CM

## 2022-05-19 DIAGNOSIS — I671 Cerebral aneurysm, nonruptured: Secondary | ICD-10-CM

## 2022-05-19 DIAGNOSIS — Z510 Encounter for antineoplastic radiation therapy: Secondary | ICD-10-CM | POA: Diagnosis not present

## 2022-05-19 LAB — RAD ONC ARIA SESSION SUMMARY
Course Elapsed Days: 24
Plan Fractions Treated to Date: 1
Plan Prescribed Dose Per Fraction: 2 Gy
Plan Total Fractions Prescribed: 5
Plan Total Prescribed Dose: 10 Gy
Reference Point Dosage Given to Date: 44.56 Gy
Reference Point Session Dosage Given: 2 Gy
Session Number: 17

## 2022-05-20 ENCOUNTER — Other Ambulatory Visit: Payer: Self-pay

## 2022-05-20 ENCOUNTER — Ambulatory Visit
Admission: RE | Admit: 2022-05-20 | Discharge: 2022-05-20 | Disposition: A | Discharge status: Home / Self Care | Payer: Medicare Other | Source: Ambulatory Visit | Attending: Radiation Oncology | Admitting: Radiation Oncology

## 2022-05-20 DIAGNOSIS — Z510 Encounter for antineoplastic radiation therapy: Secondary | ICD-10-CM | POA: Diagnosis not present

## 2022-05-20 LAB — RAD ONC ARIA SESSION SUMMARY
Course Elapsed Days: 25
Plan Fractions Treated to Date: 2
Plan Prescribed Dose Per Fraction: 2 Gy
Plan Total Fractions Prescribed: 5
Plan Total Prescribed Dose: 10 Gy
Reference Point Dosage Given to Date: 46.56 Gy
Reference Point Session Dosage Given: 2 Gy
Session Number: 18

## 2022-05-23 ENCOUNTER — Ambulatory Visit
Admission: RE | Admit: 2022-05-23 | Discharge: 2022-05-23 | Disposition: A | Discharge status: Home / Self Care | Payer: Medicare Other | Source: Ambulatory Visit | Attending: Radiation Oncology | Admitting: Radiation Oncology

## 2022-05-23 ENCOUNTER — Other Ambulatory Visit: Payer: Self-pay

## 2022-05-23 DIAGNOSIS — Z510 Encounter for antineoplastic radiation therapy: Secondary | ICD-10-CM | POA: Diagnosis not present

## 2022-05-23 LAB — RAD ONC ARIA SESSION SUMMARY
Course Elapsed Days: 28
Plan Fractions Treated to Date: 3
Plan Prescribed Dose Per Fraction: 2 Gy
Plan Total Fractions Prescribed: 5
Plan Total Prescribed Dose: 10 Gy
Reference Point Dosage Given to Date: 48.56 Gy
Reference Point Session Dosage Given: 2 Gy
Session Number: 19

## 2022-05-24 ENCOUNTER — Other Ambulatory Visit: Payer: Self-pay

## 2022-05-24 ENCOUNTER — Ambulatory Visit: Payer: Medicare Other

## 2022-05-24 ENCOUNTER — Ambulatory Visit
Admission: RE | Admit: 2022-05-24 | Discharge: 2022-05-24 | Disposition: A | Discharge status: Home / Self Care | Payer: Medicare Other | Source: Ambulatory Visit | Attending: Radiation Oncology | Admitting: Radiation Oncology

## 2022-05-24 ENCOUNTER — Inpatient Hospital Stay: Payer: Medicare Other | Attending: Oncology

## 2022-05-24 DIAGNOSIS — Z923 Personal history of irradiation: Secondary | ICD-10-CM | POA: Insufficient documentation

## 2022-05-24 DIAGNOSIS — Z170 Estrogen receptor positive status [ER+]: Secondary | ICD-10-CM | POA: Diagnosis not present

## 2022-05-24 DIAGNOSIS — C50212 Malignant neoplasm of upper-inner quadrant of left female breast: Secondary | ICD-10-CM | POA: Diagnosis not present

## 2022-05-24 DIAGNOSIS — Z79899 Other long term (current) drug therapy: Secondary | ICD-10-CM | POA: Diagnosis not present

## 2022-05-24 DIAGNOSIS — N184 Chronic kidney disease, stage 4 (severe): Secondary | ICD-10-CM | POA: Diagnosis present

## 2022-05-24 DIAGNOSIS — Z7982 Long term (current) use of aspirin: Secondary | ICD-10-CM | POA: Insufficient documentation

## 2022-05-24 DIAGNOSIS — Z7901 Long term (current) use of anticoagulants: Secondary | ICD-10-CM | POA: Diagnosis not present

## 2022-05-24 DIAGNOSIS — D631 Anemia in chronic kidney disease: Secondary | ICD-10-CM

## 2022-05-24 LAB — RAD ONC ARIA SESSION SUMMARY
Course Elapsed Days: 29
Plan Fractions Treated to Date: 4
Plan Prescribed Dose Per Fraction: 2 Gy
Plan Total Fractions Prescribed: 5
Plan Total Prescribed Dose: 10 Gy
Reference Point Dosage Given to Date: 50.56 Gy
Reference Point Session Dosage Given: 2 Gy
Session Number: 20

## 2022-05-24 LAB — CBC WITH DIFFERENTIAL/PLATELET
Abs Immature Granulocytes: 0.02 10*3/uL (ref 0.00–0.07)
Basophils Absolute: 0 10*3/uL (ref 0.0–0.1)
Basophils Relative: 1 %
Eosinophils Absolute: 0.2 10*3/uL (ref 0.0–0.5)
Eosinophils Relative: 5 %
HCT: 23.6 % — ABNORMAL LOW (ref 36.0–46.0)
Hemoglobin: 7 g/dL — ABNORMAL LOW (ref 12.0–15.0)
Immature Granulocytes: 1 %
Lymphocytes Relative: 10 %
Lymphs Abs: 0.4 10*3/uL — ABNORMAL LOW (ref 0.7–4.0)
MCH: 28.5 pg (ref 26.0–34.0)
MCHC: 29.7 g/dL — ABNORMAL LOW (ref 30.0–36.0)
MCV: 95.9 fL (ref 80.0–100.0)
Monocytes Absolute: 0.4 10*3/uL (ref 0.1–1.0)
Monocytes Relative: 11 %
Neutro Abs: 2.5 10*3/uL (ref 1.7–7.7)
Neutrophils Relative %: 72 %
Platelets: 169 10*3/uL (ref 150–400)
RBC: 2.46 MIL/uL — ABNORMAL LOW (ref 3.87–5.11)
RDW: 16.4 % — ABNORMAL HIGH (ref 11.5–15.5)
WBC: 3.5 10*3/uL — ABNORMAL LOW (ref 4.0–10.5)
nRBC: 0 % (ref 0.0–0.2)

## 2022-05-24 LAB — IRON AND TIBC
Iron: 41 ug/dL (ref 28–170)
Saturation Ratios: 12 % (ref 10.4–31.8)
TIBC: 339 ug/dL (ref 250–450)
UIBC: 298 ug/dL

## 2022-05-24 LAB — FERRITIN: Ferritin: 65 ng/mL (ref 11–307)

## 2022-05-25 ENCOUNTER — Ambulatory Visit
Admission: RE | Admit: 2022-05-25 | Discharge: 2022-05-25 | Disposition: A | Discharge status: Home / Self Care | Payer: Medicare Other | Source: Ambulatory Visit | Attending: Radiation Oncology | Admitting: Radiation Oncology

## 2022-05-25 ENCOUNTER — Other Ambulatory Visit: Payer: Self-pay

## 2022-05-25 DIAGNOSIS — N184 Chronic kidney disease, stage 4 (severe): Secondary | ICD-10-CM | POA: Diagnosis not present

## 2022-05-25 LAB — RAD ONC ARIA SESSION SUMMARY
Course Elapsed Days: 30
Plan Fractions Treated to Date: 5
Plan Prescribed Dose Per Fraction: 2 Gy
Plan Total Fractions Prescribed: 5
Plan Total Prescribed Dose: 10 Gy
Reference Point Dosage Given to Date: 52.56 Gy
Reference Point Session Dosage Given: 2 Gy
Session Number: 21

## 2022-05-26 ENCOUNTER — Encounter: Payer: Self-pay | Admitting: Oncology

## 2022-05-26 ENCOUNTER — Inpatient Hospital Stay (HOSPITAL_BASED_OUTPATIENT_CLINIC_OR_DEPARTMENT_OTHER): Payer: Medicare Other | Admitting: Oncology

## 2022-05-26 ENCOUNTER — Inpatient Hospital Stay: Payer: Medicare Other

## 2022-05-26 VITALS — BP 114/54 | HR 57 | Temp 96.4°F | Wt 164.0 lb

## 2022-05-26 DIAGNOSIS — N6099 Unspecified benign mammary dysplasia of unspecified breast: Secondary | ICD-10-CM | POA: Diagnosis not present

## 2022-05-26 DIAGNOSIS — C50919 Malignant neoplasm of unspecified site of unspecified female breast: Secondary | ICD-10-CM | POA: Diagnosis not present

## 2022-05-26 DIAGNOSIS — D631 Anemia in chronic kidney disease: Secondary | ICD-10-CM

## 2022-05-26 DIAGNOSIS — Z7901 Long term (current) use of anticoagulants: Secondary | ICD-10-CM | POA: Diagnosis not present

## 2022-05-26 DIAGNOSIS — N184 Chronic kidney disease, stage 4 (severe): Secondary | ICD-10-CM

## 2022-05-26 MED ORDER — EPOETIN ALFA-EPBX 40000 UNIT/ML IJ SOLN
40000.0000 [IU] | Freq: Once | INTRAMUSCULAR | Status: AC
  Administered 2022-05-26: 40000 [IU] via SUBCUTANEOUS
  Filled 2022-05-26: qty 1

## 2022-05-26 MED ORDER — ANASTROZOLE 1 MG PO TABS
1.0000 mg | ORAL_TABLET | Freq: Every day | ORAL | 3 refills | Status: DC
Start: 2022-05-26 — End: 2022-09-01

## 2022-05-26 MED ORDER — EPOETIN ALFA-EPBX 20000 UNIT/ML IJ SOLN
20000.0000 [IU] | Freq: Once | INTRAMUSCULAR | Status: AC
  Administered 2022-05-26: 20000 [IU] via SUBCUTANEOUS
  Filled 2022-05-26: qty 1

## 2022-05-26 MED ORDER — EPOETIN ALFA-EPBX 20000 UNIT/ML IJ SOLN: 60000.0000 [IU] | Freq: Once | INTRAMUSCULAR | Status: DC

## 2022-05-26 NOTE — Progress Notes (Addendum)
Hematology/Oncology Progress note [phone] [phone]      Patient Care Team: Maryland Pink, MD as PCP - General (Family Medicine) Earlie Server, MD as Consulting Physician (Hematology and Oncology) Theodore Demark, RN (Inactive) as Oncology Nurse Navigator  ASSESSMENT & PLAN:   Cancer Staging  Invasive carcinoma of breast [facility]) Staging form: Breast, AJCC 8th Edition - Pathologic stage from 03/31/2022: Stage IA (pT1b, pN0, cM0, G1, ER+, PR+, HER2-) - Signed by Earlie Server, MD on 03/31/2022   Anemia of chronic renal failure She is on Retacrit 60,000 units every 8 weeks per patient's preference due to financial cost. Proceed with Retacrit today. Due to low hemoglobin, she is okay to repeat blood work and proceed with Retacrit 60,000 units in 4 weeks.  Invasive carcinoma of breast ([place]) Left breast cancer, stage IA, pT1b N0 ER/PR positive, HER2 negative. Oncotype Dx was not sent due to patient being a poor candidate for chemotherapy due to multiple medical problems.She is not interested in chemotherapy as well Rationale of using aromatase inhibitor -Arimidex  discussed with patient.  Side effects of Arimidex including but not limited to hot flush, joint pain, fatigue, mood swing, osteoporosis discussed with patient. Patient voices understanding and willing to proceed after she checks with her cardiologist.  Recommend DEXA, patient would like to defer at this point. Recommend calcium and vitamin D supplementation  Chronic anticoagulation Patient is on Coumadin for cardiology etiology. History of GI bleeding due to anticoagulation pain Iron panel appears stable.  Atypical lobular hyperplasia [facility]) of breast Patient is on aromatase inhibitor.    Orders Placed This Encounter  Procedures   CBC with Differential/Platelet    Standing Status:   Future    Standing Expiration Date:   05/27/2023   CBC with Differential/Platelet    Standing Status:   Future    Standing  Expiration Date:   05/27/2023   Comprehensive metabolic panel    Standing Status:   Future    Standing Expiration Date:   05/26/2023   Follow up  Lab cbc in 4 weeks, + retacrit Lab cbc cmp in 8 weeks,MD + retacrit.   All questions were answered. The patient knows to call the clinic with any problems, questions or concerns.  Earlie Server, MD, PhD [facility] Hematology Oncology 05/26/2022    REASON FOR VISIT Follow up forbreast cancer, anemia secondary to chronic kidney disease, chronic anticoagulation  HISTORY OF PRESENTING ILLNESS:  Patient presents for follow-up of breast cancer, anemia secondary to chronic kidney disease, chronic anticoagulation Oncology history summary listed as below. Oncology History  Invasive carcinoma of breast ([place])  02/08/2022 Mammogram   Unilateral diagnostic mammogram /us 1. Suspicious mass left breast 10:30 o'clock. 2. Anterior to this suspicious mass on the true lateral view is an additional small lobular mass which is not visualized on ultrasound.     02/28/2022 Initial Diagnosis   Invasive carcinoma of breast ([place])   03/14/2022 Surgery    patient is status post left breast lumpectomy Pathology showed left mammary carcinoma, no special type, 2 sentinel lymph nodes were excised and both were negative.  Margins are negative for invasive carcinoma. pT1b pN0.  Previous biopsy showed ER positive, PR positive HER2 negative   03/31/2022 Cancer Staging   Staging form: Breast, AJCC 8th Edition - Pathologic stage from 03/31/2022: Stage IA (pT1b, pN0, cM0, G1, ER+, PR+, HER2-) - Signed by Earlie Server, MD on 03/31/2022 Stage prefix: Initial diagnosis Multigene prognostic tests performed: None Histologic grading system: 3 grade  system   04/25/2022 - 05/24/2022 Radiation Therapy   S/p adjuvant breast radiation    Genetic Testing   Patient declined genetic testing     INTERVAL HISTORY 71 y.o. female  presents for follow-up for evaluation of breast cancer, anemia  due to chronic kidney disease, chronic anticoagulation. Status post radiation. She feels fatigue is worse.  Review of Systems  Constitutional:  Positive for malaise/fatigue. Negative for chills, fever and weight loss.  HENT:  Negative for sore throat.   Eyes:  Negative for redness.  Respiratory:  Negative for cough, shortness of breath and wheezing.   Cardiovascular:  Negative for chest pain, palpitations and leg swelling.  Gastrointestinal:  Negative for abdominal pain, blood in stool, nausea and vomiting.  Genitourinary:  Negative for dysuria.  Musculoskeletal:  Negative for myalgias.  Skin:  Negative for rash.  Neurological:  Negative for dizziness, tingling and tremors.  Endo/Heme/Allergies:  Does not bruise/bleed easily.  Psychiatric/Behavioral:  Negative for hallucinations.     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 Jude mechanical valve 09/19/2003   Arthritis    Breast cancer, left ([place]) 02/22/2022   a.) CNB 02/22/2022 (+) for invasive mammary carcinoma; stage IA (cT1b, cN0, cM0, G1, ER+, PR+, HER2-).   Cerebrovascular accident (CVA) due to embolism of other precerebral artery (HCC)    CKD (chronic kidney disease) stage 4, GFR 15-29 ml/min (HCC)    Coronary artery disease    a.) LHC 05/07/2015: 50% RPDA, 50% inf septal, 50% mLCx, 50% pLAD, 40% mRCA; intervention deferred opting for medical mgmt.   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 b.) s/p DCCV 2016 and TEE with cardioversion 09/2018. 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   a.) 28 mm Hemashield like graft    SURGICAL HISTORY: Past Surgical History:  Procedure Laterality Date   ABDOMINAL HYSTERECTOMY     AORTIC VALVE REPLACEMENT N/A 09/19/2003   BREAST BIOPSY Right 01/29/2021   [place] Bx, 6:00 vision clip --> sclerosing intraductal papilloma; (-) for atypia and malignancy   BREAST BIOPSY Right 01/29/2021   [place] Bx, 8:00 Ribbon clip --> usual ductal hyperplasia; (-) for atypia and malignancy   CARDIAC CATHETERIZATION N/A 05/07/2015   Procedure: Left Heart Cath;  Surgeon: [name], MD;  Location: [place] [facility];  Service: Cardiovascular;  Laterality: N/A;   COLONOSCOPY Left 02/20/2019   Procedure: COLONOSCOPY;  Surgeon: Virgel Manifold, MD;  Location: [facility];  Service: Endoscopy;  Laterality: Left;   COLONOSCOPY N/A 02/21/2019   Procedure: COLONOSCOPY;  Surgeon: Virgel Manifold, MD;  Location: [facility];  Service: Endoscopy;  Laterality: N/A;   ELECTROPHYSIOLOGIC STUDY N/A 05/05/2015   Procedure: Cardioversion;  Surgeon: [name], MD;  Location: [facility];  Service: Cardiovascular;  Laterality: N/A;   ENTEROSCOPY N/A 02/21/2019   Procedure: ENTEROSCOPY;  Surgeon: Virgel Manifold, MD;  Location: [facility];  Service: Endoscopy;  Laterality: N/A;    ESOPHAGOGASTRODUODENOSCOPY Left 02/19/2019   Procedure: ESOPHAGOGASTRODUODENOSCOPY (EGD);  Surgeon: Virgel Manifold, MD;  Location: [facility] ENDOSCOPY;  Service: Endoscopy;  Laterality: Left;   EXCISION OF BREAST BIOPSY Right 03/01/2021   Procedure: EXCISION OF BREAST BIOPSY w/ radiofrequency tag;  Surgeon: Herbert Pun, MD;  Location: [facility];  Service: General;  Laterality: Right;   PARTIAL MASTECTOMY WITH NEEDLE LOCALIZATION AND AXILLARY SENTINEL LYMPH NODE BX Left 03/14/2022   Procedure: PARTIAL MASTECTOMY WITH Radio Frequency tag AND AXILLARY SENTINEL LYMPH NODE BX;  Surgeon: Herbert Pun, MD;  Location: [facility];  Service: General;  Laterality: Left;   REPAIR OF ACUTE ASCENDING THORACIC AORTIC DISSECTION N/A 06/08/2003   Procedure: ASCENDING AORTIC DISSECTION REPAIR AND RESUSPENSION OF AORTIC VALVE (28 mm Hemashield like graft); Location: [facility]; Surgeon: Druscilla Brownie, MD   REPLACEMENT TOTAL KNEE BILATERAL     TEE WITH CARDIOVERSION  09/26/2018    SOCIAL HISTORY: Social History   Socioeconomic History   Marital status: Married    Spouse name: Not on file   Number of children: Not on file   Years of education: Not on file   Highest education level: Not on file  Occupational History   Not on file  Tobacco Use   Smoking status: Former    Packs/day: 0.50    Years: 20.00    Total pack years: 10.00    Types: Cigarettes    Quit date: 2004    Years since quitting: 19.4   Smokeless tobacco: Never  Vaping Use   Vaping Use: Never used  Substance and Sexual Activity   Alcohol use: Yes    Alcohol/week: 0.0 standard drinks of alcohol    Comment: occas   Drug use: No   Sexual activity: Not on file  Other Topics Concern   Not on file  Social History Narrative   Not on file   Social Determinants of Health   Financial Resource Strain: Not on file  Food Insecurity: Not on file  Transportation Needs: Not on file  Physical Activity: Not on file  Stress: Not on  file  Social Connections: Not on file  Intimate Partner Violence: Not on file    FAMILY HISTORY: Family History  Problem Relation Age of Onset   Hypertension Mother    Colon cancer Mother    Prostate cancer Father    Kidney cancer Brother    Prostate cancer Brother    Prostate cancer Brother    Breast cancer Neg Hx     ALLERGIES:  is allergic to atorvastatin.  MEDICATIONS:  Current Outpatient Medications  Medication Sig Dispense Refill   anastrozole (ARIMIDEX) 1 MG tablet Take 1 tablet (1 mg total) by mouth daily. 30 tablet 3   ALPRAZolam (XANAX) 0.25 MG tablet Take 0.25 mg by mouth 2 (two) times daily.     amLODipine (NORVASC)  10 MG tablet Take 10 mg by mouth daily.      amoxicillin (AMOXIL) 500 MG tablet Take 2,000 mg by mouth See admin instructions. Take 2000 mg by mouth one hour prior to dental procedures     aspirin EC 81 MG tablet Take 81 mg by mouth daily.     benazepril (LOTENSIN) 40 MG tablet Take 40 mg by mouth daily.     cloNIDine (CATAPRES) 0.3 MG tablet Take 0.3 mg by mouth 2 (two) times daily.  2   diphenhydramine-acetaminophen (TYLENOL PM) 25-500 MG TABS tablet Take 1 tablet by mouth at bedtime as needed.     folic acid (FOLVITE) 1 MG tablet TAKE 1 TABLET(1 MG) BY MOUTH DAILY 90 tablet 3   furosemide (LASIX) 20 MG tablet Take 20-40 mg by mouth daily. Take 20 mg by mouth on odd days and 40 mg on even days     hydrALAZINE (APRESOLINE) 100 MG tablet Take 100 mg by mouth 2 (two) times daily.     pantoprazole (PROTONIX) 20 MG tablet Take 1 tablet (20 mg total) by mouth daily. 30 tablet 2   rosuvastatin (CRESTOR) 5 MG tablet Take 5 mg by mouth at bedtime.  2   sodium bicarbonate 650 MG tablet Take 650 mg by mouth 2 (two) times daily.     traMADol (ULTRAM) 50 MG tablet Take 1 tablet (50 mg total) by mouth every 12 (twelve) hours as needed. 12 tablet 0   warfarin (COUMADIN) 4 MG tablet Take 4 mg by mouth See admin instructions. Take 4 mg by mouth daily except Wednesday  Saturday.     warfarin (COUMADIN) 6 MG tablet Take 6 mg by mouth See admin instructions. Take 6 mg by mouth on Wednesday and Saturday.     No current facility-administered medications for this visit.     PHYSICAL EXAMINATION: ECOG PERFORMANCE STATUS: 1 - Symptomatic but completely ambulatory Vitals:   05/26/22 1050  BP: (!) 114/54  Pulse: (!) 57  Temp: (!) 96.4 F (35.8 C)   Filed Weights   05/26/22 1050  Weight: 164 lb (74.4 kg)    Physical Exam Constitutional:      General: She is not in acute distress.    Appearance: She is not diaphoretic.  HENT:     Head: Normocephalic and atraumatic.     Nose: Nose normal.     Mouth/Throat:     Pharynx: No oropharyngeal exudate.  Eyes:     General: No scleral icterus.       Left eye: No discharge.     Pupils: Pupils are equal, round, and reactive to light.  Neck:     Vascular: No JVD.  Cardiovascular:     Rate and Rhythm: Normal rate. Rhythm irregular.     Heart sounds: Murmur heard.  Pulmonary:     Effort: Pulmonary effort is normal. No respiratory distress.     Breath sounds: No wheezing or rales.  Chest:     Chest wall: No tenderness.  Abdominal:     General: Bowel sounds are normal. There is no distension.     Palpations: Abdomen is soft. There is no mass.     Tenderness: There is no abdominal tenderness. There is no rebound.  Musculoskeletal:        General: No tenderness. Normal range of motion.     Cervical back: Normal range of motion and neck supple.     Comments: Trace ankle edema bilaterally  Lymphadenopathy:     Cervical:  No cervical adenopathy.  Skin:    General: Skin is warm and dry.     Findings: No erythema or rash.  Neurological:     Mental Status: She is alert and oriented to person, place, and time.     Cranial Nerves: No cranial nerve deficit.     Motor: No abnormal muscle tone.     Coordination: Coordination normal.  Psychiatric:        Mood and Affect: Mood and affect normal.      LABORATORY DATA:  I have reviewed the data as listed Lab Results  Component Value Date   WBC 3.5 (L) 05/24/2022   HGB 7.0 (L) 05/24/2022   HCT 23.6 (L) 05/24/2022   MCV 95.9 05/24/2022   PLT 169 05/24/2022   Recent Labs    03/14/22 0743 05/09/22 1351  NA 141 140  K 4.2 4.1  CL 107 106  CO2  --  24  GLUCOSE 99 104*  BUN 57* 66*  CREATININE 3.00* 3.00*  CALCIUM  --  9.9  GFRNONAA  --  16*  PROT  --  7.3  ALBUMIN  --  4.0  AST  --  15  ALT  --  11  ALKPHOS  --  76  BILITOT  --  0.5    Iron/TIBC/Ferritin/ %Sat    Component Value Date/Time   IRON 41 05/24/2022 0914   TIBC 339 05/24/2022 0914   FERRITIN 65 05/24/2022 0914   IRONPCTSAT 12 05/24/2022 0914    08/29/2017 Folate 2.8,  B12 825              01/02/2018 TSH 3.05                                                                                                                                                         RADIOGRAPHIC STUDIES: I have personally reviewed the radiological images as listed and agreed with the findings in the report. MM Breast Surgical Specimen  Result Date: 03/14/2022 CLINICAL DATA:  Status post RF ID tag localized LEFT breast lumpectomy. COIL clip marks site of invasive mammary carcinoma. EXAM: SPECIMEN RADIOGRAPH OF THE LEFT BREAST COMPARISON:  Previous exam(s). FINDINGS: Status post excision of the LEFT breast. The RF ID tag and COIL shaped clip are present within the specimen. COIL clip is located between 5 and 6 at row H. IMPRESSION: Specimen radiograph of the LEFT breast. Electronically Signed   By: Valentino Saxon M.D.   On: 03/14/2022 14:27  NM Sentinel Node Inj-No Rpt (Breast)  Result Date: 03/14/2022 Sulfur Colloid was injected by the Nuclear Medicine Technologist for sentinel lymph node localization.   [place] LT RADIO FREQUENCY TAG LOC US GUIDE  Result Date: 03/10/2022 CLINICAL DATA:  Biopsy-proven invasive mammary carcinoma at site of COIL clip in the LEFT breast at  10: 30  EXAM: RF ID tag LOCALIZATION OF THE LEFT BREAST WITH ULTRASOUND GUIDANCE COMPARISON:  Previous exams. FINDINGS: Patient presents for RF ID tag localization prior to lumpectomy. I met with the patient and we discussed the procedure of RF ID tag localization including benefits and alternatives. We discussed the high likelihood of a successful procedure. We discussed the risks of the procedure, including infection, bleeding, tissue injury, and further surgery. Informed, written consent was given. The usual time-out protocol was performed immediately prior to the procedure. A small hematoma was sonographically noted at the site of prior biopsy during initial planning and positioning which obscures the mass. As such, the COIL clip was targeted utilizing ultrasound guidance. Using ultrasound guidance, sterile technique, 1% lidocaine and a 7 cm RF ID tag needle (RF ID tag [id number]), the COIL clip was localized using a medial approach. Postprocedural mammogram was performed. The images were marked for Cintron-Diaz. IMPRESSION: RF ID tag localization of the LEFT breast. No apparent complications. Electronically Signed   By: Valentino Saxon M.D.   On: 03/10/2022 16:39  MM DIAG BREAST TOMO UNI LEFT  Result Date: 03/10/2022 CLINICAL DATA:  Status post ultrasound-guided RF ID tag placement. Patient is status post ultrasound-guided biopsy at 10:30 8 cm from the nipple demonstrating invasive mammary carcinoma. Benign LEFT breast biopsy at the X clip demonstrated fibrocystic change EXAM: DIAGNOSTIC LEFT MAMMOGRAM POST ULTRASOUND-GUIDED RF ID tag PLACEMENT COMPARISON:  Previous exam(s). FINDINGS: Mammographic images were obtained following ultrasound-guided RF ID tag placement (RF ID tag [id number]). These demonstrate the RF ID tag immediately immediately adjacent to the COIL clip at site of biopsy-proven invasive mammary carcinoma. There is a small subcentimeter hematoma at this site from previous biopsy. There is an approximately 2  cm postprocedural hematoma at the site of stereotactic guided biopsy (X clip). IMPRESSION: Appropriate location of the RF ID tag adjacent to the COIL clip at site of biopsy-proven malignancy. Final Assessment: Post Procedure Mammograms for RF ID tag placement Electronically Signed   By: Valentino Saxon M.D.   On: 03/10/2022 16:27

## 2022-05-26 NOTE — Assessment & Plan Note (Addendum)
Left breast cancer, stage IA, pT1b N0 ER/PR positive, HER2 negative. Oncotype Dx was not sent due to patient being a poor candidate for chemotherapy due to multiple medical problems.She is not interested in chemotherapy as well Rationale of using aromatase inhibitor -Arimidex  discussed with patient.  Side effects of Arimidex including but not limited to hot flush, joint pain, fatigue, mood swing, osteoporosis discussed with patient. Patient voices understanding and willing to proceed after she checks with her cardiologist.  Recommend DEXA, patient would like to defer at this point. Recommend calcium and vitamin D supplementation

## 2022-05-26 NOTE — Assessment & Plan Note (Signed)
She is on Retacrit 60,000 units every 8 weeks per patient's preference due to financial cost. Proceed with Retacrit today. Due to low hemoglobin, she is okay to repeat blood work and proceed with Retacrit 60,000 units in 4 weeks.

## 2022-05-26 NOTE — Assessment & Plan Note (Addendum)
Patient is on aromatase inhibitor.   

## 2022-05-26 NOTE — Assessment & Plan Note (Addendum)
Patient is on Coumadin for cardiology etiology. History of GI bleeding due to anticoagulation pain Iron panel appears stable.

## 2022-06-10 ENCOUNTER — Ambulatory Visit
Admission: RE | Admit: 2022-06-10 | Discharge: 2022-06-10 | Disposition: A | Discharge status: Home / Self Care | Payer: Medicare Other | Source: Ambulatory Visit | Attending: Physician Assistant | Admitting: Physician Assistant

## 2022-06-10 DIAGNOSIS — I671 Cerebral aneurysm, nonruptured: Secondary | ICD-10-CM | POA: Insufficient documentation

## 2022-06-10 DIAGNOSIS — Z8673 Personal history of transient ischemic attack (TIA), and cerebral infarction without residual deficits: Secondary | ICD-10-CM | POA: Insufficient documentation

## 2022-06-23 ENCOUNTER — Inpatient Hospital Stay: Payer: Medicare Other | Attending: Oncology

## 2022-06-23 ENCOUNTER — Inpatient Hospital Stay: Payer: Medicare Other

## 2022-06-23 VITALS — BP 104/60 | HR 55

## 2022-06-23 DIAGNOSIS — D631 Anemia in chronic kidney disease: Secondary | ICD-10-CM | POA: Insufficient documentation

## 2022-06-23 DIAGNOSIS — N184 Chronic kidney disease, stage 4 (severe): Secondary | ICD-10-CM | POA: Insufficient documentation

## 2022-06-23 LAB — CBC WITH DIFFERENTIAL/PLATELET
Abs Immature Granulocytes: 0.01 10*3/uL (ref 0.00–0.07)
Basophils Absolute: 0 10*3/uL (ref 0.0–0.1)
Basophils Relative: 1 %
Eosinophils Absolute: 0.1 10*3/uL (ref 0.0–0.5)
Eosinophils Relative: 2 %
HCT: 25.6 % — ABNORMAL LOW (ref 36.0–46.0)
Hemoglobin: 7.6 g/dL — ABNORMAL LOW (ref 12.0–15.0)
Immature Granulocytes: 0 %
Lymphocytes Relative: 14 %
Lymphs Abs: 0.5 10*3/uL — ABNORMAL LOW (ref 0.7–4.0)
MCH: 27.6 pg (ref 26.0–34.0)
MCHC: 29.7 g/dL — ABNORMAL LOW (ref 30.0–36.0)
MCV: 93.1 fL (ref 80.0–100.0)
Monocytes Absolute: 0.3 10*3/uL (ref 0.1–1.0)
Monocytes Relative: 10 %
Neutro Abs: 2.3 10*3/uL (ref 1.7–7.7)
Neutrophils Relative %: 73 %
Platelets: 173 10*3/uL (ref 150–400)
RBC: 2.75 MIL/uL — ABNORMAL LOW (ref 3.87–5.11)
RDW: 15.6 % — ABNORMAL HIGH (ref 11.5–15.5)
WBC: 3.2 10*3/uL — ABNORMAL LOW (ref 4.0–10.5)
nRBC: 0 % (ref 0.0–0.2)

## 2022-06-23 MED ORDER — EPOETIN ALFA-EPBX 20000 UNIT/ML IJ SOLN: 60000.0000 [IU] | Freq: Once | INTRAMUSCULAR | Status: DC

## 2022-06-23 MED ORDER — EPOETIN ALFA-EPBX 10000 UNIT/ML IJ SOLN
20000.0000 [IU] | Freq: Once | INTRAMUSCULAR | Status: AC
  Administered 2022-06-23: 20000 [IU] via SUBCUTANEOUS
  Filled 2022-06-23: qty 2

## 2022-06-23 MED ORDER — EPOETIN ALFA-EPBX 40000 UNIT/ML IJ SOLN
40000.0000 [IU] | Freq: Once | INTRAMUSCULAR | Status: AC
  Administered 2022-06-23: 40000 [IU] via SUBCUTANEOUS
  Filled 2022-06-23: qty 1

## 2022-06-27 ENCOUNTER — Ambulatory Visit: Payer: Medicare Other | Admitting: Radiation Oncology

## 2022-07-07 ENCOUNTER — Ambulatory Visit
Admission: RE | Admit: 2022-07-07 | Discharge: 2022-07-07 | Disposition: A | Discharge status: Home / Self Care | Payer: Medicare Other | Source: Ambulatory Visit | Attending: Radiation Oncology | Admitting: Radiation Oncology

## 2022-07-07 ENCOUNTER — Encounter: Payer: Self-pay | Admitting: Radiation Oncology

## 2022-07-07 VITALS — BP 109/58 | HR 58 | Temp 97.4°F | Resp 16 | Ht 61.0 in | Wt 162.0 lb

## 2022-07-07 DIAGNOSIS — Z79811 Long term (current) use of aromatase inhibitors: Secondary | ICD-10-CM | POA: Insufficient documentation

## 2022-07-07 DIAGNOSIS — C50919 Malignant neoplasm of unspecified site of unspecified female breast: Secondary | ICD-10-CM

## 2022-07-07 DIAGNOSIS — C50912 Malignant neoplasm of unspecified site of left female breast: Secondary | ICD-10-CM | POA: Diagnosis present

## 2022-07-07 DIAGNOSIS — Z170 Estrogen receptor positive status [ER+]: Secondary | ICD-10-CM | POA: Insufficient documentation

## 2022-07-07 DIAGNOSIS — Z923 Personal history of irradiation: Secondary | ICD-10-CM | POA: Insufficient documentation

## 2022-07-07 NOTE — Progress Notes (Signed)
Radiation Oncology Follow up Note  Name: Jeanette Romero   Date:   07/07/2022 MRN:  [id number] DOB: Dec 04, 1951    This 71 y.o. female presents to the clinic today for 1 month follow-up status post whole breast radiation to her left breast for stage Ia (T1b N0 M0) ER/PR positive invasive mammary carcinoma.  REFERRING PROVIDER: Maryland Pink, MD  HPI: Patient is a 71 year old female now at 1 month having completed whole breast radiation to her left breast for stage Ia ER/PR positive invasive mammary carcinoma.  Seen today in routine follow-up she is doing well she specifically denies breast tenderness cough or bone pain..  She has been started on Arimidex tolerating that well without side effect.  She has been evaluated by cardiology and may need cardiac surgery in the near future.  COMPLICATIONS OF TREATMENT: none  FOLLOW UP COMPLIANCE: keeps appointments   PHYSICAL EXAM:  BP (!) 109/58 (BP Location: Left Arm, Patient Position: Sitting, Cuff Size: Normal)   Pulse (!) 58   Temp (!) 97.4 F (36.3 C) (Tympanic)   Resp 16   Ht '5\' 1"'$  (1.549 m) Comment: stated ht  Wt 162 lb (73.5 kg)   BMI 30.61 kg/m  Lungs are clear to A&P cardiac examination essentially unremarkable with regular rate and rhythm. No dominant mass or nodularity is noted in either breast in 2 positions examined. Incision is well-healed. No axillary or supraclavicular adenopathy is appreciated. Cosmetic result is excellent.  Breast is still hyperpigmented.  Well-developed well-nourished patient in NAD. HEENT reveals PERLA, EOMI, discs not visualized.  Oral cavity is clear. No oral mucosal lesions are identified. Neck is clear without evidence of cervical or supraclavicular adenopathy. Lungs are clear to A&P. Cardiac examination is essentially unremarkable with regular rate and rhythm without murmur rub or thrill. Abdomen is benign with no organomegaly or masses noted. Motor sensory and DTR levels are equal and symmetric in the  upper and lower extremities. Cranial nerves II through XII are grossly intact. Proprioception is intact. No peripheral adenopathy or edema is identified. No motor or sensory levels are noted. Crude visual fields are within normal range.  RADIOLOGY RESULTS: No current films for review  PLAN: Present time patient is recovering well from her radiation therapy treatments.  And pleased with her overall progress.  I have assured her the hyperpigmentation will diminish over time.  Of asked to see her back in 5 months for follow-up.  Patient and family know to call with any concerns.  I would like to take this opportunity to thank you for allowing me to participate in the care of your patient.Noreene Filbert, MD

## 2022-07-11 ENCOUNTER — Other Ambulatory Visit: Payer: Self-pay | Admitting: Oncology

## 2022-07-12 ENCOUNTER — Encounter: Payer: Self-pay | Admitting: Oncology

## 2022-07-21 ENCOUNTER — Inpatient Hospital Stay: Payer: Medicare Other | Attending: Oncology

## 2022-07-21 ENCOUNTER — Inpatient Hospital Stay: Payer: Medicare Other | Admitting: Oncology

## 2022-07-21 ENCOUNTER — Inpatient Hospital Stay: Payer: Medicare Other

## 2022-08-28 ENCOUNTER — Ambulatory Visit
Admission: EM | Admit: 2022-08-28 | Discharge: 2022-08-28 | Disposition: A | Discharge status: Home / Self Care | Payer: Medicare Other | Attending: Physician Assistant | Admitting: Physician Assistant

## 2022-08-28 DIAGNOSIS — R000 Tachycardia, unspecified: Secondary | ICD-10-CM | POA: Diagnosis not present

## 2022-08-28 DIAGNOSIS — I4891 Unspecified atrial fibrillation: Secondary | ICD-10-CM

## 2022-08-28 DIAGNOSIS — I509 Heart failure, unspecified: Secondary | ICD-10-CM | POA: Diagnosis not present

## 2022-08-28 DIAGNOSIS — R0603 Acute respiratory distress: Secondary | ICD-10-CM

## 2022-08-28 NOTE — ED Triage Notes (Addendum)
Pt c/o SOB x2days.  Pt denies any cough, congestion, light headedness or dizziness.   Pt was in the hospital 3 weeks ago and had her fluid pill changed. - Furosemide. Pt went from 2 every other day to 1 daily.   Pt is worried about fluid around her lungs.   Pt states that she does not currently have SOB and last had it when getting out of the car.   O2 ranged from 95-93 in room with patient movement.   Pt states that she has had SOB before but is mostly concerned with fluid around her lungs due to recent hospitalization and medication change.

## 2022-08-28 NOTE — ED Notes (Signed)
Patient is being discharged from the Urgent Care and sent to the [facility] via private vehicle with family member . Per Laurene Footman, PA, patient is in need of higher level of care due to SOB. Patient is aware and verbalizes understanding of plan of care.  Vitals:   08/28/22 0929  BP: (!) 157/77  Pulse: (!) 111  Resp: 18  Temp: 98.5 F (36.9 C)  SpO2: 93%

## 2022-08-28 NOTE — Discharge Instructions (Signed)

## 2022-08-28 NOTE — ED Provider Notes (Signed)
[facility]    CSN: [id number] Arrival date & time: 08/28/22  0914      History   Chief Complaint Chief Complaint  Patient presents with   Shortness of Breath    HPI Jeanette Romero is a 71 y.o. female who presents with a relative for shortness of breath since yesterday which has gotten worse today.  Reports shortness of breath at rest but definitely increased shortness of breath when she is active, up and moving.  She also reports that she has had cough but no congestion or fever.  She has had increased swelling of her lower extremities.  She feels palpitations but denies any chest pain.  Past medical history significant for heart failure, stage IV CKD, long-term use of warfarin, hypertension, hyperlipidemia, permanent atrial fibrillation, AAA with recent aortic dissection less than 2 months ago, left-sided breast cancer (diagnosed this year), history of CVA, peripheral vascular disease, and sleep apnea.  At this time, patient states that she is concerned about fluid buildup in her lungs.  She does take Lasix for the swelling.  Patient follows up with [facility].  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 Jude mechanical valve 09/19/2003   Arthritis    Breast cancer, left ([place]) 02/22/2022   a.) CNB 02/22/2022 (+) for invasive mammary carcinoma; stage IA (cT1b, cN0, cM0, G1, ER+, PR+, HER2-).   Cerebrovascular accident (CVA) due to embolism of other precerebral artery (HCC)    CKD (chronic kidney disease) stage 4, GFR 15-29 ml/min (HCC)    Coronary artery disease     a.) LHC 05/07/2015: 50% RPDA, 50% inf septal, 50% mLCx, 50% pLAD, 40% mRCA; intervention deferred opting for medical mgmt.   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 b.) s/p DCCV 2016 and TEE with cardioversion 09/2018. 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   a.) 28 mm Hemashield like graft    Patient Active Problem List   Diagnosis Date Noted   Atypical lobular hyperplasia ([place]) of breast 02/28/2022   Invasive carcinoma of breast ([place]) 02/28/2022   Goals of care, counseling/discussion 02/28/2022   Benign neoplasm of ascending colon    Benign neoplasm of cecum    Internal hemorrhoids    Elevated troponin    Columnar-lined esophagus  Melena    Acute posthemorrhagic anemia    Demand ischemia of myocardium (HCC)    Symptomatic anemia 02/17/2019   Anemia of chronic renal failure 11/14/2017   Normocytic anemia 10/31/2017   Fatigue associated with anemia 10/31/2017   Depression 10/31/2017   Hyperthyroidism 10/31/2017   Chronic anticoagulation 10/31/2017   Bradycardia 05/05/2015    Past Surgical History:  Procedure Laterality Date   ABDOMINAL HYSTERECTOMY     AORTIC VALVE REPLACEMENT N/A 09/19/2003   BREAST BIOPSY Right 01/29/2021   [place] Bx, 6:00 vision clip --> sclerosing intraductal papilloma; (-) for atypia  and malignancy   BREAST BIOPSY Right 01/29/2021   [place] Bx, 8:00 Ribbon clip --> usual ductal hyperplasia; (-) for atypia and malignancy   CARDIAC CATHETERIZATION N/A 05/07/2015   Procedure: Left Heart Cath;  Surgeon: [name], MD;  Location: [facility];  Service: Cardiovascular;  Laterality: N/A;   COLONOSCOPY Left 02/20/2019   Procedure: COLONOSCOPY;  Surgeon: Virgel Manifold, MD;  Location: [facility];  Service: Endoscopy;  Laterality: Left;   COLONOSCOPY N/A 02/21/2019   Procedure: COLONOSCOPY;  Surgeon: Virgel Manifold, MD;  Location: [facility];  Service: Endoscopy;  Laterality: N/A;   ELECTROPHYSIOLOGIC STUDY N/A 05/05/2015   Procedure: Cardioversion;  Surgeon: [name], MD;  Location: [facility];  Service: Cardiovascular;  Laterality: N/A;   ENTEROSCOPY N/A 02/21/2019   Procedure: ENTEROSCOPY;  Surgeon: Virgel Manifold, MD;  Location: [facility];  Service: Endoscopy;  Laterality: N/A;   ESOPHAGOGASTRODUODENOSCOPY Left 02/19/2019   Procedure: ESOPHAGOGASTRODUODENOSCOPY (EGD);  Surgeon: Virgel Manifold, MD;  Location[facility];  Service: Endoscopy;  Laterality: Left;   EXCISION OF BREAST BIOPSY Right 03/01/2021   Procedure: EXCISION OF BREAST BIOPSY w/ radiofrequency tag;  Surgeon: Herbert Pun, MD;  Location: [facility];  Service: General;  Laterality: Right;   PARTIAL MASTECTOMY WITH NEEDLE LOCALIZATION AND AXILLARY SENTINEL LYMPH NODE BX Left 03/14/2022   Procedure: PARTIAL MASTECTOMY WITH Radio Frequency tag AND AXILLARY SENTINEL LYMPH NODE BX;  Surgeon: Herbert Pun, MD;  Location: [facility];  Service: General;  Laterality: Left;   REPAIR OF ACUTE ASCENDING THORACIC AORTIC DISSECTION N/A 06/08/2003   Procedure: ASCENDING AORTIC DISSECTION REPAIR AND RESUSPENSION OF AORTIC VALVE (28 mm Hemashield like graft); Location: [facility]; Surgeon: Druscilla Brownie, MD   REPLACEMENT TOTAL KNEE BILATERAL     TEE WITH CARDIOVERSION   09/26/2018    OB History   No obstetric history on file.      Home Medications    Prior to Admission medications   Medication Sig Start Date End Date Taking? Authorizing Provider  ALPRAZolam (XANAX) 0.25 MG tablet Take 0.25 mg by mouth 2 (two) times daily.   Yes [provider]  amLODipine (NORVASC) 10 MG tablet Take 10 mg by mouth daily.    Yes [provider]  anastrozole (ARIMIDEX) 1 MG tablet Take 1 tablet (1 mg total) by mouth daily. 05/26/22  Yes Earlie Server, MD  aspirin EC 81 MG tablet Take 81 mg by mouth daily.   Yes [provider]  benazepril (LOTENSIN) 40 MG tablet Take 40 mg by mouth daily.   Yes [provider]  folic acid (FOLVITE) 1 MG tablet TAKE 1 TABLET BY MOUTH DAILY 07/12/22  Yes Earlie Server, MD  furosemide (LASIX) 20 MG tablet Take 20-40 mg by mouth daily. Take 20 mg by mouth on odd days and 40 mg on even days 12/14/18  Yes [provider]  pantoprazole (PROTONIX) 20  MG tablet Take 1 tablet (20 mg total) by mouth daily. 02/23/19  Yes Gladstone Lighter, MD  rosuvastatin (CRESTOR) 5 MG tablet Take 5 mg by mouth at bedtime. 02/20/18  Yes [provider]  sodium bicarbonate 650 MG tablet Take 650 mg by mouth 2 (two) times daily. 05/18/20  Yes [provider]  warfarin (COUMADIN) 4 MG tablet Take 4 mg by mouth See admin instructions. Take 4 mg by mouth daily except Wednesday Saturday. 01/13/19  Yes [provider]  warfarin (COUMADIN) 6 MG tablet Take 6 mg by mouth See admin instructions. Take 6 mg by mouth on Wednesday and Saturday. 05/21/19  Yes [provider]  amoxicillin (AMOXIL) 500 MG tablet Take 2,000 mg by mouth See admin instructions. Take 2000 mg by mouth one hour prior to dental procedures 01/17/19   [provider]  cloNIDine (CATAPRES) 0.3 MG tablet Take 0.3 mg by mouth 2 (two) times daily. 10/14/17   [provider]  diphenhydramine-acetaminophen (TYLENOL PM) 25-500 MG TABS tablet  Take 1 tablet by mouth at bedtime as needed.    [provider]  hydrALAZINE (APRESOLINE) 100 MG tablet Take 100 mg by mouth 2 (two) times daily. 02/27/20   [provider]  traMADol (ULTRAM) 50 MG tablet Take 1 tablet (50 mg total) by mouth every 12 (twelve) hours as needed. 02/25/20   Sable Feil, PA-C    Family History Family History  Problem Relation Age of Onset   Hypertension Mother    Colon cancer Mother    Prostate cancer Father    Kidney cancer Brother    Prostate cancer Brother    Prostate cancer Brother    Breast cancer Neg Hx     Social History Social History   Tobacco Use   Smoking status: Former    Packs/day: 0.50    Years: 20.00    Total pack years: 10.00    Types: Cigarettes    Quit date: 2004    Years since quitting: 19.7   Smokeless tobacco: Never  Vaping Use   Vaping Use: Never used  Substance Use Topics   Alcohol use: Yes    Alcohol/week: 0.0 standard drinks of alcohol    Comment: occas   Drug use: No     Allergies   Atorvastatin   Review of Systems Review of Systems  Constitutional:  Positive for fatigue. Negative for diaphoresis and fever.  HENT:  Negative for congestion and rhinorrhea.   Respiratory:  Positive for cough and shortness of breath. Negative for chest tightness.   Cardiovascular:  Positive for palpitations and leg swelling. Negative for chest pain.  Gastrointestinal:  Negative for abdominal pain, diarrhea, nausea and vomiting.  Musculoskeletal:  Negative for myalgias.  Neurological:  Positive for weakness. Negative for dizziness and headaches.     Physical Exam Triage Vital Signs ED Triage Vitals  Enc Vitals Group     BP      Pulse      Resp      Temp      Temp src      SpO2      Weight      Height      Head Circumference      Peak Flow      Pain Score      Pain Loc      Pain Edu?      Excl. in [place]?    No data found.  Updated Vital Signs BP (!) 157/77 (BP  Location: Right Arm)   Pulse  (!) 111   Temp 98.5 F (36.9 C) (Oral)   Resp 18   Ht 5' (1.524 m)   Wt 154 lb (69.9 kg)   SpO2 93%   BMI 30.08 kg/m    Physical Exam Vitals and nursing note reviewed.  Constitutional:      General: She is in acute distress (mild).     Appearance: Normal appearance. She is not ill-appearing or toxic-appearing.  HENT:     Head: Normocephalic and atraumatic.     Nose: Nose normal.     Mouth/Throat:     Mouth: Mucous membranes are moist.     Pharynx: Oropharynx is clear.  Eyes:     General: No scleral icterus.       Right eye: No discharge.        Left eye: No discharge.     Conjunctiva/sclera: Conjunctivae normal.  Cardiovascular:     Rate and Rhythm: Tachycardia present. Rhythm irregular.     Heart sounds: Normal heart sounds.  Pulmonary:     Effort: Respiratory distress (mild, increased RR when moving around) present.     Breath sounds: Normal breath sounds.  Abdominal:     Palpations: Abdomen is soft.     Tenderness: There is no abdominal tenderness.  Musculoskeletal:     Cervical back: Neck supple.     Right lower leg: Edema present.     Left lower leg: Edema present.  Skin:    General: Skin is dry.  Neurological:     General: No focal deficit present.     Mental Status: She is alert. Mental status is at baseline.     Motor: No weakness.     Gait: Gait normal.  Psychiatric:        Mood and Affect: Mood normal.        Behavior: Behavior normal.        Thought Content: Thought content normal.      UC Treatments / Results  Labs (all labs ordered are listed, but only abnormal results are displayed) Labs Reviewed - No data to display  EKG   Radiology No results found.  Procedures ED EKG  Date/Time: 08/28/2022 10:20 AM  Performed by: Danton Clap, PA-C Authorized by: Danton Clap, PA-C   Previous ECG:    Previous ECG:  Compared to current   Similarity:  Changes noted   Comparison ECG info:  St & T wave abnormality not seen on EKG from  02/2022 Interpretation:    Interpretation: abnormal   Rate:    ECG rate:  94   ECG rate assessment: tachycardic   Rhythm:    Rhythm: atrial fibrillation   Ectopy:    Ectopy: aberrant   QRS:    QRS axis:  Normal   QRS intervals:  Wide   QRS conduction: normal   ST segments:    ST segments:  Non-specific T waves:    T waves: non-specific   Other findings:    Other findings: LVH   Comments:     Atrial fibrillation with premature aberrantly conducted complexes, LVH, QRS widening, ST and T wave abnormality  (including critical care time)  Medications Ordered in UC Medications - No data to display  Initial Impression / Assessment and Plan / UC Course  I have reviewed the triage vital signs and the nursing notes.  Pertinent labs & imaging results that were available during my care of the patient were reviewed by me  and considered in my medical decision making (see chart for details).   71 year old female with history of CHF, stage IV CKD, long-term use of anticoagulants, hypertension, hyperlipidemia, permanent A-fib, AAA with recent aortic dissection 2 months ago, left-sided breast cancer diagnosed this year, CVA, PVD and sleep apnea.  Patient presents today for shortness of breath over the last couple days which worsened today.  Also reporting fatigue and weakness.  Denying any chest pain.  BP elevated at 157/77.  Pulse elevated at 111.  Oxygen saturation initially 93% in triage but will we have patient lay back for EKG it drops to 87%.  She is also in mild distress when changing positions and appears to be breathing harder/heavier.  Increased edema of lower extremities.  1+ pitting edema of ankles.  Chest is clear to auscultation.  EKG performed today shows atrial fibrillation with premature aberrantly conducted complexes, LVH and ST and T wave abnormality.  The ST and T wave abnormality was not present in March of this year.  Advised patient that since her oxygen is low and she has  increased swelling in her lower extremities I think that she may be having CHF exacerbation.  Also concerned about the ST and T wave abnormality on her EKG that was not present earlier this year.  Advised emergency department.  Have advised to go by EMS but she is adamant that she does not want to go by EMS because she does not want to go to [facility] and she wants to follow-up at [facility] because that is where she normally receives care.  Patient's family member is with her and states they can take her to ER.  I advised that if symptoms acutely worsen while in route they pull over and call EMS.  They agree.  Patient is leaving in mostly stable condition at this time.  Suspect patient will likely be admitted.  Final Clinical Impressions(s) / UC Diagnoses   Final diagnoses:  Respiratory distress  Tachycardia  Atrial fibrillation, unspecified type (HCC)  Congestive heart failure, unspecified HF chronicity, unspecified heart failure type [facility])     Discharge Instructions      You have been advised to follow up immediately in the emergency department for concerning signs.symptoms. If you declined EMS transport, please have a family member take you directly to the ED at this time. Do not delay. Based on concerns about condition, if you do not follow up in th e ED, you may risk poor outcomes including worsening of condition, delayed treatment and potentially life threatening issues. If you have declined to go to the ED at this time, you should call your PCP immediately to set up a follow up appointment.  Go to ED for red flag symptoms, including; fevers you cannot reduce with Tylenol/Motrin, severe headaches, vision changes, numbness/weakness in part of the body, lethargy, confusion, intractable vomiting, severe dehydration, chest pain, breathing difficulty, severe persistent abdominal or pelvic pain, signs of severe infection (increased redness, swelling of an area), feeling faint or passing out, dizziness, etc.  You should especially go to the ED for sudden acute worsening of condition if you do not elect to go at this time.      ED Prescriptions   None    PDMP not reviewed this encounter.   Danton Clap, PA-C 08/28/22 [id number]

## 2022-09-01 ENCOUNTER — Other Ambulatory Visit: Payer: Self-pay | Admitting: Oncology

## 2022-09-16 ENCOUNTER — Telehealth: Payer: Self-pay | Admitting: Oncology

## 2022-09-16 NOTE — Telephone Encounter (Signed)
Pt daughter, Jeanette Romero, called and stated pt has missed her appt in August. She was in the hospital, and is being monitored by [facility] since d/c from hospital. Dr at [facility] advised them to reach out to [place] for pt to get injection and iron infusion due to her recent lab work. Please advise.

## 2022-09-19 ENCOUNTER — Encounter: Payer: Self-pay | Admitting: Oncology

## 2022-09-19 NOTE — Telephone Encounter (Signed)
Please schedule pt for Lab/MD/ venofer or retacrit, this week or next, per pt availability.

## 2022-09-23 ENCOUNTER — Other Ambulatory Visit: Payer: Self-pay

## 2022-09-23 DIAGNOSIS — D631 Anemia in chronic kidney disease: Secondary | ICD-10-CM

## 2022-09-27 MED FILL — Iron Sucrose Inj 20 MG/ML (Fe Equiv): INTRAVENOUS | Qty: 10 | Status: AC

## 2022-09-28 ENCOUNTER — Encounter: Payer: Self-pay | Admitting: Oncology

## 2022-09-28 ENCOUNTER — Inpatient Hospital Stay: Payer: Medicare Other | Attending: Oncology

## 2022-09-28 ENCOUNTER — Inpatient Hospital Stay: Payer: Medicare Other

## 2022-09-28 ENCOUNTER — Inpatient Hospital Stay (HOSPITAL_BASED_OUTPATIENT_CLINIC_OR_DEPARTMENT_OTHER): Payer: Medicare Other | Admitting: Oncology

## 2022-09-28 VITALS — BP 158/83 | HR 80 | Temp 96.7°F | Resp 18 | Wt 152.0 lb

## 2022-09-28 DIAGNOSIS — D631 Anemia in chronic kidney disease: Secondary | ICD-10-CM | POA: Diagnosis present

## 2022-09-28 DIAGNOSIS — I5032 Chronic diastolic (congestive) heart failure: Secondary | ICD-10-CM | POA: Insufficient documentation

## 2022-09-28 DIAGNOSIS — Z7901 Long term (current) use of anticoagulants: Secondary | ICD-10-CM | POA: Diagnosis not present

## 2022-09-28 DIAGNOSIS — Z800 Family history of malignant neoplasm of digestive organs: Secondary | ICD-10-CM | POA: Diagnosis not present

## 2022-09-28 DIAGNOSIS — Z8051 Family history of malignant neoplasm of kidney: Secondary | ICD-10-CM | POA: Insufficient documentation

## 2022-09-28 DIAGNOSIS — Z79899 Other long term (current) drug therapy: Secondary | ICD-10-CM | POA: Diagnosis not present

## 2022-09-28 DIAGNOSIS — I4821 Permanent atrial fibrillation: Secondary | ICD-10-CM | POA: Diagnosis not present

## 2022-09-28 DIAGNOSIS — Z8042 Family history of malignant neoplasm of prostate: Secondary | ICD-10-CM | POA: Diagnosis not present

## 2022-09-28 DIAGNOSIS — C50919 Malignant neoplasm of unspecified site of unspecified female breast: Secondary | ICD-10-CM

## 2022-09-28 DIAGNOSIS — N184 Chronic kidney disease, stage 4 (severe): Secondary | ICD-10-CM | POA: Diagnosis present

## 2022-09-28 DIAGNOSIS — Z170 Estrogen receptor positive status [ER+]: Secondary | ICD-10-CM | POA: Diagnosis not present

## 2022-09-28 DIAGNOSIS — N6099 Unspecified benign mammary dysplasia of unspecified breast: Secondary | ICD-10-CM | POA: Diagnosis not present

## 2022-09-28 DIAGNOSIS — Z923 Personal history of irradiation: Secondary | ICD-10-CM | POA: Diagnosis not present

## 2022-09-28 DIAGNOSIS — C50212 Malignant neoplasm of upper-inner quadrant of left female breast: Secondary | ICD-10-CM | POA: Insufficient documentation

## 2022-09-28 DIAGNOSIS — Z87891 Personal history of nicotine dependence: Secondary | ICD-10-CM | POA: Insufficient documentation

## 2022-09-28 DIAGNOSIS — Z79811 Long term (current) use of aromatase inhibitors: Secondary | ICD-10-CM | POA: Insufficient documentation

## 2022-09-28 DIAGNOSIS — I130 Hypertensive heart and chronic kidney disease with heart failure and stage 1 through stage 4 chronic kidney disease, or unspecified chronic kidney disease: Secondary | ICD-10-CM | POA: Insufficient documentation

## 2022-09-28 LAB — COMPREHENSIVE METABOLIC PANEL
ALT: 18 U/L (ref 0–44)
AST: 16 U/L (ref 15–41)
Albumin: 3.7 g/dL (ref 3.5–5.0)
Alkaline Phosphatase: 126 U/L (ref 38–126)
Anion gap: 7 (ref 5–15)
BUN: 32 mg/dL — ABNORMAL HIGH (ref 8–23)
CO2: 25 mmol/L (ref 22–32)
Calcium: 9.7 mg/dL (ref 8.9–10.3)
Chloride: 110 mmol/L (ref 98–111)
Creatinine, Ser: 1.86 mg/dL — ABNORMAL HIGH (ref 0.44–1.00)
GFR, Estimated: 29 mL/min — ABNORMAL LOW (ref >60)
Glucose, Bld: 92 mg/dL (ref 70–99)
Potassium: 3.7 mmol/L (ref 3.5–5.1)
Sodium: 142 mmol/L (ref 135–145)
Total Bilirubin: 0.5 mg/dL (ref 0.3–1.2)
Total Protein: 7 g/dL (ref 6.5–8.1)

## 2022-09-28 LAB — CBC WITH DIFFERENTIAL/PLATELET
Abs Immature Granulocytes: 0.02 10*3/uL (ref 0.00–0.07)
Basophils Absolute: 0 10*3/uL (ref 0.0–0.1)
Basophils Relative: 1 %
Eosinophils Absolute: 0.1 10*3/uL (ref 0.0–0.5)
Eosinophils Relative: 2 %
HCT: 28.4 % — ABNORMAL LOW (ref 36.0–46.0)
Hemoglobin: 8.1 g/dL — ABNORMAL LOW (ref 12.0–15.0)
Immature Granulocytes: 0 %
Lymphocytes Relative: 10 %
Lymphs Abs: 0.5 10*3/uL — ABNORMAL LOW (ref 0.7–4.0)
MCH: 27.5 pg (ref 26.0–34.0)
MCHC: 28.5 g/dL — ABNORMAL LOW (ref 30.0–36.0)
MCV: 96.3 fL (ref 80.0–100.0)
Monocytes Absolute: 0.3 10*3/uL (ref 0.1–1.0)
Monocytes Relative: 6 %
Neutro Abs: 4.5 10*3/uL (ref 1.7–7.7)
Neutrophils Relative %: 81 %
Platelets: 168 10*3/uL (ref 150–400)
RBC: 2.95 MIL/uL — ABNORMAL LOW (ref 3.87–5.11)
RDW: 18.9 % — ABNORMAL HIGH (ref 11.5–15.5)
WBC: 5.5 10*3/uL (ref 4.0–10.5)
nRBC: 0 % (ref 0.0–0.2)

## 2022-09-28 MED ORDER — EPOETIN ALFA 20000 UNIT/ML IJ SOLN
20000.0000 [IU] | INTRAMUSCULAR | Status: DC
  Administered 2022-09-28: 20000 [IU] via SUBCUTANEOUS
  Filled 2022-09-28: qty 1

## 2022-09-28 MED ORDER — EPOETIN ALFA 20000 UNIT/ML IJ SOLN: 60000.0000 [IU] | Freq: Once | INTRAMUSCULAR | Status: DC

## 2022-09-28 MED ORDER — EPOETIN ALFA 40000 UNIT/ML IJ SOLN
40000.0000 [IU] | Freq: Once | INTRAMUSCULAR | Status: AC
  Administered 2022-09-28: 40000 [IU] via SUBCUTANEOUS
  Filled 2022-09-28: qty 1

## 2022-09-28 NOTE — Assessment & Plan Note (Addendum)
Left breast cancer, stage IA, pT1b N0 ER/PR positive, HER2 negative. Oncotype Dx was not sent due to patient being a poor candidate for chemotherapy due to multiple medical problems.She is not interested in chemotherapy as well Patient tolerates Arimidex. Recommend patient to continue. Recommend DEXA, patient would like to defer at this point. Recommend calcium and vitamin D supplementation  Obtain annual diagnostic mammogram-February 2024

## 2022-09-28 NOTE — Assessment & Plan Note (Signed)
Patient is on aromatase inhibitor.   

## 2022-09-28 NOTE — Assessment & Plan Note (Signed)
Labs reviewed and discussed with patient. Patient agrees with proceeding erythropoietin replacement therapy Retacrit 60,000 units every 4 weeks.  We discussed about possibility of increased Retacrit frequency if needed.  I recommend patient to proceed with IV Venofer weekly x3 to further increase her iron stores.  She agrees.

## 2022-09-28 NOTE — Progress Notes (Signed)
Hematology/Oncology Progress note [phone] [phone]      Patient Care Team: [place] Pink, MD as PCP - General (Family Medicine) Earlie Server, MD as Consulting Physician (Hematology and Oncology) Theodore Demark, RN (Inactive) as Oncology Nurse Navigator  ASSESSMENT & PLAN:   Cancer Staging  Invasive carcinoma of breast [facility]) Staging form: Breast, AJCC 8th Edition - Pathologic stage from 03/31/2022: Stage IA (pT1b, pN0, cM0, G1, ER+, PR+, HER2-) - Signed by Earlie Server, MD on 03/31/2022   Invasive carcinoma of breast ([place]) Left breast cancer, stage IA, pT1b N0 ER/PR positive, HER2 negative. Oncotype Dx was not sent due to patient being a poor candidate for chemotherapy due to multiple medical problems.She is not interested in chemotherapy as well Patient tolerates Arimidex. Recommend patient to continue. Recommend DEXA, patient would like to defer at this point. Recommend calcium and vitamin D supplementation  Obtain annual diagnostic mammogram-February 2024  Atypical lobular hyperplasia [facility]) of breast Patient is on aromatase inhibitor.    Anemia of chronic renal failure Labs reviewed and discussed with patient. Patient agrees with proceeding erythropoietin replacement therapy Retacrit 60,000 units every 4 weeks.  We discussed about possibility of increased Retacrit frequency if needed.  I recommend patient to proceed with IV Venofer weekly x3 to further increase her iron stores.  She agrees.  Orders Placed This Encounter  Procedures   MM DIAG BREAST TOMO BILATERAL    Standing Status:   Future    Standing Expiration Date:   09/29/2023    Order Specific Question:   Reason for Exam (SYMPTOM  OR DIAGNOSIS REQUIRED)    Answer:   history of breast cancer    Order Specific Question:   Preferred imaging location?    Answer:   [facility]   US BREAST LTD UNI LEFT INC AXILLA    Standing Status:   Future    Standing Expiration Date:   09/29/2023     Order Specific Question:   Reason for Exam (SYMPTOM  OR DIAGNOSIS REQUIRED)    Answer:   history of breast cancer    Order Specific Question:   Preferred imaging location?    Answer:   [facility]   US BREAST LTD UNI RIGHT INC AXILLA    Standing Status:   Future    Standing Expiration Date:   09/29/2023    Order Specific Question:   Reason for Exam (SYMPTOM  OR DIAGNOSIS REQUIRED)    Answer:   history of breast cancer    Order Specific Question:   Preferred imaging location?    Answer:   [facility]   Hemoglobin and hematocrit, blood    Standing Status:   Standing    Number of Occurrences:   2    Standing Expiration Date:   09/29/2023   Iron and TIBC    Standing Status:   Future    Standing Expiration Date:   09/29/2023   Ferritin    Standing Status:   Future    Standing Expiration Date:   09/29/2023   CBC with Differential/Platelet    Standing Status:   Future    Standing Expiration Date:   09/29/2023   Follow up   IV Venofer weekly x 3 H&H in 4 weeks, 8 weeks +/- Retacrit.  12 weeks labs prior to MD +/- Retacrit.  Bilateral diagnostic mammogram in Feb 2024  All questions were answered. The patient knows to call the clinic with any problems, questions or concerns.  Earlie Server, MD, PhD  [facility] 09/28/2022    REASON FOR VISIT Follow up forbreast cancer, anemia secondary to chronic kidney disease, chronic anticoagulation  HISTORY OF PRESENTING ILLNESS:  Patient presents for follow-up of breast cancer, anemia secondary to chronic kidney disease, chronic anticoagulation Oncology history summary listed as below. Oncology History  Invasive carcinoma of breast ([place])  02/08/2022 Mammogram   Unilateral diagnostic mammogram /us 1. Suspicious mass left breast 10:30 o'clock. 2. Anterior to this suspicious mass on the true lateral view is an additional small lobular mass which is not visualized on ultrasound.     02/28/2022 Initial Diagnosis    Invasive carcinoma of breast ([place])   03/14/2022 Surgery    patient is status post left breast lumpectomy Pathology showed left mammary carcinoma, no special type, 2 sentinel lymph nodes were excised and both were negative.  Margins are negative for invasive carcinoma. pT1b pN0.  Previous biopsy showed ER positive, PR positive HER2 negative   03/31/2022 Cancer Staging   Staging form: Breast, AJCC 8th Edition - Pathologic stage from 03/31/2022: Stage IA (pT1b, pN0, cM0, G1, ER+, PR+, HER2-) - Signed by Earlie Server, MD on 03/31/2022 Stage prefix: Initial diagnosis Multigene prognostic tests performed: None Histologic grading system: 3 grade system   04/25/2022 - 05/24/2022 Radiation Therapy   S/p adjuvant breast radiation    Genetic Testing   Patient declined genetic testing     INTERVAL HISTORY 71 y.o. female  presents for follow-up for evaluation of breast cancer, anemia due to chronic kidney disease, chronic anticoagulation. Patient currently is on Arimidex, overall tolerates well.  Manageable side effects.  She was previously noncompliant with erythropoietin replacement therapy due to financial reason.  Patient is on Coumadin for anticoagulation long-term. Patient reports being sick since last visit in June 2023. Patient has had multiple admission due to shortness of breath, CHF exacerbation, acute anemia with a hemoglobin of 4.5 with GI bleeding received multiple units of PRBC.   Review of Systems  Constitutional:  Positive for malaise/fatigue. Negative for chills, fever and weight loss.  HENT:  Negative for sore throat.   Eyes:  Negative for redness.  Respiratory:  Negative for cough, shortness of breath and wheezing.   Cardiovascular:  Negative for chest pain, palpitations and leg swelling.  Gastrointestinal:  Negative for abdominal pain, blood in stool, nausea and vomiting.  Genitourinary:  Negative for dysuria.  Musculoskeletal:  Negative for myalgias.  Skin:  Negative for rash.   Neurological:  Negative for dizziness, tingling and tremors.  Endo/Heme/Allergies:  Does not bruise/bleed easily.  Psychiatric/Behavioral:  Negative for hallucinations.     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 Jude mechanical valve 09/19/2003   Arthritis    Breast cancer, left ([place]) 02/22/2022   a.) CNB 02/22/2022 (+) for invasive mammary carcinoma; stage IA (cT1b, cN0, cM0, G1, ER+, PR+, HER2-).   Cerebrovascular accident (CVA) due to embolism of other precerebral artery (HCC)    CKD (chronic kidney disease) stage 4, GFR 15-29 ml/min ([place])  Coronary artery disease    a.) LHC 05/07/2015: 50% RPDA, 50% inf septal, 50% mLCx, 50% pLAD, 40% mRCA; intervention deferred opting for medical mgmt.   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 b.) s/p DCCV 2016 and TEE with cardioversion 09/2018. 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   a.) 28 mm Hemashield like graft    SURGICAL HISTORY: Past Surgical History:  Procedure Laterality Date   ABDOMINAL HYSTERECTOMY     AORTIC VALVE REPLACEMENT N/A 09/19/2003   BREAST BIOPSY Right 01/29/2021   [place] Bx, 6:00 vision clip --> sclerosing intraductal papilloma; (-) for atypia and malignancy   BREAST BIOPSY Right 01/29/2021   [place] Bx, 8:00 Ribbon clip --> usual ductal hyperplasia; (-) for atypia and malignancy   CARDIAC CATHETERIZATION N/A 05/07/2015   Procedure: Left Heart Cath;  Surgeon: Laurier Nancy, MD;  Location: [facility];  Service: Cardiovascular;  Laterality: N/A;   COLONOSCOPY Left 02/20/2019   Procedure: COLONOSCOPY;  Surgeon: Pasty Spillers, MD;  Location: [facility] ENDOSCOPY;  Service: Endoscopy;  Laterality: Left;   COLONOSCOPY N/A 02/21/2019   Procedure: COLONOSCOPY;  Surgeon: Pasty Spillers, MD;  Location: [facility] ENDOSCOPY;  Service: Endoscopy;  Laterality: N/A;   ELECTROPHYSIOLOGIC STUDY N/A 05/05/2015   Procedure: Cardioversion;  Surgeon: Laurier Nancy, MD;  Location: [facility] ORS;  Service: Cardiovascular;  Laterality: N/A;   ENTEROSCOPY N/A 02/21/2019   Procedure: ENTEROSCOPY;  Surgeon: Pasty Spillers, MD;  Location: [facility] END[facility]COPY;  Service: Endoscopy;  Laterality: N/A;   ESOPHAGOGASTRODUODENOSCOPY Left 02/19/2019   Procedure: ESOPHAGOGASTRODUODENOSCOPY (EGD);  Surgeon: Pasty Spillers, MD;  Location: [facility] ENDOSCOPY;  Service: Endoscopy;  Laterality: Left;   EXCISION OF BREAST BIOPSY Right 03/01/2021   Procedure: EXCISION OF BREAST BIOPSY w/ radiofrequency tag;  Surgeon: Carolan Shiver, MD;  Location: [facility] ORS;  Service: General;  Laterality: Right;   PARTIAL MASTECTOMY WITH NEEDLE LOCALIZATION AND AXILLARY SENTINEL LYMPH NODE BX Left 03/14/2022   Procedure: PARTIAL MASTECTOMY WITH Radio Frequency tag AND AXILLARY SENTINEL LYMPH NODE BX;  Surgeon: Carolan Shiver, MD;  Location: [facility] OR[facility];  Service:  General;  Laterality: Left;   REPAIR OF ACUTE ASCENDING THORACIC AORTIC DISSECTION N/A 06/08/2003   Procedure: ASCENDING AORTIC DISSECTION REPAIR AND RESUSPENSION OF AORTIC VALVE (28 mm Hemashield like graft); Location: [facility]; Surgeon: Demetrios Loll, MD   REPLACEMENT TOTAL KNEE BILATERAL     TEE WITH CARDIOVERSION  09/26/2018    SOCIAL HISTORY: Social History   Socioeconomic History   Marital status: Legally Separated    Spouse name: Not on file   Number of children: Not on file   Years of education: Not on file   Highest education level: Not on file  Occupational History   Not on file  Tobacco Use   Smoking status: Former    Packs/day: 0.50    Years: 20.00    Total pack years: 10.00    Types: Cigarettes    Quit date: 2004    Years since quitting: 19.8   Smokeless tobacco: Never  Vaping Use   Vaping Use: Never used  Substance and Sexual Activity   Alcohol use: Yes    Alcohol/week: 0.0 standard drinks of alcohol    Comment: occas   Drug use: No   Sexual activity: Not on file  Other Topics Concern   Not on file  Social History Narrative   Not on file   Social Determinants of Health   Financial Resource Strain: Not on file  Food Insecurity: Not on file  Transportation Needs: Not on file  Physical Activity: Not on file  Stress: Not on file  Social Connections: Not on file  Intimate Partner Violence: Not on file    FAMILY HISTORY: Family History  Problem Relation Age of Onset   Hypertension Mother    Colon cancer Mother    Prostate cancer Father    Kidney cancer Brother    Prostate cancer Brother    Prostate cancer Brother    Breast cancer Neg Hx     ALLERGIES:  is allergic to atorvastatin.  MEDICATIONS:  Current Outpatient Medications  Medication Sig Dispense Refill   amLODipine (NORVASC) 10 MG tablet Take 10 mg by mouth daily.      anastrozole (ARIMIDEX) 1 MG tablet TAKE 1 TABLET(1 MG) BY MOUTH DAILY 30 tablet 3   benazepril (LOTENSIN) 20 MG tablet  Take 20 mg by mouth daily.     folic acid (FOLVITE) 1 MG tablet TAKE 1 TABLET BY MOUTH DAILY 30 tablet 10   furosemide (LASIX) 20 MG tablet Take 20 mg by mouth.     pantoprazole (PROTONIX) 20 MG tablet Take 1 tablet (20 mg total) by mouth daily. 30 tablet 2   rosuvastatin (CRESTOR) 5 MG tablet Take 5 mg by mouth at bedtime.  2   sodium bicarbonate 650 MG tablet Take 650 mg by mouth 2 (two) times daily.     spironolactone (ALDACTONE) 25 MG tablet Take 25 mg by mouth daily.     warfarin (COUMADIN) 4 MG tablet Take 4 mg by mouth See admin instructions.     amoxicillin (AMOXIL) 500 MG tablet Take 2,000 mg by mouth See admin instructions. Take 2000 mg by mouth one hour prior to dental procedures (Patient not taking: Reported on 09/28/2022)     diphenhydramine-acetaminophen (TYLENOL PM) 25-500 MG TABS tablet Take 1 tablet by mouth at bedtime as needed. (Patient not taking: Reported on 09/28/2022)     No current facility-administered medications for this visit.     PHYSICAL EXAMINATION: ECOG PERFORMANCE STATUS: 1 - Symptomatic but completely ambulatory Vitals:   09/28/22 1415  BP: (!) 158/83  Pulse: 80  Resp: 18  Temp: (!) 96.7 F (35.9 C)  SpO2: 98%   Filed Weights   09/28/22 1415  Weight: 152 lb (68.9 kg)    Physical Exam Constitutional:      General: She is not in acute distress.    Appearance: She is not diaphoretic.  HENT:     Head: Normocephalic and atraumatic.     Nose: Nose normal.     Mouth/Throat:     Pharynx: No oropharyngeal exudate.  Eyes:     General: No scleral icterus.       Left eye: No discharge.  Pupils: Pupils are equal, round, and reactive to light.  Neck:     Vascular: No JVD.  Cardiovascular:     Rate and Rhythm: Normal rate. Rhythm irregular.     Heart sounds: Murmur heard.  Pulmonary:     Effort: Pulmonary effort is normal. No respiratory distress.     Breath sounds: No wheezing or rales.  Chest:     Chest wall: No tenderness.  Abdominal:      General: Bowel sounds are normal. There is no distension.     Palpations: Abdomen is soft. There is no mass.     Tenderness: There is no abdominal tenderness. There is no rebound.  Musculoskeletal:        General: No tenderness. Normal range of motion.     Cervical back: Normal range of motion and neck supple.     Right lower leg: Edema present.     Left lower leg: Edema present.  Lymphadenopathy:     Cervical: No cervical adenopathy.  Skin:    General: Skin is warm and dry.     Findings: No erythema or rash.  Neurological:     Mental Status: She is alert and oriented to person, place, and time.     Cranial Nerves: No cranial nerve deficit.     Motor: No abnormal muscle tone.     Coordination: Coordination normal.  Psychiatric:        Mood and Affect: Mood and affect normal.     LABORATORY DATA:  I have reviewed the data as listed    Latest Ref Rng & Units 09/28/2022    1:49 PM 06/23/2022    9:37 AM 05/24/2022    9:14 AM  CBC  WBC 4.0 - 10.5 K/uL 5.5  3.2  3.5   Hemoglobin 12.0 - 15.0 g/dL 8.1  7.6  7.0   Hematocrit 36.0 - 46.0 % 28.4  25.6  23.6   Platelets 150 - 400 K/uL 168  173  169       Latest Ref Rng & Units 09/28/2022    1:49 PM 05/09/2022    1:51 PM 03/14/2022    7:43 AM  CMP  Glucose 70 - 99 mg/dL 92  104  99   BUN 8 - 23 mg/dL 32  66  57   Creatinine 0.44 - 1.00 mg/dL 1.86  3.00  3.00   Sodium 135 - 145 mmol/L 142  140  141   Potassium 3.5 - 5.1 mmol/L 3.7  4.1  4.2   Chloride 98 - 111 mmol/L 110  106  107   CO2 22 - 32 mmol/L 25  24    Calcium 8.9 - 10.3 mg/dL 9.7  9.9    Total Protein 6.5 - 8.1 g/dL 7.0  7.3    Total Bilirubin 0.3 - 1.2 mg/dL 0.5  0.5    Alkaline Phos 38 - 126 U/L 126  76    AST 15 - 41 U/L 16  15    ALT 0 - 44 U/L 18  11       Iron/TIBC/Ferritin/ %Sat    Component Value Date/Time   IRON 41 05/24/2022 0914   TIBC 339 05/24/2022 0914   FERRITIN 65 05/24/2022 0914   IRONPCTSAT 12 05/24/2022 0914  RADIOGRAPHIC STUDIES: I have personally reviewed the radiological images as listed and agreed with the findings in the report. No results found.

## 2022-10-05 ENCOUNTER — Ambulatory Visit: Payer: Medicare Other

## 2022-10-06 ENCOUNTER — Inpatient Hospital Stay: Payer: Medicare Other

## 2022-10-06 VITALS — BP 144/77 | HR 67 | Temp 97.0°F | Resp 18

## 2022-10-06 DIAGNOSIS — D631 Anemia in chronic kidney disease: Secondary | ICD-10-CM

## 2022-10-06 DIAGNOSIS — N184 Chronic kidney disease, stage 4 (severe): Secondary | ICD-10-CM | POA: Diagnosis not present

## 2022-10-06 MED ORDER — SODIUM CHLORIDE 0.9 % IV SOLN
Freq: Once | INTRAVENOUS | Status: AC
  Filled 2022-10-06: qty 250

## 2022-10-06 MED ORDER — SODIUM CHLORIDE 0.9 % IV SOLN
200.0000 mg | Freq: Once | INTRAVENOUS | Status: AC
  Administered 2022-10-06: 200 mg via INTRAVENOUS
  Filled 2022-10-06: qty 200

## 2022-10-12 ENCOUNTER — Ambulatory Visit: Payer: Medicare Other

## 2022-10-13 ENCOUNTER — Inpatient Hospital Stay: Payer: Medicare Other | Attending: Oncology

## 2022-10-13 VITALS — BP 145/84 | HR 68 | Temp 96.2°F | Resp 18

## 2022-10-13 DIAGNOSIS — N184 Chronic kidney disease, stage 4 (severe): Secondary | ICD-10-CM | POA: Diagnosis present

## 2022-10-13 DIAGNOSIS — Z923 Personal history of irradiation: Secondary | ICD-10-CM | POA: Insufficient documentation

## 2022-10-13 DIAGNOSIS — D631 Anemia in chronic kidney disease: Secondary | ICD-10-CM | POA: Insufficient documentation

## 2022-10-13 DIAGNOSIS — C50912 Malignant neoplasm of unspecified site of left female breast: Secondary | ICD-10-CM | POA: Diagnosis not present

## 2022-10-13 DIAGNOSIS — Z170 Estrogen receptor positive status [ER+]: Secondary | ICD-10-CM | POA: Insufficient documentation

## 2022-10-13 MED ORDER — SODIUM CHLORIDE 0.9 % IV SOLN
200.0000 mg | Freq: Once | INTRAVENOUS | Status: AC
  Administered 2022-10-13: 200 mg via INTRAVENOUS
  Filled 2022-10-13: qty 200

## 2022-10-13 MED ORDER — SODIUM CHLORIDE 0.9 % IV SOLN
Freq: Once | INTRAVENOUS | Status: AC
  Filled 2022-10-13: qty 250

## 2022-10-13 NOTE — Patient Instructions (Signed)

## 2022-10-19 ENCOUNTER — Ambulatory Visit: Payer: Medicare Other

## 2022-10-20 ENCOUNTER — Inpatient Hospital Stay: Payer: Medicare Other

## 2022-10-20 VITALS — BP 146/76 | HR 71 | Temp 98.7°F | Resp 16

## 2022-10-20 DIAGNOSIS — D631 Anemia in chronic kidney disease: Secondary | ICD-10-CM

## 2022-10-20 DIAGNOSIS — C50912 Malignant neoplasm of unspecified site of left female breast: Secondary | ICD-10-CM | POA: Diagnosis not present

## 2022-10-20 MED ORDER — SODIUM CHLORIDE 0.9 % IV SOLN
INTRAVENOUS | Status: DC
  Filled 2022-10-20: qty 250

## 2022-10-20 MED ORDER — SODIUM CHLORIDE 0.9 % IV SOLN
200.0000 mg | Freq: Once | INTRAVENOUS | Status: AC
  Administered 2022-10-20: 200 mg via INTRAVENOUS
  Filled 2022-10-20: qty 200

## 2022-10-20 NOTE — Progress Notes (Signed)
Pt tolerated treatment without complaints.  VSS.  Pt refused to wait 30 minute post observation due to need to return to work.  Explained to pt that it would be in her best interest to stay, but if signs/symptoms of reaction occur to present to ED.  Pt verbalized understanding.

## 2022-10-20 NOTE — Patient Instructions (Signed)

## 2022-10-21 ENCOUNTER — Other Ambulatory Visit: Payer: Self-pay | Admitting: Oncology

## 2022-10-26 ENCOUNTER — Other Ambulatory Visit: Payer: Medicare Other

## 2022-10-26 ENCOUNTER — Ambulatory Visit: Payer: Medicare Other

## 2022-10-26 MED FILL — Iron Sucrose Inj 20 MG/ML (Fe Equiv): INTRAVENOUS | Qty: 10 | Status: AC

## 2022-10-27 ENCOUNTER — Inpatient Hospital Stay: Payer: Medicare Other

## 2022-10-27 ENCOUNTER — Other Ambulatory Visit: Payer: Self-pay | Admitting: Oncology

## 2022-10-27 VITALS — BP 148/72

## 2022-10-27 DIAGNOSIS — D631 Anemia in chronic kidney disease: Secondary | ICD-10-CM

## 2022-10-27 DIAGNOSIS — C50912 Malignant neoplasm of unspecified site of left female breast: Secondary | ICD-10-CM | POA: Diagnosis not present

## 2022-10-27 LAB — CBC WITH DIFFERENTIAL/PLATELET
Abs Immature Granulocytes: 0.01 10*3/uL (ref 0.00–0.07)
Basophils Absolute: 0 10*3/uL (ref 0.0–0.1)
Basophils Relative: 1 %
Eosinophils Absolute: 0.1 10*3/uL (ref 0.0–0.5)
Eosinophils Relative: 2 %
HCT: 31.3 % — ABNORMAL LOW (ref 36.0–46.0)
Hemoglobin: 9.3 g/dL — ABNORMAL LOW (ref 12.0–15.0)
Immature Granulocytes: 0 %
Lymphocytes Relative: 10 %
Lymphs Abs: 0.3 10*3/uL — ABNORMAL LOW (ref 0.7–4.0)
MCH: 27.6 pg (ref 26.0–34.0)
MCHC: 29.7 g/dL — ABNORMAL LOW (ref 30.0–36.0)
MCV: 92.9 fL (ref 80.0–100.0)
Monocytes Absolute: 0.3 10*3/uL (ref 0.1–1.0)
Monocytes Relative: 8 %
Neutro Abs: 2.7 10*3/uL (ref 1.7–7.7)
Neutrophils Relative %: 79 %
Platelets: 149 10*3/uL — ABNORMAL LOW (ref 150–400)
RBC: 3.37 MIL/uL — ABNORMAL LOW (ref 3.87–5.11)
RDW: 16.4 % — ABNORMAL HIGH (ref 11.5–15.5)
WBC: 3.4 10*3/uL — ABNORMAL LOW (ref 4.0–10.5)
nRBC: 0 % (ref 0.0–0.2)

## 2022-10-27 LAB — IRON AND TIBC
Iron: 44 ug/dL (ref 28–170)
Saturation Ratios: 14 % (ref 10.4–31.8)
TIBC: 325 ug/dL (ref 250–450)
UIBC: 281 ug/dL

## 2022-10-27 LAB — FERRITIN: Ferritin: 336 ng/mL — ABNORMAL HIGH (ref 11–307)

## 2022-10-27 MED ORDER — EPOETIN ALFA 20000 UNIT/ML IJ SOLN: 60000.0000 [IU] | Freq: Once | INTRAMUSCULAR | Status: DC

## 2022-10-27 MED ORDER — EPOETIN ALFA-EPBX 20000 UNIT/ML IJ SOLN
20000.0000 [IU] | Freq: Once | INTRAMUSCULAR | Status: AC
  Administered 2022-10-27: 20000 [IU] via SUBCUTANEOUS
  Filled 2022-10-27: qty 1

## 2022-10-27 MED ORDER — EPOETIN ALFA-EPBX 40000 UNIT/ML IJ SOLN
40000.0000 [IU] | Freq: Once | INTRAMUSCULAR | Status: AC
  Administered 2022-10-27: 40000 [IU] via SUBCUTANEOUS
  Filled 2022-10-27: qty 1

## 2022-11-07 ENCOUNTER — Inpatient Hospital Stay: Payer: Medicare Other

## 2022-11-07 VITALS — BP 142/92 | HR 56

## 2022-11-07 DIAGNOSIS — C50912 Malignant neoplasm of unspecified site of left female breast: Secondary | ICD-10-CM | POA: Diagnosis not present

## 2022-11-07 DIAGNOSIS — N184 Chronic kidney disease, stage 4 (severe): Secondary | ICD-10-CM

## 2022-11-07 LAB — HEMOGLOBIN AND HEMATOCRIT, BLOOD
HCT: 34.1 % — ABNORMAL LOW (ref 36.0–46.0)
Hemoglobin: 10.2 g/dL — ABNORMAL LOW (ref 12.0–15.0)

## 2022-11-07 MED ORDER — HEPARIN SOD (PORK) LOCK FLUSH 100 UNIT/ML IV SOLN
500.0000 [IU] | Freq: Once | INTRAVENOUS | Status: DC | PRN
  Filled 2022-11-07: qty 5

## 2022-11-07 MED ORDER — EPOETIN ALFA-EPBX 20000 UNIT/ML IJ SOLN
20000.0000 [IU] | Freq: Once | INTRAMUSCULAR | Status: AC
  Administered 2022-11-07: 20000 [IU] via SUBCUTANEOUS
  Filled 2022-11-07: qty 1

## 2022-11-07 MED ORDER — SODIUM CHLORIDE 0.9 % IV SOLN: 200.0000 mg | Freq: Once | INTRAVENOUS | Status: DC

## 2022-11-07 MED ORDER — SODIUM CHLORIDE 0.9% FLUSH
10.0000 mL | INTRAVENOUS | Status: DC | PRN
  Filled 2022-11-07: qty 10

## 2022-11-07 MED ORDER — HEPARIN SOD (PORK) LOCK FLUSH 100 UNIT/ML IV SOLN
250.0000 [IU] | Freq: Once | INTRAVENOUS | Status: DC | PRN
  Filled 2022-11-07: qty 5

## 2022-11-07 MED ORDER — SODIUM CHLORIDE 0.9% FLUSH
3.0000 mL | Freq: Once | INTRAVENOUS | Status: DC | PRN
  Filled 2022-11-07: qty 3

## 2022-11-07 MED ORDER — EPOETIN ALFA-EPBX 40000 UNIT/ML IJ SOLN
40000.0000 [IU] | Freq: Once | INTRAMUSCULAR | Status: AC
  Administered 2022-11-07: 40000 [IU] via SUBCUTANEOUS
  Filled 2022-11-07: qty 1

## 2022-11-23 ENCOUNTER — Ambulatory Visit: Payer: Medicare Other

## 2022-11-23 ENCOUNTER — Other Ambulatory Visit: Payer: Medicare Other

## 2022-12-08 ENCOUNTER — Encounter: Payer: Self-pay | Admitting: Radiation Oncology

## 2022-12-08 ENCOUNTER — Ambulatory Visit
Admission: RE | Admit: 2022-12-08 | Discharge: 2022-12-08 | Disposition: A | Discharge status: Home / Self Care | Payer: Medicare Other | Source: Ambulatory Visit | Attending: Radiation Oncology | Admitting: Radiation Oncology

## 2022-12-08 VITALS — BP 147/77 | HR 74 | Temp 97.6°F | Resp 16 | Ht 60.0 in | Wt 147.0 lb

## 2022-12-08 DIAGNOSIS — C50919 Malignant neoplasm of unspecified site of unspecified female breast: Secondary | ICD-10-CM

## 2022-12-08 DIAGNOSIS — Z170 Estrogen receptor positive status [ER+]: Secondary | ICD-10-CM | POA: Insufficient documentation

## 2022-12-08 DIAGNOSIS — Z923 Personal history of irradiation: Secondary | ICD-10-CM | POA: Diagnosis not present

## 2022-12-08 DIAGNOSIS — Z79811 Long term (current) use of aromatase inhibitors: Secondary | ICD-10-CM | POA: Insufficient documentation

## 2022-12-08 DIAGNOSIS — C50912 Malignant neoplasm of unspecified site of left female breast: Secondary | ICD-10-CM | POA: Diagnosis present

## 2022-12-08 NOTE — Progress Notes (Signed)
Radiation Oncology Follow up Note  Name: Jeanette Romero   Date:   12/08/2022 MRN:  [id number] DOB: Oct 17, 1951    This 71 y.o. female presents to the clinic today for 53-monthfollow-up status post radiation therapy to her left whole breast for stage Ia (T1b N0 M0) ER/PR positive invasive mammary carcinoma.  REFERRING PROVIDER: HMaryland Pink MD  HPI: Patient is a 71year old female now out 6 months having completed the left breast for stage Ia ER positive invasive mammary carcinoma seen today in routine follow-up she is doing well she feels her left breast is somewhat truncated which is a side effect of her radiation.  Specifically denies breast tenderness cough or bone pain.  She has not yet had a mammogram.  She is currently on.  Arimidex tolerating that well without side effect.  COMPLICATIONS OF TREATMENT: none  FOLLOW UP COMPLIANCE: keeps appointments   PHYSICAL EXAM:  BP (!) 147/77   Pulse 74   Temp 97.6 F (36.4 C) (Tympanic)   Resp 16   Ht 5' (1.524 m)   Wt 147 lb (66.7 kg)   BMI 28.71 kg/m  Lungs are clear to A&P cardiac examination essentially unremarkable with regular rate and rhythm. No dominant mass or nodularity is noted in either breast in 2 positions examined. Incision is well-healed. No axillary or supraclavicular adenopathy is appreciated. Cosmetic result is excellent.  Well-developed well-nourished patient in NAD. HEENT reveals PERLA, EOMI, discs not visualized.  Oral cavity is clear. No oral mucosal lesions are identified. Neck is clear without evidence of cervical or supraclavicular adenopathy. Lungs are clear to A&P. Cardiac examination is essentially unremarkable with regular rate and rhythm without murmur rub or thrill. Abdomen is benign with no organomegaly or masses noted. Motor sensory and DTR levels are equal and symmetric in the upper and lower extremities. Cranial nerves II through XII are grossly intact. Proprioception is intact. No peripheral adenopathy  or edema is identified. No motor or sensory levels are noted. Crude visual fields are within normal range.  RADIOLOGY RESULTS: No current films for review  PLAN: Present time patient is doing well minor side effect profile from her radiation therapy with some shrinkage of the left breast.  Cosmetic result is still good to excellent.  I have asked to see her back in 6 months for follow-up she is scheduled for mammograms in February.  Patient is to call with any concerns.  I would like to take this opportunity to thank you for allowing me to participate in the care of your patient..Noreene Filbert MD

## 2022-12-22 ENCOUNTER — Inpatient Hospital Stay: Payer: Medicare Other | Attending: Oncology

## 2022-12-22 ENCOUNTER — Encounter: Payer: Self-pay | Admitting: Oncology

## 2022-12-22 ENCOUNTER — Inpatient Hospital Stay: Payer: Medicare Other

## 2022-12-22 ENCOUNTER — Inpatient Hospital Stay (HOSPITAL_BASED_OUTPATIENT_CLINIC_OR_DEPARTMENT_OTHER): Payer: Medicare Other | Admitting: Oncology

## 2022-12-22 VITALS — BP 145/71 | HR 69 | Temp 97.7°F | Wt 145.4 lb

## 2022-12-22 DIAGNOSIS — I4821 Permanent atrial fibrillation: Secondary | ICD-10-CM | POA: Diagnosis not present

## 2022-12-22 DIAGNOSIS — C50919 Malignant neoplasm of unspecified site of unspecified female breast: Secondary | ICD-10-CM

## 2022-12-22 DIAGNOSIS — Z170 Estrogen receptor positive status [ER+]: Secondary | ICD-10-CM | POA: Insufficient documentation

## 2022-12-22 DIAGNOSIS — Z90710 Acquired absence of both cervix and uterus: Secondary | ICD-10-CM | POA: Diagnosis not present

## 2022-12-22 DIAGNOSIS — Z79811 Long term (current) use of aromatase inhibitors: Secondary | ICD-10-CM | POA: Insufficient documentation

## 2022-12-22 DIAGNOSIS — Z923 Personal history of irradiation: Secondary | ICD-10-CM | POA: Insufficient documentation

## 2022-12-22 DIAGNOSIS — I5032 Chronic diastolic (congestive) heart failure: Secondary | ICD-10-CM | POA: Insufficient documentation

## 2022-12-22 DIAGNOSIS — Z79899 Other long term (current) drug therapy: Secondary | ICD-10-CM | POA: Insufficient documentation

## 2022-12-22 DIAGNOSIS — C50212 Malignant neoplasm of upper-inner quadrant of left female breast: Secondary | ICD-10-CM | POA: Diagnosis not present

## 2022-12-22 DIAGNOSIS — D631 Anemia in chronic kidney disease: Secondary | ICD-10-CM | POA: Diagnosis present

## 2022-12-22 DIAGNOSIS — N6099 Unspecified benign mammary dysplasia of unspecified breast: Secondary | ICD-10-CM

## 2022-12-22 DIAGNOSIS — I130 Hypertensive heart and chronic kidney disease with heart failure and stage 1 through stage 4 chronic kidney disease, or unspecified chronic kidney disease: Secondary | ICD-10-CM | POA: Diagnosis not present

## 2022-12-22 DIAGNOSIS — Z7901 Long term (current) use of anticoagulants: Secondary | ICD-10-CM | POA: Insufficient documentation

## 2022-12-22 DIAGNOSIS — N184 Chronic kidney disease, stage 4 (severe): Secondary | ICD-10-CM | POA: Diagnosis present

## 2022-12-22 LAB — HEMOGLOBIN AND HEMATOCRIT, BLOOD
HCT: 30.1 % — ABNORMAL LOW (ref 36.0–46.0)
Hemoglobin: 9.2 g/dL — ABNORMAL LOW (ref 12.0–15.0)

## 2022-12-22 MED ORDER — EPOETIN ALFA-EPBX 40000 UNIT/ML IJ SOLN
40000.0000 [IU] | Freq: Once | INTRAMUSCULAR | Status: AC
  Administered 2022-12-22: 40000 [IU] via SUBCUTANEOUS
  Filled 2022-12-22: qty 1

## 2022-12-22 MED ORDER — EPOETIN ALFA-EPBX 10000 UNIT/ML IJ SOLN
20000.0000 [IU] | Freq: Once | INTRAMUSCULAR | Status: AC
  Administered 2022-12-22: 20000 [IU] via SUBCUTANEOUS
  Filled 2022-12-22: qty 2

## 2022-12-22 NOTE — Assessment & Plan Note (Signed)
Patient is on aromatase inhibitor.

## 2022-12-22 NOTE — Progress Notes (Signed)
Hematology/Oncology Progress note [phone] [phone]      Patient Care Team: [place] Pink, MD as PCP - General (Family Medicine) Earlie Server, MD as Consulting Physician (Hematology and Oncology) Theodore Demark, RN (Inactive) as Oncology Nurse Navigator  ASSESSMENT & PLAN:   Cancer Staging  Invasive carcinoma of breast [facility]) Staging form: Breast, AJCC 8th Edition - Pathologic stage from 03/31/2022: Stage IA (pT1b, pN0, cM0, G1, ER+, PR+, HER2-) - Signed by Earlie Server, MD on 03/31/2022   Invasive carcinoma of breast ([place]) Left breast cancer, stage IA, pT1b N0 ER/PR positive, HER2 negative. Oncotype Dx was not sent due to patient being a poor candidate for chemotherapy due to multiple medical problems.She is not interested in chemotherapy as well Patient tolerates Arimidex. Recommend patient to continue. Recommend DEXA, patient would like to defer at this point. Recommend calcium and vitamin D supplementation  Obtain annual diagnostic mammogram-February 2024  Atypical lobular hyperplasia [facility]) of breast Patient is on aromatase inhibitor.    Anemia of chronic renal failure Continue monthly +/- Retacrit if hemoglobin is less than 10. Orders Placed This Encounter  Procedures   Hemoglobin and hematocrit, blood    Standing Status:   Standing    Number of Occurrences:   3    Standing Expiration Date:   12/23/2023   CBC with Differential/Platelet    Standing Status:   Future    Standing Expiration Date:   12/23/2023   Comprehensive metabolic panel    Standing Status:   Future    Standing Expiration Date:   12/22/2023   Iron and TIBC    Standing Status:   Future    Standing Expiration Date:   12/23/2023   Ferritin    Standing Status:   Future    Standing Expiration Date:   12/23/2023   Follow up  H&H labs in 4w, 8w,12w +/- retacrit 4 months, lab prior to MD +/- retacrit. +/- Venfoer -cbc cmp iron tibc ferritin Bilateral diagnostic mammogram in Feb  2024  All questions were answered. The patient knows to call the clinic with any problems, questions or concerns.  Earlie Server, MD, PhD [facility] Hematology Oncology 12/22/2022    REASON FOR VISIT Follow up for breast cancer, anemia secondary to chronic kidney disease, chronic anticoagulation  HISTORY OF PRESENTING ILLNESS:  Patient presents for follow-up of breast cancer, anemia secondary to chronic kidney disease, chronic anticoagulation Oncology history summary listed as below. Oncology History  Invasive carcinoma of breast ([place])  02/08/2022 Mammogram   Unilateral diagnostic mammogram /us 1. Suspicious mass left breast 10:30 o'clock. 2. Anterior to this suspicious mass on the true lateral view is an additional small lobular mass which is not visualized on ultrasound.     02/28/2022 Initial Diagnosis   Invasive carcinoma of breast ([place])   03/14/2022 Surgery    patient is status post left breast lumpectomy Pathology showed left mammary carcinoma, no special type, 2 sentinel lymph nodes were excised and both were negative.  Margins are negative for invasive carcinoma. pT1b pN0.  Previous biopsy showed ER positive, PR positive HER2 negative   03/31/2022 Cancer Staging   Staging form: Breast, AJCC 8th Edition - Pathologic stage from 03/31/2022: Stage IA (pT1b, pN0, cM0, G1, ER+, PR+, HER2-) - Signed by Earlie Server, MD on 03/31/2022 Stage prefix: Initial diagnosis Multigene prognostic tests performed: None Histologic grading system: 3 grade system   04/25/2022 - 05/24/2022 Radiation Therapy   S/p adjuvant breast radiation    Genetic Testing  Patient declined genetic testing   05/26/2022 -  Anti-estrogen oral therapy   Started Arimidex     INTERVAL HISTORY 72 y.o. female  presents for follow-up for evaluation of breast cancer, anemia due to chronic kidney disease, chronic anticoagulation. Patient currently is on Arimidex, overall tolerates well.  Manageable side effects.   Patient  reports feeling well.  No new complaints.    Review of Systems  Constitutional:  Positive for malaise/fatigue. Negative for chills, fever and weight loss.  HENT:  Negative for sore throat.   Eyes:  Negative for redness.  Respiratory:  Negative for cough, shortness of breath and wheezing.   Cardiovascular:  Negative for chest pain, palpitations and leg swelling.  Gastrointestinal:  Negative for abdominal pain, blood in stool, nausea and vomiting.  Genitourinary:  Negative for dysuria.  Musculoskeletal:  Negative for myalgias.  Skin:  Negative for rash.  Neurological:  Negative for dizziness, tingling and tremors.  Endo/Heme/Allergies:  Does not bruise/bleed easily.  Psychiatric/Behavioral:  Negative for hallucinations.     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 Jude mechanical valve 09/19/2003   Arthritis    Breast cancer, left ([place]) 02/22/2022   a.) CNB 02/22/2022 (+) for invasive mammary carcinoma; stage IA (cT1b, cN0, cM0, G1, ER+, PR+, HER2-).   Cerebrovascular accident (CVA) due to embolism of other precerebral artery (HCC)    CKD (chronic kidney disease) stage 4, GFR 15-29 ml/min (HCC)    Coronary artery disease    a.) LHC 05/07/2015: 50% RPDA, 50% inf septal, 50% mLCx, 50% pLAD, 40% mRCA; intervention deferred opting for medical mgmt.   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 b.) s/p DCCV 2016 and TEE with cardioversion 09/2018. 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   a.) 28 mm Hemashield like graft    SURGICAL HISTORY: Past Surgical History:  Procedure Laterality Date   ABDOMINAL HYSTERECTOMY     AORTIC VALVE REPLACEMENT N/A 09/19/2003   BREAST BIOPSY Right 01/29/2021   [place] Bx, 6:00 vision clip --> sclerosing intraductal papilloma; (-) for atypia and malignancy   BREAST BIOPSY Right 01/29/2021   [place] Bx, 8:00 Ribbon clip --> usual ductal hyperplasia; (-) for atypia and malignancy   CARDIAC CATHETERIZATION N/A 05/07/2015   Procedure: Left Heart  Cath;  Surgeon: [name], MD;  Location: [facility];  Service: Cardiovascular;  Laterality: N/A;   COLONOSCOPY Left 02/20/2019   Procedure: COLONOSCOPY;  Surgeon: Virgel Manifold, MD;  Location: [facility];  Service: Endoscopy;  Laterality: Left;   COLONOSCOPY N/A 02/21/2019   Procedure: COLONOSCOPY;  Surgeon: Virgel Manifold, MD;  Location: [facility] END[facility];  Service: Endoscopy;  Laterality: N/A;   ELECTROPHYSIOLOGIC STUDY N/A 05/05/2015   Procedure: Cardioversion;  Surgeon: [name], MD;  Location: [facility];  Service: Cardiovascular;  Laterality: N/A;   ENTEROSCOPY N/A 02/21/2019   Procedure: ENTEROSCOPY;  Surgeon: Virgel Manifold, MD;  Location: [facility] END[facility];  Service: Endoscopy;  Laterality: N/A;   ESOPHAGOGASTRODUODENOSCOPY Left 02/19/2019   Procedure: ESOPHAGOGASTRODUODENOSCOPY (EGD);   Surgeon: Virgel Manifold, MD;  Location: [facility] END[facility];  Service: Endoscopy;  Laterality: Left;   EXCISION OF BREAST BIOPSY Right 03/01/2021   Procedure: EXCISION OF BREAST BIOPSY w/ radiofrequency tag;  Surgeon: Herbert Pun, MD;  Location: [facility];  Service: General;  Laterality: Right;   PARTIAL MASTECTOMY WITH NEEDLE LOCALIZATION AND AXILLARY SENTINEL LYMPH NODE BX Left 03/14/2022   Procedure: PARTIAL MASTECTOMY WITH Radio Frequency tag AND AXILLARY SENTINEL LYMPH NODE BX;  Surgeon: Herbert Pun, MD;  Location: [facility];  Service: General;  Laterality: Left;   REPAIR OF ACUTE ASCENDING THORACIC AORTIC DISSECTION N/A 06/08/2003   Procedure: ASCENDING AORTIC DISSECTION REPAIR AND RESUSPENSION OF AORTIC VALVE (28 mm Hemashield like graft); Location: [facility]; Surgeon: Druscilla Brownie, MD   REPLACEMENT TOTAL KNEE BILATERAL     TEE WITH CARDIOVERSION  09/26/2018    SOCIAL HISTORY: Social History   Socioeconomic History   Marital status: Legally Separated    Spouse name: Not on file   Number of children: Not on file   Years of education: Not on file   Highest education level: Not on file  Occupational History   Not on file  Tobacco Use   Smoking status: Former    Packs/day: 0.50    Years: 20.00    Total pack years: 10.00    Types: Cigarettes    Quit date: 2004    Years since quitting: 20.0   Smokeless tobacco: Never  Vaping Use   Vaping Use: Never used  Substance and Sexual Activity   Alcohol use: Yes    Alcohol/week: 0.0 standard drinks of alcohol    Comment: occas   Drug use: No   Sexual activity: Not on file  Other Topics Concern   Not on file  Social History Narrative   Not on file   Social Determinants of Health   Financial Resource Strain: Not on file  Food Insecurity: Not on file  Transportation Needs: Not on file  Physical Activity: Not on file  Stress: Not on file  Social Connections: Not on file  Intimate Partner Violence: Not on file     FAMILY HISTORY: Family History  Problem Relation Age of Onset   Hypertension Mother    Colon cancer Mother    Prostate cancer Father    Kidney cancer Brother    Prostate cancer Brother    Prostate cancer Brother    Breast cancer Neg Hx     ALLERGIES:  is allergic to atorvastatin.  MEDICATIONS:  Current Outpatient Medications  Medication Sig Dispense Refill   amLODipine (NORVASC) 10 MG tablet Take 10 mg by mouth daily.      anastrozole (ARIMIDEX) 1 MG tablet TAKE 1 TABLET(1 MG) BY MOUTH DAILY 30 tablet  3   benazepril (LOTENSIN) 20 MG tablet Take 20 mg by mouth daily.     diphenhydramine-acetaminophen (TYLENOL PM) 25-500 MG TABS tablet Take 1 tablet by mouth at bedtime as needed.     folic acid (FOLVITE) 1 MG tablet TAKE 1 TABLET BY MOUTH DAILY 30 tablet 10   furosemide (LASIX) 20 MG tablet Take 20 mg by mouth.     pantoprazole (PROTONIX) 20 MG tablet Take 1 tablet (20 mg total) by mouth daily. 30 tablet 2   rosuvastatin (CRESTOR) 5 MG tablet Take 5 mg by mouth at bedtime.  2   sodium bicarbonate 650 MG tablet Take 650 mg by mouth 2 (two) times daily.     spironolactone (ALDACTONE) 25 MG tablet Take 25 mg by mouth daily.     warfarin (COUMADIN) 4 MG tablet Take 4 mg by mouth See admin instructions.     amoxicillin (AMOXIL) 500 MG tablet Take 2,000 mg by mouth See admin instructions. Take 2000 mg by mouth one hour prior to dental procedures (Patient not taking: Reported on 09/28/2022)     No current facility-administered medications for this visit.     PHYSICAL EXAMINATION: ECOG PERFORMANCE STATUS: 1 - Symptomatic but completely ambulatory Vitals:   12/22/22 1328  BP: (!) 145/71  Pulse: 69  Temp: 97.7 F (36.5 C)  SpO2: 97%   Filed Weights   12/22/22 1328  Weight: 145 lb 6.4 oz (66 kg)    Physical Exam Constitutional:      General: She is not in acute distress.    Appearance: She is not diaphoretic.  HENT:     Head: Normocephalic and atraumatic.     Nose:  Nose normal.     Mouth/Throat:     Pharynx: No oropharyngeal exudate.  Eyes:     General: No scleral icterus.       Left eye: No discharge.     Pupils: Pupils are equal, round, and reactive to light.  Neck:     Vascular: No JVD.  Cardiovascular:     Rate and Rhythm: Normal rate. Rhythm irregular.     Heart sounds: Murmur heard.  Pulmonary:     Effort: Pulmonary effort is normal. No respiratory distress.     Breath sounds: No wheezing or rales.  Chest:     Chest wall: No tenderness.  Abdominal:     General: Bowel sounds are normal. There is no distension.     Palpations: Abdomen is soft. There is no mass.     Tenderness: There is no abdominal tenderness. There is no rebound.  Musculoskeletal:        General: No tenderness. Normal range of motion.     Cervical back: Normal range of motion and neck supple.     Right lower leg: Edema present.     Left lower leg: Edema present.  Lymphadenopathy:     Cervical: No cervical adenopathy.  Skin:    General: Skin is warm and dry.     Findings: No erythema or rash.  Neurological:     Mental Status: She is alert and oriented to person, place, and time.     Cranial Nerves: No cranial nerve deficit.     Motor: No abnormal muscle tone.     Coordination: Coordination normal.  Psychiatric:        Mood and Affect: Mood and affect normal.     LABORATORY DATA:  I have reviewed the data as listed    Latest Ref Rng &  Units 12/22/2022    1:15 PM 11/07/2022   11:04 AM 10/27/2022   11:13 AM  CBC  WBC 4.0 - 10.5 K/uL   3.4   Hemoglobin 12.0 - 15.0 g/dL 9.2  10.2  9.3   Hematocrit 36.0 - 46.0 % 30.1  34.1  31.3   Platelets 150 - 400 K/uL   149       Latest Ref Rng & Units 09/28/2022    1:49 PM 05/09/2022    1:51 PM 03/14/2022    7:43 AM  CMP  Glucose 70 - 99 mg/dL 92  104  99   BUN 8 - 23 mg/dL 32  66  57   Creatinine 0.44 - 1.00 mg/dL 1.86  3.00  3.00   Sodium 135 - 145 mmol/L 142  140  141   Potassium 3.5 - 5.1 mmol/L 3.7  4.1  4.2    Chloride 98 - 111 mmol/L 110  106  107   CO2 22 - 32 mmol/L 25  24    Calcium 8.9 - 10.3 mg/dL 9.7  9.9    Total Protein 6.5 - 8.1 g/dL 7.0  7.3    Total Bilirubin 0.3 - 1.2 mg/dL 0.5  0.5    Alkaline Phos 38 - 126 U/L 126  76    AST 15 - 41 U/L 16  15    ALT 0 - 44 U/L 18  11       Iron/TIBC/Ferritin/ %Sat    Component Value Date/Time   IRON 44 10/27/2022 1113   TIBC 325 10/27/2022 1113   FERRITIN 336 (H) 10/27/2022 1113   IRONPCTSAT 14 10/27/2022 1113                                                                                                                                                        RADIOGRAPHIC STUDIES: I have personally reviewed the radiological images as listed and agreed with the findings in the report. No results found.

## 2022-12-22 NOTE — Assessment & Plan Note (Signed)
Continue monthly +/- Retacrit if hemoglobin is less than 10.

## 2022-12-22 NOTE — Assessment & Plan Note (Signed)
Left breast cancer, stage IA, pT1b N0 ER/PR positive, HER2 negative. Oncotype Dx was not sent due to patient being a poor candidate for chemotherapy due to multiple medical problems.She is not interested in chemotherapy as well Patient tolerates Arimidex. Recommend patient to continue. Recommend DEXA, patient would like to defer at this point. Recommend calcium and vitamin D supplementation  Obtain annual diagnostic mammogram-February 2024 

## 2023-01-18 ENCOUNTER — Inpatient Hospital Stay: Payer: Medicare Other | Attending: Oncology

## 2023-01-18 ENCOUNTER — Inpatient Hospital Stay: Payer: Medicare Other

## 2023-01-18 VITALS — BP 133/64

## 2023-01-18 DIAGNOSIS — N184 Chronic kidney disease, stage 4 (severe): Secondary | ICD-10-CM | POA: Insufficient documentation

## 2023-01-18 DIAGNOSIS — D631 Anemia in chronic kidney disease: Secondary | ICD-10-CM

## 2023-01-18 LAB — HEMOGLOBIN AND HEMATOCRIT, BLOOD
HCT: 27.2 % — ABNORMAL LOW (ref 36.0–46.0)
Hemoglobin: 8 g/dL — ABNORMAL LOW (ref 12.0–15.0)

## 2023-01-18 MED ORDER — EPOETIN ALFA-EPBX 40000 UNIT/ML IJ SOLN
40000.0000 [IU] | Freq: Once | INTRAMUSCULAR | Status: AC
  Administered 2023-01-18: 40000 [IU] via SUBCUTANEOUS
  Filled 2023-01-18: qty 1

## 2023-01-18 MED ORDER — EPOETIN ALFA-EPBX 10000 UNIT/ML IJ SOLN
20000.0000 [IU] | Freq: Once | INTRAMUSCULAR | Status: AC
  Administered 2023-01-18: 20000 [IU] via SUBCUTANEOUS
  Filled 2023-01-18: qty 2

## 2023-01-19 ENCOUNTER — Ambulatory Visit
Admission: RE | Admit: 2023-01-19 | Discharge: 2023-01-19 | Disposition: A | Discharge status: Home / Self Care | Payer: Medicare Other | Source: Ambulatory Visit | Attending: Oncology | Admitting: Oncology

## 2023-01-19 DIAGNOSIS — C50919 Malignant neoplasm of unspecified site of unspecified female breast: Secondary | ICD-10-CM

## 2023-01-19 DIAGNOSIS — Z1239 Encounter for other screening for malignant neoplasm of breast: Secondary | ICD-10-CM | POA: Diagnosis not present

## 2023-01-19 DIAGNOSIS — R92323 Mammographic fibroglandular density, bilateral breasts: Secondary | ICD-10-CM | POA: Diagnosis not present

## 2023-01-19 HISTORY — DX: Personal history of irradiation: Z92.3

## 2023-02-15 ENCOUNTER — Inpatient Hospital Stay: Payer: Medicare Other

## 2023-02-15 ENCOUNTER — Inpatient Hospital Stay: Payer: Medicare Other | Attending: Oncology

## 2023-02-15 VITALS — BP 129/78

## 2023-02-15 DIAGNOSIS — N184 Chronic kidney disease, stage 4 (severe): Secondary | ICD-10-CM

## 2023-02-15 DIAGNOSIS — D631 Anemia in chronic kidney disease: Secondary | ICD-10-CM | POA: Insufficient documentation

## 2023-02-15 LAB — HEMOGLOBIN AND HEMATOCRIT, BLOOD
HCT: 30 % — ABNORMAL LOW (ref 36.0–46.0)
Hemoglobin: 8.7 g/dL — ABNORMAL LOW (ref 12.0–15.0)

## 2023-02-15 MED ORDER — EPOETIN ALFA-EPBX 10000 UNIT/ML IJ SOLN
20000.0000 [IU] | Freq: Once | INTRAMUSCULAR | Status: AC
  Administered 2023-02-15: 20000 [IU] via SUBCUTANEOUS
  Filled 2023-02-15: qty 2

## 2023-02-15 MED ORDER — EPOETIN ALFA-EPBX 40000 UNIT/ML IJ SOLN
40000.0000 [IU] | Freq: Once | INTRAMUSCULAR | Status: AC
  Administered 2023-02-15: 40000 [IU] via SUBCUTANEOUS
  Filled 2023-02-15: qty 1

## 2023-03-15 ENCOUNTER — Inpatient Hospital Stay: Payer: Medicare Other | Attending: Oncology

## 2023-03-15 ENCOUNTER — Inpatient Hospital Stay: Payer: Medicare Other

## 2023-03-15 VITALS — BP 132/75 | HR 88

## 2023-03-15 DIAGNOSIS — D631 Anemia in chronic kidney disease: Secondary | ICD-10-CM | POA: Insufficient documentation

## 2023-03-15 DIAGNOSIS — N184 Chronic kidney disease, stage 4 (severe): Secondary | ICD-10-CM | POA: Insufficient documentation

## 2023-03-15 LAB — HEMOGLOBIN AND HEMATOCRIT, BLOOD
HCT: 30 % — ABNORMAL LOW (ref 36.0–46.0)
Hemoglobin: 8.9 g/dL — ABNORMAL LOW (ref 12.0–15.0)

## 2023-03-15 MED ORDER — EPOETIN ALFA-EPBX 40000 UNIT/ML IJ SOLN
40000.0000 [IU] | Freq: Once | INTRAMUSCULAR | Status: AC
  Administered 2023-03-15: 40000 [IU] via SUBCUTANEOUS
  Filled 2023-03-15: qty 1

## 2023-03-15 MED ORDER — EPOETIN ALFA-EPBX 10000 UNIT/ML IJ SOLN
20000.0000 [IU] | Freq: Once | INTRAMUSCULAR | Status: AC
  Administered 2023-03-15: 20000 [IU] via SUBCUTANEOUS
  Filled 2023-03-15: qty 2

## 2023-03-15 MED ORDER — EPOETIN ALFA-EPBX 10000 UNIT/ML IJ SOLN
20000.0000 [IU] | Freq: Once | INTRAMUSCULAR | Status: DC
  Filled 2023-03-15: qty 2

## 2023-04-24 ENCOUNTER — Other Ambulatory Visit: Payer: Medicare Other

## 2023-04-27 ENCOUNTER — Inpatient Hospital Stay: Payer: Medicare Other | Attending: Oncology

## 2023-04-27 DIAGNOSIS — D631 Anemia in chronic kidney disease: Secondary | ICD-10-CM | POA: Insufficient documentation

## 2023-04-27 DIAGNOSIS — Z7901 Long term (current) use of anticoagulants: Secondary | ICD-10-CM | POA: Insufficient documentation

## 2023-04-27 DIAGNOSIS — C50212 Malignant neoplasm of upper-inner quadrant of left female breast: Secondary | ICD-10-CM | POA: Insufficient documentation

## 2023-04-27 DIAGNOSIS — Z8051 Family history of malignant neoplasm of kidney: Secondary | ICD-10-CM | POA: Diagnosis not present

## 2023-04-27 DIAGNOSIS — Z170 Estrogen receptor positive status [ER+]: Secondary | ICD-10-CM | POA: Insufficient documentation

## 2023-04-27 DIAGNOSIS — Z87891 Personal history of nicotine dependence: Secondary | ICD-10-CM | POA: Diagnosis not present

## 2023-04-27 DIAGNOSIS — Z8042 Family history of malignant neoplasm of prostate: Secondary | ICD-10-CM | POA: Diagnosis not present

## 2023-04-27 DIAGNOSIS — N184 Chronic kidney disease, stage 4 (severe): Secondary | ICD-10-CM | POA: Diagnosis present

## 2023-04-27 DIAGNOSIS — Z800 Family history of malignant neoplasm of digestive organs: Secondary | ICD-10-CM | POA: Insufficient documentation

## 2023-04-27 DIAGNOSIS — E875 Hyperkalemia: Secondary | ICD-10-CM | POA: Insufficient documentation

## 2023-04-27 DIAGNOSIS — Z79899 Other long term (current) drug therapy: Secondary | ICD-10-CM | POA: Insufficient documentation

## 2023-04-27 DIAGNOSIS — Z79811 Long term (current) use of aromatase inhibitors: Secondary | ICD-10-CM | POA: Diagnosis not present

## 2023-04-27 LAB — COMPREHENSIVE METABOLIC PANEL
ALT: 31 U/L (ref 0–44)
AST: 30 U/L (ref 15–41)
Albumin: 4 g/dL (ref 3.5–5.0)
Alkaline Phosphatase: 106 U/L (ref 38–126)
Anion gap: 7 (ref 5–15)
BUN: 66 mg/dL — ABNORMAL HIGH (ref 8–23)
CO2: 22 mmol/L (ref 22–32)
Calcium: 9.7 mg/dL (ref 8.9–10.3)
Chloride: 110 mmol/L (ref 98–111)
Creatinine, Ser: 2.52 mg/dL — ABNORMAL HIGH (ref 0.44–1.00)
GFR, Estimated: 20 mL/min — ABNORMAL LOW (ref >60)
Glucose, Bld: 90 mg/dL (ref 70–99)
Potassium: 5.9 mmol/L — ABNORMAL HIGH (ref 3.5–5.1)
Sodium: 139 mmol/L (ref 135–145)
Total Bilirubin: 0.5 mg/dL (ref 0.3–1.2)
Total Protein: 7.1 g/dL (ref 6.5–8.1)

## 2023-04-27 LAB — IRON AND TIBC
Iron: 48 ug/dL (ref 28–170)
Saturation Ratios: 14 % (ref 10.4–31.8)
TIBC: 344 ug/dL (ref 250–450)
UIBC: 296 ug/dL

## 2023-04-27 LAB — CBC WITH DIFFERENTIAL/PLATELET
Abs Immature Granulocytes: 0.02 10*3/uL (ref 0.00–0.07)
Basophils Absolute: 0 10*3/uL (ref 0.0–0.1)
Basophils Relative: 1 %
Eosinophils Absolute: 0.1 10*3/uL (ref 0.0–0.5)
Eosinophils Relative: 2 %
HCT: 26.6 % — ABNORMAL LOW (ref 36.0–46.0)
Hemoglobin: 8 g/dL — ABNORMAL LOW (ref 12.0–15.0)
Immature Granulocytes: 1 %
Lymphocytes Relative: 11 %
Lymphs Abs: 0.5 10*3/uL — ABNORMAL LOW (ref 0.7–4.0)
MCH: 29.2 pg (ref 26.0–34.0)
MCHC: 30.1 g/dL (ref 30.0–36.0)
MCV: 97.1 fL (ref 80.0–100.0)
Monocytes Absolute: 0.4 10*3/uL (ref 0.1–1.0)
Monocytes Relative: 9 %
Neutro Abs: 3.3 10*3/uL (ref 1.7–7.7)
Neutrophils Relative %: 76 %
Platelets: 143 10*3/uL — ABNORMAL LOW (ref 150–400)
RBC: 2.74 MIL/uL — ABNORMAL LOW (ref 3.87–5.11)
RDW: 15.5 % (ref 11.5–15.5)
WBC: 4.2 10*3/uL (ref 4.0–10.5)
nRBC: 0 % (ref 0.0–0.2)

## 2023-04-27 LAB — FERRITIN: Ferritin: 198 ng/mL (ref 11–307)

## 2023-05-01 ENCOUNTER — Ambulatory Visit: Payer: Medicare Other

## 2023-05-01 ENCOUNTER — Ambulatory Visit: Payer: Medicare Other | Admitting: Oncology

## 2023-05-02 ENCOUNTER — Encounter: Payer: Self-pay | Admitting: Oncology

## 2023-05-02 ENCOUNTER — Inpatient Hospital Stay (HOSPITAL_BASED_OUTPATIENT_CLINIC_OR_DEPARTMENT_OTHER): Payer: Medicare Other | Admitting: Oncology

## 2023-05-02 ENCOUNTER — Inpatient Hospital Stay: Payer: Medicare Other

## 2023-05-02 VITALS — BP 137/63 | HR 80 | Temp 97.3°F | Resp 18

## 2023-05-02 VITALS — BP 137/74 | HR 61

## 2023-05-02 DIAGNOSIS — Z7901 Long term (current) use of anticoagulants: Secondary | ICD-10-CM | POA: Diagnosis not present

## 2023-05-02 DIAGNOSIS — N184 Chronic kidney disease, stage 4 (severe): Secondary | ICD-10-CM

## 2023-05-02 DIAGNOSIS — D631 Anemia in chronic kidney disease: Secondary | ICD-10-CM

## 2023-05-02 DIAGNOSIS — N6099 Unspecified benign mammary dysplasia of unspecified breast: Secondary | ICD-10-CM | POA: Diagnosis not present

## 2023-05-02 DIAGNOSIS — E875 Hyperkalemia: Secondary | ICD-10-CM

## 2023-05-02 DIAGNOSIS — C50919 Malignant neoplasm of unspecified site of unspecified female breast: Secondary | ICD-10-CM

## 2023-05-02 MED ORDER — SODIUM CHLORIDE 0.9 % IV SOLN
200.0000 mg | Freq: Once | INTRAVENOUS | Status: AC
  Administered 2023-05-02: 200 mg via INTRAVENOUS
  Filled 2023-05-02: qty 200

## 2023-05-02 MED ORDER — SODIUM CHLORIDE 0.9 % IV SOLN
INTRAVENOUS | Status: DC
  Filled 2023-05-02: qty 250

## 2023-05-02 NOTE — Progress Notes (Signed)
Patient declined to wait the 30 minutes for post iron infusion observation today. Tolerated infusion well. VSS. 

## 2023-05-02 NOTE — Assessment & Plan Note (Addendum)
Due to CKD. K 5.9, improved comparing to her last level of 6.1 at nephrologist's office.  Low K diet.  Continue follow up with her nephrologist and repeat K levels.

## 2023-05-02 NOTE — Assessment & Plan Note (Signed)
Patient is on aromatase inhibitor.   

## 2023-05-02 NOTE — Assessment & Plan Note (Signed)
Patient is on Coumadin for cardiology etiology. History of GI bleeding due to anticoagulation pain Iron panel appears stable. 

## 2023-05-02 NOTE — Progress Notes (Signed)
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 Oncotype Dx was not sent due to patient being a poor candidate for chemotherapy due to multiple medical problems.She is not interested in chemotherapy as well Patient tolerates Arimidex 1mg  daily  Recommend patient to continue. Recommend DEXA, patient would like to defer at this point. Recommend calcium and vitamin D supplementation   annual diagnostic mammogram-February 2024- results reviewed with patient.   Chronic anticoagulation Patient is on Coumadin for cardiology etiology. History of GI bleeding due to anticoagulation pain Iron panel appears stable.  Anemia of chronic renal failure Hb is trending down, Ferritin <200 Recommend Venofer weekly x 2 Q3 weeks Retacrit 60,000 if hemoglobin is less than 10.   Atypical lobular hyperplasia [facility]) of breast Patient is on aromatase inhibitor.    Hyperkalemia Due to CKD. K 5.9, improved comparing to her last level of 6.1 at nephrologist's office.  Low K diet.  Continue follow up with her nephrologist and repeat K levels.  Orders Placed This Encounter  Procedures   Hemoglobin and Hematocrit, Blood    Standing Status:   Future    Standing Expiration Date:   05/01/2024   Hemoglobin and Hematocrit, Blood    Standing Status:   Future    Standing Expiration Date:   05/01/2024   Hemoglobin and Hematocrit, Blood     Standing Status:   Future    Standing Expiration Date:   05/01/2024   CBC with Differential (Cancer Center Only)    Standing Status:   Future    Standing Expiration Date:   05/01/2024   Iron and TIBC    Standing Status:   Future    Standing Expiration Date:   05/01/2024   Ferritin    Standing Status:   Future    Standing Expiration Date:   05/01/2024   Retic Panel    Standing Status:   Future    Standing Expiration Date:   05/01/2024   Follow up  Lab H&H in 3 weeks, 6 weeks, 9 weeks +/- retacrit  Follow up in 3 months, labs prior to MD +/- Venofer +/- reatcrit  CBC, Iron, TIBC Ferritin, retic panel    All questions were answered. The patient knows to call the clinic with any problems, questions or concerns.  Rickard Patience, MD, PhD [facility] Hematology Oncology 05/02/2023    REASON FOR VISIT Follow up for breast cancer, anemia secondary to chronic kidney disease, chronic anticoagulation  HISTORY OF PRESENTING ILLNESS:  Patient presents for follow-up of breast cancer, anemia secondary to chronic kidney disease, chronic anticoagulation Oncology history summary listed as below. Oncology History  Invasive carcinoma of breast (HCC)  02/08/2022 Mammogram   Unilateral diagnostic mammogram /us 1. Suspicious mass left breast 10:30 o'clock. 2. Anterior to this suspicious mass on the true lateral view is an additional small lobular mass which is not visualized on ultrasound.     02/28/2022 Initial Diagnosis   Invasive carcinoma of breast (  HCC)   03/14/2022 Surgery    patient is status post left breast lumpectomy Pathology showed left mammary carcinoma, no special type, 2 sentinel lymph nodes were excised and both were negative.  Margins are negative for invasive carcinoma. pT1b pN0.  Previous biopsy showed ER positive, PR positive HER2 negative   03/31/2022 Cancer Staging   Staging form: Breast, AJCC 8th Edition - Pathologic stage from 03/31/2022: Stage IA (pT1b, pN0, cM0, G1, ER+, PR+,  HER2-) - Signed by Rickard Patience, MD on 03/31/2022 Stage prefix: Initial diagnosis Multigene prognostic tests performed: None Histologic grading system: 3 grade system   04/25/2022 - 05/24/2022 Radiation Therapy   S/p adjuvant breast radiation    Genetic Testing   Patient declined genetic testing   05/26/2022 -  Anti-estrogen oral therapy   Started Arimidex     INTERVAL HISTORY 72 y.o. female  presents for follow-up for evaluation of breast cancer, anemia due to chronic kidney disease, chronic anticoagulation. Patient currently is on Arimidex 1mg  daily overall tolerates well.  Manageable side effects.   Patient reports feeling well.  No new complaints. + fatigue slightly worse.     Review of Systems  Constitutional:  Positive for malaise/fatigue. Negative for chills, fever and weight loss.  HENT:  Negative for sore throat.   Eyes:  Negative for redness.  Respiratory:  Negative for cough, shortness of breath and wheezing.   Cardiovascular:  Negative for chest pain, palpitations and leg swelling.  Gastrointestinal:  Negative for abdominal pain, blood in stool, nausea and vomiting.  Genitourinary:  Negative for dysuria.  Musculoskeletal:  Negative for myalgias.  Skin:  Negative for rash.  Neurological:  Negative for dizziness, tingling and tremors.  Endo/Heme/Allergies:  Does not bruise/bleed easily.  Psychiatric/Behavioral:  Negative for hallucinations.     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 Jude mechanical valve 09/19/2003   Arthritis    Breast cancer, left (HCC)  02/22/2022   a.) CNB 02/22/2022 (+) for invasive mammary carcinoma; stage IA (cT1b, cN0, cM0, G1, ER+, PR+, HER2-).   Cerebrovascular accident (CVA) due to embolism of other precerebral artery (HCC)    CKD (chronic kidney disease) stage 4, GFR 15-29 ml/min (HCC)    Coronary artery disease    a.) LHC 05/07/2015: 50% RPDA, 50% inf septal, 50% mLCx, 50% pLAD, 40% mRCA; intervention deferred opting for medical mgmt.   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 b.) s/p DCCV 2016 and TEE with cardioversion 09/2018. 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   a.) 28 mm Hemashield like graft    SURGICAL HISTORY: Past Surgical History:  Procedure Laterality Date   ABDOMINAL HYSTERECTOMY     AORTIC VALVE REPLACEMENT N/A 09/19/2003   BREAST BIOPSY Right 01/29/2021   [place] Bx, 6:00 vision clip --> sclerosing intraductal papilloma; (-) for atypia and malignancy   BREAST BIOPSY Right 01/29/2021   [place] Bx, 8:00 Ribbon clip --> usual ductal hyperplasia; (-) for atypia and malignancy   BREAST LUMPECTOMY Left 03/15/2022   CARDIAC CATHETERIZATION N/A 05/07/2015   Procedure: Left Heart Cath;  Surgeon: Laurier Nancy, MD;  Location: [facility] INVASIVE CV LAB;  Service: Cardiovascular;  Laterality: N/A;    COLONOSCOPY Left 02/20/2019   Procedure: COLONOSCOPY;  Surgeon: Pasty Spillers, MD;  Location: [facility] ENDOSCOPY;  Service: Endoscopy;  Laterality: Left;   COLONOSCOPY N/A 02/21/2019   Procedure: COLONOSCOPY;  Surgeon: Pasty Spillers, MD;  Location: [facility] ENDOSCOPY;  Service: Endoscopy;  Laterality: N/A;   ELECTROPHYSIOLOGIC STUDY N/A 05/05/2015   Procedure: Cardioversion;  Surgeon: Laurier Nancy, MD;  Location: [facility] ORS;  Service: Cardiovascular;  Laterality: N/A;   ENTEROSCOPY N/A 02/21/2019   Procedure: ENTEROSCOPY;  Surgeon: Pasty Spillers, MD;  Location: [facility] ENDOSCOPY;  Service: Endoscopy;  Laterality: N/A;   ESOPHAGOGASTRODUODENOSCOPY Left 02/19/2019   Procedure: ESOPHAGOGASTRODUODENOSCOPY (EGD);  Surgeon: Pasty Spillers, MD;  Location: [facility] ENDOSCOPY;  Service: Endoscopy;  Laterality: Left;   EXCISION OF BREAST BIOPSY Right 03/01/2021   Procedure: EXCISION OF BREAST BIOPSY w/ radiofrequency tag;  Surgeon: Carolan Shiver, MD;  Location: [facility] ORS;  Service: General;  Laterality: Right;   PARTIAL MASTECTOMY WITH NEEDLE LOCALIZATION AND AXILLARY SENTINEL LYMPH NODE BX Left 03/14/2022   Procedure: PARTIAL MASTECTOMY WITH Radio Frequency tag AND AXILLARY SENTINEL LYMPH NODE BX;  Surgeon: Carolan Shiver, MD;  Location: [facility] ORS;  Service: General;  Laterality: Left;   REPAIR OF ACUTE ASCENDING THORACIC AORTIC DISSECTION N/A 06/08/2003   Procedure: ASCENDING AORTIC DISSECTION REPAIR AND RESUSPENSION OF AORTIC VALVE (28 mm Hemashield like graft); Location: [facility]; Surgeon: Demetrios Loll, MD   REPLACEMENT TOTAL KNEE BILATERAL     TEE WITH CARDIOVERSION  09/26/2018    SOCIAL HISTORY: Social History   Socioeconomic History   Marital status: Legally Separated    Spouse name: Not on file   Number of children: Not on file   Years of education: Not on file   Highest education level: Not on file  Occupational History   Not on file  Tobacco Use   Smoking  status: Former    Packs/day: 0.50    Years: 20.00    Additional pack years: 0.00    Total pack years: 10.00    Types: Cigarettes    Quit date: 2004    Years since quitting: 20.4   Smokeless tobacco: Never  Vaping Use   Vaping Use: Never used  Substance and Sexual Activity   Alcohol use: Yes    Alcohol/week: 0.0 standard drinks of alcohol    Comment: occas   Drug use: No   Sexual activity: Not on file  Other Topics Concern   Not on file  Social History Narrative   Not on file   Social Determinants of Health  Financial Resource Strain: Not on file  Food Insecurity: Not on file  Transportation Needs: Not on file  Physical Activity: Not on file  Stress: Not on file  Social Connections: Not on file  Intimate Partner Violence: Not on file    FAMILY HISTORY: Family History  Problem Relation Age of Onset   Hypertension Mother    Colon cancer Mother    Prostate cancer Father    Kidney cancer Brother    Prostate cancer Brother    Prostate cancer Brother    Breast cancer Neg Hx     ALLERGIES:  is allergic to atorvastatin.  MEDICATIONS:  Current Outpatient Medications  Medication Sig Dispense Refill   amLODipine (NORVASC) 10 MG tablet Take 10 mg by mouth daily.      anastrozole (ARIMIDEX) 1 MG tablet TAKE 1 TABLET(1 MG) BY MOUTH DAILY 30 tablet 3   benazepril (LOTENSIN) 20 MG tablet Take 20 mg by mouth daily.     diphenhydramine-acetaminophen (TYLENOL PM) 25-500 MG TABS tablet Take 1 tablet by mouth at bedtime as needed.     folic acid (FOLVITE) 1 MG tablet TAKE 1 TABLET BY MOUTH DAILY 30 tablet 10   furosemide (LASIX) 20 MG tablet Take 20 mg by mouth.     pantoprazole (PROTONIX) 20 MG tablet Take 1 tablet (20 mg total) by mouth daily. 30 tablet 2   rosuvastatin (CRESTOR) 5 MG tablet Take 5 mg by mouth at bedtime.  2   sodium bicarbonate 650 MG tablet Take 650 mg by mouth 2 (two) times daily.     spironolactone (ALDACTONE) 25 MG tablet Take 25 mg by mouth daily.      warfarin (COUMADIN) 4 MG tablet Take 4 mg by mouth See admin instructions.     amoxicillin (AMOXIL) 500 MG tablet Take 2,000 mg by mouth See admin instructions. Take 2000 mg by mouth one hour prior to dental procedures (Patient not taking: Reported on 09/28/2022)     No current facility-administered medications for this visit.   Facility-Administered Medications Ordered in Other Visits  Medication Dose Route Frequency Provider Last Rate Last Admin   0.9 %  sodium chloride infusion   Intravenous Continuous Rickard Patience, MD   Stopped at 05/02/23 1434     PHYSICAL EXAMINATION: ECOG PERFORMANCE STATUS: 1 - Symptomatic but completely ambulatory Vitals:   05/02/23 1332  BP: 137/63  Pulse: 80  Resp: 18  Temp: (!) 97.3 F (36.3 C)  SpO2: 100%   There were no vitals filed for this visit.   Physical Exam Constitutional:      General: She is not in acute distress.    Appearance: She is not diaphoretic.  HENT:     Head: Normocephalic and atraumatic.     Nose: Nose normal.     Mouth/Throat:     Pharynx: No oropharyngeal exudate.  Eyes:     General: No scleral icterus.       Left eye: No discharge.     Pupils: Pupils are equal, round, and reactive to light.  Neck:     Vascular: No JVD.  Cardiovascular:     Rate and Rhythm: Normal rate. Rhythm irregular.     Heart sounds: Murmur heard.  Pulmonary:     Effort: Pulmonary effort is normal. No respiratory distress.     Breath sounds: No wheezing or rales.  Chest:     Chest wall: No tenderness.  Abdominal:     General: Bowel sounds are normal. There is no distension.  Palpations: Abdomen is soft. There is no mass.     Tenderness: There is no abdominal tenderness. There is no rebound.  Musculoskeletal:        General: No tenderness. Normal range of motion.     Cervical back: Normal range of motion and neck supple.     Right lower leg: Edema present.     Left lower leg: Edema present.  Lymphadenopathy:     Cervical: No cervical  adenopathy.  Skin:    General: Skin is warm and dry.     Findings: No erythema or rash.  Neurological:     Mental Status: She is alert and oriented to person, place, and time.     Cranial Nerves: No cranial nerve deficit.     Motor: No abnormal muscle tone.     Coordination: Coordination normal.  Psychiatric:        Mood and Affect: Mood and affect normal.     LABORATORY DATA:  I have reviewed the data as listed    Latest Ref Rng & Units 04/27/2023    1:37 PM 03/15/2023    2:17 PM 02/15/2023    1:54 PM  CBC  WBC 4.0 - 10.5 K/uL 4.2     Hemoglobin 12.0 - 15.0 g/dL 8.0  8.9  8.7   Hematocrit 36.0 - 46.0 % 26.6  30.0  30.0   Platelets 150 - 400 K/uL 143         Latest Ref Rng & Units 04/27/2023    1:37 PM 09/28/2022    1:49 PM 05/09/2022    1:51 PM  CMP  Glucose 70 - 99 mg/dL 90  92  161   BUN 8 - 23 mg/dL 66  32  66   Creatinine 0.44 - 1.00 mg/dL 0.96  0.45  4.09   Sodium 135 - 145 mmol/L 139  142  140   Potassium 3.5 - 5.1 mmol/L 5.9  3.7  4.1   Chloride 98 - 111 mmol/L 110  110  106   CO2 22 - 32 mmol/L 22  25  24    Calcium 8.9 - 10.3 mg/dL 9.7  9.7  9.9   Total Protein 6.5 - 8.1 g/dL 7.1  7.0  7.3   Total Bilirubin 0.3 - 1.2 mg/dL 0.5  0.5  0.5   Alkaline Phos 38 - 126 U/L 106  126  76   AST 15 - 41 U/L 30  16  15    ALT 0 - 44 U/L 31  18  11       Iron/TIBC/Ferritin/ %Sat    Component Value Date/Time   IRON 48 04/27/2023 1337   TIBC 344 04/27/2023 1337   FERRITIN 198 04/27/2023 1337   IRONPCTSAT 14 04/27/2023 1337  RADIOGRAPHIC STUDIES: I have personally reviewed the radiological images as listed and agreed with the findings in the report. No results found.

## 2023-05-02 NOTE — Assessment & Plan Note (Addendum)
Hb is trending down, Ferritin <200 Recommend Venofer weekly x 2 Q3 weeks Retacrit 60,000 if hemoglobin is less than 10.

## 2023-05-02 NOTE — Assessment & Plan Note (Addendum)
Left breast cancer, stage IA, pT1b N0 ER/PR positive, HER2 negative. Oncotype Dx was not sent due to patient being a poor candidate for chemotherapy due to multiple medical problems.She is not interested in chemotherapy as well Patient tolerates Arimidex 1mg  daily  Recommend patient to continue. Recommend DEXA, patient would like to defer at this point. Recommend calcium and vitamin D supplementation   annual diagnostic mammogram-February 2024- results reviewed with patient.

## 2023-05-04 ENCOUNTER — Inpatient Hospital Stay: Payer: Medicare Other

## 2023-05-04 VITALS — BP 125/75

## 2023-05-04 DIAGNOSIS — D631 Anemia in chronic kidney disease: Secondary | ICD-10-CM

## 2023-05-04 DIAGNOSIS — N184 Chronic kidney disease, stage 4 (severe): Secondary | ICD-10-CM | POA: Diagnosis not present

## 2023-05-04 MED ORDER — EPOETIN ALFA-EPBX 20000 UNIT/ML IJ SOLN
20000.0000 [IU] | Freq: Once | INTRAMUSCULAR | Status: AC
  Administered 2023-05-04: 20000 [IU] via SUBCUTANEOUS
  Filled 2023-05-04: qty 1

## 2023-05-10 MED FILL — Iron Sucrose Inj 20 MG/ML (Fe Equiv): INTRAVENOUS | Qty: 10 | Status: AC

## 2023-05-11 ENCOUNTER — Inpatient Hospital Stay: Payer: Medicare Other

## 2023-05-11 VITALS — BP 135/81 | HR 59 | Temp 97.8°F | Resp 16

## 2023-05-11 DIAGNOSIS — N184 Chronic kidney disease, stage 4 (severe): Secondary | ICD-10-CM | POA: Diagnosis not present

## 2023-05-11 DIAGNOSIS — D631 Anemia in chronic kidney disease: Secondary | ICD-10-CM

## 2023-05-11 MED ORDER — SODIUM CHLORIDE 0.9 % IV SOLN
200.0000 mg | Freq: Once | INTRAVENOUS | Status: AC
  Administered 2023-05-11: 200 mg via INTRAVENOUS
  Filled 2023-05-11: qty 200

## 2023-05-11 MED ORDER — SODIUM CHLORIDE 0.9 % IV SOLN
INTRAVENOUS | Status: DC
  Filled 2023-05-11: qty 250

## 2023-05-23 ENCOUNTER — Inpatient Hospital Stay: Payer: Medicare Other

## 2023-05-23 ENCOUNTER — Other Ambulatory Visit: Payer: Medicare Other

## 2023-05-23 ENCOUNTER — Inpatient Hospital Stay: Payer: Medicare Other | Attending: Oncology

## 2023-05-23 VITALS — BP 133/63 | HR 63 | Temp 96.3°F | Resp 18

## 2023-05-23 DIAGNOSIS — D631 Anemia in chronic kidney disease: Secondary | ICD-10-CM | POA: Diagnosis present

## 2023-05-23 DIAGNOSIS — N184 Chronic kidney disease, stage 4 (severe): Secondary | ICD-10-CM | POA: Diagnosis present

## 2023-05-23 DIAGNOSIS — C50919 Malignant neoplasm of unspecified site of unspecified female breast: Secondary | ICD-10-CM

## 2023-05-23 LAB — HEMOGLOBIN AND HEMATOCRIT, BLOOD
HCT: 23.7 % — ABNORMAL LOW (ref 36.0–46.0)
Hemoglobin: 7 g/dL — ABNORMAL LOW (ref 12.0–15.0)

## 2023-05-23 MED ORDER — EPOETIN ALFA-EPBX 20000 UNIT/ML IJ SOLN
20000.0000 [IU] | Freq: Once | INTRAMUSCULAR | Status: AC
  Administered 2023-05-23: 20000 [IU] via SUBCUTANEOUS
  Filled 2023-05-23: qty 1

## 2023-05-23 MED ORDER — EPOETIN ALFA-EPBX 40000 UNIT/ML IJ SOLN
40000.0000 [IU] | Freq: Once | INTRAMUSCULAR | Status: AC
  Administered 2023-05-23: 40000 [IU] via SUBCUTANEOUS
  Filled 2023-05-23: qty 1

## 2023-05-23 NOTE — Progress Notes (Signed)
1030 am - Dr. Cathie Hoops and her team notified that patient's hgb has dropped to 7.0. pt c/o of shortness of breath and weakness. Pt denies any acute blood loss-gi bleed or nose bleeds or bleeding from urine/bruising.  Bp 133/63.  I reached out to Dr. Bethanne Ginger team to determine if any other apts were necessary for lab recheck or additional studies or iron infusion.  Jeanette Sannes RN reviewed chart w/Dr. Cathie Hoops- patient received iron infusion on 5/21 and 5/30; only 20,000 units of retacrit. Dose ordered was retacrit 60,000 units in total. Per Dr. Cathie Hoops- Provider gave v/o to proceed with 60,000 Units of retacrit today as scheduled. No additional venofer infusions needed at this time.  Pt will need a lab/retacrit apt in 1 week. Provider instructed RN not to give retacrit dosing next week until labs are personally reviewed by Dr. Cathie Hoops. She will determine if any additional retacrits are needed at this time. Patient informed that on 5/23 that she only received partial dosing. Pt stated, "the person that gave me the injection last time only gave me one vial. This would make sense why my hgb dropped." Pt gave verbal understanding of the plan. New apts given to patient for 1 week for recheck labs and possible retacrit.

## 2023-05-24 ENCOUNTER — Other Ambulatory Visit: Payer: Self-pay | Admitting: Oncology

## 2023-05-25 ENCOUNTER — Encounter: Payer: Self-pay | Admitting: Oncology

## 2023-05-30 ENCOUNTER — Inpatient Hospital Stay: Payer: Medicare Other

## 2023-05-30 ENCOUNTER — Telehealth: Payer: Self-pay | Admitting: *Deleted

## 2023-05-30 DIAGNOSIS — N184 Chronic kidney disease, stage 4 (severe): Secondary | ICD-10-CM

## 2023-05-30 NOTE — Telephone Encounter (Signed)
Dr. Cathie Hoops- FYI - Pt did not show up for her labs/retacrit today. Reviewed chart. Pt at [facility]- admission for chest pain/afib. She has rcvd 1 unit of blood per chart on 6/14. Next apt is on 7/2 for lab recheck.

## 2023-06-07 NOTE — Telephone Encounter (Signed)
Per patient- pcp will draw hgb tom 6/27

## 2023-06-08 NOTE — Addendum Note (Signed)
Addended by: Keitha Butte on: 06/08/2023 03:55 PM   Modules accepted: Orders

## 2023-06-08 NOTE — Telephone Encounter (Signed)
Dr. Cathie Hoops- pt's hgb was drawn today at [facility]-   HGB- 7.0  She is scheduled to come on 7/2 for labs/retacrit. Do you need pt to come sooner?

## 2023-06-08 NOTE — Telephone Encounter (Addendum)
RN spoke with Dr. Cathie Hoops. Given hgb is still low at 7.0. Dr. Cathie Hoops would like pt to be set up for IV venofer tomorrow. I spoke with Infusion charge-Ally and scheduling team. Pt can be added to schedule at 330 tomorrow 6/27.  Per Dr. Cathie Hoops, pt will need to keep apts as scheduled on 7/2 for possible retacrit. Patient will need the 60,000 units of retacrit. RN will need to add a hold tube to the scheduled labs in case pt requires transfusion.  Msg sent to scheduling to arrange for additional lab/retacrit apts for 7/9 and 7/16   Pt to see Dr. Cathie Hoops as scheduled on 7/23  Patient agreeable to plan. She will come at 330 tomorrow for her iron infusion. She understands that scheduling will be arranging additional apts for the weekly lab/retacrit on 7/9 and 7/16. Provider does want RNs/CMA to check with her on the dosing of retacrit on 7/9 and 7/16 prior to admin of retacrit as her dose could change.

## 2023-06-09 ENCOUNTER — Inpatient Hospital Stay: Payer: Medicare Other

## 2023-06-09 VITALS — BP 113/61 | HR 68 | Temp 97.4°F | Resp 18

## 2023-06-09 DIAGNOSIS — N184 Chronic kidney disease, stage 4 (severe): Secondary | ICD-10-CM | POA: Diagnosis not present

## 2023-06-09 DIAGNOSIS — D631 Anemia in chronic kidney disease: Secondary | ICD-10-CM

## 2023-06-09 MED ORDER — SODIUM CHLORIDE 0.9 % IV SOLN
INTRAVENOUS | Status: DC
  Filled 2023-06-09: qty 250

## 2023-06-09 MED ORDER — SODIUM CHLORIDE 0.9 % IV SOLN
200.0000 mg | Freq: Once | INTRAVENOUS | Status: AC
  Administered 2023-06-09: 200 mg via INTRAVENOUS
  Filled 2023-06-09: qty 200

## 2023-06-09 NOTE — Patient Instructions (Signed)
[facility] [place] [facility]  Discharge Instructions: Thank you for choosing [facility] to provide your oncology and hematology care.  If you have a lab appointment with the Cancer Center, please go directly to the Cancer Center and check in at the registration area.  Wear comfortable clothing and clothing appropriate for easy access to any Portacath or PICC line.   We strive to give you quality time with your provider. You may need to reschedule your appointment if you arrive late (15 or more minutes).  Arriving late affects you and other patients whose appointments are after yours.  Also, if you miss three or more appointments without notifying the office, you may be dismissed from the clinic at the provider's discretion.      For prescription refill requests, have your pharmacy contact our office and allow 72 hours for refills to be completed.    Today you received the following chemotherapy and/or immunotherapy agents Venofer.      To help prevent nausea and vomiting after your treatment, we encourage you to take your nausea medication as directed.  BELOW ARE SYMPTOMS THAT SHOULD BE REPORTED IMMEDIATELY: *FEVER GREATER THAN 100.4 F (38 C) OR HIGHER *CHILLS OR SWEATING *NAUSEA AND VOMITING THAT IS NOT CONTROLLED WITH YOUR NAUSEA MEDICATION *UNUSUAL SHORTNESS OF BREATH *UNUSUAL BRUISING OR BLEEDING *URINARY PROBLEMS (pain or burning when urinating, or frequent urination) *BOWEL PROBLEMS (unusual diarrhea, constipation, pain near the anus) TENDERNESS IN MOUTH AND THROAT WITH OR WITHOUT PRESENCE OF ULCERS (sore throat, sores in mouth, or a toothache) UNUSUAL RASH, SWELLING OR PAIN  UNUSUAL VAGINAL DISCHARGE OR ITCHING   Items with * indicate a potential emergency and should be followed up as soon as possible or go to the Emergency Department if any problems should occur.  Please show the CHEMOTHERAPY ALERT CARD or IMMUNOTHERAPY ALERT CARD at check-in to  the Emergency Department and triage nurse.  Should you have questions after your visit or need to cancel or reschedule your appointment, please contact [facility] [place] [facility]  [phone] and follow the prompts.  Office hours are 8:00 a.m. to 4:30 p.m. Monday - Friday. Please note that voicemails left after 4:00 p.m. may not be returned until the following business day.  We are closed weekends and major holidays. You have access to a nurse at all times for urgent questions. Please call the main number to the clinic [phone] and follow the prompts.  For any non-urgent questions, you may also contact your provider using MyChart. We now offer e-Visits for anyone 18 and older to request care online for non-urgent symptoms. For details visit [email or web address].   Also download the MyChart app! Go to the app store, search "MyChart", open the app, select [facility], and log in with your MyChart username and password.   

## 2023-06-12 ENCOUNTER — Ambulatory Visit: Payer: Medicare Other | Admitting: Radiation Oncology

## 2023-06-12 ENCOUNTER — Other Ambulatory Visit: Payer: Self-pay | Admitting: *Deleted

## 2023-06-12 MED ORDER — ANASTROZOLE 1 MG PO TABS: ORAL_TABLET | ORAL | 3 refills | Status: DC

## 2023-06-12 MED FILL — Iron Sucrose Inj 20 MG/ML (Fe Equiv): INTRAVENOUS | Qty: 10 | Status: AC

## 2023-06-13 ENCOUNTER — Inpatient Hospital Stay: Payer: Medicare Other

## 2023-06-13 ENCOUNTER — Inpatient Hospital Stay: Payer: Medicare Other | Attending: Oncology

## 2023-06-13 VITALS — BP 142/91 | HR 74

## 2023-06-13 DIAGNOSIS — N184 Chronic kidney disease, stage 4 (severe): Secondary | ICD-10-CM

## 2023-06-13 DIAGNOSIS — D631 Anemia in chronic kidney disease: Secondary | ICD-10-CM | POA: Diagnosis present

## 2023-06-13 LAB — SAMPLE TO BLOOD BANK

## 2023-06-13 LAB — HEMOGLOBIN AND HEMATOCRIT (CANCER CENTER ONLY)
HCT: 25.4 % — ABNORMAL LOW (ref 36.0–46.0)
Hemoglobin: 7.5 g/dL — ABNORMAL LOW (ref 12.0–15.0)

## 2023-06-13 MED ORDER — EPOETIN ALFA-EPBX 40000 UNIT/ML IJ SOLN
40000.0000 [IU] | Freq: Once | INTRAMUSCULAR | Status: AC
  Administered 2023-06-13: 40000 [IU] via SUBCUTANEOUS
  Filled 2023-06-13: qty 1

## 2023-06-13 MED ORDER — EPOETIN ALFA-EPBX 20000 UNIT/ML IJ SOLN
20000.0000 [IU] | Freq: Once | INTRAMUSCULAR | Status: AC
  Administered 2023-06-13: 20000 [IU] via SUBCUTANEOUS
  Filled 2023-06-13: qty 1

## 2023-06-20 ENCOUNTER — Inpatient Hospital Stay: Payer: Medicare Other

## 2023-06-20 ENCOUNTER — Other Ambulatory Visit: Payer: Self-pay

## 2023-06-20 ENCOUNTER — Other Ambulatory Visit: Payer: Self-pay | Admitting: Oncology

## 2023-06-20 VITALS — BP 136/81 | HR 61 | Temp 97.2°F | Resp 17

## 2023-06-20 DIAGNOSIS — N184 Chronic kidney disease, stage 4 (severe): Secondary | ICD-10-CM

## 2023-06-20 DIAGNOSIS — C50919 Malignant neoplasm of unspecified site of unspecified female breast: Secondary | ICD-10-CM

## 2023-06-20 LAB — SAMPLE TO BLOOD BANK

## 2023-06-20 LAB — HEMOGLOBIN AND HEMATOCRIT, BLOOD
HCT: 32.5 % — ABNORMAL LOW (ref 36.0–46.0)
Hemoglobin: 9.5 g/dL — ABNORMAL LOW (ref 12.0–15.0)

## 2023-06-20 MED ORDER — EPOETIN ALFA-EPBX 20000 UNIT/ML IJ SOLN
20000.0000 [IU] | Freq: Once | INTRAMUSCULAR | Status: AC
  Administered 2023-06-20: 20000 [IU] via SUBCUTANEOUS
  Filled 2023-06-20: qty 1

## 2023-06-20 NOTE — Progress Notes (Signed)
Confirmed retracit dose today, Per. Dr. Cathie Hoops "please give 20,000 today, cancel 7/16, 7/23 lab and retacrit . keep 7/30 appt 7/30 she will receive 60,000 her usual dose"

## 2023-06-22 ENCOUNTER — Encounter: Payer: Self-pay | Admitting: Radiation Oncology

## 2023-06-22 ENCOUNTER — Ambulatory Visit
Admission: RE | Admit: 2023-06-22 | Discharge: 2023-06-22 | Disposition: A | Discharge status: Home / Self Care | Payer: Medicare Other | Source: Ambulatory Visit | Attending: Radiation Oncology | Admitting: Radiation Oncology

## 2023-06-22 VITALS — BP 134/74 | HR 61 | Temp 96.1°F | Resp 16 | Ht 60.0 in | Wt 145.0 lb

## 2023-06-22 DIAGNOSIS — Z170 Estrogen receptor positive status [ER+]: Secondary | ICD-10-CM | POA: Insufficient documentation

## 2023-06-22 DIAGNOSIS — C50912 Malignant neoplasm of unspecified site of left female breast: Secondary | ICD-10-CM | POA: Diagnosis present

## 2023-06-22 DIAGNOSIS — Z923 Personal history of irradiation: Secondary | ICD-10-CM | POA: Insufficient documentation

## 2023-06-22 DIAGNOSIS — C50919 Malignant neoplasm of unspecified site of unspecified female breast: Secondary | ICD-10-CM

## 2023-06-22 DIAGNOSIS — Z79811 Long term (current) use of aromatase inhibitors: Secondary | ICD-10-CM | POA: Diagnosis not present
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
## 2023-06-27 ENCOUNTER — Other Ambulatory Visit: Payer: Medicare Other

## 2023-06-27 ENCOUNTER — Ambulatory Visit: Payer: Medicare Other

## 2023-07-04 ENCOUNTER — Other Ambulatory Visit: Payer: Medicare Other

## 2023-07-04 ENCOUNTER — Ambulatory Visit: Payer: Medicare Other

## 2023-07-04 ENCOUNTER — Ambulatory Visit: Payer: Medicare Other | Admitting: Oncology

## 2023-07-11 ENCOUNTER — Inpatient Hospital Stay: Payer: Medicare Other

## 2023-07-11 VITALS — BP 124/78

## 2023-07-11 DIAGNOSIS — N184 Chronic kidney disease, stage 4 (severe): Secondary | ICD-10-CM | POA: Diagnosis not present

## 2023-07-11 DIAGNOSIS — C50919 Malignant neoplasm of unspecified site of unspecified female breast: Secondary | ICD-10-CM

## 2023-07-11 DIAGNOSIS — D631 Anemia in chronic kidney disease: Secondary | ICD-10-CM

## 2023-07-11 LAB — SAMPLE TO BLOOD BANK

## 2023-07-11 LAB — HEMOGLOBIN AND HEMATOCRIT, BLOOD
HCT: 30.8 % — ABNORMAL LOW (ref 36.0–46.0)
Hemoglobin: 9.2 g/dL — ABNORMAL LOW (ref 12.0–15.0)

## 2023-07-11 MED ORDER — EPOETIN ALFA-EPBX 40000 UNIT/ML IJ SOLN
40000.0000 [IU] | Freq: Once | INTRAMUSCULAR | Status: AC
  Administered 2023-07-11: 40000 [IU] via SUBCUTANEOUS
  Filled 2023-07-11: qty 1

## 2023-07-11 MED ORDER — EPOETIN ALFA-EPBX 20000 UNIT/ML IJ SOLN
20000.0000 [IU] | Freq: Once | INTRAMUSCULAR | Status: AC
  Administered 2023-07-11: 20000 [IU] via SUBCUTANEOUS
  Filled 2023-07-11: qty 1

## 2023-08-01 ENCOUNTER — Other Ambulatory Visit: Payer: Self-pay | Admitting: Oncology

## 2023-08-03 ENCOUNTER — Other Ambulatory Visit: Payer: Medicare Other

## 2023-08-03 ENCOUNTER — Ambulatory Visit: Payer: Medicare Other

## 2023-08-03 ENCOUNTER — Ambulatory Visit: Payer: Medicare Other | Admitting: Oncology

## 2023-08-08 ENCOUNTER — Encounter: Payer: Self-pay | Admitting: Oncology

## 2023-08-08 ENCOUNTER — Inpatient Hospital Stay: Payer: Medicare Other

## 2023-08-08 ENCOUNTER — Inpatient Hospital Stay: Payer: Medicare Other | Attending: Oncology | Admitting: Oncology

## 2023-08-08 VITALS — BP 127/71 | HR 69 | Temp 97.3°F | Resp 18 | Wt 151.0 lb

## 2023-08-08 DIAGNOSIS — D631 Anemia in chronic kidney disease: Secondary | ICD-10-CM

## 2023-08-08 DIAGNOSIS — Z79811 Long term (current) use of aromatase inhibitors: Secondary | ICD-10-CM | POA: Diagnosis not present

## 2023-08-08 DIAGNOSIS — Z9012 Acquired absence of left breast and nipple: Secondary | ICD-10-CM | POA: Diagnosis not present

## 2023-08-08 DIAGNOSIS — C50212 Malignant neoplasm of upper-inner quadrant of left female breast: Secondary | ICD-10-CM | POA: Insufficient documentation

## 2023-08-08 DIAGNOSIS — C50919 Malignant neoplasm of unspecified site of unspecified female breast: Secondary | ICD-10-CM

## 2023-08-08 DIAGNOSIS — N184 Chronic kidney disease, stage 4 (severe): Secondary | ICD-10-CM

## 2023-08-08 DIAGNOSIS — N6099 Unspecified benign mammary dysplasia of unspecified breast: Secondary | ICD-10-CM

## 2023-08-08 DIAGNOSIS — Z7901 Long term (current) use of anticoagulants: Secondary | ICD-10-CM

## 2023-08-08 LAB — CBC WITH DIFFERENTIAL (CANCER CENTER ONLY)
Abs Immature Granulocytes: 0.02 10*3/uL (ref 0.00–0.07)
Basophils Absolute: 0 10*3/uL (ref 0.0–0.1)
Basophils Relative: 1 %
Eosinophils Absolute: 0.1 10*3/uL (ref 0.0–0.5)
Eosinophils Relative: 2 %
HCT: 29.4 % — ABNORMAL LOW (ref 36.0–46.0)
Hemoglobin: 8.7 g/dL — ABNORMAL LOW (ref 12.0–15.0)
Immature Granulocytes: 1 %
Lymphocytes Relative: 9 %
Lymphs Abs: 0.4 10*3/uL — ABNORMAL LOW (ref 0.7–4.0)
MCH: 28.2 pg (ref 26.0–34.0)
MCHC: 29.6 g/dL — ABNORMAL LOW (ref 30.0–36.0)
MCV: 95.5 fL (ref 80.0–100.0)
Monocytes Absolute: 0.4 10*3/uL (ref 0.1–1.0)
Monocytes Relative: 10 %
Neutro Abs: 3.2 10*3/uL (ref 1.7–7.7)
Neutrophils Relative %: 77 %
Platelet Count: 163 10*3/uL (ref 150–400)
RBC: 3.08 MIL/uL — ABNORMAL LOW (ref 3.87–5.11)
RDW: 15.7 % — ABNORMAL HIGH (ref 11.5–15.5)
WBC Count: 4.1 10*3/uL (ref 4.0–10.5)
nRBC: 0 % (ref 0.0–0.2)

## 2023-08-08 LAB — RETIC PANEL
Immature Retic Fract: 12.5 % (ref 2.3–15.9)
RBC.: 3.13 MIL/uL — ABNORMAL LOW (ref 3.87–5.11)
Retic Count, Absolute: 46.3 10*3/uL (ref 19.0–186.0)
Retic Ct Pct: 1.5 % (ref 0.4–3.1)
Reticulocyte Hemoglobin: 28.2 pg (ref >27.9)

## 2023-08-08 LAB — FERRITIN: Ferritin: 145 ng/mL (ref 11–307)

## 2023-08-08 LAB — IRON AND TIBC
Iron: 38 ug/dL (ref 28–170)
Saturation Ratios: 12 % (ref 10.4–31.8)
TIBC: 322 ug/dL (ref 250–450)
UIBC: 284 ug/dL

## 2023-08-08 LAB — SAMPLE TO BLOOD BANK

## 2023-08-08 MED ORDER — EPOETIN ALFA-EPBX 40000 UNIT/ML IJ SOLN
40000.0000 [IU] | Freq: Once | INTRAMUSCULAR | Status: AC
  Administered 2023-08-08: 40000 [IU] via SUBCUTANEOUS
  Filled 2023-08-08: qty 1

## 2023-08-08 MED ORDER — EPOETIN ALFA-EPBX 20000 UNIT/ML IJ SOLN
20000.0000 [IU] | Freq: Once | INTRAMUSCULAR | Status: AC
  Administered 2023-08-08: 20000 [IU] via SUBCUTANEOUS
  Filled 2023-08-08: qty 1

## 2023-08-08 NOTE — Assessment & Plan Note (Signed)
Hb is trending down, Ferritin <200 Recommend Venofer weekly x 2 Q3 weeks Retacrit 60,000 if hemoglobin is less than 10.

## 2023-08-08 NOTE — Progress Notes (Signed)
Hematology/Oncology Progress note [phone] [phone]      Patient Care Team: Marisue Ivan, MD as PCP - General (Family Medicine) Rickard Patience, MD as Consulting Physician (Hematology and Oncology) Scarlett Presto, RN (Inactive) as Oncology Nurse Navigator  ASSESSMENT & PLAN:   Cancer Staging  Invasive carcinoma of breast [facility]) Staging form: Breast, AJCC 8th Edition - Pathologic stage from 03/31/2022: Stage IA (pT1b, pN0, cM0, G1, ER+, PR+, HER2-) - Signed by Rickard Patience, MD on 03/31/2022   Invasive carcinoma of breast (HCC) Left breast cancer, stage IA, pT1b N0 ER/PR positive, HER2 negative. Oncotype Dx was not sent due to patient being a poor candidate for chemotherapy due to multiple medical problems.She is not interested in chemotherapy as well Labs are reviewed and discussed with patient. Continue Arimidex 1mg  daily  Patient declines DEXA. Recommend calcium and vitamin D supplementation  Obtain annual diagnostic mammogram-February 2025  Chronic anticoagulation Patient is on Coumadin for cardiology etiology. History of GI bleeding due to anticoagulation pain Ferritin is <200, will arrange her to get additional Venfoer treatments  Anemia of chronic renal failure Hb is trending down, Ferritin <200 Recommend Venofer weekly x 2 Q3 weeks Retacrit 60,000 if hemoglobin is less than 10.   Atypical lobular hyperplasia [facility]) of breast Patient is on aromatase inhibitor.   Orders Placed This Encounter  Procedures   MM 3D DIAGNOSTIC MAMMOGRAM BILATERAL BREAST    Standing Status:   Future    Standing Expiration Date:   08/07/2024    Order Specific Question:   Reason for Exam (SYMPTOM  OR DIAGNOSIS REQUIRED)    Answer:   Breast cancer    Order Specific Question:   Preferred imaging location?    Answer:   [facility]   [place] LIMITED ULTRASOUND INCLUDING AXILLA RIGHT BREAST    Standing Status:   Future    Standing Expiration Date:   08/07/2024    Order  Specific Question:   Reason for Exam (SYMPTOM  OR DIAGNOSIS REQUIRED)    Answer:   Breast cancer    Order Specific Question:   Preferred imaging location?    Answer:   [facility]   [place] LIMITED ULTRASOUND INCLUDING AXILLA LEFT BREAST     Standing Status:   Future    Standing Expiration Date:   08/07/2024    Order Specific Question:   Reason for Exam (SYMPTOM  OR DIAGNOSIS REQUIRED)    Answer:   Breast cancer    Order Specific Question:   Preferred imaging location?    Answer:   [facility]   Hemoglobin and Hematocrit, Blood    Standing Status:   Future    Standing Expiration Date:   08/07/2024   Hemoglobin and Hematocrit, Blood    Standing Status:   Future    Standing Expiration Date:   08/07/2024   Hemoglobin and Hematocrit, Blood    Standing Status:   Future    Standing Expiration Date:   08/07/2024   CBC with Differential (Cancer Center Only)    Standing Status:   Future    Standing Expiration Date:   08/07/2024   Iron and TIBC    Standing Status:   Future    Standing Expiration Date:   08/07/2024   Ferritin    Standing Status:   Future    Standing Expiration Date:   08/07/2024   Retic Panel    Standing Status:   Future    Standing Expiration Date:   08/07/2024   Sample  to Blood Bank    Standing Status:   Future    Standing Expiration Date:   08/07/2024   Follow up  Lab H&H in 3 weeks, 6 weeks, 9 weeks +/- retacrit  Follow up in 3 months, labs prior to MD +/- Venofer +/- reatcrit  CBC, Iron, TIBC Ferritin, retic panel   All questions were answered. The patient knows to call the clinic with any problems, questions or concerns.  Rickard Patience, MD, PhD [facility] Hematology Oncology 08/08/2023    REASON FOR VISIT Follow up for breast cancer, anemia secondary to chronic kidney disease, chronic anticoagulation  HISTORY OF PRESENTING ILLNESS:  Patient presents for follow-up of breast cancer, anemia secondary to chronic kidney disease, chronic anticoagulation Oncology  history summary listed as below. Oncology History  Invasive carcinoma of breast (HCC)  02/08/2022 Mammogram   Unilateral diagnostic mammogram /us 1. Suspicious mass left breast 10:30 o'clock. 2. Anterior to this suspicious mass on the true lateral view is an additional small lobular mass which is not visualized on ultrasound.     02/28/2022 Initial Diagnosis   Invasive carcinoma of breast (HCC)   03/14/2022 Surgery    patient is status post left breast lumpectomy Pathology showed left mammary carcinoma, no special type, 2 sentinel lymph nodes were excised and both were negative.  Margins are negative for invasive carcinoma. pT1b pN0.  Previous biopsy showed ER positive, PR positive HER2 negative   03/31/2022 Cancer Staging   Staging form: Breast, AJCC 8th Edition - Pathologic stage from 03/31/2022: Stage IA (pT1b, pN0, cM0, G1, ER+, PR+, HER2-) - Signed by Rickard Patience, MD on 03/31/2022 Stage prefix: Initial diagnosis Multigene prognostic tests performed: None Histologic grading system: 3 grade system   04/25/2022 - 05/24/2022 Radiation Therapy   S/p adjuvant breast radiation    Genetic Testing   Patient declined genetic testing   05/26/2022 -  Anti-estrogen oral therapy   Started Arimidex     INTERVAL HISTORY 72 y.o. female  presents for follow-up for evaluation of breast cancer, anemia due to chronic kidney disease, chronic anticoagulation. Patient currently is on Arimidex 1mg  daily overall tolerates well.  Manageable side effects.   Patient reports feeling well.  No new complaints. + fatigue stable    Review of Systems  Constitutional:  Positive for malaise/fatigue. Negative for chills, fever and weight loss.  HENT:  Negative for sore throat.   Eyes:  Negative for redness.  Respiratory:  Negative for cough, shortness of breath and wheezing.   Cardiovascular:  Negative for chest pain, palpitations and leg swelling.  Gastrointestinal:  Negative for abdominal pain, blood in stool,  nausea and vomiting.  Genitourinary:  Negative for dysuria.  Musculoskeletal:  Negative for myalgias.  Skin:  Negative for rash.  Neurological:  Negative for dizziness, tingling and tremors.  Endo/Heme/Allergies:  Does not bruise/bleed easily.  Psychiatric/Behavioral:  Negative for hallucinations.     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 Jude mechanical valve 09/19/2003   Arthritis    Breast  cancer, left (HCC) 02/22/2022   a.) CNB 02/22/2022 (+) for invasive mammary carcinoma; stage IA (cT1b, cN0, cM0, G1, ER+, PR+, HER2-).   Cerebrovascular accident (CVA) due to embolism of other precerebral artery (HCC)    CKD (chronic kidney disease) stage 4, GFR 15-29 ml/min (HCC)    Coronary artery disease    a.) LHC 05/07/2015: 50% RPDA, 50% inf septal, 50% mLCx, 50% pLAD, 40% mRCA; intervention deferred opting for medical mgmt.   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 b.) s/p DCCV 2016 and TEE with cardioversion 09/2018. 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   a.) 28 mm Hemashield like graft    SURGICAL HISTORY: Past Surgical History:  Procedure Laterality Date   ABDOMINAL HYSTERECTOMY     AORTIC VALVE REPLACEMENT N/A 09/19/2003   BREAST BIOPSY Right 01/29/2021   [place] Bx, 6:00 vision clip --> sclerosing intraductal papilloma; (-) for atypia and malignancy   BREAST BIOPSY Right 01/29/2021   [place] Bx, 8:00 Ribbon clip --> usual ductal hyperplasia; (-) for atypia and malignancy   BREAST LUMPECTOMY Left 03/15/2022   CARDIAC CATHETERIZATION N/A 05/07/2015   Procedure: Left Heart Cath;  Surgeon: Laurier Nancy, MD;  Location: [facility] INVASIVE CV LAB;  Service: Cardiovascular;  Laterality: N/A;   COLONOSCOPY Left 02/20/2019   Procedure: COLONOSCOPY;  Surgeon: Pasty Spillers, MD;  Location: [facility] ENDOSCOPY;  Service: Endoscopy;  Laterality: Left;   COLONOSCOPY N/A 02/21/2019   Procedure: COLONOSCOPY;  Surgeon: Pasty Spillers, MD;  Location: [facility] ENDOSCOPY;  Service: Endoscopy;  Laterality: N/A;   ELECTROPHYSIOLOGIC STUDY N/A 05/05/2015   Procedure: Cardioversion;  Surgeon: Laurier Nancy, MD;  Location: [facility] ORS;  Service: Cardiovascular;  Laterality: N/A;   ENTEROSCOPY N/A 02/21/2019   Procedure: ENTEROSCOPY;  Surgeon: Pasty Spillers, MD;  Location: [facility] ENDOSCOPY;  Service: Endoscopy;  Laterality: N/A;   ESOPHAGOGASTRODUODENOSCOPY Left 02/19/2019   Procedure: ESOPHAGOGASTRODUODENOSCOPY (EGD);  Surgeon: Pasty Spillers, MD;  Location[facility] ENDOSCOPY;  Service: Endoscopy;  Laterality: Left;   EXCISION OF BREAST BIOPSY Right 03/01/2021   Procedure: EXCISION OF BREAST BIOPSY w/ radiofrequency tag;  Surgeon: Carolan Shiver, MD;  Location: [facility] ORS;  Service: General;  Laterality: Right;   PARTIAL MASTECTOMY WITH NEEDLE LOCALIZATION AND  AXILLARY SENTINEL LYMPH NODE BX Left 03/14/2022   Procedure: PARTIAL MASTECTOMY WITH Radio Frequency tag AND AXILLARY SENTINEL LYMPH NODE BX;  Surgeon: Carolan Shiver, MD;  Location: [facility] ORS;  Service: General;  Laterality: Left;   REPAIR OF ACUTE ASCENDING THORACIC AORTIC DISSECTION N/A 06/08/2003   Procedure: ASCENDING AORTIC DISSECTION REPAIR AND RESUSPENSION OF AORTIC VALVE (28 mm Hemashield like graft); Location: [facility]; Surgeon: Demetrios Loll, MD   REPLACEMENT TOTAL KNEE BILATERAL  TEE WITH CARDIOVERSION  09/26/2018    SOCIAL HISTORY: Social History   Socioeconomic History   Marital status: Legally Separated    Spouse name: Not on file   Number of children: Not on file   Years of education: Not on file   Highest education level: Not on file  Occupational History   Not on file  Tobacco Use   Smoking status: Former    Current packs/day: 0.00    Average packs/day: 0.5 packs/day for 20.0 years (10.0 ttl pk-yrs)    Types: Cigarettes    Start date: 10    Quit date: 2004    Years since quitting: 20.6   Smokeless tobacco: Never  Vaping Use   Vaping status: Never Used  Substance and Sexual Activity   Alcohol use: Yes    Alcohol/week: 0.0 standard drinks of alcohol    Comment: occas   Drug use: No   Sexual activity: Not on file  Other Topics Concern   Not on file  Social History Narrative   Not on file   Social Determinants of Health   Financial Resource Strain: Medium Risk (06/29/2023)   Received from [facility]   Overall Financial Resource Strain (CARDIA)    Difficulty of Paying Living Expenses: Somewhat hard  Food Insecurity: No Food Insecurity (06/29/2023)   [facility] from [facility]   Hunger Vital Sign    Worried About Running Out of Food in the Last Year: Never true    Ran Out of Food in the Last Year: Never true  Transportation Needs: No Transportation Needs (06/29/2023)   Received from [facility] - Transportation    In the past 12 months, has lack of transportation kept you from medical appointments or from getting medications?: No    Lack of Transportation (Non-Medical): No  Physical Activity: Not on file  Stress: Not on file  Social Connections: Not on file  Intimate Partner Violence: Not on file    FAMILY HISTORY: Family History  Problem Relation Age of Onset   Hypertension Mother    Colon cancer Mother    Prostate cancer Father    Kidney cancer Brother    Prostate cancer Brother    Prostate cancer Brother    Breast cancer Neg Hx     ALLERGIES:  is allergic to atorvastatin.  MEDICATIONS:  Current Outpatient Medications  Medication Sig Dispense Refill   amLODipine (NORVASC) 10 MG tablet Take 10 mg by mouth daily.      anastrozole (ARIMIDEX) 1 MG tablet TAKE 1 TABLET(1 MG) BY MOUTH DAILY 30 tablet 3   benazepril (LOTENSIN) 20 MG tablet Take 20 mg by mouth daily.     diphenhydramine-acetaminophen (TYLENOL PM) 25-500 MG TABS tablet Take 1 tablet by mouth at bedtime as needed.     folic acid (FOLVITE) 1 MG tablet TAKE 1 TABLET BY MOUTH DAILY 30 tablet 10   furosemide (LASIX) 20 MG tablet Take 20 mg by mouth.     pantoprazole (PROTONIX) 20 MG tablet Take 1 tablet (20 mg total) by mouth daily. 30 tablet 2   rosuvastatin (CRESTOR) 5 MG tablet Take 5 mg by mouth at bedtime.  2   sodium bicarbonate 650 MG tablet Take 650 mg by mouth 2 (two) times daily.     spironolactone (ALDACTONE) 25 MG tablet Take 25 mg by mouth daily.     warfarin (COUMADIN) 4 MG tablet Take 4 mg by mouth See admin instructions.  amoxicillin (AMOXIL) 500 MG tablet Take 2,000 mg by mouth See admin instructions. Take 2000 mg by mouth one hour prior to dental procedures (Patient not taking: Reported on 09/28/2022)     No current facility-administered medications for this visit.     PHYSICAL EXAMINATION: ECOG PERFORMANCE STATUS: 1 - Symptomatic but completely ambulatory Vitals:   08/08/23  1348  BP: 127/71  Pulse: 69  Resp: 18  Temp: (!) 97.3 F (36.3 C)  SpO2: 99%   Filed Weights   08/08/23 1348  Weight: 151 lb (68.5 kg)    Physical Exam Constitutional:      General: She is not in acute distress.    Appearance: She is not diaphoretic.  HENT:     Head: Normocephalic and atraumatic.     Nose: Nose normal.     Mouth/Throat:     Pharynx: No oropharyngeal exudate.  Eyes:     General: No scleral icterus.       Left eye: No discharge.     Pupils: Pupils are equal, round, and reactive to light.  Neck:     Vascular: No JVD.  Cardiovascular:     Rate and Rhythm: Normal rate. Rhythm irregular.     Heart sounds: Murmur heard.  Pulmonary:     Effort: Pulmonary effort is normal. No respiratory distress.     Breath sounds: No wheezing or rales.  Chest:     Chest wall: No tenderness.  Abdominal:     General: Bowel sounds are normal. There is no distension.     Palpations: Abdomen is soft. There is no mass.     Tenderness: There is no abdominal tenderness. There is no rebound.  Musculoskeletal:        General: No tenderness. Normal range of motion.     Cervical back: Normal range of motion and neck supple.     Right lower leg: Edema present.     Left lower leg: Edema present.  Lymphadenopathy:     Cervical: No cervical adenopathy.  Skin:    General: Skin is warm and dry.     Findings: No erythema or rash.  Neurological:     Mental Status: She is alert and oriented to person, place, and time.     Cranial Nerves: No cranial nerve deficit.     Motor: No abnormal muscle tone.     Coordination: Coordination normal.  Psychiatric:        Mood and Affect: Mood and affect normal.     LABORATORY DATA:  I have reviewed the data as listed    Latest Ref Rng & Units 08/08/2023    1:28 PM 07/11/2023    1:22 PM 06/20/2023   12:58 PM  CBC  WBC 4.0 - 10.5 K/uL 4.1     Hemoglobin 12.0 - 15.0 g/dL 8.7  9.2  9.5   Hematocrit 36.0 - 46.0 % 29.4  30.8  32.5   Platelets 150  - 400 K/uL 163         Latest Ref Rng & Units 04/27/2023    1:37 PM 09/28/2022    1:49 PM 05/09/2022    1:51 PM  CMP  Glucose 70 - 99 mg/dL 90  92  244   BUN 8 - 23 mg/dL 66  32  66   Creatinine 0.44 - 1.00 mg/dL 0.10  2.72  5.36   Sodium 135 - 145 mmol/L 139  142  140   Potassium 3.5 - 5.1 mmol/L 5.9  3.7  4.1   Chloride 98 - 111 mmol/L 110  110  106   CO2 22 - 32 mmol/L 22  25  24    Calcium 8.9 - 10.3 mg/dL 9.7  9.7  9.9   Total Protein 6.5 - 8.1 g/dL 7.1  7.0  7.3   Total Bilirubin 0.3 - 1.2 mg/dL 0.5  0.5  0.5   Alkaline Phos 38 - 126 U/L 106  126  76   AST 15 - 41 U/L 30  16  15    ALT 0 - 44 U/L 31  18  11       Iron/TIBC/Ferritin/ %Sat    Component Value Date/Time   IRON 38 08/08/2023 1328   TIBC 322 08/08/2023 1328   FERRITIN 145 08/08/2023 1328   IRONPCTSAT 12 08/08/2023 1328                                                                                                                                                        RADIOGRAPHIC STUDIES: I have personally reviewed the radiological images as listed and agreed with the findings in the report. No results found.

## 2023-08-08 NOTE — Assessment & Plan Note (Addendum)
Left breast cancer, stage IA, pT1b N0 ER/PR positive, HER2 negative. Oncotype Dx was not sent due to patient being a poor candidate for chemotherapy due to multiple medical problems.She is not interested in chemotherapy as well Labs are reviewed and discussed with patient. Continue Arimidex 1mg  daily  Patient declines DEXA. Recommend calcium and vitamin D supplementation  Obtain annual diagnostic mammogram-February 2025

## 2023-08-08 NOTE — Assessment & Plan Note (Signed)
Patient is on Coumadin for cardiology etiology. History of GI bleeding due to anticoagulation pain Ferritin is <200, will arrange her to get additional Venfoer treatments

## 2023-08-08 NOTE — Assessment & Plan Note (Signed)
Patient is on aromatase inhibitor.   

## 2023-08-09 ENCOUNTER — Telehealth: Payer: Self-pay

## 2023-08-09 NOTE — Telephone Encounter (Signed)
-----   Message from Jeanette Romero sent at 08/08/2023  9:25 PM EDT ----- Please arrange patient to get Venofer weekly x 2

## 2023-08-09 NOTE — Telephone Encounter (Signed)
Called and left message with patient that Dr. Cathie Hoops would like to arrange her to get Venofer weekly x 2 treatments. I will have scheduling schedule these and contact patient of dates and times.

## 2023-08-17 ENCOUNTER — Inpatient Hospital Stay: Payer: Medicare Other | Attending: Oncology

## 2023-08-17 VITALS — BP 149/69 | HR 78 | Temp 97.4°F

## 2023-08-17 DIAGNOSIS — D631 Anemia in chronic kidney disease: Secondary | ICD-10-CM | POA: Diagnosis present

## 2023-08-17 DIAGNOSIS — N184 Chronic kidney disease, stage 4 (severe): Secondary | ICD-10-CM | POA: Insufficient documentation

## 2023-08-17 MED ORDER — SODIUM CHLORIDE 0.9 % IV SOLN
200.0000 mg | Freq: Once | INTRAVENOUS | Status: AC
  Administered 2023-08-17: 200 mg via INTRAVENOUS
  Filled 2023-08-17: qty 200

## 2023-08-17 MED ORDER — SODIUM CHLORIDE 0.9% FLUSH
10.0000 mL | INTRAVENOUS | Status: DC | PRN
  Administered 2023-08-17: 10 mL
  Filled 2023-08-17: qty 10

## 2023-08-17 MED ORDER — SODIUM CHLORIDE 0.9 % IV SOLN
Freq: Once | INTRAVENOUS | Status: AC
  Filled 2023-08-17: qty 250

## 2023-08-17 NOTE — Progress Notes (Signed)
Patient tolerated Venofer infusion well. Explained recommendation of 30 min post monitoring. Patient refused to wait post monitoring. Educated on what signs to watch for & to call with any concerns. No questions, discharged. Stable  

## 2023-08-17 NOTE — Patient Instructions (Signed)
 Iron Sucrose Injection What is this medication? IRON SUCROSE (EYE ern SOO krose) treats low levels of iron (iron deficiency anemia) in people with kidney disease. Iron is a mineral that plays an important role in making red blood cells, which carry oxygen from your lungs to the rest of your body. This medicine may be used for other purposes; ask your health care provider or pharmacist if you have questions. COMMON BRAND NAME(S): Venofer What should I tell my care team before I take this medication? They need to know if you have any of these conditions: Anemia not caused by low iron levels Heart disease High levels of iron in the blood Kidney disease Liver disease An unusual or allergic reaction to iron, other medications, foods, dyes, or preservatives Pregnant or trying to get pregnant Breastfeeding How should I use this medication? This medication is for infusion into a vein. It is given in a hospital or clinic setting. Talk to your care team about the use of this medication in children. While this medication may be prescribed for children as young as 2 years for selected conditions, precautions do apply. Overdosage: If you think you have taken too much of this medicine contact a poison control center or emergency room at once. NOTE: This medicine is only for you. Do not share this medicine with others. What if I miss a dose? Keep appointments for follow-up doses. It is important not to miss your dose. Call your care team if you are unable to keep an appointment. What may interact with this medication? Do not take this medication with any of the following: Deferoxamine Dimercaprol Other iron products This medication may also interact with the following: Chloramphenicol Deferasirox This list may not describe all possible interactions. Give your health care provider a list of all the medicines, herbs, non-prescription drugs, or dietary supplements you use. Also tell them if you smoke,  drink alcohol, or use illegal drugs. Some items may interact with your medicine. What should I watch for while using this medication? Visit your care team regularly. Tell your care team if your symptoms do not start to get better or if they get worse. You may need blood work done while you are taking this medication. You may need to follow a special diet. Talk to your care team. Foods that contain iron include: whole grains/cereals, dried fruits, beans, or peas, leafy green vegetables, and organ meats (liver, kidney). What side effects may I notice from receiving this medication? Side effects that you should report to your care team as soon as possible: Allergic reactions--skin rash, itching, hives, swelling of the face, lips, tongue, or throat Low blood pressure--dizziness, feeling faint or lightheaded, blurry vision Shortness of breath Side effects that usually do not require medical attention (report to your care team if they continue or are bothersome): Flushing Headache Joint pain Muscle pain Nausea Pain, redness, or irritation at injection site This list may not describe all possible side effects. Call your doctor for medical advice about side effects. You may report side effects to FDA at 1-800-FDA-1088. Where should I keep my medication? This medication is given in a hospital or clinic. It will not be stored at home. NOTE: This sheet is a summary. It may not cover all possible information. If you have questions about this medicine, talk to your doctor, pharmacist, or health care provider.  2024 Elsevier/Gold Standard (2023-05-05 00:00:00)

## 2023-08-24 ENCOUNTER — Inpatient Hospital Stay: Payer: Medicare Other

## 2023-08-24 VITALS — BP 129/78 | HR 57 | Temp 97.6°F

## 2023-08-24 DIAGNOSIS — N184 Chronic kidney disease, stage 4 (severe): Secondary | ICD-10-CM | POA: Diagnosis not present

## 2023-08-24 DIAGNOSIS — D631 Anemia in chronic kidney disease: Secondary | ICD-10-CM

## 2023-08-24 MED ORDER — SODIUM CHLORIDE 0.9 % IV SOLN
Freq: Once | INTRAVENOUS | Status: AC
  Filled 2023-08-24: qty 250

## 2023-08-24 MED ORDER — SODIUM CHLORIDE 0.9 % IV SOLN
200.0000 mg | Freq: Once | INTRAVENOUS | Status: AC
  Administered 2023-08-24: 200 mg via INTRAVENOUS
  Filled 2023-08-24: qty 200

## 2023-08-24 NOTE — Patient Instructions (Signed)
Iron Sucrose Injection What is this medication? IRON SUCROSE (EYE ern SOO krose) treats low levels of iron (iron deficiency anemia) in people with kidney disease. Iron is a mineral that plays an important role in making red blood cells, which carry oxygen from your lungs to the rest of your body. This medicine may be used for other purposes; ask your health care provider or pharmacist if you have questions. COMMON BRAND NAME(S): Venofer What should I tell my care team before I take this medication? They need to know if you have any of these conditions: Anemia not caused by low iron levels Heart disease High levels of iron in the blood Kidney disease Liver disease An unusual or allergic reaction to iron, other medications, foods, dyes, or preservatives Pregnant or trying to get pregnant Breastfeeding How should I use this medication? This medication is for infusion into a vein. It is given in a hospital or clinic setting. Talk to your care team about the use of this medication in children. While this medication may be prescribed for children as young as 2 years for selected conditions, precautions do apply. Overdosage: If you think you have taken too much of this medicine contact a poison control center or emergency room at once. NOTE: This medicine is only for you. Do not share this medicine with others. What if I miss a dose? Keep appointments for follow-up doses. It is important not to miss your dose. Call your care team if you are unable to keep an appointment. What may interact with this medication? Do not take this medication with any of the following: Deferoxamine Dimercaprol Other iron products This medication may also interact with the following: Chloramphenicol Deferasirox This list may not describe all possible interactions. Give your health care provider a list of all the medicines, herbs, non-prescription drugs, or dietary supplements you use. Also tell them if you smoke,  drink alcohol, or use illegal drugs. Some items may interact with your medicine. What should I watch for while using this medication? Visit your care team regularly. Tell your care team if your symptoms do not start to get better or if they get worse. You may need blood work done while you are taking this medication. You may need to follow a special diet. Talk to your care team. Foods that contain iron include: whole grains/cereals, dried fruits, beans, or peas, leafy green vegetables, and organ meats (liver, kidney). What side effects may I notice from receiving this medication? Side effects that you should report to your care team as soon as possible: Allergic reactions--skin rash, itching, hives, swelling of the face, lips, tongue, or throat Low blood pressure--dizziness, feeling faint or lightheaded, blurry vision Shortness of breath Side effects that usually do not require medical attention (report to your care team if they continue or are bothersome): Flushing Headache Joint pain Muscle pain Nausea Pain, redness, or irritation at injection site This list may not describe all possible side effects. Call your doctor for medical advice about side effects. You may report side effects to FDA at 1-800-FDA-1088. Where should I keep my medication? This medication is given in a hospital or clinic. It will not be stored at home. NOTE: This sheet is a summary. It may not cover all possible information. If you have questions about this medicine, talk to your doctor, pharmacist, or health care provider.  2024 Elsevier/Gold Standard (2023-05-05 00:00:00)

## 2023-08-29 ENCOUNTER — Inpatient Hospital Stay: Payer: Medicare Other

## 2023-08-29 VITALS — BP 143/86 | HR 63

## 2023-08-29 DIAGNOSIS — N184 Chronic kidney disease, stage 4 (severe): Secondary | ICD-10-CM

## 2023-08-29 LAB — HEMOGLOBIN AND HEMATOCRIT, BLOOD
HCT: 33.1 % — ABNORMAL LOW (ref 36.0–46.0)
Hemoglobin: 9.7 g/dL — ABNORMAL LOW (ref 12.0–15.0)

## 2023-08-29 MED ORDER — EPOETIN ALFA-EPBX 40000 UNIT/ML IJ SOLN
40000.0000 [IU] | Freq: Once | INTRAMUSCULAR | Status: AC
  Administered 2023-08-29: 40000 [IU] via SUBCUTANEOUS
  Filled 2023-08-29: qty 1

## 2023-08-29 MED ORDER — EPOETIN ALFA-EPBX 20000 UNIT/ML IJ SOLN
20000.0000 [IU] | Freq: Once | INTRAMUSCULAR | Status: AC
  Administered 2023-08-29: 20000 [IU] via SUBCUTANEOUS
  Filled 2023-08-29: qty 1

## 2023-09-19 ENCOUNTER — Inpatient Hospital Stay: Payer: Medicare Other

## 2023-09-26 ENCOUNTER — Inpatient Hospital Stay: Payer: Medicare Other

## 2023-09-26 ENCOUNTER — Inpatient Hospital Stay: Payer: Medicare Other | Attending: Oncology

## 2023-09-26 VITALS — BP 138/80 | HR 68 | Temp 97.8°F

## 2023-09-26 DIAGNOSIS — D631 Anemia in chronic kidney disease: Secondary | ICD-10-CM | POA: Diagnosis present

## 2023-09-26 DIAGNOSIS — N184 Chronic kidney disease, stage 4 (severe): Secondary | ICD-10-CM | POA: Diagnosis present

## 2023-09-26 LAB — HEMOGLOBIN AND HEMATOCRIT, BLOOD
HCT: 32.4 % — ABNORMAL LOW (ref 36.0–46.0)
Hemoglobin: 9.5 g/dL — ABNORMAL LOW (ref 12.0–15.0)

## 2023-09-26 MED ORDER — EPOETIN ALFA-EPBX 40000 UNIT/ML IJ SOLN
40000.0000 [IU] | Freq: Once | INTRAMUSCULAR | Status: AC
  Administered 2023-09-26: 40000 [IU] via SUBCUTANEOUS
  Filled 2023-09-26: qty 1

## 2023-09-26 MED ORDER — SODIUM CHLORIDE 0.9 % IV SOLN: 200.0000 mg | Freq: Once | INTRAVENOUS | Status: DC

## 2023-09-26 MED ORDER — EPOETIN ALFA-EPBX 20000 UNIT/ML IJ SOLN
20000.0000 [IU] | Freq: Once | INTRAMUSCULAR | Status: AC
  Administered 2023-09-26: 20000 [IU] via SUBCUTANEOUS
  Filled 2023-09-26: qty 1

## 2023-10-10 ENCOUNTER — Inpatient Hospital Stay: Payer: Medicare Other

## 2023-10-10 VITALS — BP 133/73 | HR 62

## 2023-10-10 DIAGNOSIS — D631 Anemia in chronic kidney disease: Secondary | ICD-10-CM

## 2023-10-10 DIAGNOSIS — N184 Chronic kidney disease, stage 4 (severe): Secondary | ICD-10-CM | POA: Diagnosis not present

## 2023-10-10 LAB — HEMOGLOBIN AND HEMATOCRIT, BLOOD
HCT: 33.5 % — ABNORMAL LOW (ref 36.0–46.0)
Hemoglobin: 9.7 g/dL — ABNORMAL LOW (ref 12.0–15.0)

## 2023-10-10 MED ORDER — EPOETIN ALFA-EPBX 20000 UNIT/ML IJ SOLN: 20000.0000 [IU] | Freq: Once | INTRAMUSCULAR | Status: DC

## 2023-10-10 MED ORDER — EPOETIN ALFA-EPBX 40000 UNIT/ML IJ SOLN: 40000.0000 [IU] | Freq: Once | INTRAMUSCULAR | Status: DC

## 2023-10-10 NOTE — Progress Notes (Signed)
Pt came for lab possible retacrit today. Unfortunately she is scheduled for every 3 weeks per MD orders. She received injection 2 weeks ago. Appts are being changed. Pt understands.

## 2023-10-16 ENCOUNTER — Other Ambulatory Visit: Payer: Self-pay

## 2023-10-16 ENCOUNTER — Other Ambulatory Visit: Payer: Self-pay | Admitting: Oncology

## 2023-10-16 DIAGNOSIS — D631 Anemia in chronic kidney disease: Secondary | ICD-10-CM

## 2023-10-17 ENCOUNTER — Inpatient Hospital Stay: Payer: Medicare Other

## 2023-10-17 ENCOUNTER — Inpatient Hospital Stay: Payer: Medicare Other | Attending: Oncology

## 2023-10-17 ENCOUNTER — Other Ambulatory Visit: Payer: Self-pay

## 2023-10-17 VITALS — BP 145/95 | HR 96

## 2023-10-17 DIAGNOSIS — N184 Chronic kidney disease, stage 4 (severe): Secondary | ICD-10-CM | POA: Diagnosis present

## 2023-10-17 DIAGNOSIS — D631 Anemia in chronic kidney disease: Secondary | ICD-10-CM | POA: Insufficient documentation

## 2023-10-17 LAB — HEMOGLOBIN AND HEMATOCRIT (CANCER CENTER ONLY)
HCT: 32 % — ABNORMAL LOW (ref 36.0–46.0)
Hemoglobin: 9.4 g/dL — ABNORMAL LOW (ref 12.0–15.0)

## 2023-10-17 MED ORDER — EPOETIN ALFA-EPBX 20000 UNIT/ML IJ SOLN
20000.0000 [IU] | Freq: Once | INTRAMUSCULAR | Status: AC
  Administered 2023-10-17: 20000 [IU] via SUBCUTANEOUS
  Filled 2023-10-17: qty 1

## 2023-10-17 MED ORDER — EPOETIN ALFA-EPBX 40000 UNIT/ML IJ SOLN
40000.0000 [IU] | Freq: Once | INTRAMUSCULAR | Status: AC
  Administered 2023-10-17: 40000 [IU] via SUBCUTANEOUS
  Filled 2023-10-17: qty 1

## 2023-10-17 MED ORDER — ANASTROZOLE 1 MG PO TABS: ORAL_TABLET | ORAL | 3 refills | Status: DC

## 2023-10-18 ENCOUNTER — Encounter: Payer: Self-pay | Admitting: Oncology

## 2023-10-31 ENCOUNTER — Other Ambulatory Visit: Payer: Medicare Other

## 2023-11-02 ENCOUNTER — Ambulatory Visit: Payer: Medicare Other | Admitting: Oncology

## 2023-11-02 ENCOUNTER — Ambulatory Visit: Payer: Medicare Other

## 2023-11-07 ENCOUNTER — Encounter: Payer: Self-pay | Admitting: Oncology

## 2023-11-07 ENCOUNTER — Inpatient Hospital Stay: Payer: Medicare Other

## 2023-11-07 ENCOUNTER — Inpatient Hospital Stay: Payer: Medicare Other | Admitting: Oncology

## 2023-11-07 VITALS — BP 143/91 | HR 60 | Temp 97.5°F | Resp 18 | Wt 152.1 lb

## 2023-11-07 DIAGNOSIS — D631 Anemia in chronic kidney disease: Secondary | ICD-10-CM

## 2023-11-07 DIAGNOSIS — Z7901 Long term (current) use of anticoagulants: Secondary | ICD-10-CM | POA: Diagnosis not present

## 2023-11-07 DIAGNOSIS — N184 Chronic kidney disease, stage 4 (severe): Secondary | ICD-10-CM | POA: Diagnosis not present

## 2023-11-07 DIAGNOSIS — C50919 Malignant neoplasm of unspecified site of unspecified female breast: Secondary | ICD-10-CM | POA: Diagnosis not present

## 2023-11-07 DIAGNOSIS — N6099 Unspecified benign mammary dysplasia of unspecified breast: Secondary | ICD-10-CM | POA: Diagnosis not present

## 2023-11-07 LAB — RETIC PANEL
Immature Retic Fract: 11.5 % (ref 2.3–15.9)
RBC.: 3.4 MIL/uL — ABNORMAL LOW (ref 3.87–5.11)
Retic Count, Absolute: 49 10*3/uL (ref 19.0–186.0)
Retic Ct Pct: 1.4 % (ref 0.4–3.1)
Reticulocyte Hemoglobin: 29.7 pg (ref >27.9)

## 2023-11-07 LAB — CBC WITH DIFFERENTIAL (CANCER CENTER ONLY)
Abs Immature Granulocytes: 0.01 10*3/uL (ref 0.00–0.07)
Basophils Absolute: 0 10*3/uL (ref 0.0–0.1)
Basophils Relative: 1 %
Eosinophils Absolute: 0.1 10*3/uL (ref 0.0–0.5)
Eosinophils Relative: 3 %
HCT: 33 % — ABNORMAL LOW (ref 36.0–46.0)
Hemoglobin: 9.8 g/dL — ABNORMAL LOW (ref 12.0–15.0)
Immature Granulocytes: 0 %
Lymphocytes Relative: 10 %
Lymphs Abs: 0.3 10*3/uL — ABNORMAL LOW (ref 0.7–4.0)
MCH: 28.9 pg (ref 26.0–34.0)
MCHC: 29.7 g/dL — ABNORMAL LOW (ref 30.0–36.0)
MCV: 97.3 fL (ref 80.0–100.0)
Monocytes Absolute: 0.3 10*3/uL (ref 0.1–1.0)
Monocytes Relative: 10 %
Neutro Abs: 2.5 10*3/uL (ref 1.7–7.7)
Neutrophils Relative %: 76 %
Platelet Count: 157 10*3/uL (ref 150–400)
RBC: 3.39 MIL/uL — ABNORMAL LOW (ref 3.87–5.11)
RDW: 15.4 % (ref 11.5–15.5)
WBC Count: 3.3 10*3/uL — ABNORMAL LOW (ref 4.0–10.5)
nRBC: 0 % (ref 0.0–0.2)

## 2023-11-07 LAB — SAMPLE TO BLOOD BANK

## 2023-11-07 LAB — IRON AND TIBC
Iron: 46 ug/dL (ref 28–170)
Saturation Ratios: 15 % (ref 10.4–31.8)
TIBC: 309 ug/dL (ref 250–450)
UIBC: 263 ug/dL

## 2023-11-07 LAB — FERRITIN: Ferritin: 217 ng/mL (ref 11–307)

## 2023-11-07 MED ORDER — EPOETIN ALFA-EPBX 20000 UNIT/ML IJ SOLN
20000.0000 [IU] | Freq: Once | INTRAMUSCULAR | Status: AC
Start: 2023-11-07 — End: 2023-11-07
  Administered 2023-11-07: 20000 [IU] via SUBCUTANEOUS
  Filled 2023-11-07: qty 1

## 2023-11-07 MED ORDER — EPOETIN ALFA-EPBX 40000 UNIT/ML IJ SOLN
40000.0000 [IU] | Freq: Once | INTRAMUSCULAR | Status: AC
  Administered 2023-11-07: 40000 [IU] via SUBCUTANEOUS
  Filled 2023-11-07: qty 1

## 2023-11-07 NOTE — Assessment & Plan Note (Signed)
Left breast cancer, stage IA, pT1b N0 ER/PR positive, HER2 negative. Oncotype Dx was not sent due to patient being a poor candidate for chemotherapy due to multiple medical problems.She is not interested in chemotherapy as well Labs are reviewed and discussed with patient. Continue Arimidex 1mg  daily  Patient declines DEXA. Recommend calcium and vitamin D supplementation  Obtain annual diagnostic mammogram-February 2025

## 2023-11-07 NOTE — Progress Notes (Signed)
Hematology/Oncology Progress note [phone] [phone]      Patient Care Team: Marisue Ivan, MD as PCP - General (Family Medicine) Rickard Patience, MD as Consulting Physician (Hematology and Oncology) Scarlett Presto, RN (Inactive) as Oncology Nurse Navigator  ASSESSMENT & PLAN:   Cancer Staging  Invasive carcinoma of breast [facility]) Staging form: Breast, AJCC 8th Edition - Pathologic stage from 03/31/2022: Stage IA (pT1b, pN0, cM0, G1, ER+, PR+, HER2-) - Signed by Rickard Patience, MD on 03/31/2022   Invasive carcinoma of breast (HCC) Left breast cancer, stage IA, pT1b N0 ER/PR positive, HER2 negative. Oncotype Dx was not sent due to patient being a poor candidate for chemotherapy due to multiple medical problems.She is not interested in chemotherapy as well Labs are reviewed and discussed with patient. Continue Arimidex 1mg  daily  Patient declines DEXA. Recommend calcium and vitamin D supplementation  Obtain annual diagnostic mammogram-February 2025  Chronic anticoagulation Patient is on Coumadin for cardiology etiology. History of GI bleeding due to anticoagulation pain   Anemia of chronic renal failure Labs are reviewed and discussed with patient. Lab Results  Component Value Date   HGB 9.8 (L) 11/07/2023   TIBC 309 11/07/2023   IRONPCTSAT 15 11/07/2023   FERRITIN 217 11/07/2023    Ferritin is >200, at goal.  Q3 weeks Retacrit 60,000 if hemoglobin is less than 10.   Atypical lobular hyperplasia [facility]) of breast Patient is on aromatase inhibitor.   Orders Placed This Encounter  Procedures   CBC with Differential (Cancer Center Only)    Standing Status:   Future    Standing Expiration Date:   11/06/2024   Iron and TIBC    Standing Status:   Future    Standing Expiration Date:   11/06/2024   Ferritin    Standing Status:   Future    Standing Expiration Date:   11/06/2024   Hepatic function panel    Standing Status:   Future    Standing Expiration  Date:   11/06/2024   Retic Panel    Standing Status:   Future    Standing Expiration Date:   11/06/2024   Hemoglobin and Hematocrit (Cancer Center Only)    Standing Status:   Standing    Number of Occurrences:   3    Standing Expiration Date:   11/06/2024   Follow up  Lab H&H in 3 weeks, 6 weeks, 9 weeks +/- retacrit  Follow up in 3 months, labs prior to MD +/- Venofer +/- reatcrit  CBC, Iron, TIBC Ferritin, retic panel   All questions were answered. The patient knows to call the clinic with any problems, questions or concerns.  Rickard Patience, MD, PhD [facility] Hematology Oncology 11/07/2023    REASON FOR VISIT Follow up for breast cancer, anemia secondary to chronic kidney disease, chronic anticoagulation  HISTORY OF PRESENTING ILLNESS:  Patient presents for follow-up of breast cancer, anemia secondary to chronic kidney disease, chronic anticoagulation Oncology history summary listed as below. Oncology History  Invasive carcinoma of breast (HCC)  02/08/2022 Mammogram   Unilateral diagnostic mammogram /us 1. Suspicious mass left breast 10:30 o'clock. 2. Anterior to this suspicious mass on the true lateral view is an additional small lobular mass which is not visualized on ultrasound.     02/28/2022 Initial Diagnosis   Invasive carcinoma of breast (HCC)   03/14/2022 Surgery    patient is status post left breast lumpectomy Pathology showed left mammary carcinoma, no special type, 2 sentinel lymph nodes were excised  and both were negative.  Margins are negative for invasive carcinoma. pT1b pN0.  Previous biopsy showed ER positive, PR positive HER2 negative   03/31/2022 Cancer Staging   Staging form: Breast, AJCC 8th Edition - Pathologic stage from 03/31/2022: Stage IA (pT1b, pN0, cM0, G1, ER+, PR+, HER2-) - Signed by Rickard Patience, MD on 03/31/2022 Stage prefix: Initial diagnosis Multigene prognostic tests performed: None Histologic grading system: 3 grade system   04/25/2022 -  05/24/2022 Radiation Therapy   S/p adjuvant breast radiation    Genetic Testing   Patient declined genetic testing   05/26/2022 -  Anti-estrogen oral therapy   Started Arimidex     INTERVAL HISTORY 72 y.o. female  presents for follow-up for evaluation of breast cancer, anemia due to chronic kidney disease, chronic anticoagulation. Patient currently is on Arimidex 1mg  daily overall tolerates well.  Manageable side effects.   Patient reports feeling well.  No new complaints. + fatigue stable    Review of Systems  Constitutional:  Positive for malaise/fatigue. Negative for chills, fever and weight loss.  HENT:  Negative for sore throat.   Eyes:  Negative for redness.  Respiratory:  Negative for cough, shortness of breath and wheezing.   Cardiovascular:  Negative for chest pain, palpitations and leg swelling.  Gastrointestinal:  Negative for abdominal pain, blood in stool, nausea and vomiting.  Genitourinary:  Negative for dysuria.  Musculoskeletal:  Negative for myalgias.  Skin:  Negative for rash.  Neurological:  Negative for dizziness, tingling and tremors.  Endo/Heme/Allergies:  Does not bruise/bleed easily.  Psychiatric/Behavioral:  Negative for hallucinations.     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 Jude mechanical valve 09/19/2003   Arthritis    Breast cancer, left (HCC) 02/22/2022   a.) CNB 02/22/2022 (+) for invasive mammary carcinoma; stage IA (cT1b, cN0, cM0, G1, ER+, PR+, HER2-).   Cerebrovascular accident (CVA) due to embolism of other precerebral  artery (HCC)    CKD (chronic kidney disease) stage 4, GFR 15-29 ml/min (HCC)    Coronary artery disease    a.) LHC 05/07/2015: 50% RPDA, 50% inf septal, 50% mLCx, 50% pLAD, 40% mRCA; intervention deferred opting for medical mgmt.   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 b.) s/p DCCV 2016 and TEE with cardioversion 09/2018. 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   a.) 28 mm Hemashield like graft    SURGICAL HISTORY: Past Surgical History:  Procedure Laterality Date   ABDOMINAL HYSTERECTOMY     AORTIC VALVE REPLACEMENT N/A 09/19/2003   BREAST BIOPSY Right 01/29/2021   [place] Bx, 6:00 vision clip --> sclerosing intraductal papilloma; (-) for atypia and malignancy   BREAST BIOPSY Right 01/29/2021   [place] Bx, 8:00 Ribbon clip --> usual ductal hyperplasia; (-) for atypia and malignancy   BREAST LUMPECTOMY Left 03/15/2022   CARDIAC CATHETERIZATION N/A 05/07/2015   Procedure: Left Heart Cath;  Surgeon: Laurier Nancy, MD;  Location: [facility] INVASIVE CV LAB;  Service: Cardiovascular;  Laterality: N/A;   COLONOSCOPY Left 02/20/2019   Procedure: COLONOSCOPY;  Surgeon: Pasty Spillers, MD;  Location: [facility] ENDOSCOPY;  Service: Endoscopy;  Laterality: Left;   COLONOSCOPY N/A 02/21/2019    Procedure: COLONOSCOPY;  Surgeon: Pasty Spillers, MD;  Location: [facility] ENDOSCOPY;  Service: Endoscopy;  Laterality: N/A;   ELECTROPHYSIOLOGIC STUDY N/A 05/05/2015   Procedure: Cardioversion;  Surgeon: Laurier Nancy, MD;  Location: [facility] ORS;  Service: Cardiovascular;  Laterality: N/A;   ENTEROSCOPY N/A 02/21/2019   Procedure: ENTEROSCOPY;  Surgeon: Pasty Spillers, MD;  Location: [facility] ENDOSCOPY;  Service: Endoscopy;  Laterality: N/A;   ESOPHAGOGASTRODUODENOSCOPY Left 02/19/2019   Procedure: ESOPHAGOGASTRODUODENOSCOPY (EGD);  Surgeon: Pasty Spillers, MD;  Location: [facility] ENDOSCOPY;  Service: Endoscopy;  Laterality: Left;   EXCISION OF BREAST BIOPSY Right 03/01/2021   Procedure: EXCISION OF BREAST BIOPSY w/ radiofrequency tag;  Surgeon: Carolan Shiver, MD;  Location: [facility] ORS;  Service: General;  Laterality: Right;   PARTIAL MASTECTOMY WITH NEEDLE LOCALIZATION AND AXILLARY SENTINEL LYMPH NODE BX Left 03/14/2022   Procedure: PARTIAL MASTECTOMY WITH Radio Frequency tag AND AXILLARY SENTINEL LYMPH NODE BX;  Surgeon: Carolan Shiver, MD;  Location: [facility] ORS;  Service: General;  Laterality: Left;   REPAIR OF ACUTE ASCENDING THORACIC AORTIC DISSECTION N/A 06/08/2003   Procedure: ASCENDING AORTIC DISSECTION REPAIR AND RESUSPENSION OF AORTIC VALVE (28 mm Hemashield like graft); Location: [facility]; Surgeon: Demetrios Loll, MD   REPLACEMENT TOTAL KNEE BILATERAL     TEE WITH CARDIOVERSION  09/26/2018    SOCIAL HISTORY: Social History   Socioeconomic History   Marital status: Legally Separated    Spouse name: Not on file   Number of children: Not on file   Years of education: Not on file   Highest education level: Not on file  Occupational History   Not on file  Tobacco Use   Smoking status: Former    Current packs/day: 0.00    Average packs/day: 0.5 packs/day for 20.0 years (10.0 ttl pk-yrs)    Types: Cigarettes    Start date: 39    Quit date: 2004    Years since  quitting: 20.9   Smokeless tobacco: Never  Vaping Use   Vaping status: Never Used  Substance and Sexual Activity   Alcohol use: Yes    Alcohol/week: 0.0 standard drinks of alcohol    Comment: occas   Drug use: No   Sexual activity: Not on file  Other Topics Concern   Not on file  Social History Narrative   Not on file   Social Determinants of Health   Financial Resource Strain: Medium Risk (06/29/2023)   Received [facility]   Overall Financial Resource Strain (CARDIA)    Difficulty of Paying Living Expenses: Somewhat  hard  Food Insecurity: No Food Insecurity (06/29/2023)   [facility]   Hunger Vital Sign    Worried About Running Out of Food in the Last Year: Never true    Ran Out of Food in the Last Year: Never true  Transportation Needs: No Transportation Needs (06/29/2023)   Received from [facility] - Transportation    In the past 12 months, has lack of transportation kept you from medical appointments or from getting medications?: No    Lack of Transportation (Non-Medical): No  Physical Activity: Not on file  Stress: Not on file  Social Connections: Not on file  Intimate Partner Violence: Not on file    FAMILY HISTORY: Family History  Problem Relation Age of Onset   Hypertension Mother    Colon cancer Mother    Prostate cancer Father    Kidney cancer Brother    Prostate cancer Brother    Prostate cancer Brother    Breast cancer Neg Hx     ALLERGIES:  is allergic to atorvastatin.  MEDICATIONS:  Current Outpatient Medications  Medication Sig Dispense Refill   amLODipine (NORVASC) 10 MG tablet Take 10 mg by mouth daily.      anastrozole (ARIMIDEX) 1 MG tablet TAKE 1 TABLET(1 MG) BY MOUTH DAILY 90 tablet 0   anastrozole (ARIMIDEX) 1 MG tablet TAKE 1 TABLET(1 MG) BY MOUTH DAILY 30 tablet 3   benazepril (LOTENSIN) 20 MG tablet Take 20 mg by mouth daily.      diphenhydramine-acetaminophen (TYLENOL PM) 25-500 MG TABS tablet Take 1 tablet by mouth at bedtime as needed.     folic acid (FOLVITE) 1 MG tablet TAKE 1 TABLET BY MOUTH DAILY 30 tablet 10   furosemide (LASIX) 20 MG tablet Take 20 mg by mouth.     pantoprazole (PROTONIX) 20 MG tablet Take 1 tablet (20 mg total) by mouth daily. 30 tablet 2   rosuvastatin (CRESTOR) 5 MG tablet Take 5 mg by mouth at bedtime.  2   sodium bicarbonate 650 MG tablet Take 650 mg by mouth 2 (two) times daily.     spironolactone (ALDACTONE) 25 MG tablet Take 25 mg by mouth daily.     warfarin (COUMADIN) 4 MG tablet Take 4 mg by mouth See admin instructions.     amoxicillin (AMOXIL) 500 MG tablet Take 2,000 mg by mouth See admin instructions. Take 2000 mg by mouth one hour prior to dental procedures (Patient not taking: Reported on 09/28/2022)     No current facility-administered medications for this visit.     PHYSICAL EXAMINATION: ECOG PERFORMANCE STATUS: 1 - Symptomatic but completely ambulatory Vitals:   11/07/23 1115 11/07/23 1128  BP: (!) 154/97 (!) 143/91  Pulse: 60   Resp: 18   Temp: (!) 97.5 F (36.4 C)   SpO2: 97%    Filed Weights   11/07/23 1115  Weight: 152 lb 1.6 oz (69 kg)    Physical Exam Constitutional:      General: She is not in acute distress.    Appearance: She is not diaphoretic.  HENT:     Head: Normocephalic and atraumatic.     Nose: Nose normal.     Mouth/Throat:     Pharynx: No oropharyngeal exudate.  Eyes:     General: No scleral icterus.       Left eye: No discharge.     Pupils: Pupils are equal, round, and reactive to light.  Neck:  Vascular: No JVD.  Cardiovascular:     Rate and Rhythm: Normal rate. Rhythm irregular.     Heart sounds: Murmur heard.  Pulmonary:     Effort: Pulmonary effort is normal. No respiratory distress.     Breath sounds: No wheezing or rales.  Chest:     Chest wall: No tenderness.  Abdominal:     General: Bowel sounds are normal. There  is no distension.     Palpations: Abdomen is soft. There is no mass.     Tenderness: There is no abdominal tenderness. There is no rebound.  Musculoskeletal:        General: No tenderness. Normal range of motion.     Cervical back: Normal range of motion and neck supple.     Right lower leg: Edema present.     Left lower leg: Edema present.  Lymphadenopathy:     Cervical: No cervical adenopathy.  Skin:    General: Skin is warm and dry.     Findings: No erythema or rash.  Neurological:     Mental Status: She is alert and oriented to person, place, and time.     Cranial Nerves: No cranial nerve deficit.     Motor: No abnormal muscle tone.     Coordination: Coordination normal.  Psychiatric:        Mood and Affect: Mood and affect normal.     LABORATORY DATA:  I have reviewed the data as listed    Latest Ref Rng & Units 11/07/2023   10:59 AM 10/17/2023    9:07 AM 10/10/2023    9:05 AM  CBC  WBC 4.0 - 10.5 K/uL 3.3     Hemoglobin 12.0 - 15.0 g/dL 9.8  9.4  9.7   Hematocrit 36.0 - 46.0 % 33.0  32.0  33.5   Platelets 150 - 400 K/uL 157         Latest Ref Rng & Units 04/27/2023    1:37 PM 09/28/2022    1:49 PM 05/09/2022    1:51 PM  CMP  Glucose 70 - 99 mg/dL 90  92  829   BUN 8 - 23 mg/dL 66  32  66   Creatinine 0.44 - 1.00 mg/dL 5.62  1.30  8.65   Sodium 135 - 145 mmol/L 139  142  140   Potassium 3.5 - 5.1 mmol/L 5.9  3.7  4.1   Chloride 98 - 111 mmol/L 110  110  106   CO2 22 - 32 mmol/L 22  25  24    Calcium 8.9 - 10.3 mg/dL 9.7  9.7  9.9   Total Protein 6.5 - 8.1 g/dL 7.1  7.0  7.3   Total Bilirubin 0.3 - 1.2 mg/dL 0.5  0.5  0.5   Alkaline Phos 38 - 126 U/L 106  126  76   AST 15 - 41 U/L 30  16  15    ALT 0 - 44 U/L 31  18  11       Iron/TIBC/Ferritin/ %Sat    Component Value Date/Time   IRON 46 11/07/2023 1059   TIBC 309 11/07/2023 1059   FERRITIN 217 11/07/2023 1059   IRONPCTSAT 15 11/07/2023 1059  RADIOGRAPHIC STUDIES: I have personally reviewed the radiological images as listed and agreed with the findings in the report. No results found.

## 2023-11-07 NOTE — Assessment & Plan Note (Signed)
Patient is on aromatase inhibitor.

## 2023-11-07 NOTE — Assessment & Plan Note (Signed)
Patient is on Coumadin for cardiology etiology. History of GI bleeding due to anticoagulation pain

## 2023-11-07 NOTE — Assessment & Plan Note (Signed)
Labs are reviewed and discussed with patient. Lab Results  Component Value Date   HGB 9.8 (L) 11/07/2023   TIBC 309 11/07/2023   IRONPCTSAT 15 11/07/2023   FERRITIN 217 11/07/2023    Ferritin is >200, at goal.  Q3 weeks Retacrit 60,000 if hemoglobin is less than 10.

## 2023-11-28 ENCOUNTER — Inpatient Hospital Stay: Payer: Medicare Other

## 2023-11-28 ENCOUNTER — Inpatient Hospital Stay: Payer: Medicare Other | Attending: Oncology

## 2023-11-28 VITALS — BP 140/67

## 2023-11-28 DIAGNOSIS — D631 Anemia in chronic kidney disease: Secondary | ICD-10-CM | POA: Insufficient documentation

## 2023-11-28 DIAGNOSIS — N184 Chronic kidney disease, stage 4 (severe): Secondary | ICD-10-CM | POA: Diagnosis present

## 2023-11-28 LAB — SAMPLE TO BLOOD BANK

## 2023-11-28 LAB — HEMOGLOBIN AND HEMATOCRIT (CANCER CENTER ONLY)
HCT: 30.9 % — ABNORMAL LOW (ref 36.0–46.0)
Hemoglobin: 9.2 g/dL — ABNORMAL LOW (ref 12.0–15.0)

## 2023-11-28 MED ORDER — EPOETIN ALFA-EPBX 20000 UNIT/ML IJ SOLN
20000.0000 [IU] | Freq: Once | INTRAMUSCULAR | Status: AC
Start: 2023-11-28 — End: 2023-11-28
  Administered 2023-11-28: 20000 [IU] via SUBCUTANEOUS
  Filled 2023-11-28: qty 1

## 2023-11-28 MED ORDER — EPOETIN ALFA-EPBX 40000 UNIT/ML IJ SOLN
40000.0000 [IU] | Freq: Once | INTRAMUSCULAR | Status: AC
Start: 2023-11-28 — End: 2023-11-28
  Administered 2023-11-28: 40000 [IU] via SUBCUTANEOUS
  Filled 2023-11-28: qty 1

## 2023-12-19 ENCOUNTER — Other Ambulatory Visit: Payer: Medicare Other

## 2023-12-19 ENCOUNTER — Ambulatory Visit: Payer: Medicare Other

## 2023-12-21 ENCOUNTER — Inpatient Hospital Stay: Payer: Medicare Other

## 2023-12-21 ENCOUNTER — Inpatient Hospital Stay: Payer: Medicare Other | Attending: Oncology

## 2023-12-21 VITALS — BP 154/96

## 2023-12-21 DIAGNOSIS — D631 Anemia in chronic kidney disease: Secondary | ICD-10-CM | POA: Insufficient documentation

## 2023-12-21 DIAGNOSIS — N184 Chronic kidney disease, stage 4 (severe): Secondary | ICD-10-CM | POA: Insufficient documentation

## 2023-12-21 LAB — SAMPLE TO BLOOD BANK

## 2023-12-21 LAB — HEMOGLOBIN AND HEMATOCRIT (CANCER CENTER ONLY)
HCT: 31.9 % — ABNORMAL LOW (ref 36.0–46.0)
Hemoglobin: 9.4 g/dL — ABNORMAL LOW (ref 12.0–15.0)

## 2023-12-21 MED ORDER — EPOETIN ALFA-EPBX 20000 UNIT/ML IJ SOLN
20000.0000 [IU] | Freq: Once | INTRAMUSCULAR | Status: AC
  Administered 2023-12-21: 20000 [IU] via SUBCUTANEOUS
  Filled 2023-12-21: qty 1

## 2023-12-21 MED ORDER — EPOETIN ALFA-EPBX 40000 UNIT/ML IJ SOLN
40000.0000 [IU] | Freq: Once | INTRAMUSCULAR | Status: AC
  Administered 2023-12-21: 40000 [IU] via SUBCUTANEOUS
  Filled 2023-12-21: qty 1

## 2024-01-09 ENCOUNTER — Inpatient Hospital Stay: Payer: Medicare Other

## 2024-01-09 VITALS — BP 141/63

## 2024-01-09 DIAGNOSIS — D631 Anemia in chronic kidney disease: Secondary | ICD-10-CM

## 2024-01-09 DIAGNOSIS — N184 Chronic kidney disease, stage 4 (severe): Secondary | ICD-10-CM | POA: Diagnosis not present

## 2024-01-09 LAB — SAMPLE TO BLOOD BANK

## 2024-01-09 LAB — HEMOGLOBIN AND HEMATOCRIT (CANCER CENTER ONLY)
HCT: 32.6 % — ABNORMAL LOW (ref 36.0–46.0)
Hemoglobin: 9.7 g/dL — ABNORMAL LOW (ref 12.0–15.0)

## 2024-01-09 MED ORDER — EPOETIN ALFA-EPBX 40000 UNIT/ML IJ SOLN
40000.0000 [IU] | Freq: Once | INTRAMUSCULAR | Status: AC
Start: 2024-01-09 — End: 2024-01-09
  Administered 2024-01-09: 40000 [IU] via SUBCUTANEOUS
  Filled 2024-01-09: qty 1

## 2024-01-09 MED ORDER — EPOETIN ALFA-EPBX 20000 UNIT/ML IJ SOLN
20000.0000 [IU] | Freq: Once | INTRAMUSCULAR | Status: AC
Start: 2024-01-09 — End: 2024-01-09
  Administered 2024-01-09: 20000 [IU] via SUBCUTANEOUS
  Filled 2024-01-09: qty 1

## 2024-01-10 ENCOUNTER — Other Ambulatory Visit: Payer: Self-pay | Admitting: Physician Assistant

## 2024-01-10 DIAGNOSIS — T84039A Mechanical loosening of unspecified internal prosthetic joint, initial encounter: Secondary | ICD-10-CM

## 2024-01-16 ENCOUNTER — Ambulatory Visit
Admission: RE | Admit: 2024-01-16 | Discharge: 2024-01-16 | Disposition: A | Discharge status: Home / Self Care | Payer: Medicare Other | Source: Ambulatory Visit | Attending: Physician Assistant | Admitting: Physician Assistant

## 2024-01-16 DIAGNOSIS — T84039A Mechanical loosening of unspecified internal prosthetic joint, initial encounter: Secondary | ICD-10-CM

## 2024-01-23 ENCOUNTER — Ambulatory Visit
Admission: RE | Admit: 2024-01-23 | Discharge: 2024-01-23 | Disposition: A | Discharge status: Home / Self Care | Payer: Medicare Other | Source: Ambulatory Visit | Attending: Oncology | Admitting: Oncology

## 2024-01-23 ENCOUNTER — Other Ambulatory Visit: Payer: Medicare Other

## 2024-01-23 ENCOUNTER — Ambulatory Visit
Admission: RE | Admit: 2024-01-23 | Discharge: 2024-01-23 | Disposition: A | Discharge status: Home / Self Care | Payer: Medicare Other | Source: Ambulatory Visit | Attending: Oncology

## 2024-01-23 DIAGNOSIS — C50212 Malignant neoplasm of upper-inner quadrant of left female breast: Secondary | ICD-10-CM | POA: Insufficient documentation

## 2024-01-23 DIAGNOSIS — M96843 Postprocedural seroma of a musculoskeletal structure following other procedure: Secondary | ICD-10-CM | POA: Insufficient documentation

## 2024-01-23 DIAGNOSIS — C50919 Malignant neoplasm of unspecified site of unspecified female breast: Secondary | ICD-10-CM

## 2024-01-23 DIAGNOSIS — Z923 Personal history of irradiation: Secondary | ICD-10-CM | POA: Insufficient documentation

## 2024-02-06 ENCOUNTER — Inpatient Hospital Stay: Payer: Medicare Other

## 2024-02-06 ENCOUNTER — Encounter: Payer: Self-pay | Admitting: Oncology

## 2024-02-06 ENCOUNTER — Inpatient Hospital Stay: Payer: Medicare Other | Admitting: Oncology

## 2024-02-06 ENCOUNTER — Inpatient Hospital Stay: Payer: Medicare Other | Attending: Oncology

## 2024-02-06 VITALS — BP 148/69 | HR 64 | Temp 97.4°F | Resp 18 | Wt 159.9 lb

## 2024-02-06 DIAGNOSIS — C50919 Malignant neoplasm of unspecified site of unspecified female breast: Secondary | ICD-10-CM | POA: Diagnosis not present

## 2024-02-06 DIAGNOSIS — Z1721 Progesterone receptor positive status: Secondary | ICD-10-CM | POA: Insufficient documentation

## 2024-02-06 DIAGNOSIS — N184 Chronic kidney disease, stage 4 (severe): Secondary | ICD-10-CM | POA: Diagnosis not present

## 2024-02-06 DIAGNOSIS — E611 Iron deficiency: Secondary | ICD-10-CM | POA: Insufficient documentation

## 2024-02-06 DIAGNOSIS — Z1732 Human epidermal growth factor receptor 2 negative status: Secondary | ICD-10-CM | POA: Insufficient documentation

## 2024-02-06 DIAGNOSIS — D631 Anemia in chronic kidney disease: Secondary | ICD-10-CM | POA: Diagnosis present

## 2024-02-06 DIAGNOSIS — Z170 Estrogen receptor positive status [ER+]: Secondary | ICD-10-CM | POA: Insufficient documentation

## 2024-02-06 DIAGNOSIS — Z923 Personal history of irradiation: Secondary | ICD-10-CM | POA: Insufficient documentation

## 2024-02-06 DIAGNOSIS — Z9012 Acquired absence of left breast and nipple: Secondary | ICD-10-CM | POA: Insufficient documentation

## 2024-02-06 DIAGNOSIS — N6099 Unspecified benign mammary dysplasia of unspecified breast: Secondary | ICD-10-CM | POA: Diagnosis not present

## 2024-02-06 DIAGNOSIS — Z79811 Long term (current) use of aromatase inhibitors: Secondary | ICD-10-CM | POA: Diagnosis not present

## 2024-02-06 DIAGNOSIS — C50212 Malignant neoplasm of upper-inner quadrant of left female breast: Secondary | ICD-10-CM | POA: Diagnosis not present

## 2024-02-06 LAB — HEPATIC FUNCTION PANEL
ALT: 19 U/L (ref 0–44)
AST: 17 U/L (ref 15–41)
Albumin: 3.8 g/dL (ref 3.5–5.0)
Alkaline Phosphatase: 125 U/L (ref 38–126)
Bilirubin, Direct: 0.2 mg/dL (ref 0.0–0.2)
Indirect Bilirubin: 0.3 mg/dL (ref 0.3–0.9)
Total Bilirubin: 0.5 mg/dL (ref 0.0–1.2)
Total Protein: 6.6 g/dL (ref 6.5–8.1)

## 2024-02-06 LAB — RETIC PANEL
Immature Retic Fract: 14.3 % (ref 2.3–15.9)
RBC.: 3.16 MIL/uL — ABNORMAL LOW (ref 3.87–5.11)
Retic Count, Absolute: 44.6 10*3/uL (ref 19.0–186.0)
Retic Ct Pct: 1.4 % (ref 0.4–3.1)
Reticulocyte Hemoglobin: 30.6 pg (ref >27.9)

## 2024-02-06 LAB — CBC WITH DIFFERENTIAL (CANCER CENTER ONLY)
Abs Immature Granulocytes: 0.01 10*3/uL (ref 0.00–0.07)
Basophils Absolute: 0 10*3/uL (ref 0.0–0.1)
Basophils Relative: 1 %
Eosinophils Absolute: 0.1 10*3/uL (ref 0.0–0.5)
Eosinophils Relative: 1 %
HCT: 31.3 % — ABNORMAL LOW (ref 36.0–46.0)
Hemoglobin: 9 g/dL — ABNORMAL LOW (ref 12.0–15.0)
Immature Granulocytes: 0 %
Lymphocytes Relative: 9 %
Lymphs Abs: 0.4 10*3/uL — ABNORMAL LOW (ref 0.7–4.0)
MCH: 28.3 pg (ref 26.0–34.0)
MCHC: 28.8 g/dL — ABNORMAL LOW (ref 30.0–36.0)
MCV: 98.4 fL (ref 80.0–100.0)
Monocytes Absolute: 0.4 10*3/uL (ref 0.1–1.0)
Monocytes Relative: 9 %
Neutro Abs: 3.4 10*3/uL (ref 1.7–7.7)
Neutrophils Relative %: 80 %
Platelet Count: 171 10*3/uL (ref 150–400)
RBC: 3.18 MIL/uL — ABNORMAL LOW (ref 3.87–5.11)
RDW: 15.3 % (ref 11.5–15.5)
WBC Count: 4.2 10*3/uL (ref 4.0–10.5)
nRBC: 0 % (ref 0.0–0.2)

## 2024-02-06 LAB — SAMPLE TO BLOOD BANK

## 2024-02-06 LAB — FERRITIN: Ferritin: 132 ng/mL (ref 11–307)

## 2024-02-06 LAB — IRON AND TIBC
Iron: 43 ug/dL (ref 28–170)
Saturation Ratios: 13 % (ref 10.4–31.8)
TIBC: 326 ug/dL (ref 250–450)
UIBC: 283 ug/dL

## 2024-02-06 MED ORDER — EPOETIN ALFA-EPBX 20000 UNIT/ML IJ SOLN
20000.0000 [IU] | Freq: Once | INTRAMUSCULAR | Status: AC
  Administered 2024-02-06: 20000 [IU] via SUBCUTANEOUS
  Filled 2024-02-06: qty 1

## 2024-02-06 MED ORDER — EPOETIN ALFA-EPBX 40000 UNIT/ML IJ SOLN
40000.0000 [IU] | Freq: Once | INTRAMUSCULAR | Status: AC
  Administered 2024-02-06: 40000 [IU] via SUBCUTANEOUS
  Filled 2024-02-06: qty 1

## 2024-02-06 MED ORDER — ANASTROZOLE 1 MG PO TABS: ORAL_TABLET | ORAL | 1 refills | Status: DC

## 2024-02-06 NOTE — Assessment & Plan Note (Addendum)
 Labs are reviewed and discussed with patient. Lab Results  Component Value Date   HGB 9.0 (L) 02/06/2024   TIBC 326 02/06/2024   IRONPCTSAT 13 02/06/2024   FERRITIN 132 02/06/2024    Ferritin is <200, recommend IV venofer weekly x 2 Q3 weeks Retacrit 60,000 if hemoglobin is less than 10.

## 2024-02-06 NOTE — Progress Notes (Signed)
 Hematology/Oncology Progress note [phone] [phone]      Patient Care Team: Marisue Ivan, MD as PCP - General (Family Medicine) Rickard Patience, MD as Consulting Physician (Hematology and Oncology) Scarlett Presto, RN (Inactive) as Oncology Nurse Navigator  ASSESSMENT & PLAN:   Cancer Staging  Invasive carcinoma of breast [facility]) Staging form: Breast, AJCC 8th Edition - Pathologic stage from 03/31/2022: Stage IA (pT1b, pN0, cM0, G1, ER+, PR+, HER2-) - Signed by Rickard Patience, MD on 03/31/2022   Invasive carcinoma of breast (HCC) Left breast cancer, stage IA, pT1b N0 ER/PR positive, HER2 negative. Oncotype Dx was not sent due to patient being a poor candidate for chemotherapy due to multiple medical problems.She is not interested in chemotherapy as well Labs are reviewed and discussed with patient. Continue Arimidex 1mg  daily  Patient declines DEXA. Recommend calcium and vitamin D supplementation  Obtain annual diagnostic mammogram-February 2025 results were reviewed with patient.   Anemia of chronic renal failure Labs are reviewed and discussed with patient. Lab Results  Component Value Date   HGB 9.0 (L) 02/06/2024   TIBC 326 02/06/2024   IRONPCTSAT 13 02/06/2024   FERRITIN 132 02/06/2024    Ferritin is <200, recommend IV venofer weekly x 2 Q3 weeks Retacrit 60,000 if hemoglobin is less than 10.   Atypical lobular hyperplasia [facility]) of breast Patient is on aromatase inhibitor.   Orders Placed This Encounter  Procedures   CBC with Differential (Cancer Center Only)    Standing Status:   Future    Expected Date:   04/30/2024    Expiration Date:   02/05/2025   Iron and TIBC    Standing Status:   Future    Expected Date:   04/30/2024    Expiration Date:   02/05/2025   Ferritin    Standing Status:   Future    Expected Date:   04/30/2024    Expiration Date:   02/05/2025   Retic Panel    Standing Status:   Future    Expected Date:   04/30/2024     Expiration Date:   02/05/2025   Hemoglobin and Hematocrit (Cancer Center Only)    Standing Status:   Standing    Number of Occurrences:   3    Expiration Date:   02/05/2025   Follow up  Lab H&H in 3 weeks, 6 weeks, 9 weeks +/- retacrit  Follow up in 3 months, labs prior to MD +/- Venofer +/- reatcrit  CBC, Iron, TIBC Ferritin, retic panel   All questions were answered. The patient knows to call the clinic with any problems, questions or concerns.  Rickard Patience, MD, PhD [facility] Hematology Oncology 02/06/2024    REASON FOR VISIT Follow up for breast cancer, anemia secondary to chronic kidney disease, chronic anticoagulation  HISTORY OF PRESENTING ILLNESS:  Patient presents for follow-up of breast cancer, anemia secondary to chronic kidney disease, chronic anticoagulation Oncology history summary listed as below. Oncology History  Invasive carcinoma of breast (HCC)  02/08/2022 Mammogram   Unilateral diagnostic mammogram /us 1. Suspicious mass left breast 10:30 o'clock. 2. Anterior to this suspicious mass on the true lateral view is an additional small lobular mass which is not visualized on ultrasound.     02/28/2022 Initial Diagnosis   Invasive carcinoma of breast (HCC)   03/14/2022 Surgery    patient is status post left breast lumpectomy Pathology showed left mammary carcinoma, no special type, 2 sentinel lymph nodes were excised and both were negative.  Margins are negative for invasive carcinoma. pT1b pN0.  Previous biopsy showed ER positive, PR positive HER2 negative   03/31/2022 Cancer Staging   Staging form: Breast, AJCC 8th Edition - Pathologic stage from 03/31/2022: Stage IA (pT1b, pN0, cM0, G1, ER+, PR+, HER2-) - Signed by Rickard Patience, MD on 03/31/2022 Stage prefix: Initial diagnosis Multigene prognostic tests performed: None Histologic grading system: 3 grade system   04/25/2022 - 05/24/2022 Radiation Therapy   S/p adjuvant breast radiation    Genetic Testing   Patient  declined genetic testing   05/26/2022 -  Anti-estrogen oral therapy   Started Arimidex     INTERVAL HISTORY 73 y.o. female  presents for follow-up for evaluation of breast cancer, anemia due to chronic kidney disease, chronic anticoagulation. Patient currently is on Arimidex 1mg  daily overall tolerates well.  Manageable side effects.   Patient reports feeling well.  No new complaints. + fatigue stable Recently lasix dosage was adjusted due to pulmonary edema. She also has appt with nephrologist next week.    Review of Systems  Constitutional:  Positive for malaise/fatigue. Negative for chills, fever and weight loss.  HENT:  Negative for sore throat.   Eyes:  Negative for redness.  Respiratory:  Negative for cough, shortness of breath and wheezing.   Cardiovascular:  Negative for chest pain, palpitations and leg swelling.  Gastrointestinal:  Negative for abdominal pain, blood in stool, nausea and vomiting.  Genitourinary:  Negative for dysuria.  Musculoskeletal:  Negative for myalgias.  Skin:  Negative for rash.  Neurological:  Negative for dizziness, tingling and tremors.  Endo/Heme/Allergies:  Does not bruise/bleed easily.  Psychiatric/Behavioral:  Negative for hallucinations.     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 Jude mechanical valve 09/19/2003   Arthritis    Breast cancer, left (HCC) 02/22/2022   a.) CNB 02/22/2022 (+) for invasive mammary carcinoma; stage IA (cT1b, cN0, cM0, G1, ER+, PR+, HER2-).   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 b.) s/p DCCV 2016 and TEE with cardioversion 09/2018. 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   a.) 28 mm Hemashield like graft    SURGICAL HISTORY: Past Surgical History:  Procedure Laterality Date   ABDOMINAL HYSTERECTOMY     AORTIC VALVE REPLACEMENT N/A 09/19/2003   BREAST BIOPSY Right 01/29/2021   [place] Bx, 6:00 vision clip --> sclerosing intraductal papilloma; (-) for atypia and malignancy   BREAST BIOPSY Right 01/29/2021   [place] Bx, 8:00 Ribbon clip --> usual ductal hyperplasia; (-) for atypia and malignancy   BREAST LUMPECTOMY Left 03/15/2022   CARDIAC CATHETERIZATION N/A 05/07/2015   Procedure: Left Heart Cath;  Surgeon: Laurier Nancy, MD;  Location: [facility] INVASIVE CV LAB;  Service: Cardiovascular;  Laterality: N/A;   COLONOSCOPY Left 02/20/2019   Procedure: COLONOSCOPY;  Surgeon: Pasty Spillers, MD;  Location: [facility] ENDOSCOPY;  Service: Endoscopy;  Laterality: Left;   COLONOSCOPY N/A  02/21/2019   Procedure: COLONOSCOPY;  Surgeon: Pasty Spillers, MD;  Location: [facility] ENDOSCOPY;  Service: Endoscopy;  Laterality: N/A;   ELECTROPHYSIOLOGIC STUDY N/A 05/05/2015   Procedure: Cardioversion;  Surgeon: Laurier Nancy, MD;  Location: [facility] ORS;  Service: Cardiovascular;  Laterality: N/A;   ENTEROSCOPY N/A 02/21/2019   Procedure: ENTEROSCOPY;  Surgeon: Pasty Spillers, MD;  Location: [facility] ENDOSCOPY;  Service: Endoscopy;  Laterality: N/A;   ESOPHAGOGASTRODUODENOSCOPY Left 02/19/2019   Procedure: ESOPHAGOGASTRODUODENOSCOPY (EGD);  Surgeon: Pasty Spillers, MD;  Location: [facility] ENDOSCOPY;  Service: Endoscopy;  Laterality: Left;   EXCISION OF BREAST BIOPSY Right 03/01/2021   Procedure: EXCISION OF BREAST BIOPSY w/ radiofrequency tag;  Surgeon: Carolan Shiver, MD;  Location: [facility] ORS;  Service: General;  Laterality: Right;   PARTIAL MASTECTOMY WITH NEEDLE LOCALIZATION AND AXILLARY SENTINEL LYMPH NODE BX Left 03/14/2022   Procedure: PARTIAL MASTECTOMY WITH Radio Frequency tag AND AXILLARY SENTINEL LYMPH NODE BX;  Surgeon: Carolan Shiver, MD;  Location: [facility] ORS;  Service: General;  Laterality: Left;   REPAIR OF ACUTE ASCENDING THORACIC AORTIC DISSECTION N/A 06/08/2003   Procedure: ASCENDING AORTIC DISSECTION REPAIR AND RESUSPENSION OF AORTIC VALVE (28 mm Hemashield like graft); Location: [facility]; Surgeon: Demetrios Loll, MD   REPLACEMENT TOTAL KNEE BILATERAL     TEE WITH CARDIOVERSION  09/26/2018    SOCIAL HISTORY: Social History   Socioeconomic History   Marital status: Legally Separated    Spouse name: Not on file   Number of children: Not on file   Years of education: Not on file   Highest education level: Not on file  Occupational History   Not on file  Tobacco Use   Smoking status: Former    Current packs/day: 0.00    Average packs/day: 0.5 packs/day for 20.0 years (10.0 ttl pk-yrs)    Types: Cigarettes    Start date: 72    Quit date: 2004     Years since quitting: 21.1   Smokeless tobacco: Never  Vaping Use   Vaping status: Never Used  Substance and Sexual Activity   Alcohol use: Yes    Alcohol/week: 0.0 standard drinks of alcohol    Comment: occas   Drug use: No   Sexual activity: Not on file  Other Topics Concern   Not on file  Social History Narrative   Not on file   Social Drivers of Health   Financial Resource Strain: Low Risk  (01/02/2024)   Received from [facility]  Overall Financial Resource Strain (CARDIA)    Difficulty of Paying Living Expenses: Not hard at all  Food Insecurity: No Food Insecurity (01/02/2024)   Received from [facility]   Hunger Vital Sign    Worried About Running Out of Food in the Last Year: Never true    Ran Out of Food in the Last Year: Never true  Transportation Needs: No Transportation Needs (01/02/2024)   Received from [facility] - Transportation    In the past 12 months, has lack of transportation kept you from medical appointments or from getting medications?: No    Lack of Transportation (Non-Medical): No  Physical Activity: Not on file  Stress: Not on file  Social Connections: Not on file  Intimate Partner Violence: Not on file    FAMILY HISTORY: Family History  Problem Relation Age of Onset   Hypertension Mother    Colon cancer Mother    Prostate cancer Father    Kidney cancer Brother    Prostate cancer Brother    Prostate cancer Brother    Breast cancer Neg Hx     ALLERGIES:  is allergic to atorvastatin.  MEDICATIONS:  Current Outpatient Medications  Medication Sig Dispense Refill   amLODipine (NORVASC) 10 MG tablet Take 10 mg by mouth daily.      benazepril (LOTENSIN) 20 MG tablet Take 20 mg by mouth daily.     diphenhydramine-acetaminophen (TYLENOL PM) 25-500 MG TABS tablet Take 1 tablet by mouth at bedtime as needed.     folic acid (FOLVITE) 1 MG tablet TAKE 1 TABLET BY MOUTH DAILY 30 tablet  10   furosemide (LASIX) 20 MG tablet Take 20 mg by mouth.     pantoprazole (PROTONIX) 20 MG tablet Take 1 tablet (20 mg total) by mouth daily. 30 tablet 2   rosuvastatin (CRESTOR) 5 MG tablet Take 5 mg by mouth at bedtime.  2   sodium bicarbonate 650 MG tablet Take 650 mg by mouth 2 (two) times daily.     warfarin (COUMADIN) 4 MG tablet Take 4 mg by mouth See admin instructions.     amoxicillin (AMOXIL) 500 MG tablet Take 2,000 mg by mouth See admin instructions. Take 2000 mg by mouth one hour prior to dental procedures (Patient not taking: Reported on 09/28/2022)     anastrozole (ARIMIDEX) 1 MG tablet TAKE 1 TABLET(1 MG) BY MOUTH DAILY (Patient not taking: Reported on 02/06/2024) 30 tablet 3   anastrozole (ARIMIDEX) 1 MG tablet TAKE 1 TABLET(1 MG) BY MOUTH DAILY 90 tablet 1   No current facility-administered medications for this visit.     PHYSICAL EXAMINATION: ECOG PERFORMANCE STATUS: 1 - Symptomatic but completely ambulatory Vitals:   02/06/24 1117 02/06/24 1119  BP: (!) 159/80 (!) 148/69  Pulse: 64   Resp: 18   Temp: (!) 97.4 F (36.3 C)   SpO2: 100%    Filed Weights   02/06/24 1117  Weight: 159 lb 14.4 oz (72.5 kg)    Physical Exam Constitutional:      General: She is not in acute distress.    Appearance: She is not diaphoretic.  HENT:     Head: Normocephalic and atraumatic.  Eyes:     General: No scleral icterus.       Left eye: No discharge.  Neck:     Vascular: No JVD.  Cardiovascular:     Rate and Rhythm: Normal rate. Rhythm irregular.     Heart sounds: Murmur heard.  Pulmonary:     Effort: Pulmonary effort is normal. No respiratory distress.     Breath sounds: No wheezing.  Abdominal:     General: Bowel sounds are normal. There is no distension.     Palpations: Abdomen is soft.     Tenderness: There is no abdominal tenderness.  Musculoskeletal:        General: No tenderness. Normal range of motion.     Cervical back: Normal range of motion and neck  supple.     Right lower leg: Edema present.     Left lower leg: Edema present.  Lymphadenopathy:     Cervical: No cervical adenopathy.  Skin:    General: Skin is warm and dry.     Findings: No erythema or rash.  Neurological:     Mental Status: She is alert and oriented to person, place, and time. Mental status is at baseline.     Motor: No abnormal muscle tone.  Psychiatric:        Mood and Affect: Mood and affect normal.     LABORATORY DATA:  I have reviewed the data as listed    Latest Ref Rng & Units 02/06/2024   10:51 AM 01/09/2024   10:51 AM 12/21/2023   10:47 AM  CBC  WBC 4.0 - 10.5 K/uL 4.2     Hemoglobin 12.0 - 15.0 g/dL 9.0  9.7  9.4   Hematocrit 36.0 - 46.0 % 31.3  32.6  31.9   Platelets 150 - 400 K/uL 171         Latest Ref Rng & Units 02/06/2024   10:51 AM 04/27/2023    1:37 PM 09/28/2022    1:49 PM  CMP  Glucose 70 - 99 mg/dL  90  92   BUN 8 - 23 mg/dL  66  32   Creatinine 1.61 - 1.00 mg/dL  0.96  0.45   Sodium 409 - 145 mmol/L  139  142   Potassium 3.5 - 5.1 mmol/L  5.9  3.7   Chloride 98 - 111 mmol/L  110  110   CO2 22 - 32 mmol/L  22  25   Calcium 8.9 - 10.3 mg/dL  9.7  9.7   Total Protein 6.5 - 8.1 g/dL 6.6  7.1  7.0   Total Bilirubin 0.0 - 1.2 mg/dL 0.5  0.5  0.5   Alkaline Phos 38 - 126 U/L 125  106  126   AST 15 - 41 U/L 17  30  16    ALT 0 - 44 U/L 19  31  18       Iron/TIBC/Ferritin/ %Sat    Component Value Date/Time   IRON 43 02/06/2024 1051   TIBC 326 02/06/2024 1051   FERRITIN 132 02/06/2024 1051   IRONPCTSAT 13 02/06/2024 1051  RADIOGRAPHIC STUDIES: I have personally reviewed the radiological images as listed and agreed with the findings in the report. CT KNEE LEFT WO CONTRAST Result Date: 01/25/2024 CLINICAL DATA:  Loose orthopedic implant discomfort to the left knee EXAM: CT OF THE LEFT  KNEE WITHOUT CONTRAST TECHNIQUE: Multidetector CT imaging of the left knee was performed according to the standard protocol. Multiplanar CT image reconstructions were also generated. RADIATION DOSE REDUCTION: This exam was performed according to the departmental dose-optimization program which includes automated exposure control, adjustment of the mA and/or kV according to patient size and/or use of iterative reconstruction technique. COMPARISON:  Left knee x-rays September 29, 2010 FINDINGS: Bones/Joint/Cartilage Status post total left knee arthroplasty, components of the prosthesis in good position. Near anatomic alignment. Significant beam hardening artifact no significant loosening however, multilobulated, lytic expansile lesion involving the proximal metaphysis of the tibia just distal to the tip of the tibial components of the prosthesis measures 2.2 by 3.3 by 2.5 cm. With thinning and expansile appearance of the anterior tibial cortex and adjacent soft tissue thickening and prominence with some adjacent calcifications of the soft tissues. Findings called correlate with aggressive osteolysis Ligaments Suboptimally assessed by CT. Muscles and Tendons No evidence of muscle atrophy Impression IMPRESSION: *Status post total left knee arthroplasty, components of the prosthesis in good position. *Significant beam hardening artifact no significant loosening however, multilobulated, lytic expansile lesion involving the proximal metaphysis of the tibia just distal to the tip of the tibial components of the prosthesis measures 2.2 by 3.3 by 2.5 cm. With thinning and expansile appearance of the anterior tibial cortex and adjacent soft tissue thickening and prominence with some adjacent calcifications of the soft tissues. Findings called correlate with aggressive osteolysis. Electronically Signed   By: Shaaron Adler M.D.   On: 01/25/2024 10:34   MM 3D DIAGNOSTIC MAMMOGRAM BILATERAL BREAST Result Date: 01/23/2024 CLINICAL  DATA:  History of LEFT breast cancer status post lumpectomy and radiation. Lumpectomy was performed in April 2023. EXAM: DIGITAL DIAGNOSTIC BILATERAL MAMMOGRAM WITH TOMOSYNTHESIS AND CAD; ULTRASOUND LEFT BREAST LIMITED TECHNIQUE: Bilateral digital diagnostic mammography and breast tomosynthesis was performed. The images were evaluated with computer-aided detection. ; Targeted ultrasound examination of the left breast was performed. COMPARISON:  Previous exam(s). ACR Breast Density Category b: There are scattered areas of fibroglandular density. FINDINGS: There is density and architectural distortion within the LEFT breast, consistent with postsurgical changes. These are stable in comparison to prior with persistent rounded high density at the lumpectomy bed. In the LEFT axilla, there is an oval circumscribed mass, decreased in size in comparison to prior. Spot magnification views confirm benign rod type/secretory appearance of LEFT outer breast calcifications. Similar degree of diffuse skin thickening compared to prior, most consistent with post radiation change. No suspicious mass, distortion, or microcalcifications are identified to suggest presence of malignancy. On physical exam, there is hyperpigmentation and edema of the LEFT breast consistent with post radiation change. Targeted ultrasound was performed of the LEFT breast. There is revisualization of an anechoic mass with some internal isoechoic fat lobules at 11 o'clock 7 cm from the nipple. This measures 4.9 x 2.7 x 3.6 cm, previously 5.7 x 2.7 x 3.5 cm. This is consistent with a seroma. Targeted ultrasound was performed of the LEFT axilla. There is a oval anechoic mass posterior acoustic enhancement and a smooth peripheral rind. This measures 12 by 7 by 8 mm, previously 19 x 35 x 16 mm. This is consistent with a resolving LEFT axillary seroma and corresponds  to the oval mass noted mammographically. IMPRESSION: 1. There are benign post treatment changes  noted in the LEFT breast. Mild decrease in size of a LEFT breast seroma. Ultrasound-guided aspiration could be performed should patient desire. 2. No mammographic evidence of malignancy bilaterally. RECOMMENDATION: Recommend bilateral diagnostic mammogram (with RIGHT and LEFT breast ultrasound if deemed necessary) in 1 year. I have discussed the findings and recommendations with the patient. If applicable, a reminder letter will be sent to the patient regarding the next appointment. BI-RADS CATEGORY  2: Benign. Electronically Signed   By: Meda Klinefelter M.D.   On: 01/23/2024 11:39   [place] LIMITED ULTRASOUND INCLUDING AXILLA LEFT BREAST  Result Date: 01/23/2024 CLINICAL DATA:  History of LEFT breast cancer status post lumpectomy and radiation. Lumpectomy was performed in April 2023. EXAM: DIGITAL DIAGNOSTIC BILATERAL MAMMOGRAM WITH TOMOSYNTHESIS AND CAD; ULTRASOUND LEFT BREAST LIMITED TECHNIQUE: Bilateral digital diagnostic mammography and breast tomosynthesis was performed. The images were evaluated with computer-aided detection. ; Targeted ultrasound examination of the left breast was performed. COMPARISON:  Previous exam(s). ACR Breast Density Category b: There are scattered areas of fibroglandular density. FINDINGS: There is density and architectural distortion within the LEFT breast, consistent with postsurgical changes. These are stable in comparison to prior with persistent rounded high density at the lumpectomy bed. In the LEFT axilla, there is an oval circumscribed mass, decreased in size in comparison to prior. Spot magnification views confirm benign rod type/secretory appearance of LEFT outer breast calcifications. Similar degree of diffuse skin thickening compared to prior, most consistent with post radiation change. No suspicious mass, distortion, or microcalcifications are identified to suggest presence of malignancy. On physical exam, there is hyperpigmentation and edema of the LEFT breast  consistent with post radiation change. Targeted ultrasound was performed of the LEFT breast. There is revisualization of an anechoic mass with some internal isoechoic fat lobules at 11 o'clock 7 cm from the nipple. This measures 4.9 x 2.7 x 3.6 cm, previously 5.7 x 2.7 x 3.5 cm. This is consistent with a seroma. Targeted ultrasound was performed of the LEFT axilla. There is a oval anechoic mass posterior acoustic enhancement and a smooth peripheral rind. This measures 12 by 7 by 8 mm, previously 19 x 35 x 16 mm. This is consistent with a resolving LEFT axillary seroma and corresponds to the oval mass noted mammographically. IMPRESSION: 1. There are benign post treatment changes noted in the LEFT breast. Mild decrease in size of a LEFT breast seroma. Ultrasound-guided aspiration could be performed should patient desire. 2. No mammographic evidence of malignancy bilaterally. RECOMMENDATION: Recommend bilateral diagnostic mammogram (with RIGHT and LEFT breast ultrasound if deemed necessary) in 1 year. I have discussed the findings and recommendations with the patient. If applicable, a reminder letter will be sent to the patient regarding the next appointment. BI-RADS CATEGORY  2: Benign. Electronically Signed   By: Meda Klinefelter M.D.   On: 01/23/2024 11:39

## 2024-02-06 NOTE — Assessment & Plan Note (Signed)
 Patient is on aromatase inhibitor.

## 2024-02-06 NOTE — Assessment & Plan Note (Addendum)
 Left breast cancer, stage IA, pT1b N0 ER/PR positive, HER2 negative. Oncotype Dx was not sent due to patient being a poor candidate for chemotherapy due to multiple medical problems.She is not interested in chemotherapy as well Labs are reviewed and discussed with patient. Continue Arimidex 1mg  daily  Patient declines DEXA. Recommend calcium and vitamin D supplementation  Obtain annual diagnostic mammogram-February 2025 results were reviewed with patient.

## 2024-02-07 ENCOUNTER — Telehealth: Payer: Self-pay

## 2024-02-07 NOTE — Telephone Encounter (Signed)
-----   Message from Rickard Patience sent at 02/06/2024  7:20 PM EST ----- Please let patient know that I recommend Venofer weekly x 2 doses to further improve her iron level.  Please arrange. Thanks

## 2024-02-07 NOTE — Telephone Encounter (Signed)
 Mychart message sent to pt. Please inform pt of appt details.

## 2024-02-08 ENCOUNTER — Inpatient Hospital Stay: Payer: Medicare Other

## 2024-02-08 VITALS — BP 131/66 | HR 65 | Temp 96.0°F | Resp 17

## 2024-02-08 DIAGNOSIS — N184 Chronic kidney disease, stage 4 (severe): Secondary | ICD-10-CM | POA: Diagnosis not present

## 2024-02-08 DIAGNOSIS — D631 Anemia in chronic kidney disease: Secondary | ICD-10-CM

## 2024-02-08 MED ORDER — IRON SUCROSE 20 MG/ML IV SOLN
200.0000 mg | Freq: Once | INTRAVENOUS | Status: AC
  Administered 2024-02-08: 200 mg via INTRAVENOUS
  Filled 2024-02-08: qty 10

## 2024-02-08 NOTE — Patient Instructions (Signed)

## 2024-02-13 ENCOUNTER — Inpatient Hospital Stay: Payer: Medicare Other | Attending: Oncology

## 2024-02-13 VITALS — BP 128/65 | HR 51 | Temp 99.3°F | Resp 16

## 2024-02-13 DIAGNOSIS — E611 Iron deficiency: Secondary | ICD-10-CM | POA: Insufficient documentation

## 2024-02-13 DIAGNOSIS — D631 Anemia in chronic kidney disease: Secondary | ICD-10-CM | POA: Insufficient documentation

## 2024-02-13 DIAGNOSIS — N184 Chronic kidney disease, stage 4 (severe): Secondary | ICD-10-CM | POA: Insufficient documentation

## 2024-02-13 MED ORDER — IRON SUCROSE 20 MG/ML IV SOLN
200.0000 mg | Freq: Once | INTRAVENOUS | Status: AC
  Administered 2024-02-13: 200 mg via INTRAVENOUS
  Filled 2024-02-13: qty 10

## 2024-02-13 NOTE — Progress Notes (Signed)
 Pt tolerated treatment without concerns.  Vss.  Pt refused 30 minute post observation.  Pt understands risks.

## 2024-02-13 NOTE — Patient Instructions (Signed)

## 2024-02-27 ENCOUNTER — Inpatient Hospital Stay: Payer: Medicare Other

## 2024-02-27 VITALS — BP 132/69

## 2024-02-27 DIAGNOSIS — D631 Anemia in chronic kidney disease: Secondary | ICD-10-CM

## 2024-02-27 DIAGNOSIS — E611 Iron deficiency: Secondary | ICD-10-CM | POA: Diagnosis not present

## 2024-02-27 LAB — HEMOGLOBIN AND HEMATOCRIT (CANCER CENTER ONLY)
HCT: 31.6 % — ABNORMAL LOW (ref 36.0–46.0)
Hemoglobin: 9.2 g/dL — ABNORMAL LOW (ref 12.0–15.0)

## 2024-02-27 MED ORDER — EPOETIN ALFA-EPBX 40000 UNIT/ML IJ SOLN
40000.0000 [IU] | Freq: Once | INTRAMUSCULAR | Status: AC
  Administered 2024-02-27: 40000 [IU] via SUBCUTANEOUS
  Filled 2024-02-27: qty 1

## 2024-02-27 MED ORDER — EPOETIN ALFA-EPBX 20000 UNIT/ML IJ SOLN
20000.0000 [IU] | Freq: Once | INTRAMUSCULAR | Status: AC
  Administered 2024-02-27: 20000 [IU] via SUBCUTANEOUS
  Filled 2024-02-27: qty 1

## 2024-03-19 ENCOUNTER — Inpatient Hospital Stay: Payer: Medicare Other

## 2024-03-19 ENCOUNTER — Inpatient Hospital Stay: Payer: Medicare Other | Attending: Oncology

## 2024-03-19 VITALS — BP 113/65 | HR 58 | Temp 97.7°F | Resp 24

## 2024-03-19 DIAGNOSIS — D631 Anemia in chronic kidney disease: Secondary | ICD-10-CM | POA: Insufficient documentation

## 2024-03-19 DIAGNOSIS — N184 Chronic kidney disease, stage 4 (severe): Secondary | ICD-10-CM | POA: Insufficient documentation

## 2024-03-19 LAB — HEMOGLOBIN AND HEMATOCRIT (CANCER CENTER ONLY)
HCT: 30.5 % — ABNORMAL LOW (ref 36.0–46.0)
Hemoglobin: 9 g/dL — ABNORMAL LOW (ref 12.0–15.0)

## 2024-03-19 MED ORDER — EPOETIN ALFA-EPBX 10000 UNIT/ML IJ SOLN
20000.0000 [IU] | Freq: Once | INTRAMUSCULAR | Status: AC
  Administered 2024-03-19: 20000 [IU] via SUBCUTANEOUS
  Filled 2024-03-19: qty 2

## 2024-03-19 MED ORDER — IRON SUCROSE 20 MG/ML IV SOLN: 200.0000 mg | Freq: Once | INTRAVENOUS | Status: DC

## 2024-03-19 MED ORDER — EPOETIN ALFA-EPBX 40000 UNIT/ML IJ SOLN
40000.0000 [IU] | Freq: Once | INTRAMUSCULAR | Status: AC
  Administered 2024-03-19: 40000 [IU] via SUBCUTANEOUS
  Filled 2024-03-19: qty 1

## 2024-04-09 ENCOUNTER — Inpatient Hospital Stay: Payer: Medicare Other

## 2024-04-09 VITALS — BP 124/84

## 2024-04-09 DIAGNOSIS — N184 Chronic kidney disease, stage 4 (severe): Secondary | ICD-10-CM

## 2024-04-09 LAB — HEMOGLOBIN AND HEMATOCRIT (CANCER CENTER ONLY)
HCT: 28.7 % — ABNORMAL LOW (ref 36.0–46.0)
Hemoglobin: 8.6 g/dL — ABNORMAL LOW (ref 12.0–15.0)

## 2024-04-09 MED ORDER — EPOETIN ALFA-EPBX 10000 UNIT/ML IJ SOLN
20000.0000 [IU] | Freq: Once | INTRAMUSCULAR | Status: AC
  Administered 2024-04-09: 20000 [IU] via SUBCUTANEOUS
  Filled 2024-04-09: qty 2

## 2024-04-09 MED ORDER — EPOETIN ALFA-EPBX 40000 UNIT/ML IJ SOLN
40000.0000 [IU] | Freq: Once | INTRAMUSCULAR | Status: AC
  Administered 2024-04-09: 40000 [IU] via SUBCUTANEOUS
  Filled 2024-04-09: qty 1

## 2024-04-09 NOTE — Progress Notes (Signed)
 Notified Dr. Wilhelmenia Harada that hgb 8.6 today with only sx of possible increase of SOB. Her hgb has been in 9 range since 08/2023.  She scheduled to come back 5/22 for inj.  Dr. Wilhelmenia Harada would like patient next appt be in 3 weeks instead of 4.  Patient aware and scheduler will call her with updated appt date/time

## 2024-04-18 ENCOUNTER — Other Ambulatory Visit: Payer: Self-pay | Admitting: Oncology

## 2024-04-22 ENCOUNTER — Encounter: Payer: Self-pay | Admitting: Oncology

## 2024-04-23 ENCOUNTER — Inpatient Hospital Stay: Attending: Oncology

## 2024-04-23 DIAGNOSIS — N184 Chronic kidney disease, stage 4 (severe): Secondary | ICD-10-CM | POA: Insufficient documentation

## 2024-04-23 DIAGNOSIS — D631 Anemia in chronic kidney disease: Secondary | ICD-10-CM | POA: Diagnosis present

## 2024-04-23 DIAGNOSIS — C50919 Malignant neoplasm of unspecified site of unspecified female breast: Secondary | ICD-10-CM

## 2024-04-23 LAB — CBC WITH DIFFERENTIAL (CANCER CENTER ONLY)
Abs Immature Granulocytes: 0.01 10*3/uL (ref 0.00–0.07)
Basophils Absolute: 0 10*3/uL (ref 0.0–0.1)
Basophils Relative: 1 %
Eosinophils Absolute: 0.1 10*3/uL (ref 0.0–0.5)
Eosinophils Relative: 2 %
HCT: 28.3 % — ABNORMAL LOW (ref 36.0–46.0)
Hemoglobin: 8.4 g/dL — ABNORMAL LOW (ref 12.0–15.0)
Immature Granulocytes: 0 %
Lymphocytes Relative: 10 %
Lymphs Abs: 0.4 10*3/uL — ABNORMAL LOW (ref 0.7–4.0)
MCH: 29.6 pg (ref 26.0–34.0)
MCHC: 29.7 g/dL — ABNORMAL LOW (ref 30.0–36.0)
MCV: 99.6 fL (ref 80.0–100.0)
Monocytes Absolute: 0.4 10*3/uL (ref 0.1–1.0)
Monocytes Relative: 9 %
Neutro Abs: 3.3 10*3/uL (ref 1.7–7.7)
Neutrophils Relative %: 78 %
Platelet Count: 145 10*3/uL — ABNORMAL LOW (ref 150–400)
RBC: 2.84 MIL/uL — ABNORMAL LOW (ref 3.87–5.11)
RDW: 16.9 % — ABNORMAL HIGH (ref 11.5–15.5)
WBC Count: 4.2 10*3/uL (ref 4.0–10.5)
nRBC: 0 % (ref 0.0–0.2)

## 2024-04-23 LAB — RETIC PANEL
Immature Retic Fract: 11.6 % (ref 2.3–15.9)
RBC.: 2.82 MIL/uL — ABNORMAL LOW (ref 3.87–5.11)
Retic Count, Absolute: 90.2 10*3/uL (ref 19.0–186.0)
Retic Ct Pct: 3.2 % — ABNORMAL HIGH (ref 0.4–3.1)
Reticulocyte Hemoglobin: 27.9 pg — ABNORMAL LOW (ref >27.9)

## 2024-04-23 LAB — FERRITIN: Ferritin: 135 ng/mL (ref 11–307)

## 2024-04-23 LAB — IRON AND TIBC
Iron: 42 ug/dL (ref 28–170)
Saturation Ratios: 15 % (ref 10.4–31.8)
TIBC: 290 ug/dL (ref 250–450)
UIBC: 248 ug/dL

## 2024-04-25 ENCOUNTER — Inpatient Hospital Stay

## 2024-04-25 ENCOUNTER — Encounter: Payer: Self-pay | Admitting: Oncology

## 2024-04-25 ENCOUNTER — Inpatient Hospital Stay: Admitting: Oncology

## 2024-04-25 VITALS — BP 122/70 | HR 69 | Temp 97.7°F | Resp 18 | Wt 160.9 lb

## 2024-04-25 DIAGNOSIS — N6099 Unspecified benign mammary dysplasia of unspecified breast: Secondary | ICD-10-CM | POA: Diagnosis not present

## 2024-04-25 DIAGNOSIS — N184 Chronic kidney disease, stage 4 (severe): Secondary | ICD-10-CM

## 2024-04-25 DIAGNOSIS — C50919 Malignant neoplasm of unspecified site of unspecified female breast: Secondary | ICD-10-CM | POA: Diagnosis not present

## 2024-04-25 DIAGNOSIS — D631 Anemia in chronic kidney disease: Secondary | ICD-10-CM

## 2024-04-25 MED ORDER — ANASTROZOLE 1 MG PO TABS: ORAL_TABLET | ORAL | 1 refills | Status: DC

## 2024-04-25 MED ORDER — EPOETIN ALFA-EPBX 20000 UNIT/ML IJ SOLN
20000.0000 [IU] | Freq: Once | INTRAMUSCULAR | Status: AC
  Administered 2024-04-25: 20000 [IU] via SUBCUTANEOUS
  Filled 2024-04-25: qty 1

## 2024-04-25 MED ORDER — EPOETIN ALFA-EPBX 40000 UNIT/ML IJ SOLN
40000.0000 [IU] | Freq: Once | INTRAMUSCULAR | Status: AC
  Administered 2024-04-25: 40000 [IU] via SUBCUTANEOUS
  Filled 2024-04-25: qty 1

## 2024-04-25 NOTE — Assessment & Plan Note (Signed)
 Labs are reviewed and discussed with patient. Lab Results  Component Value Date   HGB 8.4 (L) 04/23/2024   TIBC 290 04/23/2024   IRONPCTSAT 15 04/23/2024   FERRITIN 135 04/23/2024    Ferritin is <200, recommend IV venofer  x 3 Q3 weeks Retacrit  60,000 if hemoglobin is less than 10.

## 2024-04-25 NOTE — Progress Notes (Signed)
 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 Oncotype Dx was not sent due to patient being a poor candidate for chemotherapy due to multiple medical problems.She is not interested in chemotherapy as well Labs are reviewed and discussed with patient. Continue Arimidex  1mg  daily  Patient declines DEXA. Recommend calcium  and vitamin D supplementation Obtain annual diagnostic mammogram-February 2025 results were reviewed with patient.   Anemia of chronic renal failure Labs are reviewed and discussed with patient. Lab Results  Component Value Date   HGB 8.4 (L) 04/23/2024   TIBC 290 04/23/2024   IRONPCTSAT 15 04/23/2024   FERRITIN 135 04/23/2024    Ferritin is <200, recommend IV venofer  x 3 Q3 weeks Retacrit  60,000 if hemoglobin is less than 10.   Atypical lobular hyperplasia [facility]) of breast Patient is on aromatase inhibitor.   Orders Placed This Encounter  Procedures   CBC with Differential (Cancer Center Only)    Standing Status:   Future    Expected Date:   06/25/2024    Expiration Date:   04/25/2025   Iron  and TIBC    Standing Status:   Future    Expected Date:   06/25/2024    Expiration Date:   04/25/2025   Ferritin    Standing Status:   Future    Expected Date:   06/25/2024    Expiration Date:   04/25/2025   Retic Panel    Standing Status:   Future    Expected Date:   06/25/2024    Expiration Date:    04/25/2025   Hemoglobin and Hematocrit (Cancer Center Only)    Standing Status:   Future    Expected Date:   06/27/2024    Expiration Date:   04/25/2025   Hemoglobin and Hematocrit (Cancer Center Only)    Standing Status:   Future    Expected Date:   06/06/2024    Expiration Date:   04/25/2025   Hemoglobin and Hematocrit (Cancer Center Only)    Standing Status:   Future    Expected Date:   05/16/2024    Expiration Date:   04/25/2025   Follow up  Venofer  x 3 Lab H&H in 3 weeks, 6 weeks, 9 weeks +/- retacrit   Follow up in 3 months, labs prior to MD +/- Venofer  +/- reatcrit  CBC, Iron , TIBC Ferritin, retic panel   All questions were answered. The patient knows to call the clinic with any problems, questions or concerns.  Timmy Forbes, MD, PhD [facility] Hematology Oncology 04/25/2024    REASON FOR VISIT Follow up for breast cancer, anemia secondary to chronic kidney disease, chronic anticoagulation  HISTORY OF PRESENTING ILLNESS:  Patient presents for follow-up of breast cancer, anemia secondary to chronic kidney disease, chronic anticoagulation Oncology history summary listed as below. Oncology History  Invasive carcinoma of breast (HCC)  02/08/2022 Mammogram   Unilateral diagnostic mammogram /us  1. Suspicious mass left breast 10:30 o'clock. 2. Anterior to this suspicious mass  on the true lateral view is an additional small lobular mass which is not visualized on ultrasound.     02/28/2022 Initial Diagnosis   Invasive carcinoma of breast (HCC)   03/14/2022 Surgery    patient is status post left breast lumpectomy Pathology showed left mammary carcinoma, no special type, 2 sentinel lymph nodes were excised and both were negative.  Margins are negative for invasive carcinoma. pT1b pN0.  Previous biopsy showed ER positive, PR positive HER2 negative   03/31/2022 Cancer Staging   Staging form: Breast, AJCC 8th Edition - Pathologic stage from 03/31/2022: Stage IA (pT1b, pN0, cM0, G1, ER+,  PR+, HER2-) - Signed by Timmy Forbes, MD on 03/31/2022 Stage prefix: Initial diagnosis Multigene prognostic tests performed: None Histologic grading system: 3 grade system   04/25/2022 - 05/24/2022 Radiation Therapy   S/p adjuvant breast radiation    Genetic Testing   Patient declined genetic testing   05/26/2022 -  Anti-estrogen oral therapy   Started Arimidex      INTERVAL HISTORY 73 y.o. female  presents for follow-up for evaluation of breast cancer, anemia due to chronic kidney disease, chronic anticoagulation. Patient currently is on Arimidex  1mg  daily overall tolerates well.  Manageable side effects.   Patient reports feeling well.  No new complaints. + fatigue stable    Review of Systems  Constitutional:  Positive for malaise/fatigue. Negative for chills, fever and weight loss.  HENT:  Negative for sore throat.   Eyes:  Negative for redness.  Respiratory:  Negative for cough, shortness of breath and wheezing.   Cardiovascular:  Negative for chest pain, palpitations and leg swelling.  Gastrointestinal:  Negative for abdominal pain, blood in stool, nausea and vomiting.  Genitourinary:  Negative for dysuria.  Musculoskeletal:  Negative for myalgias.  Skin:  Negative for rash.  Neurological:  Negative for dizziness, tingling and tremors.  Endo/Heme/Allergies:  Does not bruise/bleed easily.  Psychiatric/Behavioral:  Negative for hallucinations.     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 Jude mechanical valve 09/19/2003   Arthritis    Breast cancer, left (HCC)  02/22/2022   a.) CNB 02/22/2022 (+) for invasive mammary carcinoma; stage IA (cT1b, cN0, cM0, G1, ER+, PR+, HER2-).   Cerebrovascular accident (CVA) due to embolism of other precerebral artery (HCC)    CKD (chronic kidney disease) stage 4, GFR 15-29 ml/min (HCC)    Coronary artery disease    a.) LHC 05/07/2015: 50% RPDA, 50% inf septal, 50% mLCx, 50% pLAD, 40% mRCA; intervention deferred opting for medical mgmt.   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 b.) s/p DCCV 2016 and TEE with cardioversion  09/2018. 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   a.) 28 mm Hemashield like graft    SURGICAL HISTORY: Past Surgical History:  Procedure Laterality Date   ABDOMINAL HYSTERECTOMY     AORTIC VALVE REPLACEMENT N/A 09/19/2003   BREAST BIOPSY Right 01/29/2021   US  Bx, 6:00 vision clip --> sclerosing intraductal papilloma; (-) for atypia and malignancy   BREAST BIOPSY Right 01/29/2021   US  Bx, 8:00 Ribbon clip --> usual ductal hyperplasia; (-) for atypia and malignancy   BREAST LUMPECTOMY Left 03/15/2022   CARDIAC CATHETERIZATION N/A 05/07/2015   Procedure: Left Heart Cath;  Surgeon: Cherrie Cornwall, MD;  Location: [facility] INVASIVE CV LAB;  Service: Cardiovascular;  Laterality: N/A;    COLONOSCOPY Left 02/20/2019   Procedure: COLONOSCOPY;  Surgeon: Irby Mannan, MD;  Location: [facility] ENDOSCOPY;  Service: Endoscopy;  Laterality: Left;   COLONOSCOPY N/A 02/21/2019   Procedure: COLONOSCOPY;  Surgeon: Irby Mannan, MD;  Location: [facility] ENDOSCOPY;  Service: Endoscopy;  Laterality: N/A;   ELECTROPHYSIOLOGIC STUDY N/A 05/05/2015   Procedure: Cardioversion;  Surgeon: Cherrie Cornwall, MD;  Location: [facility] ORS;  Service: Cardiovascular;  Laterality: N/A;   ENTEROSCOPY N/A 02/21/2019   Procedure: ENTEROSCOPY;  Surgeon: Irby Mannan, MD;  Location: [facility] ENDOSCOPY;  Service: Endoscopy;  Laterality: N/A;   ESOPHAGOGASTRODUODENOSCOPY Left 02/19/2019   Procedure: ESOPHAGOGASTRODUODENOSCOPY (EGD);  Surgeon: Irby Mannan, MD;  Location: [facility] ENDOSCOPY;  Service: Endoscopy;  Laterality: Left;   EXCISION OF BREAST BIOPSY Right 03/01/2021   Procedure: EXCISION OF BREAST BIOPSY w/ radiofrequency tag;  Surgeon: Eldred Grego, MD;  Location: [facility] ORS;  Service: General;  Laterality: Right;   PARTIAL MASTECTOMY WITH NEEDLE LOCALIZATION AND AXILLARY SENTINEL LYMPH NODE BX Left 03/14/2022   Procedure: PARTIAL MASTECTOMY WITH Radio Frequency tag AND AXILLARY SENTINEL LYMPH NODE BX;  Surgeon: Eldred Grego, MD;  Location: [facility] ORS;  Service: General;  Laterality: Left;   REPAIR OF ACUTE ASCENDING THORACIC AORTIC DISSECTION N/A 06/08/2003   Procedure: ASCENDING AORTIC DISSECTION REPAIR AND RESUSPENSION OF AORTIC VALVE (28 mm Hemashield like graft); Location: [facility]; Surgeon: Princeton Broom, MD   REPLACEMENT TOTAL KNEE BILATERAL     TEE WITH CARDIOVERSION  09/26/2018    SOCIAL HISTORY: Social History   Socioeconomic History   Marital status: Legally Separated    Spouse name: Not on file   Number of children: Not on file   Years of education: Not on file   Highest education level: Not on file  Occupational History   Not on file  Tobacco Use   Smoking  status: Former    Current packs/day: 0.00    Average packs/day: 0.5 packs/day for 20.0 years (10.0 ttl pk-yrs)    Types: Cigarettes    Start date: 63    Quit date: 2004    Years since quitting: 21.3   Smokeless tobacco: Never  Vaping Use   Vaping status: Never Used  Substance and Sexual Activity   Alcohol use: Yes    Alcohol/week: 0.0 standard drinks of alcohol    Comment: occas   Drug use: No   Sexual  activity: Not on file  Other Topics Concern   Not on file  Social History Narrative   Not on file   Social Drivers of Health   Financial Resource Strain: Low Risk  (03/11/2024)   Received from [facility]   Overall Financial Resource Strain (CARDIA)    Difficulty of Paying Living Expenses: Not hard at all  Food Insecurity: No Food Insecurity (03/11/2024)   Received from [facility]   Hunger Vital Sign    Worried About Running Out of Food in the Last Year: Never true    Ran Out of Food in the Last Year: Never true  Transportation Needs: No Transportation Needs (03/11/2024)   Received from [facility] - Transportation    In the past 12 months, has lack of transportation kept you from medical appointments or from getting medications?: No    Lack of Transportation (Non-Medical): No  Physical Activity: Not on file  Stress: Not on file  Social Connections: Not on file  Intimate Partner Violence: Not on file    FAMILY HISTORY: Family History  Problem Relation Age of Onset   Hypertension Mother    Colon cancer Mother    Prostate cancer Father    Kidney cancer Brother    Prostate cancer Brother    Prostate cancer Brother    Breast cancer Neg Hx     ALLERGIES:  is allergic to atorvastatin .  MEDICATIONS:  Current Outpatient Medications  Medication Sig Dispense Refill   allopurinol (ZYLOPRIM) 100 MG tablet Take 100 mg by mouth at bedtime.     amiodarone  (PACERONE ) 200 MG tablet Take 200 mg by mouth daily.      amLODipine  (NORVASC ) 10 MG tablet Take 10 mg by mouth daily.      diphenhydramine -acetaminophen  (TYLENOL  PM) 25-500 MG TABS tablet Take 1 tablet by mouth at bedtime as needed.     escitalopram (LEXAPRO) 10 MG tablet Take 10 mg by mouth daily.     folic acid  (FOLVITE ) 1 MG tablet TAKE 1 TABLET BY MOUTH DAILY 30 tablet 11   hydrALAZINE  (APRESOLINE ) 25 MG tablet Take 1 tablet by mouth 3 (three) times daily.     isosorbide dinitrate (ISORDIL) 10 MG tablet Take 10 mg by mouth 3 (three) times daily.     methimazole  (TAPAZOLE ) 5 MG tablet Take 5 mg by mouth daily.     metoprolol  succinate (TOPROL -XL) 25 MG 24 hr tablet Take 12.5 mg by mouth daily.     pantoprazole  (PROTONIX ) 20 MG tablet Take 1 tablet (20 mg total) by mouth daily. 30 tablet 2   rosuvastatin  (CRESTOR ) 5 MG tablet Take 5 mg by mouth at bedtime.  2   sodium bicarbonate  650 MG tablet Take 650 mg by mouth 2 (two) times daily.     torsemide (DEMADEX) 20 MG tablet Take 60 mg by mouth daily.     Vitamin D, Ergocalciferol, (DRISDOL) 1.25 MG (50000 UNIT) CAPS capsule Take 50,000 Units by mouth once a week.     warfarin (COUMADIN ) 4 MG tablet Take 4 mg by mouth daily.     amoxicillin (AMOXIL) 500 MG tablet Take 2,000 mg by mouth See admin instructions. Take 2000 mg by mouth one hour prior to dental procedures (Patient not taking: Reported on 09/28/2022)     anastrozole  (ARIMIDEX ) 1 MG tablet TAKE 1 TABLET(1 MG) BY MOUTH DAILY 90 tablet 1   No current facility-administered medications for this visit.   Facility-Administered Medications  Ordered in Other Visits  Medication Dose Route Frequency Provider Last Rate Last Admin   iron  sucrose (VENOFER ) injection 200 mg  200 mg Intravenous Once Timmy Forbes, MD         PHYSICAL EXAMINATION: ECOG PERFORMANCE STATUS: 1 - Symptomatic but completely ambulatory Vitals:   04/25/24 1109  BP: 122/70  Pulse: 69  Resp: 18  Temp: 97.7 F (36.5 C)   Filed Weights   04/25/24 1109  Weight: 160 lb 14.4 oz  (73 kg)    Physical Exam Constitutional:      General: She is not in acute distress.    Appearance: She is not diaphoretic.  HENT:     Head: Normocephalic and atraumatic.  Eyes:     General: No scleral icterus.       Left eye: No discharge.  Neck:     Vascular: No JVD.  Cardiovascular:     Rate and Rhythm: Normal rate. Rhythm irregular.     Heart sounds: Murmur heard.  Pulmonary:     Effort: Pulmonary effort is normal. No respiratory distress.     Breath sounds: No wheezing.  Abdominal:     General: Bowel sounds are normal. There is no distension.     Palpations: Abdomen is soft.     Tenderness: There is no abdominal tenderness.  Musculoskeletal:        General: No tenderness. Normal range of motion.     Cervical back: Normal range of motion and neck supple.     Right lower leg: Edema present.     Left lower leg: Edema present.  Lymphadenopathy:     Cervical: No cervical adenopathy.  Skin:    General: Skin is warm and dry.     Findings: No erythema or rash.  Neurological:     Mental Status: She is alert and oriented to person, place, and time. Mental status is at baseline.     Motor: No abnormal muscle tone.  Psychiatric:        Mood and Affect: Mood and affect normal.     LABORATORY DATA:  I have reviewed the data as listed    Latest Ref Rng & Units 04/23/2024   10:46 AM 04/09/2024   10:20 AM 03/19/2024   10:29 AM  CBC  WBC 4.0 - 10.5 K/uL 4.2     Hemoglobin 12.0 - 15.0 g/dL 8.4  8.6  9.0   Hematocrit 36.0 - 46.0 % 28.3  28.7  30.5   Platelets 150 - 400 K/uL 145         Latest Ref Rng & Units 02/06/2024   10:51 AM 04/27/2023    1:37 PM 09/28/2022    1:49 PM  CMP  Glucose 70 - 99 mg/dL  90  92   BUN 8 - 23 mg/dL  66  32   Creatinine 1.61 - 1.00 mg/dL  0.96  0.45   Sodium 409 - 145 mmol/L  139  142   Potassium 3.5 - 5.1 mmol/L  5.9  3.7   Chloride 98 - 111 mmol/L  110  110   CO2 22 - 32 mmol/L  22  25   Calcium  8.9 - 10.3 mg/dL  9.7  9.7   Total Protein  6.5 - 8.1 g/dL 6.6  7.1  7.0   Total Bilirubin 0.0 - 1.2 mg/dL 0.5  0.5  0.5   Alkaline Phos 38 - 126 U/L 125  106  126   AST 15 - 41 U/L 17  30  16   ALT 0 - 44 U/L 19  31  18       Iron /TIBC/Ferritin/ %Sat    Component Value Date/Time   IRON  42 04/23/2024 1046   TIBC 290 04/23/2024 1046   FERRITIN 135 04/23/2024 1046   IRONPCTSAT 15 04/23/2024 1046                                                                                                                                                        RADIOGRAPHIC STUDIES: I have personally reviewed the radiological images as listed and agreed with the findings in the report. No results found.

## 2024-04-25 NOTE — Assessment & Plan Note (Signed)
 Patient is on aromatase inhibitor.

## 2024-04-25 NOTE — Assessment & Plan Note (Addendum)
 Left breast cancer, stage IA, pT1b N0 ER/PR positive, HER2 negative. Oncotype Dx was not sent due to patient being a poor candidate for chemotherapy due to multiple medical problems.She is not interested in chemotherapy as well Labs are reviewed and discussed with patient. Continue Arimidex 1mg  daily  Patient declines DEXA. Recommend calcium and vitamin D supplementation  Obtain annual diagnostic mammogram-February 2025 results were reviewed with patient.

## 2024-04-30 ENCOUNTER — Other Ambulatory Visit: Payer: Medicare Other

## 2024-04-30 ENCOUNTER — Inpatient Hospital Stay

## 2024-04-30 VITALS — BP 120/65 | HR 77 | Temp 97.0°F | Resp 16

## 2024-04-30 DIAGNOSIS — D631 Anemia in chronic kidney disease: Secondary | ICD-10-CM

## 2024-04-30 DIAGNOSIS — N184 Chronic kidney disease, stage 4 (severe): Secondary | ICD-10-CM | POA: Diagnosis not present

## 2024-04-30 MED ORDER — IRON SUCROSE 20 MG/ML IV SOLN
200.0000 mg | Freq: Once | INTRAVENOUS | Status: AC
  Administered 2024-04-30: 200 mg via INTRAVENOUS
  Filled 2024-04-30: qty 10

## 2024-04-30 NOTE — Patient Instructions (Signed)

## 2024-05-02 ENCOUNTER — Ambulatory Visit: Payer: Medicare Other

## 2024-05-02 ENCOUNTER — Ambulatory Visit: Payer: Medicare Other | Admitting: Oncology

## 2024-05-16 ENCOUNTER — Inpatient Hospital Stay: Attending: Oncology

## 2024-05-16 ENCOUNTER — Ambulatory Visit

## 2024-05-16 ENCOUNTER — Other Ambulatory Visit

## 2024-05-16 ENCOUNTER — Inpatient Hospital Stay

## 2024-05-16 VITALS — BP 127/76 | HR 70 | Temp 97.9°F | Resp 16

## 2024-05-16 DIAGNOSIS — D631 Anemia in chronic kidney disease: Secondary | ICD-10-CM | POA: Diagnosis present

## 2024-05-16 DIAGNOSIS — N184 Chronic kidney disease, stage 4 (severe): Secondary | ICD-10-CM | POA: Insufficient documentation

## 2024-05-16 LAB — HEMOGLOBIN AND HEMATOCRIT (CANCER CENTER ONLY)
HCT: 30.5 % — ABNORMAL LOW (ref 36.0–46.0)
Hemoglobin: 9 g/dL — ABNORMAL LOW (ref 12.0–15.0)

## 2024-05-16 MED ORDER — IRON SUCROSE 20 MG/ML IV SOLN
200.0000 mg | Freq: Once | INTRAVENOUS | Status: AC
  Administered 2024-05-16: 200 mg via INTRAVENOUS

## 2024-05-16 NOTE — Progress Notes (Signed)
 Declined post-observation. Aware of risks. Vitals stable at discharge.

## 2024-05-16 NOTE — Patient Instructions (Signed)

## 2024-06-06 ENCOUNTER — Other Ambulatory Visit: Payer: Self-pay

## 2024-06-06 ENCOUNTER — Inpatient Hospital Stay

## 2024-06-06 ENCOUNTER — Other Ambulatory Visit: Payer: Self-pay | Admitting: Oncology

## 2024-06-06 VITALS — BP 129/74 | HR 65 | Temp 97.8°F | Resp 17

## 2024-06-06 DIAGNOSIS — N184 Chronic kidney disease, stage 4 (severe): Secondary | ICD-10-CM | POA: Diagnosis not present

## 2024-06-06 DIAGNOSIS — D649 Anemia, unspecified: Secondary | ICD-10-CM

## 2024-06-06 LAB — HEMOGLOBIN AND HEMATOCRIT (CANCER CENTER ONLY)
HCT: 26.5 % — ABNORMAL LOW (ref 36.0–46.0)
Hemoglobin: 7.9 g/dL — ABNORMAL LOW (ref 12.0–15.0)

## 2024-06-06 LAB — PREPARE RBC (CROSSMATCH)

## 2024-06-06 MED ORDER — EPOETIN ALFA-EPBX 40000 UNIT/ML IJ SOLN
40000.0000 [IU] | Freq: Once | INTRAMUSCULAR | Status: AC
  Administered 2024-06-06: 40000 [IU] via SUBCUTANEOUS
  Filled 2024-06-06: qty 1

## 2024-06-06 MED ORDER — EPOETIN ALFA-EPBX 20000 UNIT/ML IJ SOLN
20000.0000 [IU] | Freq: Once | INTRAMUSCULAR | Status: AC
  Administered 2024-06-06: 20000 [IU] via SUBCUTANEOUS
  Filled 2024-06-06: qty 1

## 2024-06-06 NOTE — Progress Notes (Signed)
 MD notified hgb 7.9 she is slight SOB no other symptoms. Lungs clear. MD recommendations: NO Venofer  and retracrit today & blood tomorrow. Patient aware and verbalizes understanding

## 2024-06-07 ENCOUNTER — Inpatient Hospital Stay

## 2024-06-07 DIAGNOSIS — D649 Anemia, unspecified: Secondary | ICD-10-CM

## 2024-06-07 DIAGNOSIS — N184 Chronic kidney disease, stage 4 (severe): Secondary | ICD-10-CM | POA: Diagnosis not present

## 2024-06-07 MED ORDER — DIPHENHYDRAMINE HCL 25 MG PO CAPS
25.0000 mg | ORAL_CAPSULE | Freq: Once | ORAL | Status: AC
  Administered 2024-06-07: 25 mg via ORAL
  Filled 2024-06-07: qty 1

## 2024-06-07 MED ORDER — ACETAMINOPHEN 325 MG PO TABS
650.0000 mg | ORAL_TABLET | Freq: Once | ORAL | Status: AC
  Administered 2024-06-07: 650 mg via ORAL
  Filled 2024-06-07: qty 2

## 2024-06-07 MED ORDER — SODIUM CHLORIDE 0.9% IV SOLUTION
250.0000 mL | INTRAVENOUS | Status: DC
  Administered 2024-06-07: 100 mL via INTRAVENOUS
  Filled 2024-06-07: qty 250

## 2024-06-08 LAB — TYPE AND SCREEN
ABO/RH(D): B POS
Antibody Screen: NEGATIVE
Unit division: 0

## 2024-06-08 LAB — BPAM RBC
Blood Product Expiration Date: 202507292359
ISSUE DATE / TIME: 202506271118
Unit Type and Rh: 7300

## 2024-06-26 ENCOUNTER — Other Ambulatory Visit: Payer: Self-pay

## 2024-06-26 DIAGNOSIS — D631 Anemia in chronic kidney disease: Secondary | ICD-10-CM

## 2024-06-26 DIAGNOSIS — D649 Anemia, unspecified: Secondary | ICD-10-CM

## 2024-06-27 ENCOUNTER — Inpatient Hospital Stay

## 2024-06-27 ENCOUNTER — Ambulatory Visit: Payer: Medicare Other | Admitting: Radiation Oncology

## 2024-06-27 ENCOUNTER — Inpatient Hospital Stay: Attending: Oncology

## 2024-07-16 ENCOUNTER — Inpatient Hospital Stay: Attending: Oncology

## 2024-07-16 DIAGNOSIS — D631 Anemia in chronic kidney disease: Secondary | ICD-10-CM | POA: Insufficient documentation

## 2024-07-16 DIAGNOSIS — Z1721 Progesterone receptor positive status: Secondary | ICD-10-CM | POA: Insufficient documentation

## 2024-07-16 DIAGNOSIS — Z170 Estrogen receptor positive status [ER+]: Secondary | ICD-10-CM | POA: Insufficient documentation

## 2024-07-16 DIAGNOSIS — Z87891 Personal history of nicotine dependence: Secondary | ICD-10-CM | POA: Diagnosis not present

## 2024-07-16 DIAGNOSIS — Z1732 Human epidermal growth factor receptor 2 negative status: Secondary | ICD-10-CM | POA: Insufficient documentation

## 2024-07-16 DIAGNOSIS — I130 Hypertensive heart and chronic kidney disease with heart failure and stage 1 through stage 4 chronic kidney disease, or unspecified chronic kidney disease: Secondary | ICD-10-CM | POA: Insufficient documentation

## 2024-07-16 DIAGNOSIS — N184 Chronic kidney disease, stage 4 (severe): Secondary | ICD-10-CM | POA: Diagnosis present

## 2024-07-16 DIAGNOSIS — C50212 Malignant neoplasm of upper-inner quadrant of left female breast: Secondary | ICD-10-CM | POA: Diagnosis not present

## 2024-07-16 DIAGNOSIS — Z79811 Long term (current) use of aromatase inhibitors: Secondary | ICD-10-CM | POA: Insufficient documentation

## 2024-07-16 DIAGNOSIS — Z9012 Acquired absence of left breast and nipple: Secondary | ICD-10-CM | POA: Insufficient documentation

## 2024-07-16 LAB — CBC WITH DIFFERENTIAL (CANCER CENTER ONLY)
Abs Immature Granulocytes: 0.05 K/uL (ref 0.00–0.07)
Basophils Absolute: 0 K/uL (ref 0.0–0.1)
Basophils Relative: 1 %
Eosinophils Absolute: 0.1 K/uL (ref 0.0–0.5)
Eosinophils Relative: 2 %
HCT: 27.9 % — ABNORMAL LOW (ref 36.0–46.0)
Hemoglobin: 8.5 g/dL — ABNORMAL LOW (ref 12.0–15.0)
Immature Granulocytes: 1 %
Lymphocytes Relative: 7 %
Lymphs Abs: 0.5 K/uL — ABNORMAL LOW (ref 0.7–4.0)
MCH: 29.3 pg (ref 26.0–34.0)
MCHC: 30.5 g/dL (ref 30.0–36.0)
MCV: 96.2 fL (ref 80.0–100.0)
Monocytes Absolute: 0.4 K/uL (ref 0.1–1.0)
Monocytes Relative: 7 %
Neutro Abs: 5.3 K/uL (ref 1.7–7.7)
Neutrophils Relative %: 82 %
Platelet Count: 150 K/uL (ref 150–400)
RBC: 2.9 MIL/uL — ABNORMAL LOW (ref 3.87–5.11)
RDW: 16.3 % — ABNORMAL HIGH (ref 11.5–15.5)
WBC Count: 6.4 K/uL (ref 4.0–10.5)
nRBC: 0 % (ref 0.0–0.2)

## 2024-07-16 LAB — RETIC PANEL
Immature Retic Fract: 13.1 % (ref 2.3–15.9)
RBC.: 2.9 MIL/uL — ABNORMAL LOW (ref 3.87–5.11)
Retic Count, Absolute: 62.4 K/uL (ref 19.0–186.0)
Retic Ct Pct: 2.2 % (ref 0.4–3.1)
Reticulocyte Hemoglobin: 26 pg — ABNORMAL LOW (ref >27.9)

## 2024-07-16 LAB — IRON AND TIBC
Iron: 27 ug/dL — ABNORMAL LOW (ref 28–170)
Saturation Ratios: 9 % — ABNORMAL LOW (ref 10.4–31.8)
TIBC: 288 ug/dL (ref 250–450)
UIBC: 261 ug/dL

## 2024-07-16 LAB — FERRITIN: Ferritin: 158 ng/mL (ref 11–307)

## 2024-07-18 ENCOUNTER — Inpatient Hospital Stay: Admitting: Oncology

## 2024-07-18 ENCOUNTER — Inpatient Hospital Stay

## 2024-07-18 ENCOUNTER — Encounter: Payer: Self-pay | Admitting: Oncology

## 2024-07-18 VITALS — BP 121/85 | HR 76 | Temp 96.3°F | Resp 18 | Wt 145.9 lb

## 2024-07-18 DIAGNOSIS — N6099 Unspecified benign mammary dysplasia of unspecified breast: Secondary | ICD-10-CM

## 2024-07-18 DIAGNOSIS — I130 Hypertensive heart and chronic kidney disease with heart failure and stage 1 through stage 4 chronic kidney disease, or unspecified chronic kidney disease: Secondary | ICD-10-CM | POA: Diagnosis not present

## 2024-07-18 DIAGNOSIS — Z7901 Long term (current) use of anticoagulants: Secondary | ICD-10-CM

## 2024-07-18 DIAGNOSIS — N184 Chronic kidney disease, stage 4 (severe): Secondary | ICD-10-CM

## 2024-07-18 DIAGNOSIS — C50919 Malignant neoplasm of unspecified site of unspecified female breast: Secondary | ICD-10-CM | POA: Diagnosis not present

## 2024-07-18 DIAGNOSIS — D631 Anemia in chronic kidney disease: Secondary | ICD-10-CM

## 2024-07-18 MED ORDER — EPOETIN ALFA-EPBX 40000 UNIT/ML IJ SOLN
40000.0000 [IU] | Freq: Once | INTRAMUSCULAR | Status: AC
  Administered 2024-07-18: 40000 [IU] via SUBCUTANEOUS
  Filled 2024-07-18: qty 1

## 2024-07-18 MED ORDER — EPOETIN ALFA-EPBX 20000 UNIT/ML IJ SOLN
20000.0000 [IU] | Freq: Once | INTRAMUSCULAR | Status: AC
  Administered 2024-07-18: 20000 [IU] via SUBCUTANEOUS
  Filled 2024-07-18: qty 1

## 2024-07-18 NOTE — Assessment & Plan Note (Signed)
 Patient is on aromatase inhibitor.

## 2024-07-18 NOTE — Assessment & Plan Note (Addendum)
 Labs are reviewed and discussed with patient. Lab Results  Component Value Date   HGB 8.5 (L) 07/16/2024   TIBC 288 07/16/2024   IRONPCTSAT 9 (L) 07/16/2024   FERRITIN 158 07/16/2024    Ferritin is <200, recommend IV venofer  x 2 Q3 weeks Retacrit  60,000 if hemoglobin is less than 10.

## 2024-07-18 NOTE — Progress Notes (Signed)
 Hematology/Oncology Progress note [phone] [phone]      Patient Care Team: Alla Amis, MD as PCP - General (Family Medicine) Babara Call, MD as Consulting Physician (Hematology and Oncology) Dannielle Arlean FALCON, RN (Inactive) as Oncology Nurse Navigator  ASSESSMENT & PLAN:   Cancer Staging  Invasive carcinoma of breast [facility]) Staging form: Breast, AJCC 8th Edition - Pathologic stage from 03/31/2022: Stage IA (pT1b, pN0, cM0, G1, ER+, PR+, HER2-) - Signed by Babara Call, MD on 03/31/2022   Invasive carcinoma of breast (HCC) Left breast cancer, stage IA, pT1b N0 ER/PR positive, HER2 negative. Oncotype Dx was not sent due to patient being a poor candidate for chemotherapy due to multiple medical problems.She is not interested in chemotherapy as well Labs are reviewed and discussed with patient. Continue Arimidex  1mg  daily  Patient declines DEXA. Recommend calcium  and vitamin D supplementation annual diagnostic mammogram-February 2025 results were reviewed with patient.   Anemia of chronic renal failure Labs are reviewed and discussed with patient. Lab Results  Component Value Date   HGB 8.5 (L) 07/16/2024   TIBC 288 07/16/2024   IRONPCTSAT 9 (L) 07/16/2024   FERRITIN 158 07/16/2024    Ferritin is <200, recommend IV venofer  x 2 Q3 weeks Retacrit  60,000 if hemoglobin is less than 10.   Atypical lobular hyperplasia [facility]) of breast Patient is on aromatase inhibitor.    Chronic anticoagulation Patient is on Coumadin  for cardiology etiology. History of GI bleeding due to anticoagulation   Orders Placed This Encounter  Procedures   CBC with Differential (Cancer Center Only)    Standing Status:   Future    Expected Date:   10/10/2024    Expiration Date:   01/08/2025   Ferritin    Standing Status:   Future    Expected Date:   10/10/2024    Expiration Date:   01/08/2025   Iron  and TIBC    Standing Status:   Future    Expected Date:   10/10/2024     Expiration Date:   01/08/2025   Retic Panel    Standing Status:   Future    Expected Date:   10/10/2024    Expiration Date:   01/08/2025   Hemoglobin and Hematocrit (Cancer Center Only)    Standing Status:   Standing    Number of Occurrences:   3    Expiration Date:   07/18/2025   Follow up  Venofer  x 2 Lab H&H in 3 weeks, 6 weeks, 9 weeks +/- retacrit   Follow up in 3 months, labs prior to MD +/- Venofer  +/- reatcrit  CBC, Iron , TIBC Ferritin, retic panel   All questions were answered. The patient knows to call the clinic with any problems, questions or concerns.  Call Babara, MD, PhD [facility] Hematology Oncology 07/18/2024    REASON FOR VISIT Follow up for breast cancer, anemia secondary to chronic kidney disease, chronic anticoagulation  HISTORY OF PRESENTING ILLNESS:  Patient presents for follow-up of breast cancer, anemia secondary to chronic kidney disease, chronic anticoagulation Oncology history summary listed as below. Oncology History  Invasive carcinoma of breast (HCC)  02/08/2022 Mammogram   Unilateral diagnostic mammogram /us  1. Suspicious mass left breast 10:30 o'clock. 2. Anterior to this suspicious mass on the true lateral view is an additional small lobular mass which is not visualized on ultrasound.     02/28/2022 Initial Diagnosis   Invasive carcinoma of breast (HCC)   03/14/2022 Surgery    patient is status post left breast  lumpectomy Pathology showed left mammary carcinoma, no special type, 2 sentinel lymph nodes were excised and both were negative.  Margins are negative for invasive carcinoma. pT1b pN0.  Previous biopsy showed ER positive, PR positive HER2 negative   03/31/2022 Cancer Staging   Staging form: Breast, AJCC 8th Edition - Pathologic stage from 03/31/2022: Stage IA (pT1b, pN0, cM0, G1, ER+, PR+, HER2-) - Signed by Babara Call, MD on 03/31/2022 Stage prefix: Initial diagnosis Multigene prognostic tests performed: None Histologic grading system: 3  grade system   04/25/2022 - 05/24/2022 Radiation Therapy   S/p adjuvant breast radiation    Genetic Testing   Patient declined genetic testing   05/26/2022 -  Anti-estrogen oral therapy   Started Arimidex      INTERVAL HISTORY 73 y.o. female  presents for follow-up for evaluation of breast cancer, anemia due to chronic kidney disease, chronic anticoagulation. Patient currently is on Arimidex  1mg  daily overall tolerates well.  Manageable side effects.   Patient reports feeling well.  Recent hospitalization at [facility] due to melena and Hgb of 6.8. Underwent enteroscopy with VCE placement, which showed a single distal small bowel nonbleeding angioectasia. Given risk/benefit of double balloon enteroscopy due to high cardiac risk, she opted for medical management of anemia. Started on octreotide 100mcg BID SQ. Received 60K unit EPO for contribution of CKD to her anemia  + fatigue stable    Review of Systems  Constitutional:  Positive for malaise/fatigue. Negative for chills, fever and weight loss.  HENT:  Negative for sore throat.   Eyes:  Negative for redness.  Respiratory:  Negative for cough, shortness of breath and wheezing.   Cardiovascular:  Negative for chest pain, palpitations and leg swelling.  Gastrointestinal:  Negative for abdominal pain, blood in stool, nausea and vomiting.  Genitourinary:  Negative for dysuria.  Musculoskeletal:  Negative for myalgias.  Skin:  Negative for rash.  Neurological:  Negative for dizziness, tingling and tremors.  Endo/Heme/Allergies:  Does not bruise/bleed easily.  Psychiatric/Behavioral:  Negative for hallucinations.     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 Jude mechanical valve 09/19/2003   Arthritis    Breast cancer, left (HCC) 02/22/2022   a.) CNB 02/22/2022 (+) for invasive mammary carcinoma; stage IA (cT1b, cN0, cM0, G1, ER+, PR+, HER2-).   Cerebrovascular accident (CVA) due to embolism of other precerebral artery (HCC)    CKD (chronic kidney disease) stage 4, GFR 15-29 ml/min (HCC)    Coronary artery disease    a.) LHC 05/07/2015: 50% RPDA, 50% inf septal, 50% mLCx, 50% pLAD, 40% mRCA; intervention deferred opting for medical mgmt.   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 b.) s/p DCCV 2016 and TEE with cardioversion 09/2018. 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   a.) 28 mm Hemashield like graft    SURGICAL HISTORY: Past Surgical History:  Procedure Laterality Date   ABDOMINAL HYSTERECTOMY     AORTIC VALVE REPLACEMENT N/A 09/19/2003   BREAST BIOPSY Right 01/29/2021   US  Bx, 6:00 vision clip --> sclerosing intraductal papilloma; (-) for atypia and malignancy   BREAST BIOPSY Right 01/29/2021   US  Bx, 8:00 Ribbon clip --> usual ductal hyperplasia; (-) for atypia and malignancy   BREAST  LUMPECTOMY Left 03/15/2022   CARDIAC CATHETERIZATION N/A 05/07/2015   Procedure: Left Heart Cath;  Surgeon: Denyse DELENA Bathe, MD;  Location: [facility] INVASIVE CV LAB;  Service: Cardiovascular;  Laterality: N/A;   COLONOSCOPY Left 02/20/2019   Procedure: COLONOSCOPY;  Surgeon: Janalyn Keene NOVAK, MD;  Location: [facility] ENDOSCOPY;  Service: Endoscopy;  Laterality: Left;   COLONOSCOPY N/A 02/21/2019   Procedure: COLONOSCOPY;  Surgeon: Janalyn Keene NOVAK, MD;  Location: [facility] ENDOSCOPY;  Service: Endoscopy;  Laterality: N/A;   ELECTROPHYSIOLOGIC STUDY N/A 05/05/2015   Procedure: Cardioversion;  Surgeon: Denyse DELENA Bathe, MD;  Location: [facility] ORS;  Service: Cardiovascular;  Laterality: N/A;   ENTEROSCOPY N/A 02/21/2019   Procedure: ENTEROSCOPY;  Surgeon: Janalyn Keene NOVAK, MD;  Location: [facility] ENDOSCOPY;  Service: Endoscopy;  Laterality: N/A;   ESOPHAGOGASTRODUODENOSCOPY Left 02/19/2019   Procedure: ESOPHAGOGASTRODUODENOSCOPY (EGD);  Surgeon: Janalyn Keene NOVAK, MD;  Location: [facility] ENDOSCOPY;  Service: Endoscopy;  Laterality: Left;   EXCISION OF BREAST BIOPSY Right 03/01/2021   Procedure: EXCISION OF BREAST BIOPSY w/ radiofrequency tag;  Surgeon: Rodolph Romano, MD;  Location: [facility] ORS;  Service: General;  Laterality: Right;   PARTIAL MASTECTOMY WITH NEEDLE LOCALIZATION AND AXILLARY SENTINEL LYMPH NODE BX Left 03/14/2022   Procedure: PARTIAL MASTECTOMY WITH Radio Frequency tag AND AXILLARY SENTINEL LYMPH NODE BX;  Surgeon: Rodolph Romano, MD;  Location: [facility] ORS;  Service: General;  Laterality: Left;   REPAIR OF ACUTE ASCENDING THORACIC AORTIC DISSECTION N/A 06/08/2003   Procedure: ASCENDING AORTIC DISSECTION REPAIR AND RESUSPENSION OF AORTIC VALVE (28 mm Hemashield like graft); Location: [facility]; Surgeon: Korene Dandy, MD   REPLACEMENT TOTAL KNEE BILATERAL     TEE WITH CARDIOVERSION  09/26/2018    SOCIAL HISTORY: Social History   Socioeconomic History   Marital status: Legally  Separated    Spouse name: Not on file   Number of children: Not on file   Years of education: Not on file   Highest education level: Not on file  Occupational History   Not on file  Tobacco Use   Smoking status: Former    Current packs/day: 0.00    Average packs/day: 0.5 packs/day for 20.0 years (10.0 ttl pk-yrs)    Types: Cigarettes    Start date: 22    Quit date: 2004    Years since quitting: 21.6   Smokeless tobacco: Never  Vaping Use   Vaping status: Never Used  Substance and Sexual Activity   Alcohol use: Yes    Alcohol/week: 0.0 standard drinks of  alcohol    Comment: occas   Drug use: No   Sexual activity: Not on file  Other Topics Concern   Not on file  Social History Narrative   Not on file   Social Drivers of Health   Financial Resource Strain: Medium Risk (07/03/2024)   Received from [facility]   Overall Financial Resource Strain (CARDIA)    Difficulty of Paying Living Expenses: Somewhat hard  Food Insecurity: No Food Insecurity (07/03/2024)   Received from [facility]   Hunger Vital Sign    Within the past 12 months, you worried that your food would run out before you got the money to buy more.: Never true    Within the past 12 months, the food you bought just didn't last and you didn't have money to get more.: Never true  Transportation Needs: No Transportation Needs (07/03/2024)   Received from [facility] - Transportation    In the past 12 months, has lack of transportation kept you from medical appointments or from getting medications?: No    Lack of Transportation (Non-Medical): No  Physical Activity: Not on file  Stress: Not on file  Social Connections: Not on file  Intimate Partner Violence: Not on file    FAMILY HISTORY: Family History  Problem Relation Age of Onset   Hypertension Mother    Colon cancer Mother    Prostate cancer Father    Kidney cancer Brother    Prostate  cancer Brother    Prostate cancer Brother    Breast cancer Neg Hx     ALLERGIES:  is allergic to atorvastatin .  MEDICATIONS:  Current Outpatient Medications  Medication Sig Dispense Refill   allopurinol (ZYLOPRIM) 100 MG tablet Take 100 mg by mouth at bedtime.     amiodarone  (PACERONE ) 200 MG tablet Take 200 mg by mouth daily.     anastrozole  (ARIMIDEX ) 1 MG tablet TAKE 1 TABLET(1 MG) BY MOUTH DAILY 90 tablet 1   diphenhydramine -acetaminophen  (TYLENOL  PM) 25-500 MG TABS tablet Take 1 tablet by mouth at bedtime as needed.     escitalopram (LEXAPRO) 10 MG tablet Take 10 mg by mouth daily.     folic acid  (FOLVITE ) 1 MG tablet TAKE 1 TABLET BY MOUTH DAILY 30 tablet 11   hydrALAZINE  (APRESOLINE ) 25 MG tablet Take 1 tablet by mouth 3 (three) times daily. (Patient taking differently: Take 1 tablet by mouth 3 (three) times daily. 1/2 tab 3 times a day)     isosorbide dinitrate (ISORDIL) 10 MG tablet Take 10 mg by mouth 3 (three) times daily.     methimazole  (TAPAZOLE ) 5 MG tablet Take 5 mg by mouth daily.     metoprolol  succinate (TOPROL -XL) 25 MG 24 hr tablet Take 12.5 mg by mouth daily.     Octreotide Acetate 100 MCG/ML SOSY Inject 100 mcg into the skin.     pantoprazole  (PROTONIX ) 20 MG tablet Take 1 tablet (20 mg total) by mouth daily. 30 tablet 2   rosuvastatin  (CRESTOR ) 5 MG tablet Take 5 mg by mouth at bedtime.  2   sodium bicarbonate  650 MG tablet Take 650 mg by mouth 2 (two) times daily.     torsemide (DEMADEX) 20 MG tablet Take 60 mg by mouth daily.     Vitamin D, Ergocalciferol, (DRISDOL) 1.25 MG (50000 UNIT) CAPS capsule Take 50,000 Units by mouth once a week.     warfarin (COUMADIN ) 4 MG tablet Take 4 mg by  mouth daily.     amLODipine  (NORVASC ) 10 MG tablet Take 10 mg by mouth daily.  (Patient not taking: Reported on 07/18/2024)     amoxicillin (AMOXIL) 500 MG tablet Take 2,000 mg by mouth See admin instructions. Take 2000 mg by mouth one hour prior to dental procedures (Patient not  taking: Reported on 07/18/2024)     No current facility-administered medications for this visit.     PHYSICAL EXAMINATION: ECOG PERFORMANCE STATUS: 1 - Symptomatic but completely ambulatory Vitals:   07/18/24 1050 07/18/24 1102  BP: 131/88 121/85  Pulse: 76   Resp: 18   Temp: (!) 96.3 F (35.7 C)   SpO2: 99%    Filed Weights   07/18/24 1050  Weight: 145 lb 14.4 oz (66.2 kg)    Physical Exam Constitutional:      General: She is not in acute distress.    Appearance: She is not diaphoretic.  HENT:     Head: Normocephalic and atraumatic.  Eyes:     General: No scleral icterus.       Left eye: No discharge.  Neck:     Vascular: No JVD.  Cardiovascular:     Rate and Rhythm: Normal rate. Rhythm irregular.     Heart sounds: Murmur heard.  Pulmonary:     Effort: Pulmonary effort is normal. No respiratory distress.     Breath sounds: No wheezing.  Abdominal:     General: Bowel sounds are normal. There is no distension.     Palpations: Abdomen is soft.     Tenderness: There is no abdominal tenderness.  Musculoskeletal:        General: No tenderness. Normal range of motion.     Cervical back: Normal range of motion and neck supple.     Right lower leg: Edema present.     Left lower leg: Edema present.  Lymphadenopathy:     Cervical: No cervical adenopathy.  Skin:    General: Skin is warm and dry.     Findings: No erythema or rash.  Neurological:     Mental Status: She is alert and oriented to person, place, and time. Mental status is at baseline.     Motor: No abnormal muscle tone.  Psychiatric:        Mood and Affect: Mood and affect normal.     LABORATORY DATA:  I have reviewed the data as listed    Latest Ref Rng & Units 07/16/2024   10:41 AM 06/06/2024   10:55 AM 05/16/2024    1:13 PM  CBC  WBC 4.0 - 10.5 K/uL 6.4     Hemoglobin 12.0 - 15.0 g/dL 8.5  7.9  9.0   Hematocrit 36.0 - 46.0 % 27.9  26.5  30.5   Platelets 150 - 400 K/uL 150         Latest Ref Rng &  Units 02/06/2024   10:51 AM 04/27/2023    1:37 PM 09/28/2022    1:49 PM  CMP  Glucose 70 - 99 mg/dL  90  92   BUN 8 - 23 mg/dL  66  32   Creatinine 9.55 - 1.00 mg/dL  7.47  8.13   Sodium 864 - 145 mmol/L  139  142   Potassium 3.5 - 5.1 mmol/L  5.9  3.7   Chloride 98 - 111 mmol/L  110  110   CO2 22 - 32 mmol/L  22  25   Calcium  8.9 - 10.3 mg/dL  9.7  9.7  Total Protein 6.5 - 8.1 g/dL 6.6  7.1  7.0   Total Bilirubin 0.0 - 1.2 mg/dL 0.5  0.5  0.5   Alkaline Phos 38 - 126 U/L 125  106  126   AST 15 - 41 U/L 17  30  16    ALT 0 - 44 U/L 19  31  18       Iron /TIBC/Ferritin/ %Sat    Component Value Date/Time   IRON  27 (L) 07/16/2024 1041   TIBC 288 07/16/2024 1041   FERRITIN 158 07/16/2024 1041   IRONPCTSAT 9 (L) 07/16/2024 1041                                                                                                                                                        RADIOGRAPHIC STUDIES: I have personally reviewed the radiological images as listed and agreed with the findings in the report. No results found.

## 2024-07-18 NOTE — Assessment & Plan Note (Addendum)
 Left breast cancer, stage IA, pT1b N0 ER/PR positive, HER2 negative. Oncotype Dx was not sent due to patient being a poor candidate for chemotherapy due to multiple medical problems.She is not interested in chemotherapy as well Labs are reviewed and discussed with patient. Continue Arimidex  1mg  daily  Patient declines DEXA. Recommend calcium  and vitamin D supplementation annual diagnostic mammogram-February 2025 results were reviewed with patient.

## 2024-07-18 NOTE — Assessment & Plan Note (Signed)
 Patient is on Coumadin  for cardiology etiology. History of GI bleeding due to anticoagulation

## 2024-07-23 ENCOUNTER — Inpatient Hospital Stay

## 2024-07-23 VITALS — BP 108/60 | HR 63 | Temp 99.0°F | Resp 18

## 2024-07-23 DIAGNOSIS — N184 Chronic kidney disease, stage 4 (severe): Secondary | ICD-10-CM

## 2024-07-23 DIAGNOSIS — I130 Hypertensive heart and chronic kidney disease with heart failure and stage 1 through stage 4 chronic kidney disease, or unspecified chronic kidney disease: Secondary | ICD-10-CM | POA: Diagnosis not present

## 2024-07-23 DIAGNOSIS — D631 Anemia in chronic kidney disease: Secondary | ICD-10-CM

## 2024-07-23 MED ORDER — IRON SUCROSE 20 MG/ML IV SOLN
200.0000 mg | Freq: Once | INTRAVENOUS | Status: AC
  Administered 2024-07-23 (×2): 200 mg via INTRAVENOUS
  Filled 2024-07-23: qty 10

## 2024-07-30 ENCOUNTER — Inpatient Hospital Stay

## 2024-07-30 VITALS — BP 116/74 | HR 72 | Temp 97.2°F | Resp 18

## 2024-07-30 DIAGNOSIS — D631 Anemia in chronic kidney disease: Secondary | ICD-10-CM

## 2024-07-30 DIAGNOSIS — I130 Hypertensive heart and chronic kidney disease with heart failure and stage 1 through stage 4 chronic kidney disease, or unspecified chronic kidney disease: Secondary | ICD-10-CM | POA: Diagnosis not present

## 2024-07-30 MED ORDER — IRON SUCROSE 20 MG/ML IV SOLN
200.0000 mg | Freq: Once | INTRAVENOUS | Status: AC
  Administered 2024-07-30: 200 mg via INTRAVENOUS
  Filled 2024-07-30: qty 10

## 2024-07-30 NOTE — Patient Instructions (Signed)

## 2024-08-08 ENCOUNTER — Inpatient Hospital Stay

## 2024-08-08 VITALS — BP 118/69

## 2024-08-08 DIAGNOSIS — N184 Chronic kidney disease, stage 4 (severe): Secondary | ICD-10-CM

## 2024-08-08 DIAGNOSIS — I130 Hypertensive heart and chronic kidney disease with heart failure and stage 1 through stage 4 chronic kidney disease, or unspecified chronic kidney disease: Secondary | ICD-10-CM | POA: Diagnosis not present

## 2024-08-08 LAB — HEMOGLOBIN AND HEMATOCRIT (CANCER CENTER ONLY)
HCT: 25.5 % — ABNORMAL LOW (ref 36.0–46.0)
Hemoglobin: 7.6 g/dL — ABNORMAL LOW (ref 12.0–15.0)

## 2024-08-08 MED ORDER — EPOETIN ALFA-EPBX 20000 UNIT/ML IJ SOLN
20000.0000 [IU] | Freq: Once | INTRAMUSCULAR | Status: AC
  Administered 2024-08-08: 20000 [IU] via SUBCUTANEOUS
  Filled 2024-08-08: qty 1

## 2024-08-08 MED ORDER — EPOETIN ALFA-EPBX 40000 UNIT/ML IJ SOLN
40000.0000 [IU] | Freq: Once | INTRAMUSCULAR | Status: AC
  Administered 2024-08-08: 40000 [IU] via SUBCUTANEOUS
  Filled 2024-08-08: qty 1

## 2024-08-29 ENCOUNTER — Other Ambulatory Visit: Payer: Self-pay

## 2024-08-29 ENCOUNTER — Telehealth: Payer: Self-pay

## 2024-08-29 ENCOUNTER — Inpatient Hospital Stay: Attending: Oncology

## 2024-08-29 ENCOUNTER — Inpatient Hospital Stay

## 2024-08-29 VITALS — BP 128/77

## 2024-08-29 DIAGNOSIS — I130 Hypertensive heart and chronic kidney disease with heart failure and stage 1 through stage 4 chronic kidney disease, or unspecified chronic kidney disease: Secondary | ICD-10-CM | POA: Insufficient documentation

## 2024-08-29 DIAGNOSIS — N184 Chronic kidney disease, stage 4 (severe): Secondary | ICD-10-CM

## 2024-08-29 DIAGNOSIS — D631 Anemia in chronic kidney disease: Secondary | ICD-10-CM | POA: Insufficient documentation

## 2024-08-29 LAB — HEMOGLOBIN AND HEMATOCRIT (CANCER CENTER ONLY)
HCT: 26.8 % — ABNORMAL LOW (ref 36.0–46.0)
Hemoglobin: 7.9 g/dL — ABNORMAL LOW (ref 12.0–15.0)

## 2024-08-29 MED ORDER — EPOETIN ALFA-EPBX 40000 UNIT/ML IJ SOLN
40000.0000 [IU] | Freq: Once | INTRAMUSCULAR | Status: AC
  Administered 2024-08-29: 40000 [IU] via SUBCUTANEOUS
  Filled 2024-08-29: qty 1

## 2024-08-29 MED ORDER — EPOETIN ALFA-EPBX 20000 UNIT/ML IJ SOLN
20000.0000 [IU] | Freq: Once | INTRAMUSCULAR | Status: AC
  Administered 2024-08-29: 20000 [IU] via SUBCUTANEOUS
  Filled 2024-08-29: qty 1

## 2024-08-29 NOTE — Telephone Encounter (Signed)
 Patient her for lab/Retacrit .  Recent admission at [facility] as advised by nephrologist with discharge 4 days ago.  During admission she received 3 units blood transfusions.  Discharge hgb was 8.3 (4 days ago) and hgb today is 7.9  Update discussed with Dr. Babara and she would like to modify treatment plan from lab/Retacrit  every 3 weeks to lab/Retacrit  every 2 weeks until next follow us  MD visit.   Changes made and appt updates given to pt.

## 2024-09-12 ENCOUNTER — Inpatient Hospital Stay: Attending: Oncology

## 2024-09-12 ENCOUNTER — Inpatient Hospital Stay

## 2024-09-12 VITALS — BP 142/84

## 2024-09-12 DIAGNOSIS — D631 Anemia in chronic kidney disease: Secondary | ICD-10-CM | POA: Diagnosis present

## 2024-09-12 DIAGNOSIS — I130 Hypertensive heart and chronic kidney disease with heart failure and stage 1 through stage 4 chronic kidney disease, or unspecified chronic kidney disease: Secondary | ICD-10-CM | POA: Insufficient documentation

## 2024-09-12 DIAGNOSIS — N184 Chronic kidney disease, stage 4 (severe): Secondary | ICD-10-CM | POA: Diagnosis present

## 2024-09-12 LAB — HEMOGLOBIN AND HEMATOCRIT (CANCER CENTER ONLY)
HCT: 29.5 % — ABNORMAL LOW (ref 36.0–46.0)
Hemoglobin: 8.8 g/dL — ABNORMAL LOW (ref 12.0–15.0)

## 2024-09-12 LAB — SAMPLE TO BLOOD BANK

## 2024-09-12 MED ORDER — EPOETIN ALFA-EPBX 20000 UNIT/ML IJ SOLN
20000.0000 [IU] | Freq: Once | INTRAMUSCULAR | Status: AC
  Administered 2024-09-12: 20000 [IU] via SUBCUTANEOUS
  Filled 2024-09-12: qty 1

## 2024-09-12 MED ORDER — EPOETIN ALFA-EPBX 40000 UNIT/ML IJ SOLN
40000.0000 [IU] | Freq: Once | INTRAMUSCULAR | Status: AC
  Administered 2024-09-12: 40000 [IU] via SUBCUTANEOUS
  Filled 2024-09-12: qty 1

## 2024-09-19 ENCOUNTER — Ambulatory Visit

## 2024-09-19 ENCOUNTER — Other Ambulatory Visit

## 2024-09-25 ENCOUNTER — Other Ambulatory Visit: Payer: Self-pay | Admitting: *Deleted

## 2024-09-25 DIAGNOSIS — D631 Anemia in chronic kidney disease: Secondary | ICD-10-CM

## 2024-09-26 ENCOUNTER — Inpatient Hospital Stay

## 2024-09-26 VITALS — BP 127/76 | HR 82

## 2024-09-26 DIAGNOSIS — D631 Anemia in chronic kidney disease: Secondary | ICD-10-CM

## 2024-09-26 DIAGNOSIS — I130 Hypertensive heart and chronic kidney disease with heart failure and stage 1 through stage 4 chronic kidney disease, or unspecified chronic kidney disease: Secondary | ICD-10-CM | POA: Diagnosis not present

## 2024-09-26 LAB — SAMPLE TO BLOOD BANK

## 2024-09-26 LAB — HEMOGLOBIN AND HEMATOCRIT (CANCER CENTER ONLY)
HCT: 28.5 % — ABNORMAL LOW (ref 36.0–46.0)
Hemoglobin: 8.4 g/dL — ABNORMAL LOW (ref 12.0–15.0)

## 2024-09-26 MED ORDER — EPOETIN ALFA-EPBX 20000 UNIT/ML IJ SOLN
20000.0000 [IU] | Freq: Once | INTRAMUSCULAR | Status: AC
  Administered 2024-09-26: 20000 [IU] via SUBCUTANEOUS
  Filled 2024-09-26: qty 1

## 2024-09-26 MED ORDER — EPOETIN ALFA-EPBX 40000 UNIT/ML IJ SOLN
40000.0000 [IU] | Freq: Once | INTRAMUSCULAR | Status: AC
  Administered 2024-09-26: 40000 [IU] via SUBCUTANEOUS
  Filled 2024-09-26: qty 1

## 2024-10-08 ENCOUNTER — Inpatient Hospital Stay

## 2024-10-08 DIAGNOSIS — I130 Hypertensive heart and chronic kidney disease with heart failure and stage 1 through stage 4 chronic kidney disease, or unspecified chronic kidney disease: Secondary | ICD-10-CM | POA: Diagnosis not present

## 2024-10-08 DIAGNOSIS — C50919 Malignant neoplasm of unspecified site of unspecified female breast: Secondary | ICD-10-CM

## 2024-10-08 DIAGNOSIS — D631 Anemia in chronic kidney disease: Secondary | ICD-10-CM

## 2024-10-08 LAB — CBC WITH DIFFERENTIAL (CANCER CENTER ONLY)
Abs Immature Granulocytes: 0.05 K/uL (ref 0.00–0.07)
Basophils Absolute: 0.1 K/uL (ref 0.0–0.1)
Basophils Relative: 1 %
Eosinophils Absolute: 0.1 K/uL (ref 0.0–0.5)
Eosinophils Relative: 2 %
HCT: 29.5 % — ABNORMAL LOW (ref 36.0–46.0)
Hemoglobin: 8.2 g/dL — ABNORMAL LOW (ref 12.0–15.0)
Immature Granulocytes: 1 %
Lymphocytes Relative: 10 %
Lymphs Abs: 0.5 K/uL — ABNORMAL LOW (ref 0.7–4.0)
MCH: 27.2 pg (ref 26.0–34.0)
MCHC: 27.8 g/dL — ABNORMAL LOW (ref 30.0–36.0)
MCV: 97.7 fL (ref 80.0–100.0)
Monocytes Absolute: 0.4 K/uL (ref 0.1–1.0)
Monocytes Relative: 7 %
Neutro Abs: 4.3 K/uL (ref 1.7–7.7)
Neutrophils Relative %: 79 %
Platelet Count: 154 K/uL (ref 150–400)
RBC: 3.02 MIL/uL — ABNORMAL LOW (ref 3.87–5.11)
RDW: 18.8 % — ABNORMAL HIGH (ref 11.5–15.5)
WBC Count: 5.4 K/uL (ref 4.0–10.5)
nRBC: 0 % (ref 0.0–0.2)

## 2024-10-08 LAB — RETIC PANEL
Immature Retic Fract: 19.6 % — ABNORMAL HIGH (ref 2.3–15.9)
RBC.: 3.04 MIL/uL — ABNORMAL LOW (ref 3.87–5.11)
Retic Count, Absolute: 79.6 K/uL (ref 19.0–186.0)
Retic Ct Pct: 2.6 % (ref 0.4–3.1)
Reticulocyte Hemoglobin: 26 pg — ABNORMAL LOW (ref >27.9)

## 2024-10-08 LAB — SAMPLE TO BLOOD BANK

## 2024-10-08 LAB — FERRITIN: Ferritin: 83 ng/mL (ref 11–307)

## 2024-10-08 LAB — IRON AND TIBC
Iron: 32 ug/dL (ref 28–170)
Saturation Ratios: 10 % — ABNORMAL LOW (ref 10.4–31.8)
TIBC: 319 ug/dL (ref 250–450)
UIBC: 287 ug/dL

## 2024-10-10 ENCOUNTER — Inpatient Hospital Stay

## 2024-10-10 ENCOUNTER — Inpatient Hospital Stay (HOSPITAL_BASED_OUTPATIENT_CLINIC_OR_DEPARTMENT_OTHER): Admitting: Oncology

## 2024-10-10 ENCOUNTER — Encounter: Payer: Self-pay | Admitting: Oncology

## 2024-10-10 VITALS — BP 142/96 | HR 75 | Temp 96.7°F | Resp 18 | Wt 151.0 lb

## 2024-10-10 VITALS — BP 162/79

## 2024-10-10 DIAGNOSIS — D631 Anemia in chronic kidney disease: Secondary | ICD-10-CM

## 2024-10-10 DIAGNOSIS — Z7901 Long term (current) use of anticoagulants: Secondary | ICD-10-CM | POA: Diagnosis not present

## 2024-10-10 DIAGNOSIS — I130 Hypertensive heart and chronic kidney disease with heart failure and stage 1 through stage 4 chronic kidney disease, or unspecified chronic kidney disease: Secondary | ICD-10-CM | POA: Diagnosis not present

## 2024-10-10 DIAGNOSIS — N6099 Unspecified benign mammary dysplasia of unspecified breast: Secondary | ICD-10-CM | POA: Diagnosis not present

## 2024-10-10 DIAGNOSIS — N184 Chronic kidney disease, stage 4 (severe): Secondary | ICD-10-CM | POA: Diagnosis not present

## 2024-10-10 DIAGNOSIS — C50919 Malignant neoplasm of unspecified site of unspecified female breast: Secondary | ICD-10-CM | POA: Diagnosis not present

## 2024-10-10 MED ORDER — EPOETIN ALFA-EPBX 40000 UNIT/ML IJ SOLN
40000.0000 [IU] | Freq: Once | INTRAMUSCULAR | Status: DC
  Filled 2024-10-10: qty 1

## 2024-10-10 MED ORDER — IRON SUCROSE 20 MG/ML IV SOLN
200.0000 mg | Freq: Once | INTRAVENOUS | Status: AC
  Administered 2024-10-10: 200 mg via INTRAVENOUS
  Filled 2024-10-10: qty 10

## 2024-10-10 MED ORDER — EPOETIN ALFA-EPBX 20000 UNIT/ML IJ SOLN
20000.0000 [IU] | Freq: Once | INTRAMUSCULAR | Status: DC
  Filled 2024-10-10: qty 1

## 2024-10-10 NOTE — Progress Notes (Signed)
 Hematology/Oncology Progress note [phone] [phone]      Patient Care Team: Alla Amis, MD as PCP - General (Family Medicine) Babara Call, MD as Consulting Physician (Hematology and Oncology) Dannielle Arlean FALCON, RN (Inactive) as Oncology Nurse Navigator  ASSESSMENT & PLAN:   Cancer Staging  Invasive carcinoma of breast [facility]) Staging form: Breast, AJCC 8th Edition - Pathologic stage from 03/31/2022: Stage IA (pT1b, pN0, cM0, G1, ER+, PR+, HER2-) - Signed by Babara Call, MD on 03/31/2022   Invasive carcinoma of breast (HCC) Left breast cancer, stage IA, pT1b N0 ER/PR positive, HER2 negative. Oncotype Dx was not sent due to patient being a poor candidate for chemotherapy due to multiple medical problems.She is not interested in chemotherapy as well Labs are reviewed and discussed with patient. Continue Arimidex  1mg  daily  Patient declines DEXA. Recommend calcium  and vitamin D supplementation annual diagnostic mammogram-February 2025 results were reviewed with patient.   Anemia of chronic renal failure Labs are reviewed and discussed with patient. Lab Results  Component Value Date   HGB 8.2 (L) 10/08/2024   TIBC 319 10/08/2024   IRONPCTSAT 10 (L) 10/08/2024   FERRITIN 83 10/08/2024   Hb is trending down Ferritin is <200, recommend IV venofer  x 4 Q2-3 weeks Retacrit  60,000 if hemoglobin is less than 10.   Atypical lobular hyperplasia [facility]) of breast Patient is on aromatase inhibitor.    Chronic anticoagulation Patient is on Coumadin  for cardiology etiology. History of GI bleeding due to anticoagulation octreotide 100mcg BID SQ per [facility]   Orders Placed This Encounter  Procedures   CBC with Differential (Cancer Center Only)    Standing Status:   Future    Expected Date:   01/02/2025    Expiration Date:   04/02/2025   Iron  and TIBC    Standing Status:   Future    Expected Date:   01/02/2025    Expiration Date:   04/02/2025   Ferritin     Standing Status:   Future    Expected Date:   01/02/2025    Expiration Date:   04/02/2025   Retic Panel    Standing Status:   Future    Expected Date:   01/02/2025    Expiration Date:   04/02/2025   Hemoglobin and Hematocrit (Cancer Center Only)    Standing Status:   Standing    Number of Occurrences:   3    Expiration Date:   10/10/2025   Follow up  Venofer  weekly x 4  Retacrit  next week(patient's preference)  Lab H&H in 3 weeks, 6 weeks, 9 weeks +/- retacrit   Follow up in 12 weeks , labs prior to MD +/- reatcrit  CBC, Iron , TIBC Ferritin, retic panel    All questions were answered. The patient knows to call the clinic with any problems, questions or concerns.  Call Babara, MD, PhD [facility] Hematology Oncology 10/10/2024    REASON FOR VISIT Follow up for breast cancer, anemia secondary to chronic kidney disease, chronic anticoagulation  HISTORY OF PRESENTING ILLNESS:  Patient presents for follow-up of breast cancer, anemia secondary to chronic kidney disease, chronic anticoagulation Oncology history summary listed as below. Oncology History  Invasive carcinoma of breast (HCC)  02/08/2022 Mammogram   Unilateral diagnostic mammogram /us  1. Suspicious mass left breast 10:30 o'clock. 2. Anterior to this suspicious mass on the true lateral view is an additional small lobular mass which is not visualized on ultrasound.     02/28/2022 Initial Diagnosis   Invasive  carcinoma of breast (HCC)   03/14/2022 Surgery    patient is status post left breast lumpectomy Pathology showed left mammary carcinoma, no special type, 2 sentinel lymph nodes were excised and both were negative.  Margins are negative for invasive carcinoma. pT1b pN0.  Previous biopsy showed ER positive, PR positive HER2 negative   03/31/2022 Cancer Staging   Staging form: Breast, AJCC 8th Edition - Pathologic stage from 03/31/2022: Stage IA (pT1b, pN0, cM0, G1, ER+, PR+, HER2-) - Signed by Babara Call, MD on 03/31/2022 Stage  prefix: Initial diagnosis Multigene prognostic tests performed: None Histologic grading system: 3 grade system   04/25/2022 - 05/24/2022 Radiation Therapy   S/p adjuvant breast radiation    Genetic Testing   Patient declined genetic testing   05/26/2022 -  Anti-estrogen oral therapy   Started Arimidex      INTERVAL HISTORY 73 y.o. female  presents for follow-up for evaluation of breast cancer, anemia due to chronic kidney disease, chronic anticoagulation. Discussed the use of AI scribe software for clinical note transcription with the patient, who gave verbal consent to proceed.   Patient currently is on Arimidex  1mg  daily overall tolerates well.  Manageable side effects.   She has been experiencing shortness of breath and fluid retention over the past few days. Her hemoglobin levels have decreased slightly from 8.4 two weeks ago to 8.2 currently. She follows up with cardiology and nephrology.  She is on diuretics.   + fatigue stable    Review of Systems  Constitutional:  Positive for malaise/fatigue. Negative for chills, fever and weight loss.  HENT:  Negative for sore throat.   Eyes:  Negative for redness.  Respiratory:  Negative for cough, shortness of breath and wheezing.   Cardiovascular:  Negative for chest pain, palpitations and leg swelling.  Gastrointestinal:  Negative for abdominal pain, blood in stool, nausea and vomiting.  Genitourinary:  Negative for dysuria.  Musculoskeletal:  Negative for myalgias.  Skin:  Negative for rash.  Neurological:  Negative for dizziness, tingling and tremors.  Endo/Heme/Allergies:  Does not bruise/bleed easily.  Psychiatric/Behavioral:  Negative for hallucinations.     MEDICAL HISTORY:  Past Medical History:  Diagnosis Date   (HFpEF) heart failure with preserved ejection fraction (HCC)    Anemia in chronic kidney disease (CKD) 11/14/2017   Aneurysm of infrarenal abdominal aorta    a.) measured 2.3 x 2.8 cm on 11/15/2019   Aneurysm  of left common iliac artery    a.) measured 1.9 x 1.9 on 11/15/2019   Anxiety    Aortic atherosclerosis    Aortic valve regurgitation    a.) severe; s/p placement of [employer] Jude mechanical valve 09/19/2003   Arthritis    Breast cancer, left (HCC) 02/22/2022   a.) CNB 02/22/2022 (+) for invasive mammary carcinoma; stage IA (cT1b, cN0, cM0, G1, ER+, PR+, HER2-).   Cerebrovascular accident (CVA) due to embolism of other precerebral artery (HCC)    CKD (chronic kidney disease) stage 4, GFR 15-29 ml/min (HCC)    Coronary artery disease    a.) LHC 05/07/2015: 50% RPDA, 50% inf septal, 50% mLCx, 50% pLAD, 40% mRCA; intervention deferred opting for medical mgmt.   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 b.) s/p DCCV 2016 and TEE with cardioversion 09/2018. 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   a.) 28 mm Hemashield like graft    SURGICAL HISTORY: Past Surgical History:  Procedure Laterality Date   ABDOMINAL HYSTERECTOMY     AORTIC VALVE REPLACEMENT N/A 09/19/2003   BREAST BIOPSY Right 01/29/2021   US  Bx, 6:00 vision clip --> sclerosing intraductal papilloma; (-) for atypia and malignancy   BREAST BIOPSY Right 01/29/2021   US  Bx, 8:00 Ribbon clip --> usual ductal hyperplasia; (-) for atypia and  malignancy   BREAST LUMPECTOMY Left 03/15/2022   CARDIAC CATHETERIZATION N/A 05/07/2015   Procedure: Left Heart Cath;  Surgeon: Denyse DELENA Bathe, MD;  Location: [facility] INVASIVE CV LAB;  Service: Cardiovascular;  Laterality: N/A;   COLONOSCOPY Left 02/20/2019   Procedure: COLONOSCOPY;  Surgeon: Janalyn Keene NOVAK, MD;  Location: [facility] ENDOSCOPY;  Service: Endoscopy;  Laterality: Left;   COLONOSCOPY N/A 02/21/2019   Procedure: COLONOSCOPY;  Surgeon: Janalyn Keene NOVAK, MD;  Location: [facility] ENDOSCOPY;  Service: Endoscopy;  Laterality: N/A;   ELECTROPHYSIOLOGIC STUDY N/A 05/05/2015   Procedure: Cardioversion;  Surgeon: Denyse DELENA Bathe, MD;  Location: [facility] ORS;  Service: Cardiovascular;  Laterality: N/A;   ENTEROSCOPY N/A 02/21/2019   Procedure: ENTEROSCOPY;  Surgeon: Janalyn Keene NOVAK, MD;  Location: [facility] ENDOSCOPY;  Service: Endoscopy;  Laterality: N/A;   ESOPHAGOGASTRODUODENOSCOPY Left 02/19/2019   Procedure: ESOPHAGOGASTRODUODENOSCOPY (EGD);  Surgeon: Janalyn Keene NOVAK, MD;  Location: [facility] ENDOSCOPY;  Service: Endoscopy;  Laterality: Left;   EXCISION OF BREAST BIOPSY Right 03/01/2021   Procedure: EXCISION OF BREAST BIOPSY w/ radiofrequency tag;  Surgeon: Rodolph Romano, MD;  Location: [facility] ORS;  Service: General;  Laterality: Right;   PARTIAL MASTECTOMY WITH NEEDLE LOCALIZATION AND AXILLARY SENTINEL LYMPH NODE BX Left 03/14/2022   Procedure: PARTIAL MASTECTOMY WITH Radio Frequency tag AND AXILLARY SENTINEL LYMPH NODE BX;  Surgeon: Rodolph Romano, MD;  Location: [facility] ORS;  Service: General;  Laterality: Left;   REPAIR OF ACUTE ASCENDING THORACIC AORTIC DISSECTION N/A 06/08/2003   Procedure: ASCENDING AORTIC DISSECTION REPAIR AND RESUSPENSION OF AORTIC VALVE (28 mm Hemashield like graft); Location: [facility]; Surgeon: Korene Dandy, MD   REPLACEMENT TOTAL KNEE BILATERAL     TEE WITH CARDIOVERSION  09/26/2018    SOCIAL HISTORY: Social History   Socioeconomic History   Marital  status: Legally Separated    Spouse name: Not on file   Number of children: Not on file   Years of education: Not on file   Highest education level: Not on file  Occupational History   Not on file  Tobacco Use   Smoking status: Former    Current packs/day: 0.00    Average packs/day: 0.5 packs/day for 20.0 years (10.0 ttl pk-yrs)    Types: Cigarettes    Start date: 27    Quit date: 2004    Years since quitting: 21.8   Smokeless tobacco: Never  Vaping Use   Vaping status: Never Used  Substance and Sexual Activity  Alcohol use: Yes    Alcohol/week: 0.0 standard drinks of alcohol    Comment: occas   Drug use: No   Sexual activity: Not on file  Other Topics Concern   Not on file  Social History Narrative   Not on file   Social Drivers of Health   Financial Resource Strain: Medium Risk (07/03/2024)   Received from [facility]   Overall Financial Resource Strain (CARDIA)    Difficulty of Paying Living Expenses: Somewhat hard  Food Insecurity: No Food Insecurity (07/03/2024)   Received from [facility]   Hunger Vital Sign    Within the past 12 months, you worried that your food would run out before you got the money to buy more.: Never true    Within the past 12 months, the food you bought just didn't last and you didn't have money to get more.: Never true  Transportation Needs: No Transportation Needs (08/21/2024)   Received from [facility] - Transportation    In the past 12 months, has lack of transportation kept you from medical appointments or from getting medications?: No    Lack of Transportation (Non-Medical): No  Physical Activity: Not on file  Stress: Not on file  Social Connections: Not on file  Intimate Partner Violence: Not on file    FAMILY HISTORY: Family History  Problem Relation Age of Onset   Hypertension Mother    Colon cancer Mother    Prostate cancer Father    Kidney cancer Brother     Prostate cancer Brother    Prostate cancer Brother    Breast cancer Neg Hx     ALLERGIES:  is allergic to atorvastatin .  MEDICATIONS:  Current Outpatient Medications  Medication Sig Dispense Refill   allopurinol (ZYLOPRIM) 100 MG tablet Take 100 mg by mouth at bedtime.     amiodarone  (PACERONE ) 200 MG tablet Take 200 mg by mouth daily.     anastrozole  (ARIMIDEX ) 1 MG tablet TAKE 1 TABLET(1 MG) BY MOUTH DAILY 90 tablet 1   diphenhydramine -acetaminophen  (TYLENOL  PM) 25-500 MG TABS tablet Take 1 tablet by mouth at bedtime as needed.     escitalopram (LEXAPRO) 10 MG tablet Take 10 mg by mouth daily.     folic acid  (FOLVITE ) 1 MG tablet TAKE 1 TABLET BY MOUTH DAILY 30 tablet 11   hydrALAZINE  (APRESOLINE ) 25 MG tablet Take 1 tablet by mouth 3 (three) times daily. (Patient taking differently: Take 1 tablet by mouth 3 (three) times daily. 1/2 tab 3 times a day)     methimazole  (TAPAZOLE ) 5 MG tablet Take 5 mg by mouth daily.     metoprolol  succinate (TOPROL -XL) 25 MG 24 hr tablet Take 12.5 mg by mouth daily.     Octreotide Acetate 100 MCG/ML SOSY Inject 100 mcg into the skin.     pantoprazole  (PROTONIX ) 20 MG tablet Take 1 tablet (20 mg total) by mouth daily. 30 tablet 2   rosuvastatin  (CRESTOR ) 5 MG tablet Take 5 mg by mouth at bedtime.  2   sodium bicarbonate  650 MG tablet Take 650 mg by mouth 2 (two) times daily.     torsemide (DEMADEX) 20 MG tablet Take 60 mg by mouth daily.     Vitamin D, Ergocalciferol, (DRISDOL) 1.25 MG (50000 UNIT) CAPS capsule Take 50,000 Units by mouth once a week.     warfarin (COUMADIN ) 4 MG tablet Take 4 mg by mouth daily.     amLODipine  (NORVASC )  10 MG tablet Take 10 mg by mouth daily.  (Patient not taking: Reported on 10/10/2024)     amoxicillin (AMOXIL) 500 MG tablet Take 2,000 mg by mouth See admin instructions. Take 2000 mg by mouth one hour prior to dental procedures (Patient not taking: Reported on 10/10/2024)     isosorbide dinitrate (ISORDIL) 10 MG tablet  Take 10 mg by mouth 3 (three) times daily. (Patient not taking: Reported on 10/10/2024)     No current facility-administered medications for this visit.     PHYSICAL EXAMINATION: ECOG PERFORMANCE STATUS: 1 - Symptomatic but completely ambulatory Vitals:   10/10/24 0957 10/10/24 1011  BP: (!) 158/124 (!) 142/96  Pulse: 75   Resp: 18   Temp: (!) 96.7 F (35.9 C)   SpO2: 96%    Filed Weights   10/10/24 0957  Weight: 151 lb (68.5 kg)    Physical Exam Constitutional:      General: She is not in acute distress.    Appearance: She is not diaphoretic.  HENT:     Head: Normocephalic and atraumatic.  Eyes:     General: No scleral icterus.       Left eye: No discharge.  Neck:     Vascular: No JVD.  Cardiovascular:     Rate and Rhythm: Normal rate. Rhythm irregular.     Heart sounds: Murmur heard.  Pulmonary:     Effort: Pulmonary effort is normal. No respiratory distress.     Breath sounds: No wheezing.  Abdominal:     General: Bowel sounds are normal. There is no distension.     Palpations: Abdomen is soft.     Tenderness: There is no abdominal tenderness.  Musculoskeletal:        General: No tenderness. Normal range of motion.     Cervical back: Normal range of motion and neck supple.     Right lower leg: Edema present.     Left lower leg: Edema present.  Lymphadenopathy:     Cervical: No cervical adenopathy.  Skin:    General: Skin is warm and dry.     Findings: No erythema or rash.  Neurological:     Mental Status: She is alert and oriented to person, place, and time. Mental status is at baseline.     Motor: No abnormal muscle tone.  Psychiatric:        Mood and Affect: Mood and affect normal.     LABORATORY DATA:  I have reviewed the data as listed    Latest Ref Rng & Units 10/08/2024   11:03 AM 09/26/2024   10:44 AM 09/12/2024   10:46 AM  CBC  WBC 4.0 - 10.5 K/uL 5.4     Hemoglobin 12.0 - 15.0 g/dL 8.2  8.4  8.8   Hematocrit 36.0 - 46.0 % 29.5  28.5   29.5   Platelets 150 - 400 K/uL 154         Latest Ref Rng & Units 02/06/2024   10:51 AM 04/27/2023    1:37 PM 09/28/2022    1:49 PM  CMP  Glucose 70 - 99 mg/dL  90  92   BUN 8 - 23 mg/dL  66  32   Creatinine 9.55 - 1.00 mg/dL  7.47  8.13   Sodium 864 - 145 mmol/L  139  142   Potassium 3.5 - 5.1 mmol/L  5.9  3.7   Chloride 98 - 111 mmol/L  110  110   CO2 22 - 32 mmol/L  22  25   Calcium  8.9 - 10.3 mg/dL  9.7  9.7   Total Protein 6.5 - 8.1 g/dL 6.6  7.1  7.0   Total Bilirubin 0.0 - 1.2 mg/dL 0.5  0.5  0.5   Alkaline Phos 38 - 126 U/L 125  106  126   AST 15 - 41 U/L 17  30  16    ALT 0 - 44 U/L 19  31  18       Iron /TIBC/Ferritin/ %Sat    Component Value Date/Time   IRON  32 10/08/2024 1103   TIBC 319 10/08/2024 1103   FERRITIN 83 10/08/2024 1103   IRONPCTSAT 10 (L) 10/08/2024 1103                                                                                                                                                        RADIOGRAPHIC STUDIES: I have personally reviewed the radiological images as listed and agreed with the findings in the report. No results found.

## 2024-10-10 NOTE — Assessment & Plan Note (Addendum)
 Labs are reviewed and discussed with patient. Lab Results  Component Value Date   HGB 8.2 (L) 10/08/2024   TIBC 319 10/08/2024   IRONPCTSAT 10 (L) 10/08/2024   FERRITIN 83 10/08/2024   Hb is trending down Ferritin is <200, recommend IV venofer  x 4 Q2-3 weeks Retacrit  60,000 if hemoglobin is less than 10.

## 2024-10-10 NOTE — Patient Instructions (Signed)

## 2024-10-10 NOTE — Assessment & Plan Note (Signed)
 Left breast cancer, stage IA, pT1b N0 ER/PR positive, HER2 negative. Oncotype Dx was not sent due to patient being a poor candidate for chemotherapy due to multiple medical problems.She is not interested in chemotherapy as well Labs are reviewed and discussed with patient. Continue Arimidex  1mg  daily  Patient declines DEXA. Recommend calcium  and vitamin D supplementation annual diagnostic mammogram-February 2025 results were reviewed with patient.

## 2024-10-10 NOTE — Progress Notes (Signed)
 Per patient no retacrit .

## 2024-10-10 NOTE — Assessment & Plan Note (Signed)
 Patient is on aromatase inhibitor.

## 2024-10-10 NOTE — Assessment & Plan Note (Addendum)
 Patient is on Coumadin  for cardiology etiology. History of GI bleeding due to anticoagulation octreotide 100mcg BID SQ per [facility]

## 2024-10-15 ENCOUNTER — Inpatient Hospital Stay: Attending: Oncology

## 2024-10-15 VITALS — BP 131/74

## 2024-10-15 DIAGNOSIS — D631 Anemia in chronic kidney disease: Secondary | ICD-10-CM | POA: Insufficient documentation

## 2024-10-15 DIAGNOSIS — I130 Hypertensive heart and chronic kidney disease with heart failure and stage 1 through stage 4 chronic kidney disease, or unspecified chronic kidney disease: Secondary | ICD-10-CM | POA: Diagnosis present

## 2024-10-15 DIAGNOSIS — N184 Chronic kidney disease, stage 4 (severe): Secondary | ICD-10-CM | POA: Diagnosis present

## 2024-10-15 MED ORDER — EPOETIN ALFA-EPBX 20000 UNIT/ML IJ SOLN
20000.0000 [IU] | Freq: Once | INTRAMUSCULAR | Status: AC
  Administered 2024-10-15: 20000 [IU] via SUBCUTANEOUS
  Filled 2024-10-15: qty 1

## 2024-10-15 MED ORDER — EPOETIN ALFA-EPBX 40000 UNIT/ML IJ SOLN
40000.0000 [IU] | Freq: Once | INTRAMUSCULAR | Status: AC
  Administered 2024-10-15: 40000 [IU] via SUBCUTANEOUS
  Filled 2024-10-15: qty 1

## 2024-10-15 NOTE — Progress Notes (Signed)
 Using labs from 10/08/24 hg 8.2

## 2024-10-17 ENCOUNTER — Inpatient Hospital Stay

## 2024-10-24 ENCOUNTER — Inpatient Hospital Stay

## 2024-10-24 VITALS — BP 144/98 | HR 76 | Temp 98.7°F | Resp 18

## 2024-10-24 DIAGNOSIS — I130 Hypertensive heart and chronic kidney disease with heart failure and stage 1 through stage 4 chronic kidney disease, or unspecified chronic kidney disease: Secondary | ICD-10-CM | POA: Diagnosis not present

## 2024-10-24 DIAGNOSIS — D631 Anemia in chronic kidney disease: Secondary | ICD-10-CM

## 2024-10-24 MED ORDER — IRON SUCROSE 20 MG/ML IV SOLN
200.0000 mg | Freq: Once | INTRAVENOUS | Status: AC
  Administered 2024-10-24: 200 mg via INTRAVENOUS
  Filled 2024-10-24: qty 10

## 2024-10-24 NOTE — Patient Instructions (Signed)

## 2024-10-29 ENCOUNTER — Inpatient Hospital Stay

## 2024-10-29 VITALS — BP 138/90 | HR 66 | Temp 98.6°F

## 2024-10-29 DIAGNOSIS — D631 Anemia in chronic kidney disease: Secondary | ICD-10-CM

## 2024-10-29 DIAGNOSIS — I130 Hypertensive heart and chronic kidney disease with heart failure and stage 1 through stage 4 chronic kidney disease, or unspecified chronic kidney disease: Secondary | ICD-10-CM | POA: Diagnosis not present

## 2024-10-29 MED ORDER — SODIUM CHLORIDE 0.9% FLUSH
10.0000 mL | INTRAVENOUS | Status: DC | PRN
  Administered 2024-10-29: 10 mL
  Filled 2024-10-29: qty 10

## 2024-10-29 MED ORDER — IRON SUCROSE 20 MG/ML IV SOLN
200.0000 mg | Freq: Once | INTRAVENOUS | Status: AC
  Administered 2024-10-29: 200 mg via INTRAVENOUS
  Filled 2024-10-29: qty 10

## 2024-11-05 ENCOUNTER — Inpatient Hospital Stay

## 2024-11-05 VITALS — BP 130/81

## 2024-11-05 DIAGNOSIS — D631 Anemia in chronic kidney disease: Secondary | ICD-10-CM

## 2024-11-05 DIAGNOSIS — I130 Hypertensive heart and chronic kidney disease with heart failure and stage 1 through stage 4 chronic kidney disease, or unspecified chronic kidney disease: Secondary | ICD-10-CM | POA: Diagnosis not present

## 2024-11-05 LAB — HEMOGLOBIN AND HEMATOCRIT (CANCER CENTER ONLY)
HCT: 32.9 % — ABNORMAL LOW (ref 36.0–46.0)
Hemoglobin: 9.8 g/dL — ABNORMAL LOW (ref 12.0–15.0)

## 2024-11-05 MED ORDER — EPOETIN ALFA-EPBX 20000 UNIT/ML IJ SOLN
20000.0000 [IU] | Freq: Once | INTRAMUSCULAR | Status: AC
  Administered 2024-11-05: 20000 [IU] via SUBCUTANEOUS
  Filled 2024-11-05: qty 1

## 2024-11-05 MED ORDER — EPOETIN ALFA-EPBX 40000 UNIT/ML IJ SOLN
40000.0000 [IU] | Freq: Once | INTRAMUSCULAR | Status: AC
  Administered 2024-11-05: 40000 [IU] via SUBCUTANEOUS
  Filled 2024-11-05: qty 1

## 2024-11-12 ENCOUNTER — Inpatient Hospital Stay: Attending: Oncology

## 2024-11-12 VITALS — BP 164/82 | HR 89 | Temp 97.1°F | Resp 17

## 2024-11-12 DIAGNOSIS — I130 Hypertensive heart and chronic kidney disease with heart failure and stage 1 through stage 4 chronic kidney disease, or unspecified chronic kidney disease: Secondary | ICD-10-CM | POA: Diagnosis present

## 2024-11-12 DIAGNOSIS — D631 Anemia in chronic kidney disease: Secondary | ICD-10-CM | POA: Insufficient documentation

## 2024-11-12 DIAGNOSIS — N184 Chronic kidney disease, stage 4 (severe): Secondary | ICD-10-CM | POA: Insufficient documentation

## 2024-11-12 MED ORDER — IRON SUCROSE 20 MG/ML IV SOLN
200.0000 mg | Freq: Once | INTRAVENOUS | Status: AC
  Administered 2024-11-12: 200 mg via INTRAVENOUS
  Filled 2024-11-12: qty 10

## 2024-11-12 MED ORDER — SODIUM CHLORIDE 0.9% FLUSH
10.0000 mL | INTRAVENOUS | Status: DC | PRN
  Administered 2024-11-12: 10 mL
  Filled 2024-11-12: qty 10

## 2024-11-19 ENCOUNTER — Encounter: Payer: Self-pay | Admitting: Oncology

## 2024-11-19 ENCOUNTER — Telehealth: Payer: Self-pay

## 2024-11-19 NOTE — Telephone Encounter (Signed)
 Patient requested a CBC to be drawn during anticoagulation check today.  Mitch calling with to provide the Hemoglobin result 7.2

## 2024-11-19 NOTE — Telephone Encounter (Signed)
 Per Dr. Babara, please schedule patient this week for: lab/retacrit  (cbc,HT, iron , ferr, retic)/ Blood day 2.   Called pt, no answer. Detailed message for patient to call back and schedule appts.

## 2024-11-20 ENCOUNTER — Other Ambulatory Visit: Payer: Self-pay

## 2024-11-20 ENCOUNTER — Encounter: Payer: Self-pay | Admitting: Oncology

## 2024-11-20 DIAGNOSIS — D631 Anemia in chronic kidney disease: Secondary | ICD-10-CM

## 2024-11-21 ENCOUNTER — Inpatient Hospital Stay

## 2024-11-21 ENCOUNTER — Telehealth: Payer: Self-pay | Admitting: Oncology

## 2024-11-21 ENCOUNTER — Other Ambulatory Visit: Payer: Self-pay | Admitting: Oncology

## 2024-11-21 ENCOUNTER — Other Ambulatory Visit: Payer: Self-pay

## 2024-11-21 VITALS — BP 134/80

## 2024-11-21 DIAGNOSIS — D631 Anemia in chronic kidney disease: Secondary | ICD-10-CM

## 2024-11-21 DIAGNOSIS — I130 Hypertensive heart and chronic kidney disease with heart failure and stage 1 through stage 4 chronic kidney disease, or unspecified chronic kidney disease: Secondary | ICD-10-CM | POA: Diagnosis not present

## 2024-11-21 DIAGNOSIS — D649 Anemia, unspecified: Secondary | ICD-10-CM

## 2024-11-21 LAB — RETIC PANEL
Immature Retic Fract: 25.3 % — ABNORMAL HIGH (ref 2.3–15.9)
RBC.: 2.21 MIL/uL — ABNORMAL LOW (ref 3.87–5.11)
Retic Count, Absolute: 110.1 K/uL (ref 19.0–186.0)
Retic Ct Pct: 5 % — ABNORMAL HIGH (ref 0.4–3.1)
Reticulocyte Hemoglobin: 30.7 pg (ref >27.9)

## 2024-11-21 LAB — CBC WITH DIFFERENTIAL (CANCER CENTER ONLY)
Abs Immature Granulocytes: 0.03 K/uL (ref 0.00–0.07)
Basophils Absolute: 0 K/uL (ref 0.0–0.1)
Basophils Relative: 1 %
Eosinophils Absolute: 0 K/uL (ref 0.0–0.5)
Eosinophils Relative: 0 %
HCT: 22 % — ABNORMAL LOW (ref 36.0–46.0)
Hemoglobin: 6.4 g/dL — CL (ref 12.0–15.0)
Immature Granulocytes: 1 %
Lymphocytes Relative: 7 %
Lymphs Abs: 0.4 K/uL — ABNORMAL LOW (ref 0.7–4.0)
MCH: 28.8 pg (ref 26.0–34.0)
MCHC: 29.1 g/dL — ABNORMAL LOW (ref 30.0–36.0)
MCV: 99.1 fL (ref 80.0–100.0)
Monocytes Absolute: 0.3 K/uL (ref 0.1–1.0)
Monocytes Relative: 5 %
Neutro Abs: 5.1 K/uL (ref 1.7–7.7)
Neutrophils Relative %: 86 %
Platelet Count: 174 K/uL (ref 150–400)
RBC: 2.22 MIL/uL — ABNORMAL LOW (ref 3.87–5.11)
RDW: 19.2 % — ABNORMAL HIGH (ref 11.5–15.5)
WBC Count: 5.8 K/uL (ref 4.0–10.5)
nRBC: 0 % (ref 0.0–0.2)

## 2024-11-21 LAB — IRON AND TIBC
Iron: 62 ug/dL (ref 28–170)
Saturation Ratios: 21 % (ref 10.4–31.8)
TIBC: 295 ug/dL (ref 250–450)
UIBC: 234 ug/dL

## 2024-11-21 LAB — SAMPLE TO BLOOD BANK

## 2024-11-21 LAB — PREPARE RBC (CROSSMATCH)

## 2024-11-21 LAB — FERRITIN: Ferritin: 433 ng/mL — ABNORMAL HIGH (ref 11–307)

## 2024-11-21 MED ADMIN — Epoetin Alfa-epbx Inj 40000 Unit/ML: 40000 [IU] | SUBCUTANEOUS | NDC 00069130901

## 2024-11-21 MED ADMIN — Epoetin Alfa-epbx Inj 20000 Unit/ML: 20000 [IU] | SUBCUTANEOUS | NDC 00069131101

## 2024-11-21 NOTE — Telephone Encounter (Signed)
 Jeanette Romero called and wanted to know if we had drawn her INR lab here. Because Dr Babara is not following her INR here, I explained she needed to go to her [facility] to have that lab drawn. She asked me to cancel her Blood Infusion tomorrow 11/22/24. [facility] called her and wants her to come there for her transfusion. Team was notified.

## 2024-11-22 ENCOUNTER — Inpatient Hospital Stay

## 2024-11-26 ENCOUNTER — Inpatient Hospital Stay

## 2024-11-26 LAB — TYPE AND SCREEN
ABO/RH(D): B POS
Antibody Screen: NEGATIVE
Unit division: 0

## 2024-11-26 LAB — BPAM RBC
Blood Product Expiration Date: 202601052359
Unit Type and Rh: 7300

## 2024-11-27 ENCOUNTER — Telehealth: Payer: Self-pay | Admitting: Oncology

## 2024-11-27 NOTE — Telephone Encounter (Signed)
 Pt no showed labs prior and is scheduled for blood transfusion on 12/18.   Per RN, call pt and see if she can come in today for labs/injection.  I called pt and spoke with her. She is at work and cannot come today. There is no room in infusion to do blood on Friday 12/19 /Monday 12/22 or Tuesday 12/23. Pt stated now yall are just messing up my schedule  I asked pt if we could do her lab/injection on Tuesday 12/23 and blood on 12/24. Pt stated she has an appt with her PCP on 12/23 and cannot do this date.   Per RN, if unable then just document.   I confirmed January appt with pt and changed the time of the appt per pt request as she has another appt this day as well.

## 2024-11-28 ENCOUNTER — Inpatient Hospital Stay

## 2024-12-01 ENCOUNTER — Ambulatory Visit: Payer: Self-pay | Admitting: Oncology

## 2024-12-02 ENCOUNTER — Other Ambulatory Visit: Payer: Self-pay

## 2024-12-02 DIAGNOSIS — N184 Chronic kidney disease, stage 4 (severe): Secondary | ICD-10-CM

## 2024-12-16 DIAGNOSIS — D631 Anemia in chronic kidney disease: Secondary | ICD-10-CM

## 2024-12-17 ENCOUNTER — Inpatient Hospital Stay

## 2024-12-17 ENCOUNTER — Inpatient Hospital Stay: Attending: Oncology

## 2024-12-17 VITALS — BP 82/49

## 2024-12-17 DIAGNOSIS — C50212 Malignant neoplasm of upper-inner quadrant of left female breast: Secondary | ICD-10-CM | POA: Diagnosis not present

## 2024-12-17 DIAGNOSIS — Z87891 Personal history of nicotine dependence: Secondary | ICD-10-CM | POA: Insufficient documentation

## 2024-12-17 DIAGNOSIS — Z923 Personal history of irradiation: Secondary | ICD-10-CM | POA: Insufficient documentation

## 2024-12-17 DIAGNOSIS — Z9012 Acquired absence of left breast and nipple: Secondary | ICD-10-CM | POA: Diagnosis not present

## 2024-12-17 DIAGNOSIS — D631 Anemia in chronic kidney disease: Secondary | ICD-10-CM | POA: Insufficient documentation

## 2024-12-17 DIAGNOSIS — N184 Chronic kidney disease, stage 4 (severe): Secondary | ICD-10-CM | POA: Insufficient documentation

## 2024-12-17 DIAGNOSIS — Z1732 Human epidermal growth factor receptor 2 negative status: Secondary | ICD-10-CM | POA: Diagnosis not present

## 2024-12-17 DIAGNOSIS — Z1721 Progesterone receptor positive status: Secondary | ICD-10-CM | POA: Diagnosis not present

## 2024-12-17 DIAGNOSIS — Z79811 Long term (current) use of aromatase inhibitors: Secondary | ICD-10-CM | POA: Insufficient documentation

## 2024-12-17 DIAGNOSIS — I130 Hypertensive heart and chronic kidney disease with heart failure and stage 1 through stage 4 chronic kidney disease, or unspecified chronic kidney disease: Secondary | ICD-10-CM | POA: Diagnosis present

## 2024-12-17 DIAGNOSIS — Z170 Estrogen receptor positive status [ER+]: Secondary | ICD-10-CM | POA: Insufficient documentation

## 2024-12-17 LAB — SAMPLE TO BLOOD BANK

## 2024-12-17 LAB — HEMOGLOBIN AND HEMATOCRIT (CANCER CENTER ONLY)
HCT: 24.4 % — ABNORMAL LOW (ref 36.0–46.0)
Hemoglobin: 7.3 g/dL — ABNORMAL LOW (ref 12.0–15.0)

## 2024-12-17 MED ORDER — EPOETIN ALFA-EPBX 20000 UNIT/ML IJ SOLN
20000.0000 [IU] | Freq: Once | INTRAMUSCULAR | Status: AC
  Administered 2024-12-17: 20000 [IU] via SUBCUTANEOUS
  Filled 2024-12-17: qty 1

## 2024-12-17 MED ORDER — EPOETIN ALFA-EPBX 40000 UNIT/ML IJ SOLN
40000.0000 [IU] | Freq: Once | INTRAMUSCULAR | Status: AC
  Administered 2024-12-17: 40000 [IU] via SUBCUTANEOUS
  Filled 2024-12-17: qty 1

## 2024-12-17 NOTE — Progress Notes (Signed)
 Patient here for lab/possible Retacrit  today.  BP in office today is 82/49.  Patient states she had recent hospital admission at [facility] for flu with discharge yesterday.  Admits weakness from recent hospitalization but no acute symptoms related to hypotension. Hgb 7.3 today and Dr. Babara advise she come for scheduled blood transfusion on 12/20/23.    Discussed with Dr. Babara, will proceed with retacrit  today with MD advise to proceed to ED for hypotension.  When patient informed of appt for blood transfusion later this week she states that her [facility] cardiologist wants her to have blood transfusions at [facility] due to high risk of fluid overload.  I informed Dr. Babara of patient's cardiology preference for blood transfusions.  Dr. Babara recommend patient to go to [facility] ED for possible blood transfusion.  Discussed MD recommendations to patient.  Jeanette Romero is unsure if she will go to ED since was just discharged yesterday but she will consider.

## 2024-12-19 ENCOUNTER — Inpatient Hospital Stay

## 2025-01-07 ENCOUNTER — Inpatient Hospital Stay

## 2025-01-07 DIAGNOSIS — I130 Hypertensive heart and chronic kidney disease with heart failure and stage 1 through stage 4 chronic kidney disease, or unspecified chronic kidney disease: Secondary | ICD-10-CM | POA: Diagnosis not present

## 2025-01-07 DIAGNOSIS — D631 Anemia in chronic kidney disease: Secondary | ICD-10-CM

## 2025-01-07 LAB — CBC WITH DIFFERENTIAL (CANCER CENTER ONLY)
Abs Immature Granulocytes: 0.02 10*3/uL (ref 0.00–0.07)
Basophils Absolute: 0 10*3/uL (ref 0.0–0.1)
Basophils Relative: 1 %
Eosinophils Absolute: 0.1 10*3/uL (ref 0.0–0.5)
Eosinophils Relative: 2 %
HCT: 26.1 % — ABNORMAL LOW (ref 36.0–46.0)
Hemoglobin: 7.5 g/dL — ABNORMAL LOW (ref 12.0–15.0)
Immature Granulocytes: 0 %
Lymphocytes Relative: 9 %
Lymphs Abs: 0.4 10*3/uL — ABNORMAL LOW (ref 0.7–4.0)
MCH: 29.6 pg (ref 26.0–34.0)
MCHC: 28.7 g/dL — ABNORMAL LOW (ref 30.0–36.0)
MCV: 103.2 fL — ABNORMAL HIGH (ref 80.0–100.0)
Monocytes Absolute: 0.4 10*3/uL (ref 0.1–1.0)
Monocytes Relative: 8 %
Neutro Abs: 4 10*3/uL (ref 1.7–7.7)
Neutrophils Relative %: 80 %
Platelet Count: 157 10*3/uL (ref 150–400)
RBC: 2.53 MIL/uL — ABNORMAL LOW (ref 3.87–5.11)
RDW: 21.5 % — ABNORMAL HIGH (ref 11.5–15.5)
WBC Count: 5 10*3/uL (ref 4.0–10.5)
nRBC: 0 % (ref 0.0–0.2)

## 2025-01-07 LAB — RETIC PANEL
Immature Retic Fract: 15.3 % (ref 2.3–15.9)
RBC.: 2.53 MIL/uL — ABNORMAL LOW (ref 3.87–5.11)
Retic Count, Absolute: 74.4 10*3/uL (ref 19.0–186.0)
Retic Ct Pct: 2.9 % (ref 0.4–3.1)
Reticulocyte Hemoglobin: 30.5 pg

## 2025-01-07 LAB — IRON AND TIBC
Iron: 46 ug/dL (ref 28–170)
Saturation Ratios: 16 % (ref 10.4–31.8)
TIBC: 286 ug/dL (ref 250–450)
UIBC: 240 ug/dL

## 2025-01-07 LAB — SAMPLE TO BLOOD BANK

## 2025-01-07 LAB — FERRITIN: Ferritin: 339 ng/mL — ABNORMAL HIGH (ref 11–307)

## 2025-01-09 ENCOUNTER — Inpatient Hospital Stay

## 2025-01-09 ENCOUNTER — Other Ambulatory Visit: Payer: Self-pay

## 2025-01-09 ENCOUNTER — Other Ambulatory Visit: Payer: Self-pay | Admitting: Oncology

## 2025-01-09 ENCOUNTER — Inpatient Hospital Stay: Admitting: Oncology

## 2025-01-09 ENCOUNTER — Encounter: Payer: Self-pay | Admitting: Oncology

## 2025-01-09 VITALS — BP 126/81 | HR 83 | Temp 97.9°F | Resp 18 | Wt 143.0 lb

## 2025-01-09 DIAGNOSIS — D649 Anemia, unspecified: Secondary | ICD-10-CM

## 2025-01-09 DIAGNOSIS — D631 Anemia in chronic kidney disease: Secondary | ICD-10-CM

## 2025-01-09 DIAGNOSIS — K921 Melena: Secondary | ICD-10-CM

## 2025-01-09 DIAGNOSIS — Z7901 Long term (current) use of anticoagulants: Secondary | ICD-10-CM | POA: Diagnosis not present

## 2025-01-09 DIAGNOSIS — C50919 Malignant neoplasm of unspecified site of unspecified female breast: Secondary | ICD-10-CM | POA: Diagnosis not present

## 2025-01-09 DIAGNOSIS — N6099 Unspecified benign mammary dysplasia of unspecified breast: Secondary | ICD-10-CM | POA: Diagnosis not present

## 2025-01-09 DIAGNOSIS — N184 Chronic kidney disease, stage 4 (severe): Secondary | ICD-10-CM | POA: Diagnosis not present

## 2025-01-09 DIAGNOSIS — Z8774 Personal history of (corrected) congenital malformations of heart and circulatory system: Secondary | ICD-10-CM | POA: Diagnosis not present

## 2025-01-09 DIAGNOSIS — I130 Hypertensive heart and chronic kidney disease with heart failure and stage 1 through stage 4 chronic kidney disease, or unspecified chronic kidney disease: Secondary | ICD-10-CM | POA: Diagnosis not present

## 2025-01-09 LAB — PREPARE RBC (CROSSMATCH)

## 2025-01-09 MED ORDER — EPOETIN ALFA-EPBX 20000 UNIT/ML IJ SOLN
20000.0000 [IU] | Freq: Once | INTRAMUSCULAR | Status: AC
  Administered 2025-01-09: 20000 [IU] via SUBCUTANEOUS
  Filled 2025-01-09: qty 1

## 2025-01-09 MED ORDER — EPOETIN ALFA-EPBX 40000 UNIT/ML IJ SOLN
40000.0000 [IU] | Freq: Once | INTRAMUSCULAR | Status: AC
  Administered 2025-01-09: 40000 [IU] via SUBCUTANEOUS
  Filled 2025-01-09: qty 1

## 2025-01-09 MED ORDER — ANASTROZOLE 1 MG PO TABS: ORAL_TABLET | ORAL | 1 refills | Status: AC

## 2025-01-09 NOTE — Assessment & Plan Note (Signed)
 Patient is on aromatase inhibitor.

## 2025-01-09 NOTE — Assessment & Plan Note (Signed)
 Labs are reviewed and discussed with patient. Lab Results  Component Value Date   HGB 7.5 (L) 01/07/2025   TIBC 286 01/07/2025   IRONPCTSAT 16 01/07/2025   FERRITIN 339 (H) 01/07/2025   Hb is trending down Recommend 1 unit of PRBC transfusion. Decrease interval of Retacrit  60,000 2 every 2 weeks if hemoglobin is less than 10.

## 2025-01-09 NOTE — Assessment & Plan Note (Signed)
 Left breast cancer, stage IA, pT1b N0 ER/PR positive, HER2 negative. Oncotype Dx was not sent due to patient being a poor candidate for chemotherapy due to multiple medical problems.She is not interested in chemotherapy as well Labs are reviewed and discussed with patient. Continue Arimidex  1mg  daily  Patient declines DEXA. Recommend calcium  and vitamin D supplementation annual diagnostic mammogram-due in February 2026

## 2025-01-09 NOTE — Assessment & Plan Note (Signed)
 Patient declined colonoscopy.  Continue monitoring hemoglobin, iron  panel.

## 2025-01-09 NOTE — Assessment & Plan Note (Signed)
 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   Patient declined colonoscopy.  Continue monitoring hemoglobin, iron  panel.

## 2025-01-09 NOTE — Progress Notes (Signed)
 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 Oncotype Dx was not sent due to patient being a poor candidate for chemotherapy due to multiple medical problems.She is not interested in chemotherapy as well Labs are reviewed and discussed with patient. Continue Arimidex  1mg  daily  Patient declines DEXA. Recommend calcium  and vitamin D supplementation annual diagnostic mammogram-due in February 2026   Anemia of chronic renal failure Labs are reviewed and discussed with patient. Lab Results  Component Value Date   HGB 7.5 (L) 01/07/2025   TIBC 286 01/07/2025   IRONPCTSAT 16 01/07/2025   FERRITIN 339 (H) 01/07/2025   Hb is trending down Recommend 1 unit of PRBC transfusion. Decrease interval of Retacrit  60,000 2 every 2 weeks if hemoglobin is less than 10.   Atypical lobular hyperplasia [facility]) of breast Patient is on aromatase inhibitor.    Chronic anticoagulation Patient is on Coumadin  for cardiology etiology. 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   Patient declined colonoscopy.  Continue monitoring hemoglobin, iron  panel. Orders Placed This Encounter  Procedures   CBC with Differential (Cancer Center Only)    Standing Status:   Future    Expected Date:   03/06/2025    Expiration Date:   06/04/2025   Iron  and TIBC    Standing Status:    Future    Expected Date:   03/06/2025    Expiration Date:   06/04/2025   Ferritin    Standing Status:   Future    Expected Date:   03/06/2025    Expiration Date:   06/04/2025   Retic Panel    Standing Status:   Future    Expected Date:   03/06/2025    Expiration Date:   06/04/2025   Hemoglobin and Hematocrit (Cancer Center Only)    Standing Status:   Standing    Number of Occurrences:   3    Expiration Date:   01/09/2026   Sample to Blood Bank    Standing Status:   Standing    Number of Occurrences:   3    Expiration Date:   01/09/2026   Follow up per LOS  All questions were answered. The patient knows to call the clinic with any problems, questions or concerns.  Call Babara, MD, PhD [facility] Hematology Oncology 01/09/2025    REASON FOR VISIT Follow up for breast cancer, anemia secondary to chronic kidney disease, chronic anticoagulation  HISTORY OF PRESENTING ILLNESS:  Patient presents for follow-up of breast cancer, anemia secondary to chronic kidney disease, chronic anticoagulation Oncology history summary listed as below. Oncology History  Invasive carcinoma of breast (HCC)  02/08/2022 Mammogram   Unilateral diagnostic mammogram /us  1. Suspicious mass left breast 10:30 o'clock. 2. Anterior to this suspicious mass on the true lateral view is an additional small lobular mass which is not visualized on ultrasound.     02/28/2022  Initial Diagnosis   Invasive carcinoma of breast (HCC)   03/14/2022 Surgery    patient is status post left breast lumpectomy Pathology showed left mammary carcinoma, no special type, 2 sentinel lymph nodes were excised and both were negative.  Margins are negative for invasive carcinoma. pT1b pN0.  Previous biopsy showed ER positive, PR positive HER2 negative   03/31/2022 Cancer Staging   Staging form: Breast, AJCC 8th Edition - Pathologic stage from 03/31/2022: Stage IA (pT1b, pN0, cM0, G1, ER+, PR+, HER2-) - Signed by Babara Call, MD on 03/31/2022 Stage  prefix: Initial diagnosis Multigene prognostic tests performed: None Histologic grading system: 3 grade system   04/25/2022 - 05/24/2022 Radiation Therapy   S/p adjuvant breast radiation    Genetic Testing   Patient declined genetic testing   05/26/2022 -  Anti-estrogen oral therapy   Started Arimidex      INTERVAL HISTORY 74 y.o. female  presents for follow-up for evaluation of breast cancer, anemia due to chronic kidney disease, chronic anticoagulation. Discussed the use of AI scribe software for clinical note transcription with the patient, who gave verbal consent to proceed.   Patient currently is on Arimidex  1mg  daily overall tolerates well.  Manageable side effects.   Multiple hospitalizations, most recent 01/01/2025 - 01/04/2025 at [facility] due to shortness of breath exacerbated with exertion.  Hemoglobin dropped to 6.7.  She has increased dark BM and uptrending BUN.  Suspect a slow bleeding AVM.  Patient declined colonoscopy due to cardiac risk..  Patient received PRBC transfusions.  + fatigue stable    Review of Systems  Constitutional:  Positive for malaise/fatigue. Negative for chills, fever and weight loss.  HENT:  Negative for sore throat.   Eyes:  Negative for redness.  Respiratory:  Negative for cough, shortness of breath and wheezing.   Cardiovascular:  Negative for chest pain, palpitations and leg swelling.  Gastrointestinal:  Negative for abdominal pain, blood in stool, nausea and vomiting.  Genitourinary:  Negative for dysuria.  Musculoskeletal:  Negative for myalgias.  Skin:  Negative for rash.  Neurological:  Negative for dizziness, tingling and tremors.  Endo/Heme/Allergies:  Does not bruise/bleed easily.  Psychiatric/Behavioral:  Negative for hallucinations.     MEDICAL HISTORY:  Past Medical History:  Diagnosis Date   (HFpEF) heart failure with preserved ejection fraction (HCC)    Anemia in chronic kidney disease (CKD) 11/14/2017   Aneurysm of  infrarenal abdominal aorta    a.) measured 2.3 x 2.8 cm on 11/15/2019   Aneurysm of left common iliac artery    a.) measured 1.9 x 1.9 on 11/15/2019   Anxiety    Aortic atherosclerosis    Aortic valve regurgitation    a.) severe; s/p placement of [employer]. Jude mechanical valve 09/19/2003   Arthritis    Breast cancer, left (HCC) 02/22/2022   a.) CNB 02/22/2022 (+) for invasive mammary carcinoma; stage IA (cT1b, cN0, cM0, G1, ER+, PR+, HER2-).   Cerebrovascular accident (CVA) due to embolism of other precerebral artery (HCC)    CKD (chronic kidney disease) stage 4, GFR 15-29 ml/min (HCC)    Coronary artery disease    a.) LHC 05/07/2015: 50% RPDA, 50% inf septal, 50% mLCx, 50% pLAD, 40% mRCA; intervention deferred opting for medical mgmt.   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 b.) s/p DCCV 2016 and TEE with cardioversion 09/2018. 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   a.) 28 mm Hemashield like graft    SURGICAL HISTORY: Past Surgical History:  Procedure Laterality Date   ABDOMINAL HYSTERECTOMY     AORTIC VALVE REPLACEMENT N/A 09/19/2003   BREAST BIOPSY Right 01/29/2021   US  Bx, 6:00 vision clip --> sclerosing intraductal papilloma; (-) for atypia and malignancy   BREAST BIOPSY Right  01/29/2021   US  Bx, 8:00 Ribbon clip --> usual ductal hyperplasia; (-) for atypia and malignancy   BREAST LUMPECTOMY Left 03/15/2022   CARDIAC CATHETERIZATION N/A 05/07/2015   Procedure: Left Heart Cath;  Surgeon: Denyse DELENA Bathe, MD;  Location: [facility] INVASIVE CV [facility];  Service: Cardiovascular;  Laterality: N/A;   COLONOSCOPY Left 02/20/2019   Procedure: COLONOSCOPY;  Surgeon: Janalyn Keene NOVAK, MD;  Location: [facility] ENDOSCOPY;  Service: Endoscopy;  Laterality: Left;   COLONOSCOPY N/A 02/21/2019   Procedure: COLONOSCOPY;  Surgeon: Janalyn Keene NOVAK, MD;  Location: [facility] ENDOSCOPY;  Service: Endoscopy;  Laterality: N/A;   ELECTROPHYSIOLOGIC STUDY N/A 05/05/2015   Procedure: Cardioversion;  Surgeon: Denyse DELENA Bathe, MD;  Location: [facility] ORS;  Service: Cardiovascular;  Laterality: N/A;   ENTEROSCOPY N/A 02/21/2019   Procedure: ENTEROSCOPY;  Surgeon: Janalyn Keene NOVAK, MD;  Location: [facility] ENDOSCOPY;  Service: Endoscopy;  Laterality: N/A;   ESOPHAGOGASTRODUODENOSCOPY Left 02/19/2019   Procedure: ESOPHAGOGASTRODUODENOSCOPY (EGD);  Surgeon: Janalyn Keene NOVAK, MD;  Location: [facility] ENDOSCOPY;  Service: Endoscopy;  Laterality: Left;   EXCISION OF BREAST BIOPSY Right 03/01/2021   Procedure: EXCISION OF BREAST BIOPSY w/ radiofrequency tag;  Surgeon: Rodolph Romano, MD;  Location: [facility] ORS;  Service: General;  Laterality: Right;   PARTIAL MASTECTOMY WITH NEEDLE LOCALIZATION AND AXILLARY SENTINEL LYMPH NODE BX Left 03/14/2022   Procedure: PARTIAL MASTECTOMY WITH Radio Frequency tag AND AXILLARY SENTINEL LYMPH NODE BX;  Surgeon: Rodolph Romano, MD;  Location: [facility] ORS;  Service: General;  Laterality: Left;   REPAIR OF ACUTE ASCENDING THORACIC AORTIC DISSECTION N/A 06/08/2003   Procedure: ASCENDING AORTIC DISSECTION REPAIR AND RESUSPENSION OF AORTIC VALVE (28 mm Hemashield like graft); Location: [facility]; Surgeon: Korene Dandy, MD   REPLACEMENT TOTAL KNEE BILATERAL     TEE WITH CARDIOVERSION   09/26/2018    SOCIAL HISTORY: Social History   Socioeconomic History   Marital status: Legally Separated    Spouse name: Not on file   Number of children: Not on file   Years of education: Not on file   Highest education level: Not on file  Occupational History   Not on file  Tobacco Use   Smoking status: Former    Current packs/day: 0.00    Average packs/day: 0.5 packs/day for 20.0 years (10.0 ttl pk-yrs)    Types: Cigarettes    Start date: 78    Quit date: 2004    Years since quitting: 22.0   Smokeless tobacco: Never  Vaping Use  Vaping status: Never Used  Substance and Sexual Activity   Alcohol use: Yes    Alcohol/week: 0.0 standard drinks of alcohol    Comment: occas   Drug use: No   Sexual activity: Not on file  Other Topics Concern   Not on file  Social History Narrative   Not on file   Social Drivers of Health   Tobacco Use: Medium Risk (01/09/2025)   Patient History    Smoking Tobacco Use: Former    Smokeless Tobacco Use: Never    Passive Exposure: Not on file  Financial Resource Strain: Low Risk  (01/02/2025)   Received from [facility]   Overall Financial Resource Strain (CARDIA)    Difficulty of Paying Living Expenses: Not hard at all  Recent Concern: Financial Resource Strain - Medium Risk (10/22/2024)   Received from [facility]   Overall Financial Resource Strain (CARDIA)    Difficulty of Paying Living Expenses: Somewhat hard  Food Insecurity: No Food Insecurity (01/02/2025)   Received from [facility]   Epic    Within the past 12 months, you worried that your food would run out before you got the money to buy more.: Never true    Within the past 12 months, the food you bought just didn't last and you didn't have money to get more.: Never true  Recent Concern: Food Insecurity - Food Insecurity Present (10/22/2024)   Received from [facility]   Epic    Within the past 12  months, you worried that your food would run out before you got the money to buy more.: Sometimes true    Within the past 12 months, the food you bought just didn't last and you didn't have money to get more.: Sometimes true  Transportation Needs: No Transportation Needs (01/02/2025)   Received from [facility] - Transportation    In the past 12 months, has lack of transportation kept you from medical appointments or from getting medications?: No    Lack of Transportation (Non-Medical): No  Physical Activity: Not on file  Stress: Not on file  Social Connections: Not on file  Intimate Partner Violence: Not on file  Depression (PHQ2-9): Low Risk (11/12/2024)   Depression (PHQ2-9)    PHQ-2 Score: 0  Alcohol Screen: Not on file  Housing: Low Risk  (01/02/2025)   Received from [facility]   Epic    In the last 12 months, was there a time when you were not able to pay the mortgage or rent on time?: No    In the past 12 months, how many times have you moved where you were living?: 0    At any time in the past 12 months, were you homeless or living in a shelter (including now)?: No  Utilities: Not At Risk (01/01/2025)   Received from [facility]   Epic    In the past 12 months has the electric, gas, oil, or water  company threatened to shut off services in your home?: No  Recent Concern: Utilities - At Risk (10/16/2024)   Received from [facility]   Epic    In the past 12 months has the electric, gas, oil, or water  company threatened to shut off services in your home?: Yes  Health Literacy: Not on file    FAMILY HISTORY: Family History  Problem Relation Age of Onset   Hypertension Mother  Colon cancer Mother    Prostate cancer Father    Kidney cancer Brother    Prostate cancer Brother    Prostate cancer Brother    Breast cancer Neg Hx     ALLERGIES:  is allergic to atorvastatin  and  quinolones.  MEDICATIONS:  Current Outpatient Medications  Medication Sig Dispense Refill   allopurinol (ZYLOPRIM) 100 MG tablet Take 100 mg by mouth at bedtime.     amiodarone  (PACERONE ) 200 MG tablet Take 200 mg by mouth daily.     anastrozole  (ARIMIDEX ) 1 MG tablet TAKE 1 TABLET(1 MG) BY MOUTH DAILY 90 tablet 1   diphenhydramine -acetaminophen  (TYLENOL  PM) 25-500 MG TABS tablet Take 1 tablet by mouth at bedtime as needed.     escitalopram (LEXAPRO) 10 MG tablet Take 10 mg by mouth daily.     folic acid  (FOLVITE ) 1 MG tablet TAKE 1 TABLET BY MOUTH DAILY 30 tablet 11   hydrALAZINE  (APRESOLINE ) 25 MG tablet Take 1 tablet by mouth 3 (three) times daily. (Patient taking differently: Take 1 tablet by mouth 3 (three) times daily.)     methimazole  (TAPAZOLE ) 5 MG tablet Take 5 mg by mouth daily.     Octreotide Acetate 100 MCG/ML SOSY Inject 100 mcg into the skin.     pantoprazole  (PROTONIX ) 20 MG tablet Take 1 tablet (20 mg total) by mouth daily. 30 tablet 2   rosuvastatin  (CRESTOR ) 5 MG tablet Take 5 mg by mouth at bedtime.  2   sodium bicarbonate  650 MG tablet Take 650 mg by mouth 2 (two) times daily.     torsemide (DEMADEX) 20 MG tablet Take 60 mg by mouth daily.     Vitamin D, Ergocalciferol, (DRISDOL) 1.25 MG (50000 UNIT) CAPS capsule Take 50,000 Units by mouth once a week.     warfarin (COUMADIN ) 4 MG tablet Take 4 mg by mouth daily.     amoxicillin (AMOXIL) 500 MG tablet Take 2,000 mg by mouth See admin instructions. Take 2000 mg by mouth one hour prior to dental procedures (Patient not taking: Reported on 01/09/2025)     No current facility-administered medications for this visit.     PHYSICAL EXAMINATION: ECOG PERFORMANCE STATUS: 1 - Symptomatic but completely ambulatory Vitals:   01/09/25 1055 01/09/25 1102  BP: (!) 132/99 126/81  Pulse: 83   Resp: 18   Temp: 97.9 F (36.6 C)   SpO2: 100%    Filed Weights   01/09/25 1055  Weight: 143 lb (64.9 kg)    Physical  Exam Constitutional:      General: She is not in acute distress.    Appearance: She is not diaphoretic.  HENT:     Head: Normocephalic and atraumatic.  Eyes:     General: No scleral icterus. Neck:     Vascular: No JVD.  Cardiovascular:     Rate and Rhythm: Normal rate. Rhythm irregular.     Heart sounds: Murmur heard.  Pulmonary:     Effort: Pulmonary effort is normal. No respiratory distress.     Breath sounds: No wheezing.  Abdominal:     General: Bowel sounds are normal. There is no distension.     Palpations: Abdomen is soft.     Tenderness: There is no abdominal tenderness.  Musculoskeletal:        General: No tenderness. Normal range of motion.     Cervical back: Normal range of motion and neck supple.     Right lower leg: Edema present.     Left  lower leg: Edema present.  Lymphadenopathy:     Cervical: No cervical adenopathy.  Skin:    General: Skin is warm and dry.     Findings: No erythema or rash.  Neurological:     Mental Status: She is alert and oriented to person, place, and time. Mental status is at baseline.     Motor: No abnormal muscle tone.  Psychiatric:        Mood and Affect: Mood and affect normal.     LABORATORY DATA:  I have reviewed the data as listed    Latest Ref Rng & Units 01/07/2025   10:46 AM 12/17/2024    1:12 PM 11/21/2024   10:14 AM  CBC  WBC 4.0 - 10.5 K/uL 5.0   5.8   Hemoglobin 12.0 - 15.0 g/dL 7.5  7.3  6.4   Hematocrit 36.0 - 46.0 % 26.1  24.4  22.0   Platelets 150 - 400 K/uL 157   174       Latest Ref Rng & Units 02/06/2024   10:51 AM 04/27/2023    1:37 PM 09/28/2022    1:49 PM  CMP  Glucose 70 - 99 mg/dL  90  92   BUN 8 - 23 mg/dL  66  32   Creatinine 9.55 - 1.00 mg/dL  7.47  8.13   Sodium 864 - 145 mmol/L  139  142   Potassium 3.5 - 5.1 mmol/L  5.9  3.7   Chloride 98 - 111 mmol/L  110  110   CO2 22 - 32 mmol/L  22  25   Calcium  8.9 - 10.3 mg/dL  9.7  9.7   Total Protein 6.5 - 8.1 g/dL 6.6  7.1  7.0   Total Bilirubin  0.0 - 1.2 mg/dL 0.5  0.5  0.5   Alkaline Phos 38 - 126 U/L 125  106  126   AST 15 - 41 U/L 17  30  16    ALT 0 - 44 U/L 19  31  18       Iron /TIBC/Ferritin/ %Sat    Component Value Date/Time   IRON  46 01/07/2025 1046   TIBC 286 01/07/2025 1046   FERRITIN 339 (H) 01/07/2025 1046   IRONPCTSAT 16 01/07/2025 1046                                                                                                                                                        RADIOGRAPHIC STUDIES: I have personally reviewed the radiological images as listed and agreed with the findings in the report. No results found.                                                                                                                                                                                                                                                                                                                                        "

## 2025-01-09 NOTE — Assessment & Plan Note (Signed)
 Patient is on Coumadin  for cardiology etiology. History of GI bleeding due to anticoagulation octreotide 100mcg BID SQ per [facility]

## 2025-01-10 ENCOUNTER — Inpatient Hospital Stay

## 2025-01-10 ENCOUNTER — Telehealth: Payer: Self-pay

## 2025-01-10 ENCOUNTER — Other Ambulatory Visit: Payer: Self-pay | Admitting: Oncology

## 2025-01-10 DIAGNOSIS — C50919 Malignant neoplasm of unspecified site of unspecified female breast: Secondary | ICD-10-CM

## 2025-01-10 DIAGNOSIS — D649 Anemia, unspecified: Secondary | ICD-10-CM

## 2025-01-10 DIAGNOSIS — I130 Hypertensive heart and chronic kidney disease with heart failure and stage 1 through stage 4 chronic kidney disease, or unspecified chronic kidney disease: Secondary | ICD-10-CM | POA: Diagnosis not present

## 2025-01-10 DIAGNOSIS — D631 Anemia in chronic kidney disease: Secondary | ICD-10-CM

## 2025-01-10 MED ORDER — ACETAMINOPHEN 325 MG PO TABS: 650.0000 mg | ORAL_TABLET | Freq: Once | ORAL | Status: DC

## 2025-01-10 MED ORDER — SODIUM CHLORIDE 0.9% IV SOLUTION
250.0000 mL | INTRAVENOUS | Status: DC
  Administered 2025-01-10: 250 mL via INTRAVENOUS
  Filled 2025-01-10: qty 250

## 2025-01-10 MED ORDER — DIPHENHYDRAMINE HCL 25 MG PO TABS: 25.0000 mg | ORAL_TABLET | Freq: Once | ORAL | Status: DC

## 2025-01-10 NOTE — Patient Instructions (Signed)
 Getting Blood Through an IV (Blood Transfusion) in Adults: What to Know After After a blood transfusion, it is common to have: Bruising and soreness at the IV site. A headache. Follow these instructions at home: Your doctor may give you more instructions. If you have problems, contact your doctor. Insertion site care     Follow instructions from your doctor about how to take care of your insertion site. This is where an IV tube was put into your vein. Make sure you: Wash your hands with soap and water  for at least 20 seconds before and after you change your bandage. If you cannot use soap and water , use hand sanitizer. Change your bandage as told by your doctor. Check your insertion site every day for signs of infection. Check for: Redness, swelling, or pain. Bleeding from the site. Warmth. Pus or a bad smell. General instructions Take over-the-counter and prescription medicines only as told by your doctor. Rest as told by your doctor. Go back to your normal activities as told by your doctor. Keep all follow-up visits. You may need to have tests at certain times to check your blood. Contact a doctor if: You have itching or red, swollen areas of skin (hives). You have a fever or chills. You have pain in the head, back, or chest. You feel worried or nervous (anxious). You feel weak after doing your normal activities. You have any of these problems at the insertion site: Redness, swelling, warmth, or pain. Bleeding that does not stop with pressure. Pus or a bad smell. If you received your blood transfusion in an outpatient setting, you will be told whom to contact to report any reactions. Get help right away if: You have signs of a serious reaction. This may be coming from an allergy or the body's defense system (immune system). Signs include: Trouble breathing or shortness of breath. Swelling of the face or feeling warm (flushed). A widespread rash. Dark pee (urine) or blood in  the pee. Fast heartbeat. These symptoms may be an emergency. Get help right away. Call 911. Do not wait to see if the symptoms will go away. Do not drive yourself to the hospital. Summary Bruising and soreness at the IV site are common. Check your insertion site every day for signs of infection. Rest as told by your doctor. Go back to your normal activities as told by your doctor. Get help right away if you have signs of a serious reaction. This information is not intended to replace advice given to you by your health care provider. Make sure you discuss any questions you have with your health care provider. Document Revised: 10/03/2024 Document Reviewed: 02/25/2022 Elsevier Patient Education  2025 [employer].

## 2025-01-10 NOTE — Telephone Encounter (Signed)
-----   Message from Zelphia Cap, MD sent at 01/09/2025  8:48 PM EST ----- Please also arrange patient to get bilateral diagnostic mammogram sometime in February 2026.  Thank you

## 2025-01-10 NOTE — Telephone Encounter (Signed)
 Mammo orders placed. Mychart message sent to pt.

## 2025-01-11 LAB — TYPE AND SCREEN
ABO/RH(D): B POS
Antibody Screen: NEGATIVE
Unit division: 0

## 2025-01-11 LAB — BPAM RBC
Blood Product Expiration Date: 202602232359
ISSUE DATE / TIME: 202601301339
Unit Type and Rh: 5100

## 2025-01-14 ENCOUNTER — Other Ambulatory Visit: Payer: Self-pay

## 2025-01-14 ENCOUNTER — Ambulatory Visit: Admission: RE | Admit: 2025-01-14 | Discharge: 2025-01-14 | Disposition: A | Source: Ambulatory Visit

## 2025-01-14 DIAGNOSIS — R1900 Intra-abdominal and pelvic swelling, mass and lump, unspecified site: Secondary | ICD-10-CM

## 2025-01-15 ENCOUNTER — Other Ambulatory Visit: Payer: Self-pay

## 2025-01-15 DIAGNOSIS — R1900 Intra-abdominal and pelvic swelling, mass and lump, unspecified site: Secondary | ICD-10-CM

## 2025-01-16 ENCOUNTER — Ambulatory Visit: Admission: RE | Admit: 2025-01-16 | Discharge: 2025-01-16

## 2025-01-16 DIAGNOSIS — R1900 Intra-abdominal and pelvic swelling, mass and lump, unspecified site: Secondary | ICD-10-CM

## 2025-01-23 ENCOUNTER — Inpatient Hospital Stay

## 2025-01-24 ENCOUNTER — Inpatient Hospital Stay

## 2025-01-28 ENCOUNTER — Encounter

## 2025-02-06 ENCOUNTER — Inpatient Hospital Stay

## 2025-02-07 ENCOUNTER — Inpatient Hospital Stay

## 2025-02-20 ENCOUNTER — Inpatient Hospital Stay

## 2025-02-21 ENCOUNTER — Inpatient Hospital Stay

## 2025-03-04 ENCOUNTER — Inpatient Hospital Stay

## 2025-03-06 ENCOUNTER — Inpatient Hospital Stay

## 2025-03-06 ENCOUNTER — Inpatient Hospital Stay: Admitting: Oncology
